# Patient Record
Sex: Female | Born: 1935 | Race: White | Hispanic: No | Smoking: Never smoker
Health system: Southern US, Community
[De-identification: ages and names within clinical notes are randomized; demographics above are authoritative.]

## PROBLEM LIST (undated history)

## (undated) DIAGNOSIS — F32A Depression, unspecified: Secondary | ICD-10-CM

## (undated) DIAGNOSIS — K219 Gastro-esophageal reflux disease without esophagitis: Secondary | ICD-10-CM

## (undated) DIAGNOSIS — Z9889 Other specified postprocedural states: Secondary | ICD-10-CM

## (undated) DIAGNOSIS — M353 Polymyalgia rheumatica: Secondary | ICD-10-CM

## (undated) DIAGNOSIS — T4145XA Adverse effect of unspecified anesthetic, initial encounter: Secondary | ICD-10-CM

## (undated) DIAGNOSIS — K5900 Constipation, unspecified: Secondary | ICD-10-CM

## (undated) DIAGNOSIS — M1990 Unspecified osteoarthritis, unspecified site: Secondary | ICD-10-CM

## (undated) DIAGNOSIS — T8859XA Other complications of anesthesia, initial encounter: Secondary | ICD-10-CM

## (undated) DIAGNOSIS — I10 Essential (primary) hypertension: Secondary | ICD-10-CM

## (undated) DIAGNOSIS — E039 Hypothyroidism, unspecified: Secondary | ICD-10-CM

## (undated) DIAGNOSIS — M810 Age-related osteoporosis without current pathological fracture: Secondary | ICD-10-CM

## (undated) DIAGNOSIS — F329 Major depressive disorder, single episode, unspecified: Secondary | ICD-10-CM

## (undated) DIAGNOSIS — R112 Nausea with vomiting, unspecified: Secondary | ICD-10-CM

## (undated) HISTORY — PX: TONSILLECTOMY: SUR1361

## (undated) HISTORY — PX: TRIGGER FINGER RELEASE: SHX641

## (undated) HISTORY — DX: Age-related osteoporosis without current pathological fracture: M81.0

## (undated) HISTORY — PX: NASAL SINUS SURGERY: SHX719

## (undated) HISTORY — PX: COLONOSCOPY W/ POLYPECTOMY: SHX1380

## (undated) HISTORY — DX: Major depressive disorder, single episode, unspecified: F32.9

## (undated) HISTORY — DX: Depression, unspecified: F32.A

## (undated) HISTORY — DX: Polymyalgia rheumatica: M35.3

## (undated) HISTORY — PX: UPPER GASTROINTESTINAL ENDOSCOPY: SHX188

## (undated) HISTORY — PX: EYE SURGERY: SHX253

---
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
## 1997-06-13 ENCOUNTER — Other Ambulatory Visit: Admission: RE | Admit: 1997-06-13 | Discharge: 1997-06-13 | Payer: Self-pay | Admitting: Gynecology

## 1998-06-17 ENCOUNTER — Other Ambulatory Visit: Admission: RE | Admit: 1998-06-17 | Discharge: 1998-06-17 | Payer: Self-pay | Admitting: Gynecology

## 1999-06-18 ENCOUNTER — Other Ambulatory Visit: Admission: RE | Admit: 1999-06-18 | Discharge: 1999-06-18 | Payer: Self-pay | Admitting: Gynecology

## 2000-06-27 ENCOUNTER — Other Ambulatory Visit: Admission: RE | Admit: 2000-06-27 | Discharge: 2000-06-27 | Payer: Self-pay | Admitting: Gynecology

## 2001-06-07 ENCOUNTER — Encounter: Payer: Self-pay | Admitting: Endocrinology

## 2001-06-07 ENCOUNTER — Encounter: Admission: RE | Admit: 2001-06-07 | Discharge: 2001-06-07 | Payer: Self-pay | Admitting: Endocrinology

## 2001-06-22 ENCOUNTER — Other Ambulatory Visit: Admission: RE | Admit: 2001-06-22 | Discharge: 2001-06-22 | Payer: Self-pay | Admitting: Gynecology

## 2001-08-01 ENCOUNTER — Encounter (INDEPENDENT_AMBULATORY_CARE_PROVIDER_SITE_OTHER): Payer: Self-pay | Admitting: *Deleted

## 2001-08-01 ENCOUNTER — Ambulatory Visit (HOSPITAL_COMMUNITY): Admission: RE | Admit: 2001-08-01 | Discharge: 2001-08-01 | Payer: Self-pay | Admitting: Rheumatology

## 2001-08-01 ENCOUNTER — Encounter: Payer: Self-pay | Admitting: Rheumatology

## 2001-08-07 ENCOUNTER — Encounter: Payer: Self-pay | Admitting: Rheumatology

## 2001-08-07 ENCOUNTER — Ambulatory Visit (HOSPITAL_COMMUNITY): Admission: RE | Admit: 2001-08-07 | Discharge: 2001-08-07 | Payer: Self-pay | Admitting: Rheumatology

## 2002-08-06 ENCOUNTER — Other Ambulatory Visit: Admission: RE | Admit: 2002-08-06 | Discharge: 2002-08-06 | Payer: Self-pay | Admitting: Gynecology

## 2003-01-04 ENCOUNTER — Ambulatory Visit (HOSPITAL_COMMUNITY): Admission: RE | Admit: 2003-01-04 | Discharge: 2003-01-04 | Payer: Self-pay | Admitting: Otolaryngology

## 2003-01-04 ENCOUNTER — Encounter (INDEPENDENT_AMBULATORY_CARE_PROVIDER_SITE_OTHER): Payer: Self-pay | Admitting: *Deleted

## 2003-01-04 ENCOUNTER — Ambulatory Visit (HOSPITAL_BASED_OUTPATIENT_CLINIC_OR_DEPARTMENT_OTHER): Admission: RE | Admit: 2003-01-04 | Discharge: 2003-01-04 | Payer: Self-pay | Admitting: Otolaryngology

## 2004-12-16 ENCOUNTER — Encounter: Admission: RE | Admit: 2004-12-16 | Discharge: 2005-01-08 | Payer: Self-pay | Admitting: Rheumatology

## 2005-03-01 HISTORY — PX: SHOULDER SURGERY: SHX246

## 2005-03-01 HISTORY — PX: SHOULDER ARTHROSCOPY: SHX128

## 2005-04-09 ENCOUNTER — Ambulatory Visit: Payer: Self-pay | Admitting: Internal Medicine

## 2005-04-27 ENCOUNTER — Encounter (INDEPENDENT_AMBULATORY_CARE_PROVIDER_SITE_OTHER): Payer: Self-pay | Admitting: Specialist

## 2005-04-27 ENCOUNTER — Ambulatory Visit: Payer: Self-pay | Admitting: Internal Medicine

## 2005-04-27 ENCOUNTER — Encounter (INDEPENDENT_AMBULATORY_CARE_PROVIDER_SITE_OTHER): Payer: Self-pay | Admitting: *Deleted

## 2005-10-14 ENCOUNTER — Encounter (INDEPENDENT_AMBULATORY_CARE_PROVIDER_SITE_OTHER): Payer: Self-pay | Admitting: Specialist

## 2005-10-14 ENCOUNTER — Ambulatory Visit (HOSPITAL_BASED_OUTPATIENT_CLINIC_OR_DEPARTMENT_OTHER): Admission: RE | Admit: 2005-10-14 | Discharge: 2005-10-15 | Payer: Self-pay | Admitting: Orthopedic Surgery

## 2006-10-14 ENCOUNTER — Emergency Department (HOSPITAL_COMMUNITY): Admission: EM | Admit: 2006-10-14 | Discharge: 2006-10-14 | Payer: Self-pay | Admitting: Family Medicine

## 2009-05-09 ENCOUNTER — Ambulatory Visit (HOSPITAL_COMMUNITY): Admission: RE | Admit: 2009-05-09 | Discharge: 2009-05-09 | Payer: Self-pay | Admitting: Rheumatology

## 2009-07-30 ENCOUNTER — Encounter: Admission: RE | Admit: 2009-07-30 | Discharge: 2009-07-30 | Payer: Self-pay | Admitting: Neurosurgery

## 2009-08-06 ENCOUNTER — Encounter (INDEPENDENT_AMBULATORY_CARE_PROVIDER_SITE_OTHER): Payer: Self-pay | Admitting: *Deleted

## 2009-09-19 ENCOUNTER — Ambulatory Visit: Payer: Self-pay | Admitting: Internal Medicine

## 2009-09-19 DIAGNOSIS — Z860100 Personal history of colon polyps, unspecified: Secondary | ICD-10-CM | POA: Insufficient documentation

## 2009-09-19 DIAGNOSIS — K644 Residual hemorrhoidal skin tags: Secondary | ICD-10-CM | POA: Insufficient documentation

## 2009-09-19 DIAGNOSIS — K5900 Constipation, unspecified: Secondary | ICD-10-CM | POA: Insufficient documentation

## 2009-09-19 DIAGNOSIS — R1319 Other dysphagia: Secondary | ICD-10-CM | POA: Insufficient documentation

## 2009-09-19 DIAGNOSIS — K219 Gastro-esophageal reflux disease without esophagitis: Secondary | ICD-10-CM | POA: Insufficient documentation

## 2009-09-29 ENCOUNTER — Telehealth: Payer: Self-pay | Admitting: Internal Medicine

## 2009-10-07 ENCOUNTER — Telehealth: Payer: Self-pay | Admitting: Internal Medicine

## 2009-12-01 ENCOUNTER — Ambulatory Visit: Payer: Self-pay | Admitting: Internal Medicine

## 2009-12-02 ENCOUNTER — Encounter: Payer: Self-pay | Admitting: Internal Medicine

## 2010-04-02 NOTE — Letter (Signed)
Summary: [facility] Instructions  [facility]  [address] [place], [place] [address]   Phone: [phone]  Fax: [phone]       Gloria Anderson    1933-11-21    MRN: [id number]        Procedure Day /Date:MONDAY, 11/17/09     Arrival Time:9:30 AM     Procedure Time:10:30 AM     Location of Procedure:                    X  [facility] (4th Floor)                        PREPARATION FOR COLONOSCOPY WITH MOVIPREP/ENDO   Starting 5 days prior to your procedure 9/14/11do not eat nuts, seeds, popcorn, corn, beans, peas,  salads, or any raw vegetables.  Do not take any fiber supplements (e.g. Metamucil, Citrucel, and Benefiber).  THE DAY BEFORE YOUR PROCEDURE         DATE:11/16/09 DAY: SUNDAY  1.  Drink clear liquids the entire day-NO SOLID FOOD  2.  Do not drink anything colored red or purple.  Avoid juices with pulp.  No orange juice.  3.  Drink at least 64 oz. (8 glasses) of fluid/clear liquids during the day to prevent dehydration and help the prep work efficiently.  CLEAR LIQUIDS INCLUDE: Water Jello Ice Popsicles Tea (sugar ok, no milk/cream) Powdered fruit flavored drinks Coffee (sugar ok, no milk/cream) Gatorade Juice: apple, white grape, white cranberry  Lemonade Clear bullion, consomm, broth Carbonated beverages (any kind) Strained chicken noodle soup Hard Candy                             4.  In the morning, mix first dose of MoviPrep solution:    Empty 1 Pouch A and 1 Pouch B into the disposable container    Add lukewarm drinking water to the top line of the container. Mix to dissolve    Refrigerate (mixed solution should be used within 24 hrs)  5.  Begin drinking the prep at 5:00 p.m. The MoviPrep container is divided by 4 marks.   Every 15 minutes drink the solution down to the next mark (approximately 8 oz) until the full liter is complete.   6.  Follow completed prep with 16 oz of clear liquid of your choice (Nothing  red or purple).  Continue to drink clear liquids until bedtime.  7.  Before going to bed, mix second dose of MoviPrep solution:    Empty 1 Pouch A and 1 Pouch B into the disposable container    Add lukewarm drinking water to the top line of the container. Mix to dissolve    Refrigerate  THE DAY OF YOUR PROCEDURE      DATE: 11/17/09 DAY: MONDAY  Beginning at 5:30 a.m. (5 hours before procedure):         1. Every 15 minutes, drink the solution down to the next mark (approx 8 oz) until the full liter is complete.  2. Follow completed prep with 16 oz. of clear liquid of your choice.    3. You may drink clear liquids until 8:30 AM (2 HOURS BEFORE PROCEDURE).   MEDICATION INSTRUCTIONS  Unless otherwise instructed, you should take regular prescription medications with a small sip of water   as early as possible the morning of your procedure.  OTHER INSTRUCTIONS  You will need a responsible adult at least 75 years of age to accompany you and drive you home.   This person must remain in the waiting room during your procedure.  Wear loose fitting clothing that is easily removed.  Leave jewelry and other valuables at home.  However, you may wish to bring a book to read or  an iPod/MP3 player to listen to music as you wait for your procedure to start.  Remove all body piercing jewelry and leave at home.  Total time from sign-in until discharge is approximately 2-3 hours.  You should go home directly after your procedure and rest.  You can resume normal activities the  day after your procedure.  The day of your procedure you should not:   Drive   Make legal decisions   Operate machinery   Drink alcohol   Return to work  You will receive specific instructions about eating, activities and medications before you leave.    The above instructions have been reviewed and explained to me by   _______________________    I fully understand and can verbalize these  instructions _____________________________ Date _________

## 2010-04-02 NOTE — Procedures (Signed)
Summary: Colonoscopy   Colonoscopy  Procedure date:  04/27/2005  Findings:      Location:  [facility].  Results: Polyp.  Tubular Adenoma Results: Hemorrhoids.       Procedures Next Due Date:    Colonoscopy: 04/2010  Colonoscopy  Procedure date:  04/27/2005  Findings:      Location:  [facility].  Results: Polyp.  Tubular Adenoma Results: Hemorrhoids.       Procedures Next Due Date:    Colonoscopy: 04/2010 Patient Name: Gloria, Anderson MRN:  Procedure Procedures: Colonoscopy CPT: 55732.    with polypectomy. CPT: A3573898.  Personnel: Endoscopist: Wilhemina Bonito. Marina Goodell, MD.  Referred By: Adrian Prince, MD.  Exam Location: Exam performed in Outpatient Clinic. Outpatient  Patient Consent: Procedure, Alternatives, Risks and Benefits discussed, consent obtained, from patient. Consent was obtained by the RN.  Indications  Average Risk Screening Routine.  History  Current Medications: Patient is not currently taking Coumadin.  Pre-Exam Physical: Performed Apr 27, 2005. Cardio-pulmonary exam, Rectal exam, Abdominal exam, Mental status exam WNL.  Comments: Pt. history reviewed/updated, physical exam performed prior to initiation of sedation? yes Exam Exam: Extent of exam reached: Cecum, extent intended: Cecum.  The cecum was identified by appendiceal orifice and IC valve. Patient position: on left side. The Cecum was reached at 8:28 AM. ended at 8:40 AM. Time for Withdrawl: 00:12. Colon retroflexion performed. Images taken. ASA Classification: II. Tolerance: excellent.  Monitoring: Pulse and BP monitoring, Oximetry used. Supplemental O2 given.  Colon Prep Used Miralax for colon prep. Prep results: excellent.  Sedation Meds: Patient assessed and found to be appropriate for moderate (conscious) sedation. Fentanyl 75 mcg. given IV. Versed 6 mg. given IV.  Findings NORMAL EXAM: Cecum to Rectum.  MULTIPLE POLYPS: Cecum to Sigmoid Colon.  minimum size 1 mm, maximum size 4 mm. Procedure:  snare without cautery, removed, Polyp retrieved, 3 polyps Polyps sent to pathology. ICD9: Colon Polyps: 211.3. Comments: 5 seen and removed (cecum 20mm,2mm,3mm; trans 2mm; sig 4mm).   Assessment  Diagnoses: 211.3: Colon Polyps.  455.0: Hemorrhoids, Internal.   Events  Unplanned Interventions: No intervention was required.  Unplanned Events: There were no complications. Plans Disposition: After procedure patient sent to recovery. After recovery patient sent home.  Scheduling/Referral: Colonoscopy, to Wilhemina Bonito. Marina Goodell, MD, in 5 years,    This report was created from the original endoscopy report, which was reviewed and signed by the above listed endoscopist.   cc:  Adrian Prince, MD      The Patient

## 2010-04-02 NOTE — Letter (Signed)
Summary: New Patient letter  [facility]  [address] [place], [place] [address]   Phone: [phone]  Fax: [phone]       08/06/2009 MRN: [id number]  Gloria Anderson [address] [place], [place]  [address]  Dear Gloria Anderson,  Welcome to the Gastroenterology Division at [facility].    You are scheduled to see Dr. Marina Goodell on 09/19/2009 at 1:45PM on the 3rd floor at [facility], [address]  We ask that you try to arrive at our office 15 minutes prior to your appointment time to allow for check-in.  We would like you to complete the enclosed self-administered evaluation form prior to your visit and bring it with you on the day of your appointment.  We will review it with you.  Also, please bring a complete list of all your medications or, if you prefer, bring the medication bottles and we will list them.  Please bring your insurance card so that we may make a copy of it.  If your insurance requires a referral to see a specialist, please bring your referral form from your primary care physician.  Co-payments are due at the time of your visit and may be paid by cash, check or credit card.     Your office visit will consist of a consult with your physician (includes a physical exam), any laboratory testing he/she may order, scheduling of any necessary diagnostic testing (e.g. x-ray, ultrasound, CT-scan), and scheduling of a procedure (e.g. Endoscopy, Colonoscopy) if required.  Please allow enough time on your schedule to allow for any/all of these possibilities.    If you cannot keep your appointment, please call [phone] to cancel or reschedule prior to your appointment date.  This allows [place] the opportunity to schedule an appointment for another patient in need of care.  If you do not cancel or reschedule by 5 p.m. the business day prior to your appointment date, you will be charged a $50.00 late cancellation/no-show fee.    Thank you for  choosing [facility] for your medical needs.  We appreciate the opportunity to care for you.  Please visit [place] at our website  to learn more about our practice.                     Sincerely,                                                             The Gastroenterology Division

## 2010-04-02 NOTE — Procedures (Signed)
Summary: Upper Endoscopy  Patient: [name] Note: All result statuses are Final unless otherwise noted.  Tests: (1) Upper Endoscopy (EGD)   EGD Upper Endoscopy       DONE     [facility]     [address]     [place], [place]  [address]           ENDOSCOPY PROCEDURE REPORT           PATIENT:  Gloria Anderson, Rottman  MR#:  [id number]     BIRTHDATE:  April 26, 1933, 75 yrs. old  GENDER:  female           ENDOSCOPIST:  Wilhemina Bonito. Eda Keys, MD     Referred by:  Office           PROCEDURE DATE:  12/01/2009     PROCEDURE:  EGD, diagnostic     ASA CLASS:  Class II     INDICATIONS:  vague dysphagia,scratchy feeling in throat and raspy     voice (all improved or resolved on PPI initiated 09-19-09); GERD           MEDICATIONS:   There was residual sedation effect present from     prior procedure., Fentanyl 25 mcg IV, Versed 2 mg IV     TOPICAL ANESTHETIC:  Exactacain Spray           DESCRIPTION OF PROCEDURE:   After the risks benefits and     alternatives of the procedure were thoroughly explained, informed     consent was obtained.  The LB GIF-H180 [id number] endoscope was     introduced through the mouth and advanced to the second portion of     the duodenum, without limitations.  The instrument was slowly     withdrawn as the mucosa was fully examined.     <<PROCEDUREIMAGES>>           The upper, middle, and distal third of the esophagus were     carefully inspected and no abnormalities were noted. The z-line     was well seen at the GEJ. The endoscope was pushed into the fundus     which was normal including a retroflexed view. The antrum,gastric     body, first and second part of the duodenum were unremarkable.     Retroflexed views revealed no abnormalities.    The scope was then     withdrawn from the patient and the procedure completed.           COMPLICATIONS:  None           ENDOSCOPIC IMPRESSION:     1) Normal EGD     2) GERD     RECOMMENDATIONS:     1)  Anti-reflux regimen to be followed     2) Continue Omeprazole     3) follow up as needed           ______________________________     Wilhemina Bonito. Eda Keys, MD           CC:  Adrian Prince, MD, The Patient           n.     eSIGNED:   Wilhemina Bonito. Eda Keys at 12/01/2009 10:47 AM           Sheffer, Randa Evens, [id number]  Note: An exclamation mark (!) indicates a result that was not dispersed into the flowsheet. Document Creation Date: 12/01/2009 10:48 AM _______________________________________________________________________  (1) Order result status: Final Collection or  observation date-time: 12/01/2009 10:40 Requested date-time:  Receipt date-time:  Reported date-time:  Referring Physician:   Ordering Physician: Fransico Setters [phone]) Specimen Source:  Source: Launa Grill Order Number: [id number] Lab site:

## 2010-04-02 NOTE — Letter (Signed)
Summary: Patient Notice- Polyp Results  [facility]  [address] [place], [place] [address]   Phone: [phone]  Fax: [phone]        December 02, 2009 MRN: [id number]    CYRAH MCLAMB [address] [place], [place]  [address]    Dear Ms. Rahe,  I am pleased to inform you that the colon polyp(s) removed during your recent colonoscopy was (were) found to be benign (no cancer detected) upon pathologic examination.  I recommend you have a repeat colonoscopy examination in 5 years to look for recurrent polyps, as having colon polyps increases your risk for having recurrent polyps or even colon cancer in the future.  Should you develop new or worsening symptoms of abdominal pain, bowel habit changes or bleeding from the rectum or bowels, please schedule an evaluation with either your primary care physician or with me.  Additional information/recommendations:  __ No further action with gastroenterology is needed at this time. Please      follow-up with your primary care physician for your other healthcare      needs.  _ Please call us if you are having persistent problems or have questions about your condition that have not been fully answered at this time.  Sincerely,  Hilarie Fredrickson MD  This letter has been electronically signed by your physician.  Appended Document: Patient Notice- Polyp Results letter mailed

## 2010-04-02 NOTE — Assessment & Plan Note (Signed)
Summary: DYSPHAGIA, PILLS SPECIFICALLY.Marland Kitchen   History of Present Illness Visit Type: Initial Consult Primary GI MD: Yancey Flemings MD Primary Provider: Adrian Prince, MD Requesting Provider: Adrian Prince, MD Chief Complaint: dysphagia, specifically pills;also a sore scratchy throat, hoarseness x 4 months History of Present Illness:   77 year old female with polymyalgia rheumatica, hypertension, hypothyroidism, and adenomatous colon polyps. She is sent today by Dr. Evlyn Kanner regarding laryngeal complaints, dysphagia, and the need for upper endoscopy. She reports being in her usual state of health until March 2011 when she began to notice a sensation of swollen glands in her neck. She was seen by 2 physicians who could not confirm such. Since that time she has also noticed intermittent dysphagia to pills, and the cervical region, as well as intermittent raspiness of her voice. She also notices intermittently, left ear pain. She has experienced several episodes of rather severe reflux. She is on no regular reflux medications. She has lost 4-5 pounds over the past month, but she states this is intentional as she is planning a trip to [place]. The patient also has a history of multiple adenomatous colon polyps on index exam in February 2007. She is due for followup in 6 months. She asks to have this performed concurrent with endoscopy, if endoscopy needed. Next, she mentions problems with constipation which she manages with stool softeners. She wonders if this is okay. She also mentions problems with external hemorrhoids occasionally. Specifically, irritation and minor bleeding. She has use Preparation H with variable results. She requires about other therapies.   GI Review of Systems    Reports acid reflux, bloating, dysphagia with solids, heartburn, and  weight loss.      Denies abdominal pain, belching, chest pain, dysphagia with liquids, loss of appetite, nausea, vomiting, vomiting blood, and  weight gain.     Reports constipation, hemorrhoids, and  rectal bleeding.     Denies anal fissure, black tarry stools, change in bowel habit, diarrhea, diverticulosis, fecal incontinence, heme positive stool, irritable bowel syndrome, jaundice, light color stool, liver problems, and  rectal pain. Preventive Screening-Counseling & Management  Alcohol-Tobacco     Smoking Status: never      Drug Use:  no.      Current Medications (verified): 1)  Multivitamins  Tabs (Multiple Vitamin) .Marland Kitchen.. 1 By Mouth Once Daily 2)  Synthroid 50 Mcg Tabs (Levothyroxine Sodium) .Marland Kitchen.. 1 Tablet By Mouth Once Daily 3)  Prednisone 5 Mg Tabs (Prednisone) .... As Directed 4)  Aspirin 81 Mg Tbec (Aspirin) .Marland Kitchen.. 1 By Mouth Once Daily 5)  Plaquenil 200 Mg Tabs (Hydroxychloroquine Sulfate) .Marland Kitchen.. 1 Tablet By Mouth Once Daily 6)  Fish Oil 1000 Mg Caps (Omega-3 Fatty Acids) .Marland Kitchen.. 1 By Mouth Once Daily 7)  Magnesium 250 Mg Tabs (Magnesium) .Marland Kitchen.. 1 Tablet By Mouth Once Daily 8)  Vitamin D 1000 Unit Tabs (Cholecalciferol) .Marland Kitchen.. 1 By Mouth Once Daily 9)  Calcium 600 Mg Tabs (Calcium) .Marland Kitchen.. 1 By Mouth Two Times A Day 10)  Benazepril Hcl 10 Mg Tabs (Benazepril Hcl) .... Once Daily 11)  Plaquenil 200 Mg Tabs (Hydroxychloroquine Sulfate) .... Take 1 Tablet  Every Morning and 1/2  Every Evening 12)  Garlic Oil 2000 Mg Caps (Garlic) .... Once Daily 13)  Stool Softener 100 Mg Caps (Docusate Sodium) .... Take 2 Caps Daily 14)  Glucosamine 500 Mg Caps (Glucosamine Sulfate) .... Take 1500 Mg Once Daily 15)  Advil Pm 200-38 Mg Tabs (Ibuprofen-Diphenhydramine Cit) .... As Needed  Allergies (verified): No Known Drug Allergies  Past History:  Past Medical History: Reviewed history from 09/17/2009 and no changes required. Hemorrhoids Colon Polyps-Tubular Adenoma Anemia Hypothyroidism Hypertension Polymalgia Rheumatica  Past Surgical History: Cataract Extraction Rotator Cuff Repair Sinus Surgery  Family History: Lung cancer: Brother Family  History of Diabetes: Brother TB: Mother Family History of Heart Disease: Father  Social History: Occupation: Retired Patient has never smoked.  Alcohol Use - no Illicit Drug Use - no Smoking Status:  never Drug Use:  no  Review of Systems       The patient complains of back pain and voice change.  The patient denies allergy/sinus, anemia, anxiety-new, arthritis/joint pain, blood in urine, breast changes/lumps, change in vision, confusion, cough, coughing up blood, depression-new, fainting, fatigue, fever, headaches-new, hearing problems, heart murmur, heart rhythm changes, itching, menstrual pain, muscle pains/cramps, night sweats, nosebleeds, pregnancy symptoms, shortness of breath, skin rash, sleeping problems, sore throat, swelling of feet/legs, swollen lymph glands, thirst - excessive , urination - excessive , urination changes/pain, urine leakage, and vision changes.    Vital Signs:  Patient profile:   77 year old female Height:      64 inches Weight:      124 pounds BMI:     21.36 Pulse rate:   88 / minute Pulse rhythm:   regular BP sitting:   120 / 60  (left arm) Cuff size:   regular  Vitals Entered By: June McMurray CMA Duncan Dull) (September 19, 2009 1:40 PM)  Physical Exam  General:  Well developed, well nourished, no acute distress. Head:  Normocephalic and atraumatic. Eyes:  PERRLA, no icterus. Ears:  Normal auditory acuity. Nose:  No deformity, discharge,  or lesions. Mouth:  No deformity or lesions Neck:  Supple; no masses or thyromegaly. Lungs:  Clear throughout to auscultation. Heart:  Regular rate and rhythm; no murmurs, rubs,  or bruits. Abdomen:  Soft, nontender and nondistended. No masses, hepatosplenomegaly or hernias noted. Normal bowel sounds. Rectal:  deferred until colonoscopy Msk:  Symmetrical with no gross deformities. Normal posture. Pulses:  Normal pulses noted. Extremities:  No clubbing, cyanosis, edema or deformities noted. Neurologic:  Alert and   oriented x4 Skin:  Intact without significant lesions or rashes. Cervical Nodes:  No significant cervical adenopathy.no supraclavicular adenopathy Psych:  Alert and cooperative. Normal mood and affect.   Impression & Recommendations:  Problem # 1:  DYSPHAGIA (JXB-147.82) minor intermittent pill dysphagia. Rule out stricture  Plan: #1. Upper endoscopy. The nature of the procedure as well as the risks, benefits, and alternatives have been reviewed. She understood and agreed to proceed  Problem # 2:  GERD (ICD-530.81) intermittent GERD symptoms. This could possibly explain intermittent problems with raspy voice.  Plan: #1. Initiate empiric PPI therapy. Samples of AcipHex 20 mg daily given #2. Upper endoscopy #3. If symptoms persist despite PPI and upper endoscopy negative, consider ENT evaluation given ear pain and raspy voice  Problem # 3:  CONSTIPATION (ICD-564.00) functional constipation ongoing  Plan: #1. Increase dietary fiber and water #2. Okay to use stool softeners  Problem # 4:  HEMORRHOIDS-EXTERNAL (ICD-455.3) intermittent problems with hemorrhoids as manifested by irritation minor bleeding.  Plan: #1. Brochure on hemorrhoidal care #2. Increase fiber #3. Prescribe Analpram cream p.r.n.  Problem # 5:  PERSONAL HISTORY OF COLONIC POLYPS (ICD-V12.72) personal history of adenomatous colon polyps. Just about due for routine followup. We will allow her to schedule her colonoscopy along with her upper endoscopy for her convenience. The nature of colonoscopy as well as risks, benefits, and  alternatives were reviewed. She understood and agreed to proceed. Movi prep prescribed. The patient instructed on its use  Other Orders: Colon/Endo (Colon/Endo)  Patient Instructions: 1)  Colon/Endo  LEC 11/17/09 10:30 am 2)  Arrive at 9:30 am 3)  Movi prep instructions given. 4)  Movi prep Rx. sent to pharmacy. 5)  Colonoscopy and Flexible Sigmoidoscopy brochure given.  6)   Hemorrhoids brochure given.  7)  Upper Endoscopy brochure given.  8)  Copy sent to : Adrian Prince, MD 9)  analpram cream to apply to rectum two times a day  Rx. sent to pharmacy. 10)  The medication list was reviewed and reconciled.  All changed / newly prescribed medications were explained.  A complete medication list was provided to the patient / caregiver. Prescriptions: ANALPRAM E 2.5-1 & 1 % KIT (HYDROCORTISONE ACE-PRAMOXINE) apply to rectum two times a day  #1 x 0   Entered by:   Milford Cage NCMA   Authorized by:   Hilarie Fredrickson MD   Signed by:   Milford Cage NCMA on 09/19/2009   Method used:   Electronically to        [facility]* (retail)       [address]       [place], [place]  [id number]       Ph: [phone]       Fax: [phone]   RxID:   [id number] MOVIPREP 100 GM  SOLR (PEG-KCL-NACL-NASULF-NA ASC-C) As per prep instructions.  #1 x 0   Entered by:   Milford Cage NCMA   Authorized by:   Hilarie Fredrickson MD   Signed by:   Milford Cage NCMA on 09/19/2009   Method used:   Electronically to        [facility]* (retail)       [address]       [place], [place]  [id number]       Ph: [phone]       Fax: [phone]   RxID:   [id number]

## 2010-04-02 NOTE — Progress Notes (Signed)
Summary: Medication  Phone Note Call from Patient Call back at Work Phone [phone]   Caller: Patient Call For: Dr. Marina Goodell Reason for Call: Talk to Nurse Summary of Call: pt. ran out of her Aciphex...does he want her to continue taking the meds. She is leaving for [place] in a few days and her symptoms are returning Initial call taken by: Karna Christmas,  October 07, 2009 12:51 PM  Follow-up for Phone Call        Pt. is going to use Prilosec OTC while on her trip as she says Aciphex is too expensive .Says she wiil see what her endo shows and then if GERD confirmed she will decide if she wants to pay for Aciphex. Follow-up by: Teryl Lucy RN,  October 07, 2009 1:57 PM  Additional Follow-up for Phone Call Additional follow up Details #1::        ok Additional Follow-up by: Hilarie Fredrickson MD,  October 10, 2009 1:00 PM

## 2010-04-02 NOTE — Procedures (Signed)
Summary: Colonoscopy  Patient: Gloria Anderson Note: All result statuses are Final unless otherwise noted.  Tests: (1) Colonoscopy (COL)   COL Colonoscopy           DONE     [facility]     [address]     [place], [place]  [address]           COLONOSCOPY PROCEDURE REPORT           PATIENT:  Gloria Anderson, Gloria Anderson  MR#:  [id number]     BIRTHDATE:  10-10-1933, 75 yrs. old  GENDER:  female     ENDOSCOPIST:  Wilhemina Bonito. Eda Keys, MD     REF. BY:  Surveillance Program Recall,     PROCEDURE DATE:  12/01/2009     PROCEDURE:  Colonoscopy with snare polypectomy x 4     ASA CLASS:  Class II     INDICATIONS:  history of pre-cancerous (adenomatous) colon polyps,     surveillance and high-risk screening ; index exam 04-2005 w/ 5     small adenomas     MEDICATIONS:   Fentanyl 75 mcg IV, Versed 8 mg IV           DESCRIPTION OF PROCEDURE:   After the risks benefits and     alternatives of the procedure were thoroughly explained, informed     consent was obtained.  Digital rectal exam was performed and     revealed no abnormalities.   The LB CF-[id number] endoscope     was introduced through the anus and advanced to the cecum, which     was identified by both the appendix and ileocecal valve, without     limitations.Time to cecum = 3:09 min. The quality of the prep was     excellent, using MoviPrep.  The instrument was then slowly     withdrawn (time = 14:29 min) as the colon was fully examined.     <<PROCEDUREIMAGES>>           FINDINGS:  Four polyps were found - 1mm, 5mm in the cecum; 2mm     ascending; and 3mm transverse. Polyps were snared without cautery.     Retrieval was successful  in 3 of 4. Mild diverticulosis was found     in the sigmoid colon.  This was otherwise a normal examination of     the colon.   Retroflexed views in the rectum revealed moderate     internal hemorrhoids.    The scope was then withdrawn from the     patient and the procedure completed.         COMPLICATIONS:  None     ENDOSCOPIC IMPRESSION:     1) Four small polyps - removed     2) Mild diverticulosis in the sigmoid colon     3) Otherwise normal examination     4) Internal hemorrhoids           RECOMMENDATIONS:     1) Follow up colonoscopy in 5 years           ______________________________     Wilhemina Bonito. Eda Keys, MD           CC:  Adrian Prince, MD;The Patient           n.     Rosalie DoctorWilhemina Bonito. Eda Keys at 12/01/2009 10:35 AM           Terlecki, Randa Evens, [id number]  Note: An exclamation  mark (!) indicates a result that was not dispersed into the flowsheet. Document Creation Date: 12/01/2009 10:35 AM _______________________________________________________________________  (1) Order result status: Final Collection or observation date-time: 12/01/2009 10:28 Requested date-time:  Receipt date-time:  Reported date-time:  Referring Physician:   Ordering Physician: Fransico Setters [phone]) Specimen Source:  Source: Launa Grill Order Number: [id number] Lab site:   Appended Document: Colonoscopy     Procedures Next Due Date:    Colonoscopy: 11/2014

## 2010-04-02 NOTE — Progress Notes (Signed)
Summary: Triage  Phone Note Call from Patient Call back at Home Phone [phone]   Caller: Patient Call For: Dr. Marina Goodell Reason for Call: Talk to Nurse Summary of Call: pt.'s hemorroids is better...should she stop the analpram Initial call taken by: Karna Christmas,  September 29, 2009 9:35 AM  Follow-up for Phone Call        Says symptoms completly resolved  told it was ordered p.r.n. so she should stop cream and re-start if  symptoms re-occur. Follow-up by: Teryl Lucy RN,  September 29, 2009 10:22 AM

## 2010-07-17 NOTE — Op Note (Signed)
NAME:  Gloria Anderson, Gloria Anderson                      ACCOUNT NO.:  [id number]   MEDICAL RECORD NO.:  [id number]                   PATIENT TYPE:  AMB   LOCATION:  DSC                                  FACILITY:  [facility]   PHYSICIAN:  Christopher E. Ezzard Standing, M.D.         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   FINDINGS:  Consistent with right maxillary fungal sinusitis.   OPERATION:  Functional endoscopic sinus surgery with anterior right  ethmoidectomy, right maxillary ostial enlargement with removal  of fungal  debris from the right maxillary sinus.   SURGEON:  Kristine Garbe. Ezzard Standing, M.D.   ANESTHESIA:  General endotracheal anesthesia.   COMPLICATIONS:  None.   INDICATIONS FOR PROCEDURE:  Gloria Anderson is a 77 year old female  who  has had chronic  right-sided sinus discomfort  and pain over the last  several months with repeated CT scan. She has a completely opacified right  maxillary sinus and minimal opacification of the right anterior ethmoid  region. This has not been responsive to antibiotic therapy and she is taken  to the operating room at this time for endoscopic maxillary ostial  enlargement with drainage of right maxillary sinus and anterior ethmoid  region.   DESCRIPTION OF PROCEDURE:  After adequate endotracheal anesthesia the  patient received 1 gm of Ancef preoperatively as well as 80 mg of Decadron  IV preoperatively. Her nose was prepped with a cotton pledget soaked in  Afrin and the right middle meatus and the right middle turbinate were  injected with Xylocaine with epinephrine for hemostasis.   The middle turbinate was outfractured. The uncinate process was incised and  removed. The anterior  ethmoid region was opened up with straight-through cup  forceps and microdebrider. The maxillary sinus ostia had a purulent  discharge. The sinus ostia was enlarged with backbiting and straight-through  cup forceps.   On entering the right maxillary sinus the ostia had a purulent  discharge.  The sinus ostia was enlarged with backbiting and straight-through cup  forceps. On entering the right maxillary sinus there was mucopurulent  discharge as well as a very thickened polypoid mucosa and what appeared  to  be a very thick brown-green, peanut butter consistency debris within the  right maxillary sinus consistent with fungal sinusitis.   The maxillary ostia was enlarged to approximately 2 cm size and the fungal  debris was removed with suction as well as copious amounts of irrigation of  saline. After removing all of the fungal debris that could be visualized  with the 70 degree endoscope, the procedure was completed.   Hemostasis of some of the bleeding mucosal edges was obtained with suction  cautery. A Kennedy sinus pack was  placed within the anterior ethmoid region  and hydrated with Xylocaine with epinephrine for hemostasis. The patient was  awakened from anesthesia and transferred to the recovery room  postoperatively doing well.   DISPOSITION:  Gloria Anderson will follow up in my office in 4 days for recheck and  have the Kennedy sinus pack removed. She was given Keflex 500 mg b.i.d. x1  week, Tylenol or Tylenol #3 p.r.n. pain.                                               Kristine Garbe. Ezzard Standing, M.D.    CEN/MEDQ  D:  01/04/2003  T:  01/04/2003  Job:  [id number]   cc:   Jeannett Senior A. Evlyn Kanner, M.D.  [address]  [place]  [place] [address]  Fax: [phone]

## 2010-07-17 NOTE — Op Note (Signed)
NAME:  Gloria Anderson, Gloria Anderson          ACCOUNT NO.:  [id number]   MEDICAL RECORD NO.:  [id number]          PATIENT TYPE:  AMB   LOCATION:  DSC                          FACILITY:  [facility]   PHYSICIAN:  Katy Fitch. Sypher, M.D. DATE OF BIRTH:  09/04/1933   DATE OF PROCEDURE:  10/14/2005  DATE OF DISCHARGE:                                 OPERATIVE REPORT   PREOPERATIVE DIAGNOSES:  1. Chronic stage III impingement right shoulder.  2. 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  3. MRI-proven necrotic retracted rotator cuff tear of supraspinatus and      infraspinatus tendons.  4. Suspected labral tear and adhesive capsulitis.   POSTOPERATIVE DIAGNOSES:  1. Necrotic calcific degenerative rotator cuff tear involving      supraspinatus and infraspinatus tendons, mid substance.  2. Anterior labral tear.  3. Adhesive capsulitis.  4. Acromioclavicular arthropathy.  5. Unfavorable type 3 acromial anatomy.   OPERATION:  1. Diagnostic arthroscopy right glenohumeral joint with arthroscopic      debridement of adhesive capsulitis granulation tissue and labral      fragments.  2. Subacromial debridement.  3. Subacromial decompression with acromioplasty, coracoacromial ligament      resection and bursectomy.  4. Open resection of distal clavicle.  5. Reconstruction of right rotator cuff with biopsy of necrotic calcific      supraspinatus rotator cuff tendon followed by reconstruction by inset      of infraspinatus and supraspinatus into decorticated greater      tuberosity.   OPERATING SURGEON:  Katy Fitch. Sypher, M.D.   ASSISTANT:  None.   ANESTHESIA:  General endotracheal supplemented by a right interscalene  block.   SUPERVISING ANESTHESIOLOGIST:  Dr. Gelene Mink.   INDICATION:  Gloria Anderson is a 77 year old right-hand dominant woman  referred by Dr. Ardyth Harps for evaluation and management of multiple upper  extremity problems.  She is had CMC  arthrosis, trigger fingers and recently  developed a very severe right shoulder pain.   She was seen in the late spring of 2007 and was noted to have evidence of  calcific tendinopathy and unfavorable acromial anatomy.  We advised  proceeding with arthroscopic subacromial decompression and repair of rotator  cuff as findings dictated.   She was traveling to [place] in June and July 2007 and fell onto the  pavement with weight bearing on both upper extremities.  She developed  severe acute right shoulder pain.  Upon return to [facility], she was seen  on an urgent basis for evaluation of her right shoulder.  At that time, she  had marked weakness of abduction, pain beneath the acromion and deltoid  muscle.  A MRI of the shoulder was obtained an urgent basis revealing a  retracted necrotic rotator cuff tear involving the supraspinatus and  infraspinatus tendons.   Arrangements were to made for diagnostic arthroscopy, subacromial  decompression, distal clavicle resection and repair rotator cuff tear as  findings dictated at this time.   Preoperatively, she was advised of the potential risks and benefits of  surgery.  She understands the anesthetic risks from discussion  with Dr.  Gelene Mink.  She understands that she will require 12 weeks' of significant  rehabilitation and up to 6-8 months to see maximum benefit of surgery.   After questions were invited and answered with Gloria Anderson and her son,  she is brought to the operating room at this time.   PROCEDURE:  Gloria Anderson is brought to the operating room and placed  in supine position on the operating table.  Following placement of an  interscalene block by Dr. Gelene Mink in the holding area, excellent  anesthesia of the right shoulder and forequarter was obtained.   One gram of Ancef was administered as IV prophylactic antibiotic followed by  induction of general endotracheal anesthesia and careful positioning in  the  beach-chair position with the aid of a torso and head holder designed from  shoulder arthroscopy.  Examination of the right shoulder under anesthesia  revealed elevation to 160, external rotation of 80, internal rotation of 60,  limited by mild adhesive capsulitis.   The arm was then prepped with DuraPrep and draped with impervious prostate  drapes.  The shoulder was distended with 20 mL of sterile saline followed by  introduction of the arthroscope through a standard posterior portal with  blunt technique.  Diagnostic arthroscopy revealed a necrotic mid substance  tear of the rotator cuff extending to the biceps tendon anteriorly,  posteriorly approximately 4 cm.  There was considerable adhesive capsulitis  tissues present anteriorly and on the deep surface of the supraspinatus  tendon.  An anterior portal was created under direct vision and a suction  shaver was used to debride the labrum and granulation tissues.  The scope  was then placed in subacromial space.  There was florid bursitis noted.  We  then proceeded directly to open distal clavicle resection and  repair/reconstruction of the rotator cuff.   A 4 cm incision was fashioned across the anterior acromion from the distal  clavicle to the anterior middle third deltoid junction.  The anterior third  of deltoid was elevated sharply off of the acromion and a very significant  type 3 acromial spur noted.  Hemostasis achieved in the coracoacromial  ligament with the cutting cautery followed by identification of the distal  clavicle and subperiosteal exposure of the distal 12 mm of the clavicle.  The clavicle was removed with an oscillating saw and the osteophyte on the  medial margin of the acromion removed with a rongeur.  The acromion was  leveled to a type 1 morphology with an oscillating saw, hand rasp and power  bur.  After bursectomy was accomplished, the rotator cuff was thoroughly debrided of all necrotic and calcific  tendons.  A portion of the  supraspinatus was significantly calcified.  This could not be sacrifice  otherwise repair would not be possible.  Therefore we decorticated the  greater tuberosity at the footprint of the infraspinatus and supraspinatus  followed by placing a grasping suture of #2 FiberWire in the supraspinatus  and advancing it anatomically into the bone trough.  Two Bio-Corkscrew  anchors were used to create a medial footprint, one for the infraspinatus  and one for the supraspinatus to decorticated greater tuberosity.  A very  satisfactory low profile repair was achieved.  This was finished with  mattress suture of zero Vicryl.   The deltoid was then repaired to the trapezius closing the dead space  created by distal clavicle resection and the deltoid was repaired anteriorly  to the periosteum and through bone of  the anterior acromion.  A very stout  repair was achieved.  The wound was then repaired with subdermal sutures of  2-0 Vicryl, 3-0 Vicryl and intradermal 3-0 Prolene.  The portals were closed  with 3-0 Vicryl mattress sutures.  Steri-Strips were applied to the primary  wound.  The wound was then dressed with sterile gauze and Hypafix.  Ms.  Anderson will be [facility] to [facility] for observation of her  vital signs.   She will be discharged in 24 hours with prescriptions for Dilaudid 2 mg one  or two tablets p.o. q.4-6 h p.r.n. pain, a total of 40 tablets without  refill.  Also Motrin 600 mg one p.o. q.6 h p.r.n. pain and Keflex 500 mg one  p.o. q.8 h x4 days as prophylactic antibiotic.      Katy Fitch Sypher, M.D.  Electronically Signed     RVS/MEDQ  D:  10/14/2005  T:  10/14/2005  Job:  [id number]

## 2011-03-11 ENCOUNTER — Other Ambulatory Visit: Payer: Self-pay | Admitting: Orthopedic Surgery

## 2011-03-17 ENCOUNTER — Encounter (HOSPITAL_BASED_OUTPATIENT_CLINIC_OR_DEPARTMENT_OTHER): Payer: Self-pay | Admitting: *Deleted

## 2011-03-17 NOTE — Progress Notes (Signed)
To come in for [id number] to bring dos and stay post op

## 2011-03-18 ENCOUNTER — Other Ambulatory Visit: Payer: Self-pay

## 2011-03-18 ENCOUNTER — Encounter (HOSPITAL_BASED_OUTPATIENT_CLINIC_OR_DEPARTMENT_OTHER)
Admission: RE | Admit: 2011-03-18 | Discharge: 2011-03-18 | Disposition: A | Discharge status: Home / Self Care | Payer: Medicare Other | Source: Ambulatory Visit | Attending: Orthopedic Surgery | Admitting: Orthopedic Surgery

## 2011-03-18 LAB — BASIC METABOLIC PANEL
BUN: 23 mg/dL (ref 6–23)
CO2: 27 mEq/L (ref 19–32)
Calcium: 9.6 mg/dL (ref 8.4–10.5)
Chloride: 104 mEq/L (ref 96–112)
Creatinine, Ser: 0.8 mg/dL (ref 0.50–1.10)
GFR calc Af Amer: 82 mL/min — ABNORMAL LOW (ref >90)
GFR calc non Af Amer: 70 mL/min — ABNORMAL LOW (ref >90)
Glucose, Bld: 121 mg/dL — ABNORMAL HIGH (ref 70–99)
Potassium: 4 mEq/L (ref 3.5–5.1)
Sodium: 140 mEq/L (ref 135–145)

## 2011-03-19 ENCOUNTER — Ambulatory Visit (HOSPITAL_BASED_OUTPATIENT_CLINIC_OR_DEPARTMENT_OTHER)
Admission: RE | Admit: 2011-03-19 | Discharge: 2011-03-19 | Disposition: A | Discharge status: Home / Self Care | Payer: Medicare Other | Source: Ambulatory Visit | Attending: Orthopedic Surgery | Admitting: Orthopedic Surgery

## 2011-03-19 ENCOUNTER — Encounter (HOSPITAL_BASED_OUTPATIENT_CLINIC_OR_DEPARTMENT_OTHER): Payer: Self-pay | Admitting: Anesthesiology

## 2011-03-19 ENCOUNTER — Encounter (HOSPITAL_BASED_OUTPATIENT_CLINIC_OR_DEPARTMENT_OTHER)
Admission: RE | Disposition: A | Discharge status: Home / Self Care | Payer: Self-pay | Source: Ambulatory Visit | Attending: Orthopedic Surgery

## 2011-03-19 ENCOUNTER — Ambulatory Visit (HOSPITAL_BASED_OUTPATIENT_CLINIC_OR_DEPARTMENT_OTHER): Payer: Medicare Other | Admitting: Anesthesiology

## 2011-03-19 ENCOUNTER — Encounter (HOSPITAL_BASED_OUTPATIENT_CLINIC_OR_DEPARTMENT_OTHER): Payer: Self-pay | Admitting: *Deleted

## 2011-03-19 DIAGNOSIS — E039 Hypothyroidism, unspecified: Secondary | ICD-10-CM | POA: Insufficient documentation

## 2011-03-19 DIAGNOSIS — M6530 Trigger finger, unspecified finger: Secondary | ICD-10-CM | POA: Insufficient documentation

## 2011-03-19 DIAGNOSIS — K219 Gastro-esophageal reflux disease without esophagitis: Secondary | ICD-10-CM | POA: Insufficient documentation

## 2011-03-19 DIAGNOSIS — I10 Essential (primary) hypertension: Secondary | ICD-10-CM | POA: Insufficient documentation

## 2011-03-19 DIAGNOSIS — Z01810 Encounter for preprocedural cardiovascular examination: Secondary | ICD-10-CM | POA: Insufficient documentation

## 2011-03-19 DIAGNOSIS — Z01812 Encounter for preprocedural laboratory examination: Secondary | ICD-10-CM | POA: Insufficient documentation

## 2011-03-19 HISTORY — DX: Essential (primary) hypertension: I10

## 2011-03-19 HISTORY — PX: TRIGGER FINGER RELEASE: SHX641

## 2011-03-19 HISTORY — DX: Unspecified osteoarthritis, unspecified site: M19.90

## 2011-03-19 HISTORY — DX: Hypothyroidism, unspecified: E03.9

## 2011-03-19 HISTORY — DX: Gastro-esophageal reflux disease without esophagitis: K21.9

## 2011-03-19 LAB — POCT HEMOGLOBIN-HEMACUE: Hemoglobin: 10 g/dL — ABNORMAL LOW (ref 12.0–15.0)

## 2011-03-19 SURGERY — RELEASE, A1 PULLEY, FOR TRIGGER FINGER
Anesthesia: Monitor Anesthesia Care | Site: Finger | Laterality: Right | Wound class: Clean

## 2011-03-19 MED ORDER — METHYLPREDNISOLONE ACETATE PF 40 MG/ML IJ SUSP
INTRAMUSCULAR | Status: DC | PRN
  Administered 2011-03-19: 40 mg via INTRA_ARTICULAR

## 2011-03-19 MED ORDER — FENTANYL CITRATE 0.05 MG/ML IJ SOLN
INTRAMUSCULAR | Status: DC | PRN
  Administered 2011-03-19 (×2): 50 ug via INTRAVENOUS

## 2011-03-19 MED ORDER — LACTATED RINGERS IV SOLN
INTRAVENOUS | Status: DC
  Administered 2011-03-19: 08:00:00 via INTRAVENOUS

## 2011-03-19 MED ORDER — CHLORHEXIDINE GLUCONATE 4 % EX LIQD: 60.0000 mL | Freq: Once | CUTANEOUS | Status: DC

## 2011-03-19 MED ORDER — MIDAZOLAM HCL 5 MG/5ML IJ SOLN
INTRAMUSCULAR | Status: DC | PRN
  Administered 2011-03-19 (×2): 1 mg via INTRAVENOUS

## 2011-03-19 MED ORDER — MEPERIDINE HCL 25 MG/ML IJ SOLN: 6.2500 mg | INTRAMUSCULAR | Status: DC | PRN

## 2011-03-19 MED ORDER — LIDOCAINE HCL (PF) 2 % IJ SOLN
INTRAMUSCULAR | Status: DC | PRN
  Administered 2011-03-19: 4 mL

## 2011-03-19 MED ORDER — DEXAMETHASONE SODIUM PHOSPHATE 10 MG/ML IJ SOLN
INTRAMUSCULAR | Status: DC | PRN
  Administered 2011-03-19: 10 mg via INTRAVENOUS

## 2011-03-19 MED ORDER — PROPOFOL 10 MG/ML IV EMUL
INTRAVENOUS | Status: DC | PRN
  Administered 2011-03-19: 75 ug/kg/min via INTRAVENOUS

## 2011-03-19 MED ORDER — PROMETHAZINE HCL 25 MG/ML IJ SOLN: 6.2500 mg | INTRAMUSCULAR | Status: DC | PRN

## 2011-03-19 MED ORDER — HYDROCODONE-ACETAMINOPHEN 5-325 MG PO TABS: ORAL_TABLET | ORAL | Status: AC

## 2011-03-19 MED ORDER — ONDANSETRON HCL 4 MG/2ML IJ SOLN
INTRAMUSCULAR | Status: DC | PRN
  Administered 2011-03-19: 4 mg via INTRAVENOUS

## 2011-03-19 MED ORDER — PROPOFOL 10 MG/ML IV EMUL
INTRAVENOUS | Status: DC | PRN
  Administered 2011-03-19: 30 mg via INTRAVENOUS

## 2011-03-19 MED ORDER — FENTANYL CITRATE 0.05 MG/ML IJ SOLN: 25.0000 ug | INTRAMUSCULAR | Status: DC | PRN

## 2011-03-19 SURGICAL SUPPLY — 32 items
BLADE SURG 15 STRL LF DISP TIS (BLADE) ×1 IMPLANT
BLADE SURG 15 STRL SS (BLADE) ×1
BNDG CMPR 9X4 STRL LF SNTH (GAUZE/BANDAGES/DRESSINGS) ×1
BNDG ELASTIC 2 VLCR STRL LF (GAUZE/BANDAGES/DRESSINGS) ×4 IMPLANT
BNDG ESMARK 4X9 LF (GAUZE/BANDAGES/DRESSINGS) ×2 IMPLANT
BRUSH SCRUB EZ PLAIN DRY (MISCELLANEOUS) ×2 IMPLANT
CLOTH BEACON ORANGE TIMEOUT ST (SAFETY) ×2 IMPLANT
CORDS BIPOLAR (ELECTRODE) ×2 IMPLANT
COVER MAYO STAND STRL (DRAPES) ×4 IMPLANT
COVER TABLE BACK 60X90 (DRAPES) ×2 IMPLANT
CUFF TOURNIQUET SINGLE 18IN (TOURNIQUET CUFF) ×2 IMPLANT
DECANTER SPIKE VIAL GLASS SM (MISCELLANEOUS) ×2 IMPLANT
DRAPE EXTREMITY T 121X128X90 (DRAPE) ×4 IMPLANT
DRAPE SURG 17X23 STRL (DRAPES) ×4 IMPLANT
GAUZE SPONGE 4X4 12PLY STRL LF (GAUZE/BANDAGES/DRESSINGS) ×6 IMPLANT
GAUZE XEROFORM 1X8 LF (GAUZE/BANDAGES/DRESSINGS) ×2 IMPLANT
GLOVE BIO SURGEON STRL SZ 6.5 (GLOVE) ×2 IMPLANT
GLOVE BIOGEL M STRL SZ7.5 (GLOVE) ×2 IMPLANT
GLOVE EXAM NITRILE MD LF STRL (GLOVE) ×2 IMPLANT
GLOVE ORTHO TXT STRL SZ7.5 (GLOVE) ×2 IMPLANT
GOWN PREVENTION PLUS XLARGE (GOWN DISPOSABLE) ×2 IMPLANT
GOWN STRL REIN XL XLG (GOWN DISPOSABLE) ×4 IMPLANT
NEEDLE 27GAX1X1/2 (NEEDLE) ×4 IMPLANT
PACK BASIN DAY SURGERY FS (CUSTOM PROCEDURE TRAY) ×2 IMPLANT
PAD CAST 4YDX4 CTTN HI CHSV (CAST SUPPLIES) IMPLANT
PADDING CAST COTTON 4X4 STRL (CAST SUPPLIES)
SPONGE GAUZE 4X4 12PLY (GAUZE/BANDAGES/DRESSINGS) IMPLANT
STOCKINETTE 4X48 STRL (DRAPES) ×2 IMPLANT
SYR CONTROL 10ML LL (SYRINGE) ×2 IMPLANT
TOWEL OR 17X24 6PK STRL BLUE (TOWEL DISPOSABLE) ×2 IMPLANT
UNDERPAD 30X30 INCONTINENT (UNDERPADS AND DIAPERS) ×4 IMPLANT
WATER STERILE IRR 1000ML POUR (IV SOLUTION) IMPLANT

## 2011-03-19 NOTE — Transfer of Care (Signed)
Immediate Anesthesia Transfer of Care Note  Patient: Gloria Anderson  Procedure(s) Performed:  RELEASE TRIGGER FINGER/A-1 PULLEY - Procedure:  Release Right Long and Ring Trigger Fingers, Release Left Long Trigger Finger, Injection Left Long Proximal Phalangeal Joint  Patient Location: PACU  Anesthesia Type: MAC  Level of Consciousness: awake and alert   Airway & Oxygen Therapy: Patient Spontanous Breathing and Patient connected to face mask oxygen  Post-op Assessment: Report given to PACU RN and Post -op Vital signs reviewed and stable  Post vital signs: Reviewed and stable Filed Vitals:   03/19/11 1020  BP: 128/48  Pulse: 79  Temp:   Resp:     Complications: No apparent anesthesia complications

## 2011-03-19 NOTE — Brief Op Note (Signed)
03/19/2011  10:11 AM  PATIENT:  Gloria Anderson  78 y.o. female  PRE-OPERATIVE DIAGNOSIS:  trigger fingers right long and right ring and left long with stiff swollen PIP joint left long  POST-OPERATIVE DIAGNOSIS:  trigger fingers right long and right ring and left long with stiff swollen PIP joint left long  PROCEDURE:  Procedure(s): RELEASE TRIGGER FINGER/A-1 PULLEY RIGHT LONG, RING FINGERS AND LEFT LONG FINGER.  INJECT DEPOMEDROL AND 2% LIDOCAINE LEFT LONG PIP JOINT  SURGEON:  Surgeon(s): Wyn Forster., MD  PHYSICIAN ASSISTANT:   ASSISTANTS: Mallory Shirk.A-C   ANESTHESIA:   IV sedation  EBL:  Total I/O In: 400 [I.V.:400] Out: -   BLOOD ADMINISTERED:none  DRAINS: none   LOCAL MEDICATIONS USED:  XYLOCAINE 4 CC 2%  SPECIMEN:  No Specimen  DISPOSITION OF SPECIMEN:  N/A  COUNTS:  YES  TOURNIQUET:   Total Tourniquet Time Documented: Upper Arm (Right) - 11 minutes  DICTATION: .Other Dictation: Dictation Number [id number]  PLAN OF CARE: Discharge to home after PACU  PATIENT DISPOSITION:  PACU - hemodynamically stable.

## 2011-03-19 NOTE — Op Note (Signed)
NAMECASSADIE, Gloria Anderson NO.:  [id number]  MEDICAL RECORD NO.:  [id number]  LOCATION:                                 FACILITY:  PHYSICIAN:  Katy Fitch. Blaike Vickers, M.D.      DATE OF BIRTH:  DATE OF PROCEDURE:  03/19/2011 DATE OF DISCHARGE:                              OPERATIVE REPORT   PREOPERATIVE DIAGNOSIS:  Multiple trigger fingers including locking stenosing tenosynovitis of right long and ring fingers, also left long finger, and stiff and swollen left long finger proximal interphalangeal joint.  POSTOPERATIVE DIAGNOSIS:  Multiple trigger fingers including locking stenosing tenosynovitis of right long and ring fingers, also left long finger, and stiff and swollen left long finger proximal interphalangeal joint.  OPERATION: 1. Release of right long finger A1 pulley. 2. Release of right ring finger A1 pulley. 3. Release of left long finger A1 pulley. 4. Injection of left long finger proximal interphalangeal joint     capsule with 20 mg of Depo-Medrol and 1 mL of 2% lidocaine.  OPERATING SURGEON:  Katy Fitch. Gamble Enderle, MD  ASSISTANT:  Marveen Reeks Dasnoit, PA-C  ANESTHESIA:  General sedation supplemented by 2% lidocaine field block of anticipated incision sites and flexor sheath of right long and right ring and left long fingers.  Total volume of 2% lidocaine, 4 mL.  INDICATIONS:  Gloria Anderson is a well known patient referred through the courtesy of Dr. Ardyth Harps for management of chronic trigger fingers.  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  We advised Gloria Anderson to present for surgical release of her A1 pulley at this time.  We discussed injection of the left long finger PIP joint capsule in an effort to relieve her swelling and pain.  After informed consent, she was  brought to the operating room at this time.  She was re-interviewed in the holding area and questions were invited and answered in detail.  Her operative sites were marked with a marking pencil.  PROCEDURE:  Gloria Anderson was brought to room 1 of the [facility] and placed in a supine position on the operating table.  Following IV sedation, the right and left arms and hands were prepped with Betadine followed by infiltration of 2% lidocaine into the flexor sheaths of the right long, right ring, and left long fingers. The skin at the sites of the anticipated incision were likewise infiltrated.  Total volume of 2% lidocaine 4 mL was administered.  The right and left arms were then prepped with Betadine soap and solution, sterilely draped.  A pneumatic tourniquet was applied to proximal right brachium.  We will use an Esmarch on the left.  Following exsanguination of the right arm with Esmarch bandage, arterial tourniquet on the proximal brachium inflated to 220 mm hg.  A routine surgical time-out was accomplished on both sides, followed by creation of 2 oblique incisions directly over the A1 pulleys of the right long and ring fingers.  Subcutaneous tissues were carefully divided taking care to release the palmar fascia at the site of  the A1 pulleys.  The pulleys were isolated, split with scalpel and scissors, and the tendons delivered.  Minor synovectomy was accomplished for the long and ring fingers.  Full range of motion of both the right long and right ring fingers recovered.  Both wounds were then repaired with intradermal 3-0 Prolene.  Tourniquet was released with immediate capillary refill.  Attention was then directed to the left hand.  The left hand and arm were exsanguinated with an Esmarch bandage, and Esmarch bandage was left on the proximal forearm as a tourniquet.  The hand was placed in a lead- hand followed by creation of oblique incision over the A1  pulley of the left long finger.  The palmar fascia was released.  The pulley was isolated.  The pulley was released with scalpel and scissors, and the tendons delivered.  Tenosynovectomy was not required.  The wound was repaired with intradermal 3-0 Prolene.  The PIP joint was then infiltrated with 1.5 mL of a mixture of Depo-Medrol 20 mg and lidocaine 2% approximately 1 mL.  Good joint distention was achieved.  Left hand was then dressed with Xeroflo, sterile gauze, and Ace wrap.  For aftercare, Gloria Anderson is advised to begin immediate range of motion exercises.  We will see her back for followup in our office in 1 week for dressing change, suture removal, and initiation of her postoperative therapy program.  For postoperative pain, she is provided hydrocodone 5/325, 1 p.o. q.4-6 hours p.r.n. pain, 24 tablets, without refills.     Katy Fitch Keval Nam, M.D.     RVS/MEDQ  D:  03/19/2011  T:  03/19/2011  Job:  [id number]  cc:   Jeannett Senior A. Evlyn Kanner, M.D.

## 2011-03-19 NOTE — Anesthesia Preprocedure Evaluation (Signed)
Anesthesia Evaluation  Patient identified by MRN, date of birth, ID band Patient awake    Reviewed: Allergy & Precautions, H&P , NPO status , Patient's Chart, lab work & pertinent test results  Airway Mallampati: I TM Distance: >3 FB Neck ROM: Full    Dental No notable dental hx. (+) Teeth Intact   Pulmonary neg pulmonary ROS,  clear to auscultation  Pulmonary exam normal       Cardiovascular hypertension, On Medications Regular Normal    Neuro/Psych Negative Neurological ROS  Negative Psych ROS   GI/Hepatic Neg liver ROS, GERD-  Medicated and Controlled,  Endo/Other  Negative Endocrine ROS  Renal/GU negative Renal ROS  Genitourinary negative   Musculoskeletal   Abdominal   Peds  Hematology negative hematology ROS (+)   Anesthesia Other Findings   Reproductive/Obstetrics negative OB ROS                           Anesthesia Physical Anesthesia Plan  ASA: II  Anesthesia Plan: MAC   Post-op Pain Management:    Induction: Intravenous  Airway Management Planned: Mask  Additional Equipment:   Intra-op Plan:   Post-operative Plan:   Informed Consent: I have reviewed the patients History and Physical, chart, labs and discussed the procedure including the risks, benefits and alternatives for the proposed anesthesia with the patient or authorized representative who has indicated his/her understanding and acceptance.     Plan Discussed with: CRNA  Anesthesia Plan Comments:         Anesthesia Quick Evaluation

## 2011-03-19 NOTE — H&P (Signed)
Gloria Anderson is an 78 y.o. female.   Chief Complaintwith persistent triggering of her right long finger, right ring finger, new onset triggering of her left long finger and a 20 degree flexion contracture of her left long finger PIP joint. HPI: Patient is a 78 year old right-hand-dominant female who presented to our office recently complaining of persistent triggering of the right long right ring and inset of triggering of the left long finger past for 6 months. No history of injury to either hand recently. After lengthy discussion examination the office she wished to proceed with release of the A1 pulley of her multiple trigger fingers in addition to injection of her PIP of the left long finger due to her long-standing flexion contracture.  Past Medical History  Diagnosis Date  . Hypertension   . GERD (gastroesophageal reflux disease)   . Arthritis   . Hypothyroidism     Past Surgical History  Procedure Date  . Shoulder arthroscopy 2007    rt   . Nasal sinus surgery   . Tonsillectomy   . Colonoscopy w/ polypectomy   . Eye surgery     both cataracts    History reviewed. No pertinent family history. Social History:  reports that she has never smoked. She does not have any smokeless tobacco history on file. She reports that she drinks alcohol. She reports that she does not use illicit drugs.  Allergies: No Known Allergies  Medications Prior to Admission  Medication Dose Route Frequency Provider Last Rate Last Dose  . chlorhexidine (HIBICLENS) 4 % liquid 4 application  60 mL Topical Once       . lactated ringers infusion   Intravenous Continuous Germaine Pomfret, MD 20 mL/hr at 03/19/11 0809     Medications Prior to Admission  Medication Sig Dispense Refill  . aspirin 81 MG tablet Take 160 mg by mouth daily.      . benazepril (LOTENSIN) 10 MG tablet Take 10 mg by mouth daily.      . cholecalciferol (VITAMIN D) 1000 UNITS tablet Take 1,000 Units by mouth daily.      .  fish oil-omega-3 fatty acids 1000 MG capsule Take 2 g by mouth daily.      Marland Kitchen glucosamine-chondroitin 500-400 MG tablet Take 1 tablet by mouth 3 (three) times daily.      Marland Kitchen levothyroxine (SYNTHROID, LEVOTHROID) 50 MCG tablet Take 50 mcg by mouth daily.      . magnesium 30 MG tablet Take 30 mg by mouth 2 (two) times daily.      Marland Kitchen omeprazole (PRILOSEC) 20 MG capsule Take 20 mg by mouth daily.        Results for orders placed during the hospital encounter of 03/19/11 (from the past 48 hour(s))  BASIC METABOLIC PANEL     Status: Abnormal   Collection Time   03/18/11  9:00 AM      Component Value Range Comment   Sodium 140  135 - 145 (mEq/L)    Potassium 4.0  3.5 - 5.1 (mEq/L)    Chloride 104  96 - 112 (mEq/L)    CO2 27  19 - 32 (mEq/L)    Glucose, Bld 121 (*) 70 - 99 (mg/dL)    BUN 23  6 - 23 (mg/dL)    Creatinine, Ser 1.61  0.50 - 1.10 (mg/dL)    Calcium 9.6  8.4 - 10.5 (mg/dL)    GFR calc non Af Amer 70 (*) >90 (mL/min)    GFR calc Af Denyse Dago  82 (*) >90 (mL/min)   POCT HEMOGLOBIN-HEMACUE     Status: Abnormal   Collection Time   03/19/11  8:17 AM      Component Value Range Comment   Hemoglobin 10.0 (*) 12.0 - 15.0 (g/dL)     No results found.   Pertinent items are noted in HPI.  Blood pressure 167/72, pulse 85, temperature 98 F (36.7 C), temperature source Oral, resp. rate 20, SpO2 100.00%.  General appearance: alert Head: Normocephalic, without obvious abnormality Neck: supple, symmetrical, trachea midline Resp: clear to auscultation bilaterally Cardio: regular rate and rhythm, S1, S2 normal, no murmur, click, rub or gallop GI: normal findings: bowel sounds normal Extremities: Examination of her hands reveals persistent triggering of her right long finger, right ring finger, new onset triggering of her left long finger and a 20 degree flexion contracture of her left long finger PIP joint.  Prior x-rays have revealed moderate degenerative arthritis of her right long finger PIP  joint.   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  Pulses: 2+ and symmetric Skin: normal Neurologic: Grossly normal    Assessment/Plan Impression: Chronic triggering of the right long finger right ring finger and left long finger in addition to PIP flexion contracture of the left long finger.  Plan: Patient undergo release of A1 pulley of the right long finger, right ring finger, left long finger and injection of left long finger PIP joint. The procedure risks benefits and postoperative course were discussed with the patient at length and she was agreement with this plan.  DASNOIT,Sigourney Portillo J 03/19/2011, 8:40 AM    H&P documentation: 03/19/2011  -History and Physical Reviewed  -Patient has been re-examined  -No change in the plan of care  Wyn Forster, MD

## 2011-03-19 NOTE — Op Note (Signed)
Op note dictated:  03/19/11 161096

## 2011-03-19 NOTE — Anesthesia Postprocedure Evaluation (Signed)
  Anesthesia Post-op Note  Patient: Gloria Anderson  Procedure(s) Performed:  RELEASE TRIGGER FINGER/A-1 PULLEY - Procedure:  Release Right Long and Ring Trigger Fingers, Release Left Long Trigger Finger, Injection Left Long Proximal Phalangeal Joint  Patient Location: PACU  Anesthesia Type: MAC  Level of Consciousness: awake and alert   Airway and Oxygen Therapy: Patient Spontanous Breathing  Post-op Pain: none  Post-op Assessment: Post-op Vital signs reviewed, Patient's Cardiovascular Status Stable, Respiratory Function Stable, Patent Airway and No signs of Nausea or vomiting  Post-op Vital Signs: Reviewed and stable  Complications: No apparent anesthesia complications

## 2011-03-19 NOTE — H&P (Deleted)
  Minor room patient for local trigger thumb release.  H&P documentation: 03/19/2011  -History and Physical Reviewed  -Patient has been re-examined  -No change in the plan of care  Wyn Forster, MD

## 2011-03-22 ENCOUNTER — Encounter (HOSPITAL_BASED_OUTPATIENT_CLINIC_OR_DEPARTMENT_OTHER): Payer: Self-pay | Admitting: Orthopedic Surgery

## 2011-11-17 ENCOUNTER — Other Ambulatory Visit (HOSPITAL_COMMUNITY): Payer: Self-pay | Admitting: Endocrinology

## 2011-11-17 DIAGNOSIS — R000 Tachycardia, unspecified: Secondary | ICD-10-CM

## 2011-11-18 ENCOUNTER — Ambulatory Visit (HOSPITAL_COMMUNITY): Payer: Medicare Other | Attending: Cardiology | Admitting: Radiology

## 2011-11-18 DIAGNOSIS — I10 Essential (primary) hypertension: Secondary | ICD-10-CM | POA: Insufficient documentation

## 2011-11-18 DIAGNOSIS — R000 Tachycardia, unspecified: Secondary | ICD-10-CM | POA: Insufficient documentation

## 2011-11-18 DIAGNOSIS — I369 Nonrheumatic tricuspid valve disorder, unspecified: Secondary | ICD-10-CM

## 2011-11-18 NOTE — Progress Notes (Signed)
Echocardiogram performed.  

## 2011-11-22 ENCOUNTER — Other Ambulatory Visit (HOSPITAL_COMMUNITY): Payer: Medicare Other

## 2011-11-22 ENCOUNTER — Encounter (HOSPITAL_COMMUNITY): Payer: Self-pay | Admitting: Endocrinology

## 2011-12-06 ENCOUNTER — Ambulatory Visit
Admission: RE | Admit: 2011-12-06 | Discharge: 2011-12-06 | Disposition: A | Discharge status: Home / Self Care | Payer: Medicare Other | Source: Ambulatory Visit | Attending: Internal Medicine | Admitting: Internal Medicine

## 2011-12-06 ENCOUNTER — Other Ambulatory Visit: Payer: Self-pay | Admitting: Internal Medicine

## 2011-12-06 DIAGNOSIS — R634 Abnormal weight loss: Secondary | ICD-10-CM

## 2011-12-06 DIAGNOSIS — R0989 Other specified symptoms and signs involving the circulatory and respiratory systems: Secondary | ICD-10-CM

## 2011-12-06 DIAGNOSIS — I10 Essential (primary) hypertension: Secondary | ICD-10-CM

## 2011-12-06 MED ORDER — IOHEXOL 350 MG/ML SOLN
80.0000 mL | Freq: Once | INTRAVENOUS | Status: AC | PRN
  Administered 2011-12-06: 80 mL via INTRAVENOUS

## 2013-01-01 ENCOUNTER — Other Ambulatory Visit: Payer: Self-pay | Admitting: Gynecology

## 2013-06-21 ENCOUNTER — Encounter: Payer: Self-pay | Admitting: Neurology

## 2013-06-27 ENCOUNTER — Encounter (INDEPENDENT_AMBULATORY_CARE_PROVIDER_SITE_OTHER): Payer: Self-pay

## 2013-06-27 ENCOUNTER — Ambulatory Visit (INDEPENDENT_AMBULATORY_CARE_PROVIDER_SITE_OTHER): Payer: Medicare Other | Admitting: Neurology

## 2013-06-27 ENCOUNTER — Encounter: Payer: Self-pay | Admitting: Neurology

## 2013-06-27 VITALS — BP 141/68 | HR 66 | Resp 16 | Ht 64.25 in | Wt 130.0 lb

## 2013-06-27 DIAGNOSIS — R0609 Other forms of dyspnea: Secondary | ICD-10-CM

## 2013-06-27 DIAGNOSIS — R0989 Other specified symptoms and signs involving the circulatory and respiratory systems: Secondary | ICD-10-CM

## 2013-06-27 DIAGNOSIS — M353 Polymyalgia rheumatica: Secondary | ICD-10-CM

## 2013-06-27 DIAGNOSIS — R0683 Snoring: Secondary | ICD-10-CM

## 2013-06-27 NOTE — Patient Instructions (Signed)
Polysomnography (Sleep Studies) Polysomnography (PSG) is a series of tests used for detecting (diagnosing) obstructive sleep apnea and other sleep disorders. The tests measure how some parts of your body are working while you are sleeping. The tests are extensive and expensive. They are done in a sleep lab or hospital, and vary from center to center. Your caregiver may perform other more simple sleep studies and questionnaires before doing more complete and involved testing. Testing may not be covered by insurance. Some of these tests are:  An EEG (Electroencephalogram). This tests your brain waves and stages of sleep.  An EOG (Electrooculogram). This measures the movements of your eyes. It detects periods of REM (rapid eye movement) sleep, which is your dream sleep.  An EKG (Electrocardiogram). This measures your heart rhythm.  EMG (Electromyography). This is a measurement of how the muscles are working in your upper airway and your legs while sleeping.  An oximetry measurement. It measures how much oxygen (air) you are getting while sleeping.  Breathing efforts may be measured. The same test can be interpreted (understood) differently by different caregivers and centers that study sleep.  Studies may be given an apnea/hypopnea index (AHI). This is a number which is found by counting the times of no breathing or under breathing during the night, and relating those numbers to the amount of time spent in bed. When the AHI is greater than 15, the patient is likely to complain of daytime sleepiness. When the AHI is greater than 30, the patient is at increased risk for heart problems and must be followed more closely. Following the AHI also allows you to know how treatment is working. Simple oximetry (tracking the amount of oxygen that is taken in) can be used for screening patients who:  Do not have symptoms (problems) of OSA.  Have a normal Epworth Sleepiness Scale Score.  Have a low pre-test  probability of having OSA.  Have none of the upper airway problems likely to cause apnea.  Oximetry is also used to determine if treatment is effective in patients who showed significant desaturations (not getting enough oxygen) on their home sleep study. One extra measure of safety is to perform additional studies for the person who only snores. This is because no one can predict with absolute certainty who will have OSA. Those who show significant desaturations (not getting enough oxygen) are recommended to have a more detailed sleep study. Document Released: 08/22/2002 Document Revised: 05/10/2011 Document Reviewed: 02/15/2005 [employer] Patient Information 2014 [employer].  

## 2013-06-27 NOTE — Progress Notes (Signed)
[facility]  Provider:  Larey Seat, M D  Referring Provider: Sheela Stack, MD Primary Care Physician:  Sheela Stack, MD  Chief Complaint  Patient presents with  . New Evaluation    Room 11  . Tremors    HPI:  Gloria Anderson is a 80 y.o. female  Is seen here as a referral  from Dr. Forde Dandy for an evaluation of night terrors;  The patient,  a caucasian right handed female , originally from [place], reports vivid dreams, with fearful frightful content.  Her dreams have let her to act out some of the dream content, she wakes with her arms extended and with palpitations, tachycardia form fright. She kicks and fell out of bed, yelling, calling out . Following  one dream last year,  she injured her face in a fall . She begun covering all sharp edges.  She has polymyalgia rheumatica and was treated with prednisone , gaining weight through this time.  She goes to bed around 11.15 Pm, after watching TV in her living room. Falls asleep promptly , and wakes at 12.15 hearing a noise , that seemed to arise form behind her headboard- the noise goes away once she is awake. She could fall asleep again. Her acting out of dreams arises at 4 AM or later in the last stages of sleep and gets more frequently over the last year.  She rises at 8.30, the alarm wakes her at 8.  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  No childhood sleep disorder history. Usually a 7 -8 hour sleeper , refreshed.  The patient is widowed for 12 years, has adult children and grandchildren.  Review of Systems: Out of a complete 14 system review, the patient complains of only the following symptoms, and all other reviewed systems are negative. Acting out dreams, yelling but not leaving the bed.  History   Social History  . Marital Status: Widowed    Spouse Name: N/A    Number of Children: 2  . Years of Education: HS   Occupational History  .  [employer]   Social History Main Topics  . Smoking  status: Never Smoker   . Smokeless tobacco: Never Used  . Alcohol Use: Yes     Comment: rarely  . Drug Use: No  . Sexual Activity: Not on file   Other Topics Concern  . Not on file   Social History Narrative   Patient is widowed.   Patient has two children.   Patient does not drink any caffeine.    Patient has a high school education.   Patient is right-handed.             Family History  Problem Relation Age of Onset  . CVA Father   . Tuberculosis Mother   . Lung cancer Brother     Past Medical History  Diagnosis Date  . Hypertension   . GERD (gastroesophageal reflux disease)   . Arthritis   . Hypothyroidism   . Osteoporosis   . Depression   . Polymyalgia     Past Surgical History  Procedure Laterality Date  . Shoulder arthroscopy  2007    rt   . Nasal sinus surgery    . Tonsillectomy    . Colonoscopy w/ polypectomy    . Eye surgery      both cataracts  . Trigger finger release  03/19/2011    Procedure: RELEASE TRIGGER FINGER/A-1 PULLEY;  Surgeon: Cammie Sickle., MD;  Location: [facility];  Service: Orthopedics;  Laterality: Right;  Procedure:  Release Right Long and Ring Trigger Fingers, Release Left Long Trigger Finger, Injection Left Long Proximal Phalangeal Joint    Current Outpatient Prescriptions  Medication Sig Dispense Refill  . ALPRAZolam (XANAX) 0.25 MG tablet Take 0.25 mg by mouth at bedtime as needed for anxiety. 1/2 tablet as needed      . aspirin 81 MG tablet Take 160 mg by mouth daily.      . B Complex-C-Zn-Folic Acid (VITALINE BIOTIN FORTE/ZINC PO) Take 1 tablet by mouth daily.      . benazepril (LOTENSIN) 40 MG tablet Take 40 mg by mouth daily.      . calcium carbonate (OS-CAL) 600 MG TABS tablet Take 600 mg by mouth 2 (two) times daily with a meal.      . carvedilol (COREG) 3.125 MG tablet 2 tablets 4 (four) times daily.      . Cholecalciferol (VITAMIN D PO) Take 1 tablet by mouth daily. 500 mg daily      . Docusate  Calcium (STOOL SOFTENER PO) Take 1 tablet by mouth daily. 200 mg      . fish oil-omega-3 fatty acids 1000 MG capsule Take 2 g by mouth daily.      . Garlic 2423 MG TBEC Take 1 tablet by mouth daily.      . hydroxychloroquine (PLAQUENIL) 200 MG tablet 1 tablet daily.      . Ibuprofen-Diphenhydramine Cit (ADVIL PM PO) Take by mouth. As needed.      . Iron-Vitamins (GERITOL COMPLETE) TABS Take 1 tablet by mouth daily.      Marland Kitchen levothyroxine (SYNTHROID, LEVOTHROID) 50 MCG tablet Take 50 mcg by mouth daily.      . Linaclotide (LINZESS) 145 MCG CAPS capsule Take 145 mcg by mouth. Once a week as needed.      . Mouthwashes (BIOTENE DRY MOUTH MT) Use as directed in the mouth or throat.      . ofloxacin (OCUFLOX) 0.3 % ophthalmic solution 1 drop. Four times a day in both eyes.      Marland Kitchen omeprazole (PRILOSEC) 20 MG capsule Take 20 mg by mouth daily.      Marland Kitchen PREDNISONE PO Take 1 tablet by mouth daily. 3 mg- tapering off      . Probiotic Product (PROBIOTIC DAILY PO) Take 1 tablet by mouth daily.       No current facility-administered medications for this visit.    Allergies as of 06/27/2013  . (No Known Allergies)    Vitals: BP 141/68  Pulse 66  Resp 16  Ht 5' 4.25" (1.632 m)  Wt 130 lb (58.968 kg)  BMI 22.14 kg/m2 Last Weight:  Wt Readings from Last 1 Encounters:  06/27/13 130 lb (58.968 kg)   Last Height:   Ht Readings from Last 1 Encounters:  06/27/13 5' 4.25" (1.632 m)    Physical exam:  General: The patient is awake, alert and appears not in acute distress. The patient is well groomed. Head: Normocephalic, atraumatic. Neck is supple. Mallampati 3 , retrognathia , neck circumference: 14 ,25 inches.  Cardiovascular:  Regular rate and rhythm , without  murmurs , has a  left sided  carotid bruit, no distended neck veins. Respiratory: Lungs are clear to auscultation. Skin:  Without evidence of edema, or rash Trunk:  normal posture.  Neurologic exam : The patient is awake and alert,  oriented to place and time.  Memory subjective described as intact.  There is a normal attention span & concentration ability. Speech is fluent without dysarthria, dysphonia or aphasia. Mood and affect are appropriate.  Cranial nerves: Pupils are equal and briskly reactive to light. Funduscopic exam without evidence of pallor or edema. Extraocular movements  in vertical and horizontal planes intact and without nystagmus. Visual fields by finger perimetry are intact. Hearing to finger rub intact.  Facial sensation intact to fine touch. Facial motor strength is symmetric and tongue and uvula move midline.  Motor exam:  Normal tone , muscle bulk and symmetric normal strength in all extremities.  Sensory:  Fine touch, pinprick and vibration were tested in all extremities. Proprioception is  normal.  Coordination: Rapid alternating movements in the fingers/hands is tested and normal. Finger-to-nose maneuver tested and normal without evidence of ataxia, dysmetria or tremor.  Gait and station: Patient walks without assistive device .Deep tendon reflexes: in the  upper and lower extremities are symmetric and intact. Babinski maneuver response is  downgoing.   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 She has no PD features or symptoms.   Plan:  Treatment plan and additional workup :  2) PSG with EMG and Video. Split at AHI 15.

## 2013-08-05 ENCOUNTER — Ambulatory Visit (INDEPENDENT_AMBULATORY_CARE_PROVIDER_SITE_OTHER): Payer: Medicare Other | Admitting: Neurology

## 2013-08-05 DIAGNOSIS — R0609 Other forms of dyspnea: Secondary | ICD-10-CM

## 2013-08-05 DIAGNOSIS — R0683 Snoring: Secondary | ICD-10-CM

## 2013-08-05 DIAGNOSIS — M353 Polymyalgia rheumatica: Secondary | ICD-10-CM

## 2013-08-05 DIAGNOSIS — R0989 Other specified symptoms and signs involving the circulatory and respiratory systems: Secondary | ICD-10-CM

## 2013-08-14 NOTE — Sleep Study (Signed)
See media tab for full report  

## 2013-08-20 ENCOUNTER — Telehealth: Payer: Self-pay | Admitting: Neurology

## 2013-08-20 ENCOUNTER — Encounter: Payer: Self-pay | Admitting: *Deleted

## 2013-08-20 NOTE — Telephone Encounter (Signed)
Patient called back about her sleep study results. I informed the patient of her sleep study results. Patient stated it doesn't answer the questions of her night terrors. I informed the patient that per the report " We were not able to caputure any sleep behavior abnormalities on video, neither sleep talking, thrashing, yelling, etc. Patient stated she would like a follow up visit with Dr. Brett Fairy to discuss the report. Patient has been scheduled for June 26,2015 at 9:30 am with an arrival time of 9:15 am.

## 2013-08-20 NOTE — Telephone Encounter (Signed)
I called and left a message for the patient about her recent sleep study results. I informed the patient that the study revealed no evidence for significant central or obstructive sleep apnea and significant periodic limb movements of sleep. I will fax a copy to Dr. Lorain Childes office and mail a copy to the patient.

## 2013-08-24 ENCOUNTER — Encounter: Payer: Self-pay | Admitting: Neurology

## 2013-08-24 ENCOUNTER — Ambulatory Visit (INDEPENDENT_AMBULATORY_CARE_PROVIDER_SITE_OTHER): Payer: Medicare Other | Admitting: Neurology

## 2013-08-24 VITALS — BP 118/57 | HR 77 | Resp 16 | Ht 64.5 in | Wt 128.0 lb

## 2013-08-24 DIAGNOSIS — H81319 Aural vertigo, unspecified ear: Secondary | ICD-10-CM

## 2013-08-24 DIAGNOSIS — R569 Unspecified convulsions: Secondary | ICD-10-CM

## 2013-08-24 DIAGNOSIS — G40909 Epilepsy, unspecified, not intractable, without status epilepticus: Secondary | ICD-10-CM

## 2013-08-24 DIAGNOSIS — G4750 Parasomnia, unspecified: Secondary | ICD-10-CM

## 2013-08-24 DIAGNOSIS — H81312 Aural vertigo, left ear: Secondary | ICD-10-CM

## 2013-08-24 DIAGNOSIS — R0989 Other specified symptoms and signs involving the circulatory and respiratory systems: Secondary | ICD-10-CM

## 2013-08-24 MED ORDER — GABAPENTIN 300 MG PO CAPS: 300.0000 mg | ORAL_CAPSULE | Freq: Three times a day (TID) | ORAL | Status: DC

## 2013-08-24 NOTE — Progress Notes (Signed)
[facility]  Provider:  Larey Seat, M D  Referring Provider: Sheela Stack, MD Primary Care Physician:  Sheela Stack, MD  Chief Complaint  Patient presents with  . Follow-up    Room 11  . Results    HPI:  Gloria Anderson is a 80 y.o. female  Is seen here as a referral  from Dr. Forde Dandy for an evaluation of possible night terrors;  The patient sleep study from 08-05-13 revealed no apnea, no oxygen desaturation, no irregular heart beats, and only took clusters of limb movements that seem to be related to discomfort with sleeping on the left side. She had no periodic limb movements during REM sleep. There was a prolonged period of slow-wave sleep at around 2 AM during which nor night terrors sleep walking or sleep talking was ordered.  The patient reports today that she had another spell- a feeling as if something brushes up on her scalp, or behind her head.  Since the patient has a long-standing bruit that she can hear at night and that bothers her( tinnitus) I have also suggested to do an MRA of the brain , as I am concerned that they're tortuous vessels , which  may be affecting the left frontal lobe perfusion.  This could be a trigger for a nocturnal event as described by the patient clinically.      Last visit:   The patient,  a caucasian right handed female , originally from [place], reports vivid dreams, with fearful frightful content.  Her dreams have let her to act out some of the dream content, she wakes with her arms extended and with palpitations, tachycardia form fright. She kicks and fell out of bed, yelling, calling out . Following  one dream last year,  she injured her face in a fall . She begun covering all sharp edges.  She has polymyalgia rheumatica and was treated with prednisone , gaining weight through this time.  She goes to bed around 11.15 Pm, after watching TV in her living room. Falls asleep promptly , and wakes at 12.15 hearing  a noise , that seemed to arise form behind her headboard- the noise goes away once she is awake. She could fall asleep again. Her acting out of dreams arises at 4 AM or later in the last stages of sleep and gets more frequently over the last year.  She rises at 8.30, the alarm wakes her at 8.  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  No childhood sleep disorder history. Usually a 7 -8 hour sleeper , refreshed.  The patient is widowed for 12 years, has adult children and grandchildren.  Review of Systems: Out of a complete 14 system review, the patient complains of only the following symptoms, and all other reviewed systems are negative. Acting out dreams, yelling but not leaving the bed.  History   Social History  . Marital Status: Widowed    Spouse Name: N/A    Number of Children: 2  . Years of Education: HS   Occupational History  .  [employer]   Social History Main Topics  . Smoking status: Never Smoker   . Smokeless tobacco: Never Used  . Alcohol Use: Yes     Comment: rarely  . Drug Use: No  . Sexual Activity: Not on file   Other Topics Concern  . Not on file   Social History Narrative   Patient is widowed.   Patient has two children.   Patient does  not drink any caffeine.    Patient has a high school education.   Patient is right-handed.             Family History  Problem Relation Age of Onset  . CVA Father   . Tuberculosis Mother   . Lung cancer Brother     Past Medical History  Diagnosis Date  . Hypertension   . GERD (gastroesophageal reflux disease)   . Arthritis   . Hypothyroidism   . Osteoporosis   . Depression   . Polymyalgia     Past Surgical History  Procedure Laterality Date  . Shoulder arthroscopy  2007    rt   . Nasal sinus surgery    . Tonsillectomy    . Colonoscopy w/ polypectomy    . Eye surgery      both cataracts  . Trigger finger release  03/19/2011    Procedure: RELEASE TRIGGER FINGER/A-1 PULLEY;  Surgeon: Cammie Sickle., MD;   Location: [place];  Service: Orthopedics;  Laterality: Right;  Procedure:  Release Right Long and Ring Trigger Fingers, Release Left Long Trigger Finger, Injection Left Long Proximal Phalangeal Joint    Current Outpatient Prescriptions  Medication Sig Dispense Refill  . ALPRAZolam (XANAX) 0.25 MG tablet Take 0.25 mg by mouth at bedtime as needed for anxiety. 1/2 tablet as needed      . aspirin 81 MG tablet Take 81 mg by mouth daily.       . B Complex-C-Zn-Folic Acid (VITALINE BIOTIN FORTE/ZINC PO) Take 1 tablet by mouth daily.      . benazepril (LOTENSIN) 40 MG tablet Take 40 mg by mouth daily.      . calcium carbonate (OS-CAL) 600 MG TABS tablet Take 600 mg by mouth 2 (two) times daily with a meal.      . carvedilol (COREG) 3.125 MG tablet 2 tablets 2 (two) times daily with a meal.       . Cholecalciferol (VITAMIN D PO) Take 1 tablet by mouth daily. 500 mg daily      . Docusate Calcium (STOOL SOFTENER PO) Take 1 tablet by mouth daily. 200 mg      . fish oil-omega-3 fatty acids 1000 MG capsule Take 2 g by mouth daily.      . Garlic 5188 MG TBEC Take 1 tablet by mouth daily.      . hydroxychloroquine (PLAQUENIL) 200 MG tablet 1 tablet daily.      . Ibuprofen-Diphenhydramine Cit (ADVIL PM PO) Take by mouth. As needed.      Marland Kitchen levothyroxine (SYNTHROID, LEVOTHROID) 50 MCG tablet Take 50 mcg by mouth daily.      . Linaclotide (LINZESS) 145 MCG CAPS capsule Take 145 mcg by mouth. Once a week as needed.      . mometasone (ELOCON) 0.1 % cream As needed.      . Mouthwashes (BIOTENE DRY MOUTH MT) Use as directed in the mouth or throat.      Marland Kitchen omeprazole (PRILOSEC) 20 MG capsule Take 20 mg by mouth daily.      Marland Kitchen PREDNISONE PO Take 1 tablet by mouth daily. 3 mg- tapering off      . Probiotic Product (PROBIOTIC DAILY PO) Take 1 tablet by mouth daily.       No current facility-administered medications for this visit.    Allergies as of 08/24/2013  . (No Known Allergies)     Vitals: BP 118/57  Pulse 77  Resp 16  Ht  5' 4.5" (1.638 m)  Wt 128 lb (58.06 kg)  BMI 21.64 kg/m2 Last Weight:  Wt Readings from Last 1 Encounters:  08/24/13 128 lb (58.06 kg)   Last Height:   Ht Readings from Last 1 Encounters:  08/24/13 5' 4.5" (1.638 m)    Physical exam:  General: The patient is awake, alert and appears not in acute distress. The patient is well groomed. Head: Normocephalic, atraumatic. Neck is supple. Mallampati 3 , retrognathia , neck circumference: 14 ,25 inches.  Cardiovascular:  Regular rate and rhythm , without  murmurs , has a  left sided  carotid bruit, no distended neck veins. Respiratory: Lungs are clear to auscultation. Skin:  Without evidence of edema, or rash Trunk:  normal posture.  Neurologic exam : The patient is awake and alert, oriented to place and time.  Memory subjective described as intact. There is a normal attention span & concentration ability. Speech is fluent without dysarthria, dysphonia or aphasia. Mood and affect are appropriate.  Cranial nerves: Pupils are equal and briskly reactive to light. Funduscopic exam without evidence of pallor or edema. Extraocular movements  in vertical and horizontal planes intact and without nystagmus. Visual fields by finger perimetry are intact. Hearing to finger rub intact.  Facial sensation intact to fine touch. Facial motor strength is symmetric and tongue and uvula move midline.  Motor exam:  Normal tone , muscle bulk and symmetric normal strength in all extremities.  Sensory:  Fine touch, pinprick and vibration were tested in all extremities. Proprioception is  normal.  Coordination: Rapid alternating movements in the fingers/hands is tested and normal. Finger-to-nose maneuver tested and normal without evidence of ataxia, dysmetria or tremor.  Gait and station: Patient walks without assistive device . Deep tendon reflexes: in the  upper and lower extremities are symmetric and intact.  Babinski maneuver response is  downgoing.   Assessment:  After physical and neurologic examination, review of laboratory studies, imaging, neurophysiology testing and pre-existing records, assessment is :  1) suspected REM BD was not confirmed, no PLMs in REM sleep- She has no PD features or symptoms.  2) no apnea.  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   The bruit in her left temple keeps her form sleeping on the side. She has 3 carotid dopplers over 25 years. tortious vessel.  MRI brain was normal 4 year ago ( Dr. Carloyn Manner)  No MRA was done, I like to rule out a vascular abnormality at the frontla lobe.

## 2013-08-27 ENCOUNTER — Telehealth: Payer: Self-pay | Admitting: Neurology

## 2013-08-27 NOTE — Telephone Encounter (Signed)
Patient calling to state that she is experiencing double vision due to her Gabapentin, please return call to patient and advise.

## 2013-08-27 NOTE — Telephone Encounter (Signed)
Spoke with patient and she said that she took the Gabapentin Friday and Saturday night, one at lunch and dinner on Sunday and starting having the  double vision (seeing two people) all night while awake, had some nausea also.  She did not take any today and her vision is fine.

## 2013-08-27 NOTE — Telephone Encounter (Signed)
It's not for her than. Ask her not to take the neruontin.

## 2013-08-27 NOTE — Telephone Encounter (Signed)
Informed patient per Dr Brett Fairy message below,she verbalized understanding

## 2013-09-05 ENCOUNTER — Inpatient Hospital Stay: Admission: RE | Admit: 2013-09-05 | Payer: Medicare Other | Source: Ambulatory Visit

## 2013-09-11 ENCOUNTER — Encounter: Payer: Self-pay | Admitting: Internal Medicine

## 2013-09-17 ENCOUNTER — Ambulatory Visit
Admission: RE | Admit: 2013-09-17 | Discharge: 2013-09-17 | Disposition: A | Discharge status: Home / Self Care | Payer: Medicare Other | Source: Ambulatory Visit | Attending: Neurology | Admitting: Neurology

## 2013-09-17 DIAGNOSIS — R0989 Other specified symptoms and signs involving the circulatory and respiratory systems: Secondary | ICD-10-CM

## 2013-09-17 DIAGNOSIS — R569 Unspecified convulsions: Secondary | ICD-10-CM

## 2013-09-17 DIAGNOSIS — H81312 Aural vertigo, left ear: Secondary | ICD-10-CM

## 2013-09-17 DIAGNOSIS — G4750 Parasomnia, unspecified: Secondary | ICD-10-CM

## 2013-09-17 DIAGNOSIS — G40909 Epilepsy, unspecified, not intractable, without status epilepticus: Secondary | ICD-10-CM

## 2013-09-20 ENCOUNTER — Ambulatory Visit (INDEPENDENT_AMBULATORY_CARE_PROVIDER_SITE_OTHER): Payer: Medicare Other | Admitting: Nurse Practitioner

## 2013-09-20 ENCOUNTER — Encounter: Payer: Self-pay | Admitting: Nurse Practitioner

## 2013-09-20 VITALS — BP 110/70 | HR 60 | Ht 64.25 in | Wt 124.4 lb

## 2013-09-20 DIAGNOSIS — R140 Abdominal distension (gaseous): Secondary | ICD-10-CM

## 2013-09-20 DIAGNOSIS — R143 Flatulence: Secondary | ICD-10-CM

## 2013-09-20 DIAGNOSIS — R6881 Early satiety: Secondary | ICD-10-CM

## 2013-09-20 DIAGNOSIS — R141 Gas pain: Secondary | ICD-10-CM

## 2013-09-20 DIAGNOSIS — R142 Eructation: Secondary | ICD-10-CM

## 2013-09-20 NOTE — Progress Notes (Signed)
HPI :  Patient is a 80 year old female known to Dr. Henrene Pastor for history of adenomatous colon polyps. She is due for surveillance colonoscopy October 2016. Normal EGD 2011. Patient is referred for evaluation of constipation, early satiety and bloating.  Patient reports chronic constipation which is actually doing much better as of late, she is not even requiring Linzess. The early satiety and postprandial bloating began about 2 months ago. She's lost about 3 pounds since the onset of symptoms. No associated abdominal pain or significant nausea. Patient feels full even 2 hours after eating a moderate amount of food. Comprehensive metabolic profile 9/50/93 was normal. CBC reveals normal hemoglobin of 12.1. Thyroid stimulating hormone normal at 1.5. Patient has polymyalgia rheumatica, her sed rate has come down to 25.  Patient was having problems with her blood pressure a few weeks back. On 09/11/13 BP at PCPs office was 104 / 58. Antihypertensive dose was decreased. Patient tells me that her early satiety and bloating have actually improved since reduction of  BP med dose.   Patient has been under evaluation for night terrors. Neurology ordered MRA of the brain for evaluation of a bruit. There was concern for left frontal lobe perfusion problems triggering the nocturnal events described by the patient.  Past Medical History  Diagnosis Date  . Hypertension   . GERD (gastroesophageal reflux disease)   . Arthritis   . Hypothyroidism   . Osteoporosis   . Depression   . Polymyalgia     Family History  Problem Relation Age of Onset  . CVA Father   . Tuberculosis Mother   . Lung cancer Brother    History  Substance Use Topics  . Smoking status: Never Smoker   . Smokeless tobacco: Never Used  . Alcohol Use: Yes     Comment: rarely   Current Outpatient Prescriptions  Medication Sig Dispense Refill  . ALPRAZolam (XANAX) 0.25 MG tablet Take 0.25 mg by mouth at bedtime as needed for anxiety. 1/2  tablet as needed      . aspirin 81 MG tablet Take 81 mg by mouth daily.       . B Complex-C-Zn-Folic Acid (VITALINE BIOTIN FORTE/ZINC PO) Take 1 tablet by mouth daily.      . benazepril (LOTENSIN) 40 MG tablet Take 20 mg by mouth daily.       . calcium carbonate (OS-CAL) 600 MG TABS tablet Take 600 mg by mouth 2 (two) times daily with a meal.      . carvedilol (COREG) 3.125 MG tablet 2 tablets 2 (two) times daily with a meal.       . Cholecalciferol (VITAMIN D PO) Take 1 tablet by mouth daily. 500 mg daily      . Docusate Calcium (STOOL SOFTENER PO) Take 1 tablet by mouth daily. 200 mg      . fish oil-omega-3 fatty acids 1000 MG capsule Take 2 g by mouth daily.      . Garlic 2671 MG TBEC Take 1 tablet by mouth daily.      . hydroxychloroquine (PLAQUENIL) 200 MG tablet 1 tablet daily.      . Ibuprofen-Diphenhydramine Cit (ADVIL PM PO) Take by mouth. As needed.      . Iron-Vitamins (GERITOL PO) Take by mouth.      . levothyroxine (SYNTHROID, LEVOTHROID) 50 MCG tablet Take 50 mcg by mouth daily.      . Linaclotide (LINZESS) 145 MCG CAPS capsule Take 145 mcg by mouth. Once a week as  needed.      . mometasone (ELOCON) 0.1 % cream As needed.      . Mouthwashes (BIOTENE DRY MOUTH MT) Use as directed in the mouth or throat.      Marland Kitchen omeprazole (PRILOSEC) 20 MG capsule Take 20 mg by mouth daily.      Marland Kitchen PREDNISONE PO Take 1 tablet by mouth daily. 3 mg- tapering off      . Probiotic Product (PROBIOTIC DAILY PO) Take 1 tablet by mouth daily.       No current facility-administered medications for this visit.   No Known Allergies   Review of Systems: Positive for sleeping problems . All other systems reviewed and negative except where noted in HPI.    Physical Exam: BP 110/70  Pulse 60  Ht 5' 4.25" (1.632 m)  Wt 124 lb 6.4 oz (56.427 kg)  BMI 21.19 kg/m2 Constitutional: Pleasant, thin white female in no acute distress. HEENT: Normocephalic and atraumatic. Conjunctivae are normal. No scleral  icterus. Neck supple.  Cardiovascular: Normal rate, regular rhythm.  Pulmonary/chest: Effort normal and breath sounds normal. No wheezing, rales or rhonchi. Abdominal: Soft, nondistended, nontender. Bowel sounds active throughout. There are no masses palpable. No hepatomegaly. Extremities: no edema Lymphadenopathy: No cervical adenopathy noted. Neurological: Alert and oriented to person place and time. Skin: Skin is warm and dry. No rashes noted. Psychiatric: Normal mood and affect. Behavior is normal.   ASSESSMENT AND PLAN:   pleasant 80 year old female with a two-month history of early satiety, postprandial bloating associated with mild weight loss. Patient tells me that her symptoms have significantly improved after changes in her blood pressure medication earlier this month. She is supplementing diet with Boost. Patient would like to hold off on further workup since she is doing much better now. Exam unremarkable, she looks great. Patient will call us for recurrent or new GI symptoms which will warrant further testing.

## 2013-09-20 NOTE — Patient Instructions (Signed)
Please follow up as needed 

## 2013-09-20 NOTE — Progress Notes (Signed)
Agree with plan at this time

## 2013-09-25 ENCOUNTER — Telehealth: Payer: Self-pay | Admitting: *Deleted

## 2013-09-25 NOTE — Telephone Encounter (Signed)
Left a vm for the patient's son Gerald Stabs to call the office, was trying to schedule an appointment for tomorrow for the patient to come in and discuss test results with Dr. Brett Fairy.

## 2013-09-25 NOTE — Telephone Encounter (Signed)
An appointment was scheduled for the patient on July 29 at 3:30 pm with Dr. Brett Fairy.

## 2013-09-25 NOTE — Telephone Encounter (Signed)
Patient returning call to Tish Frederickson, please call patient and advise.

## 2013-09-25 NOTE — Telephone Encounter (Signed)
Dr. Brett Fairy would like for the patient to come in to see her to go over the MRI results, as soon as possible.  Left a vm for the patient to call the office.

## 2013-09-26 ENCOUNTER — Ambulatory Visit (INDEPENDENT_AMBULATORY_CARE_PROVIDER_SITE_OTHER): Payer: Medicare Other | Admitting: Neurology

## 2013-09-26 ENCOUNTER — Encounter: Payer: Self-pay | Admitting: Neurology

## 2013-09-26 VITALS — BP 126/68 | HR 75 | Wt 125.0 lb

## 2013-09-26 DIAGNOSIS — G478 Other sleep disorders: Secondary | ICD-10-CM

## 2013-09-26 DIAGNOSIS — F514 Sleep terrors [night terrors]: Secondary | ICD-10-CM | POA: Insufficient documentation

## 2013-09-26 MED ORDER — CLONAZEPAM 0.5 MG PO TABS: 0.2500 mg | ORAL_TABLET | Freq: Every day | ORAL | Status: DC

## 2013-09-26 NOTE — Progress Notes (Signed)
[facility]  Provider:  Larey Seat, M D  Referring Provider: Sheela Stack, MD Primary Care Physician:  Sheela Stack, MD  Chief Complaint  Patient presents with  . Follow-up    Room 11  . Results    HPI:  Gloria Anderson is a 80 y.o. female  Is seen here as a referral  from Dr. Forde Dandy for an evaluation of possible night terrors;  The patient sleep study from 08-05-13 revealed no apnea, no oxygen desaturation, no irregular heart beats, and only took clusters of limb movements that seem to be related to discomfort with sleeping on the left side. She had no periodic limb movements during REM sleep. There was a prolonged period of slow-wave sleep at around 2 AM during which nor night terrors sleep walking or sleep talking was ordered.  The patient reports today that she had another spell- a feeling as if something brushes up on her scalp, or behind her head.  Since the patient has a long-standing bruit that she can hear at night and that bothers her( tinnitus) I have also suggested to do an MRA of the brain , as I am concerned that they're tortuous vessels , which  may be affecting the left frontal lobe perfusion. This could be a trigger for a nocturnal event as described by the patient clinically.  Her MRi documented to my surprise exactly that ! An intracerebral  Venous- cavernous  malformation. We are now meeting on 09-26-13 to discuss the results and documented the findings. She had more Night terrors the last weeks. The gabapentin  gave her diplopia.  I suggest now Topiramate, but she reminded me of involuntary weight loss and loss of apetite.  I will try a very low dose of KLONOPIN.   The patient should watch her BP and avoid straining , pressure inducing activities.        Last visit:   The patient,  a caucasian right handed female , originally from [place], reports vivid dreams, with fearful frightful content.  Her dreams have let her to act out  some of the dream content, she wakes with her arms extended and with palpitations, tachycardia form fright. She kicks and fell out of bed, yelling, calling out . Following  one dream last year,  she injured her face in a fall . She begun covering all sharp edges.  She has polymyalgia rheumatica and was treated with prednisone , gaining weight through this time.  She goes to bed around 11.15 Pm, after watching TV in her living room. Falls asleep promptly , and wakes at 12.15 hearing a noise , that seemed to arise form behind her headboard- the noise goes away once she is awake. She could fall asleep again. Her acting out of dreams arises at 4 AM or later in the last stages of sleep and gets more frequently over the last year.  She rises at 8.30, the alarm wakes her at 8.  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  No childhood sleep disorder history. Usually a 7 -8 hour sleeper , refreshed.  The patient is widowed for 12 years, has adult children and grandchildren.  Review of Systems: Out of a complete 14 system review, the patient complains of only the following symptoms, and all other reviewed systems are negative. Acting out dreams, yelling but not leaving the bed.  History   Social History  . Marital Status: Widowed    Spouse Name: N/A    Number of Children:  2  . Years of Education: HS   Occupational History  .  [employer]   Social History Main Topics  . Smoking status: Never Smoker   . Smokeless tobacco: Never Used  . Alcohol Use: Yes     Comment: rarely  . Drug Use: No  . Sexual Activity: Not on file   Other Topics Concern  . Not on file   Social History Narrative   Patient is widowed.   Patient has two children.   Patient does not drink any caffeine.    Patient has a high school education.   Patient is right-handed.             Family History  Problem Relation Age of Onset  . CVA Father   . Tuberculosis Mother   . Lung cancer Brother     Past Medical History  Diagnosis  Date  . Hypertension   . GERD (gastroesophageal reflux disease)   . Arthritis   . Hypothyroidism   . Osteoporosis   . Depression   . Polymyalgia     Past Surgical History  Procedure Laterality Date  . Shoulder arthroscopy  2007    rt   . Nasal sinus surgery    . Tonsillectomy    . Colonoscopy w/ polypectomy    . Eye surgery      both cataracts  . Trigger finger release  03/19/2011    Procedure: RELEASE TRIGGER FINGER/A-1 PULLEY;  Surgeon: Cammie Sickle., MD;  Location: [facility];  Service: Orthopedics;  Laterality: Right;  Procedure:  Release Right Long and Ring Trigger Fingers, Release Left Long Trigger Finger, Injection Left Long Proximal Phalangeal Joint    Current Outpatient Prescriptions  Medication Sig Dispense Refill  . ALPRAZolam (XANAX) 0.25 MG tablet Take 0.25 mg by mouth at bedtime as needed for anxiety. 1/2 tablet as needed      . aspirin 81 MG tablet Take 81 mg by mouth daily.       . B Complex-C-Zn-Folic Acid (VITALINE BIOTIN FORTE/ZINC PO) Take 1 tablet by mouth daily.      . benazepril (LOTENSIN) 20 MG tablet Take 20 mg by mouth daily.      . calcium carbonate (OS-CAL) 600 MG TABS tablet Take 600 mg by mouth 2 (two) times daily with a meal.      . carvedilol (COREG) 3.125 MG tablet 2 tablets 2 (two) times daily with a meal.       . Cholecalciferol (VITAMIN D PO) Take 1 tablet by mouth daily. 500 mg daily      . Docusate Calcium (STOOL SOFTENER PO) Take 1 tablet by mouth daily. 200 mg      . fish oil-omega-3 fatty acids 1000 MG capsule Take 2 g by mouth daily.      . Garlic 8546 MG TBEC Take 1 tablet by mouth daily.      . hydroxychloroquine (PLAQUENIL) 200 MG tablet 1 tablet daily.      . Ibuprofen-Diphenhydramine Cit (ADVIL PM PO) Take by mouth. As needed.      . Iron-Vitamins (GERITOL PO) Take by mouth.      . levothyroxine (SYNTHROID, LEVOTHROID) 50 MCG tablet Take 50 mcg by mouth daily.      . Linaclotide (LINZESS) 145 MCG CAPS capsule  Take 145 mcg by mouth. Once a week as needed.      . mometasone (ELOCON) 0.1 % cream As needed.      . Mouthwashes (BIOTENE DRY MOUTH  MT) Use as directed in the mouth or throat.      Marland Kitchen omeprazole (PRILOSEC) 20 MG capsule Take 20 mg by mouth daily.      Marland Kitchen PREDNISONE PO Take 1 tablet by mouth daily. 3 mg- tapering off      . Probiotic Product (PROBIOTIC DAILY PO) Take 1 tablet by mouth daily.       No current facility-administered medications for this visit.    Allergies as of 09/26/2013  . (No Known Allergies)    Vitals: BP 126/68  Pulse 75  Wt 125 lb (56.7 kg) Last Weight:  Wt Readings from Last 1 Encounters:  09/26/13 125 lb (56.7 kg)   Last Height:   Ht Readings from Last 1 Encounters:  09/20/13 5' 4.25" (1.632 m)    Physical exam:  General: The patient is awake, alert and appears not in acute distress. The patient is well groomed. Head: Normocephalic, atraumatic. Neck is supple. Mallampati 3 , retrognathia , neck circumference: 14 ,25 inches.  Cardiovascular:  Regular rate and rhythm , without  murmurs , has a  left sided  carotid bruit, no distended neck veins. Respiratory: Lungs are clear to auscultation. Skin:  Without evidence of edema, or rash Trunk:  normal posture.  Neurologic exam : The patient is awake and alert, oriented to place and time.  Memory subjective described as intact. There is a normal attention span & concentration ability. Speech is fluent without dysarthria, dysphonia or aphasia. Mood and affect are appropriate.  Cranial nerves: Pupils are equal and briskly reactive to light. Funduscopic exam without evidence of pallor or edema. Extraocular movements  in vertical and horizontal planes intact and without nystagmus. Visual fields by finger perimetry are intact. Hearing to finger rub intact.  Facial sensation intact to fine touch. Facial motor strength is symmetric and tongue and uvula move midline.  Motor exam:  Normal tone , muscle bulk and  symmetric normal strength in all extremities.  Sensory:  Fine touch, pinprick and vibration were tested in all extremities. Proprioception is  normal.  Coordination: Rapid alternating movements in the fingers/hands is tested and normal. Finger-to-nose maneuver tested and normal without evidence of ataxia, dysmetria or tremor.  Gait and station: Patient walks without assistive device . Deep tendon reflexes: in the  upper and lower extremities are symmetric and intact. Babinski maneuver response is  downgoing.   Assessment:  After physical and neurologic examination, review of laboratory studies, imaging, neurophysiology testing and pre-existing records, assessment is :  1) suspected REM BD was not confirmed, no PLMs in REM sleep- She has no PD features or symptoms.  2) no apnea.  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   The bruit in her left temple keeps her form sleeping on the side. She has 3 carotid dopplers over 25 years, diagnosed with  tortious vessel.  MRI brain was normal 4 year ago ( Dr. Carloyn Manner)  No MRA was done at the time , I like to rule out a vascular abnormality at the frontal lobe.   Her MRi documented to my surprise exactly that ! An intracerebral  Venous- cavernous  malformation. We are now meeting on 09-26-13 to discuss the results and documented the findings. She had more Night terrors the last weeks. The gabapentin  gave her diplopia.  I suggest now Topiramate, but she reminded me of  involuntary weight loss and loss of apetite. I will try a very low dose of KLONOPIN.  The patient should watch her BP and avoid straining , pressure inducing activities.

## 2013-09-27 ENCOUNTER — Telehealth: Payer: Self-pay | Admitting: Neurology

## 2013-09-27 NOTE — Telephone Encounter (Signed)
Patient calling to state that when she was in for her appointment yesterday she forgot her AVS in the room and would like it mailed to her or she can come and pick it up, since Dr. Brett Fairy had written her some notes and underlined some things. Please return call to patient and advise.

## 2013-09-27 NOTE — Telephone Encounter (Signed)
I spoke to the patient and she would like the MRI with notes from Dr. Brett Fairy mailed to her.  Patient requested two copies and they were both mailed.

## 2013-09-27 NOTE — Telephone Encounter (Signed)
Patient's son would like to discuss his mother's MRI results from yesterday.  Please call him today at the number listed [phone].

## 2013-09-27 NOTE — Telephone Encounter (Signed)
Patient's son Gerald Stabs can be reached at [phone].

## 2013-09-27 NOTE — Telephone Encounter (Signed)
i cannot do this today, but tomorrow. CD

## 2013-09-27 NOTE — Telephone Encounter (Signed)
Patient's son Gerald Stabs returning call to [place] Dyer, please call patient back and advise.

## 2013-10-03 ENCOUNTER — Telehealth: Payer: Self-pay | Admitting: Neurology

## 2013-10-03 NOTE — Telephone Encounter (Signed)
Left a message for the patient, per Dr. Brett Fairy to continue her medications and she did not want to increase any of her medications at this time.  Dr. Brett Fairy advised the patient to keep a diary of what is happening and to call in a couple of days with any update.

## 2013-10-03 NOTE — Telephone Encounter (Signed)
Patient calling to state that she was still having night terrors yesterday despite being on medication, please call patient and advise.

## 2013-10-03 NOTE — Telephone Encounter (Signed)
Patient is taking Neurontin  300 at night and Melatonin.  The Klonopin was added at her appointment on 07/29 taking 1/2 of a 25 mg tablet.

## 2013-10-08 NOTE — Telephone Encounter (Signed)
Patient requesting a return call to give up date with diary and what's happening.  Please return call.  Thanks

## 2013-10-08 NOTE — Telephone Encounter (Signed)
Dr. Brett Fairy did speak to the patient's son, last week.

## 2013-10-08 NOTE — Telephone Encounter (Signed)
2.5 mg tab of klonopin without success at this point. Feels groggy , started an hour before bedtime, to reduce the morning hang over.   parasomnia on the 4th, screaming loudly.  Naps every afternoon.  Keppra. / melatonin.

## 2013-10-08 NOTE — Telephone Encounter (Signed)
Patient states that on the July 30th, she started taking the Klonopin.  Her first episode was on August 4th, had a real dream with screaming.  2nd episode on August 7th, she heard her alarm go off in her head at 6:30 am but it wasn't the real alarm.  3rd episode was on August 10th at 2:00 am, heard a noise in the other room that woke her up.  Patient also wants to know if it is safe for her to exercise at the gym on the treadmills, water aerobics and on the machines?  Please call her back today after 3 pm to discuss.

## 2013-10-16 ENCOUNTER — Telehealth: Payer: Self-pay | Admitting: Neurology

## 2013-10-16 NOTE — Telephone Encounter (Signed)
Dear [name],  This is my patient with a venous vascular malformation at several CNS locations and longstanding  unusual sleep behaviors.  I am now , after MRI , considering these possible nocturnal seizures.  Mrs. Gloria Anderson  is widowed, a lovely [place] with 2 concerned sons. She lives alone .  She has at time violent spells at night, breaking a night stand , but some are more of of deja-vu or , better, " a sense of doom " even in daytime.  Described as a feeling of an airstream touched her, that someone is in the room with her , that she is watched.  Would you consider a 24 hrs. or even longer EEG for her ?   Thank you so much for taking time on the phone with me , Larey Seat .

## 2013-10-17 NOTE — Telephone Encounter (Signed)
Pt is sch for 01-07-14 for a 48 hour eeg and is on a cancellation list dana

## 2013-10-17 NOTE — Telephone Encounter (Signed)
Pls schedule patient for 48-hour EEG, thanks!

## 2013-10-17 NOTE — Telephone Encounter (Signed)
Yes, that's O, patient is on KLONOPIN.

## 2013-10-17 NOTE — Telephone Encounter (Signed)
Hi Carmen,  Thank you for speaking with me as well about the MSLT.  I think either a 48-hour or 72-hour would be better for your patient.  The only thing is our next 48-hour opening is not til October, I will have them put her on a cancellation list also, is that okay?  Thanks, [name]

## 2013-10-22 NOTE — Telephone Encounter (Signed)
Patient calling and questioning why she's having a 48 hr EEG.  Please call anytime and advise, if not available please leave message on vm.

## 2013-10-23 NOTE — Telephone Encounter (Signed)
Patient was informed that the tests would be done to rule out noctural seizures.  Patient verbalized understanding.

## 2013-11-02 ENCOUNTER — Telehealth: Payer: Self-pay | Admitting: Neurology

## 2013-11-02 NOTE — Telephone Encounter (Signed)
Pt called requesting to speak with someone who can give her more info regarding the EEG. Please call pt [phone]

## 2013-11-30 ENCOUNTER — Ambulatory Visit: Payer: Medicare Other | Admitting: Neurology

## 2013-12-04 ENCOUNTER — Ambulatory Visit (INDEPENDENT_AMBULATORY_CARE_PROVIDER_SITE_OTHER): Payer: Medicare Other | Admitting: Neurology

## 2013-12-04 ENCOUNTER — Encounter (INDEPENDENT_AMBULATORY_CARE_PROVIDER_SITE_OTHER): Payer: Self-pay

## 2013-12-04 ENCOUNTER — Encounter: Payer: Self-pay | Admitting: Neurology

## 2013-12-04 VITALS — BP 118/61 | HR 67 | Temp 98.0°F | Ht 64.0 in | Wt 126.5 lb

## 2013-12-04 DIAGNOSIS — Q048 Other specified congenital malformations of brain: Secondary | ICD-10-CM

## 2013-12-04 DIAGNOSIS — Q283 Other malformations of cerebral vessels: Secondary | ICD-10-CM

## 2013-12-04 NOTE — Progress Notes (Signed)
[facility]  Provider:  Larey Seat, M D  Referring Provider: Sheela Stack, MD Primary Care Physician:  Sheela Stack, MD  Chief Complaint  Patient presents with  . RV    RM 10    HPI:  Gloria Anderson is a 80 y.o. female was seen here as a referral  from Dr. Forde Dandy for an evaluation of possible night terrors;     Her night terrors have responded to the medication, and her spells are rare now. I have spoken to Dr. Delice Lesch about the possible component of cavernous vascular malformations producing a seizure event.  She was very receptive and we agreed a multi-day portable EEG would be in order. Some of the spells were not classic for night terrors, the feeling that something or someone wiped her forehead or pulled a band over her scalp. I ordered a 48 hour study but the patient agrees today to a 72 hour study. She is doing well on Klonopin. She gained weight on klonopin, which she likes.   LAST visit note :   The patient sleep study from 08-05-13 revealed no apnea, no oxygen desaturation, no irregular heart beats, and only took clusters of limb movements that seem to be related to discomfort with sleeping on the left side. She had no periodic limb movements during REM sleep. There was a prolonged period of slow-wave sleep at around 2 AM during which nor night terrors sleep walking or sleep talking was ordered.  The patient reported  that she had another spell- a feeling as if something brushes up on her scalp, or behind her head.  Since the patient has a long-standing bruit that she can hear at night and that bothers her( tinnitus) I have also suggested to do an MRA of the brain , as I am concerned that they're tortuous vessels , which  may be affecting the left frontal lobe perfusion. This could be a trigger for a nocturnal event as described by the patient clinically. Her MRi documented to my surprise exactly that ! An intracerebral  Venous- cavernous   malformation. We are now meeting on 09-26-13 to discuss the results and documented the findings. She had more Night terrors the last weeks. The gabapentin gave her diplopia.  I suggest now Topiramate, but she reminded me of involuntary weight loss and loss of apetite. I will try a very low dose of KLONOPIN.  The patient should watch her BP and avoid straining , pressure inducing activities.        Last visit:   The patient,  a caucasian right handed female , originally from [place], reports vivid dreams, with fearful frightful content.  Her dreams have let her to act out some of the dream content, she wakes with her arms extended and with palpitations, tachycardia form fright. She kicks and fell out of bed, yelling, calling out . Following  one dream last year,  she injured her face in a fall . She begun covering all sharp edges.  She has polymyalgia rheumatica and was treated with prednisone , gaining weight through this time.  She goes to bed around 11.15 Pm, after watching TV in her living room. Falls asleep promptly , and wakes at 12.15 hearing a noise , that seemed to arise form behind her headboard- the noise goes away once she is awake. She could fall asleep again. Her acting out of dreams arises at 4 AM or later in the last stages of sleep and gets more frequently  over the last year.  She rises at 8.30, the alarm wakes her at 8.  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  No childhood sleep disorder history. Usually a 7 -8 hour sleeper , refreshed.  The patient is widowed for 12 years, has adult children and grandchildren. The bruit in her left temple keeps her form sleeping on the side.  She has 3 carotid dopplers over 25 years, diagnosed with  tortious vessel.  MRI brain was normal 4 year ago ( Dr. Carloyn Manner)  No MRA was done at the time , I like to rule out a vascular abnormality at the frontal lob  Review of Systems: Out of a complete 14 system review, the patient complains of only the following  symptoms, and all other reviewed systems are negative. Acting out dreams, yelling but not leaving the bed.  History   Social History  . Marital Status: Widowed    Spouse Name: N/A    Number of Children: 2  . Years of Education: HS   Occupational History  . RETIRED [employer]   Social History Main Topics  . Smoking status: Never Smoker   . Smokeless tobacco: Never Used  . Alcohol Use: Yes     Comment: rarely  . Drug Use: No  . Sexual Activity: Not on file   Other Topics Concern  . Not on file   Social History Narrative   Patient is widowed.   Patient has two children.   Patient does not drink any caffeine.    Patient has a high school education.   Patient is right-handed.             Family History  Problem Relation Age of Onset  . CVA Father   . Tuberculosis Mother   . Lung cancer Brother     Past Medical History  Diagnosis Date  . Hypertension   . GERD (gastroesophageal reflux disease)   . Arthritis   . Hypothyroidism   . Osteoporosis   . Depression   . Polymyalgia     Past Surgical History  Procedure Laterality Date  . Shoulder arthroscopy  2007    rt   . Nasal sinus surgery    . Tonsillectomy    . Colonoscopy w/ polypectomy    . Eye surgery      both cataracts  . Trigger finger release  03/19/2011    Procedure: RELEASE TRIGGER FINGER/A-1 PULLEY;  Surgeon: Cammie Sickle., MD;  Location: [facility];  Service: Orthopedics;  Laterality: Right;  Procedure:  Release Right Long and Ring Trigger Fingers, Release Left Long Trigger Finger, Injection Left Long Proximal Phalangeal Joint    Current Outpatient Prescriptions  Medication Sig Dispense Refill  . ALPRAZolam (XANAX) 0.25 MG tablet Take 0.25 mg by mouth at bedtime as needed for anxiety. 1/2 tablet as needed      . aspirin 81 MG tablet Take 81 mg by mouth daily.       . B Complex-C-Zn-Folic Acid (VITALINE BIOTIN FORTE/ZINC PO) Take 1 tablet by mouth daily.      . benazepril  (LOTENSIN) 20 MG tablet Take 20 mg by mouth daily.      . calcium carbonate (OS-CAL) 600 MG TABS tablet Take 600 mg by mouth 2 (two) times daily with a meal.      . carvedilol (COREG) 3.125 MG tablet 2 tablets 2 (two) times daily with a meal.       . Cholecalciferol (VITAMIN D PO) Take 1  tablet by mouth daily. 500 mg daily      . clonazePAM (KLONOPIN) 0.5 MG tablet Take 0.5 tablets (0.25 mg total) by mouth at bedtime.  30 tablet  2  . Docusate Calcium (STOOL SOFTENER PO) Take 1 tablet by mouth daily. 200 mg      . fish oil-omega-3 fatty acids 1000 MG capsule Take 2 g by mouth daily.      . Garlic 0630 MG TBEC Take 1 tablet by mouth daily.      . hydroxychloroquine (PLAQUENIL) 200 MG tablet 1 tablet daily.      . Ibuprofen-Diphenhydramine Cit (ADVIL PM PO) Take by mouth. As needed.      . Iron-Vitamins (GERITOL PO) Take by mouth.      . levothyroxine (SYNTHROID, LEVOTHROID) 50 MCG tablet Take 50 mcg by mouth daily.      . Linaclotide (LINZESS) 145 MCG CAPS capsule Take 145 mcg by mouth. Once a week as needed.      . mometasone (ELOCON) 0.1 % cream As needed.      . Mouthwashes (BIOTENE DRY MOUTH MT) Use as directed in the mouth or throat.      Marland Kitchen omeprazole (PRILOSEC) 20 MG capsule Take 20 mg by mouth daily.      Marland Kitchen PREDNISONE PO Take 1 tablet by mouth daily. 3 mg- tapering off      . Probiotic Product (PROBIOTIC DAILY PO) Take 1 tablet by mouth daily.       No current facility-administered medications for this visit.    Allergies as of 12/04/2013  . (No Known Allergies)    Vitals: BP 118/61  Pulse 67  Temp(Src) 98 F (36.7 C)  Ht 5\' 4"  (1.626 m)  Wt 126 lb 8 oz (57.38 kg)  BMI 21.70 kg/m2 Last Weight:  Wt Readings from Last 1 Encounters:  12/04/13 126 lb 8 oz (57.38 kg)   Last Height:   Ht Readings from Last 1 Encounters:  12/04/13 5\' 4"  (1.626 m)    Physical exam:  General: The patient is awake, alert and appears not in acute distress. The patient is well groomed. Head:  Normocephalic, atraumatic. Neck is supple. Mallampati 3 , retrognathia , neck circumference: 14 ,25 inches.  Cardiovascular:  Regular rate and rhythm , without  murmurs , has a  left sided  carotid bruit, no distended neck veins. Respiratory: Lungs are clear to auscultation. Skin:  Without evidence of edema, or rash Trunk:  normal posture.  Neurologic exam : The patient is awake and alert, oriented to place and time.  Memory subjective described as intact. There is a normal attention span & concentration ability. Speech is fluent without dysarthria, dysphonia or aphasia. Mood and affect are appropriate.  Cranial nerves: Pupils are equal and briskly reactive to light. Funduscopic exam without evidence of pallor or edema. Extraocular movements  in vertical and horizontal planes intact and without nystagmus. Visual fields by finger perimetry are intact. Hearing to finger rub intact.  Facial sensation intact to fine touch. Facial motor strength is symmetric and tongue and uvula move midline.  Motor exam:  Normal tone , muscle bulk and symmetric normal strength in all extremities.  Sensory:  Fine touch, pinprick and vibration were  normal.  Coordination: Rapid alternating movements in the fingers/hands is tested and normal.  Finger-to-nose maneuver tested and normal without evidence of ataxia, dysmetria or tremor.  Gait and station: Patient walks without assistive device .  Deep tendon reflexes: in the upper and lower extremities  2 plus. ,Babinski maneuver response is downgoing.   Assessment:  After physical and neurologic examination, review of laboratory studies, imaging, neurophysiology testing and pre-existing records, assessment is :  1) suspected REM BD was not confirmed, no PLMs in REM sleep- She has no PD features or symptoms.  2) no apnea.  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    Plan:  Treatment plan and additional workup :  Neurontin gave her diplopia- stay on klonopin, low dose 0.25 mg nightly  , will Get  72 hour EEG with Dr. Delice Lesch.   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  I suggest now Topiramate, but she reminded me of involuntary weight loss and loss of apetite. I will try a very low dose of KLONOPIN.  The patient should watch her BP and avoid straining , pressure inducing activities.

## 2013-12-06 ENCOUNTER — Telehealth: Payer: Self-pay | Admitting: *Deleted

## 2013-12-06 NOTE — Telephone Encounter (Signed)
Please use the same order for prolonged EEG , now 72 hours.

## 2013-12-06 NOTE — Telephone Encounter (Signed)
I called and spoke to Seth Bake about needing order for 72 our EEG.   Need order?  Yes.

## 2013-12-10 ENCOUNTER — Other Ambulatory Visit: Payer: Self-pay | Admitting: *Deleted

## 2013-12-10 DIAGNOSIS — Q283 Other malformations of cerebral vessels: Secondary | ICD-10-CM

## 2013-12-12 NOTE — Telephone Encounter (Signed)
I called and spoke to [place] with Dr. Amparo Bristol office and she will take care of changing to EEG 72 hours.  She will call pt to let her know.

## 2013-12-17 ENCOUNTER — Telehealth: Payer: Self-pay | Admitting: Neurology

## 2013-12-17 NOTE — Telephone Encounter (Signed)
She can take her benadryl, yes. Or tylenol PM.  She should reduce her medication to 50 % of the dose. Klonopin. CD

## 2013-12-17 NOTE — Telephone Encounter (Signed)
Patient calling to state that she has a 72 hour EEG scheduled, wants to know when she should stop her medication and when she can start it back up again. Patient also asking if it is safe to take Advil PM during the EEG to help with sleep. Please return call and advise.

## 2013-12-18 NOTE — Telephone Encounter (Signed)
After consulting Dr. Brett Fairy,  I called pt and spoke to her and relayed that she can take benadryl or tylenol pm, and she is to decrease her dose of klonopin to 1/4 tablet night before EEG (on 01-07-14) and subsequent days (until test done) then can go back to her regular 1/2 tablet dose.  She verbalized understanding.

## 2014-01-04 ENCOUNTER — Telehealth: Payer: Self-pay | Admitting: Neurology

## 2014-01-04 ENCOUNTER — Encounter: Payer: Self-pay | Admitting: Neurology

## 2014-01-04 NOTE — Telephone Encounter (Signed)
Attempted to leave message for patient regarding rescheduling 01/31/14 appointment per Dr. Edwena Felty schedule, line was busy, tried the work number but the woman who answered stated she did not recognize the name. Printed and mailed letter with new appointment time.

## 2014-01-07 ENCOUNTER — Other Ambulatory Visit: Payer: Medicare Other

## 2014-01-07 ENCOUNTER — Ambulatory Visit (INDEPENDENT_AMBULATORY_CARE_PROVIDER_SITE_OTHER): Payer: Medicare Other | Admitting: Neurology

## 2014-01-07 DIAGNOSIS — Q283 Other malformations of cerebral vessels: Secondary | ICD-10-CM

## 2014-01-21 ENCOUNTER — Telehealth: Payer: Self-pay | Admitting: Neurology

## 2014-01-21 NOTE — Procedures (Signed)
ELECTROENCEPHALOGRAM REPORT  Dates of Recording: 01/07/2014 to 01/09/2014  Patient's Name: Gloria Anderson MRN: [id number] Date of Birth: 1934/02/04  Referring Provider: Dr. Asencion Partridge Dohmeier  Procedure: 72-hour ambulatory EEG  History: This is a 80 year old woman with episodes where she would act out some of the dream content, waking up with her arms extended and with palpitations, tachycardia from fright. She kicks and has fallen out of bed, yelling, calling out, and has injured herself. She also has episodes of feeling that something or someone wiped her forehead or pulled a band over her scalp.  Medications: clonazepam, Synthroid, Lotensin, Plaquenil, aspirin  Technical Summary: This is a 72-hour multichannel digital EEG recording measured by the international 10-20 system with electrodes applied with paste and impedances below 5000 ohms performed as portable with EKG monitoring.  The digital EEG was referentially recorded, reformatted, and digitally filtered in a variety of bipolar and referential montages for optimal display.    DESCRIPTION OF RECORDING: During maximal wakefulness, the background activity consisted of a symmetric 9.5-10 Hz posterior dominant rhythm which was reactive to eye opening.  There were no epileptiform discharges or focal slowing seen in wakefulness.  During the recording, the patient progresses through wakefulness, drowsiness, and Stage 2 sleep. During drowsiness and sleep, there is an increase in theta and delta slowing, with shifting asymmetry over the bilateral temporal regions. During stage 2 sleep, there are rare low to medium voltage sharp transients seen over the frontopolar regions, maximal over the left frontopolar region.  Events: There were no push button events. Patient did not complete diary and did not report any typical symptoms.  There were no electrographic seizures seen.  EKG lead was unremarkable.  IMPRESSION: This 72-hour ambulatory EEG  study is abnormal due to rare sharp transients seen over the frontopolar regions, maximal over the left frontopolar region, seen exclusively in sleep.  CLINICAL CORRELATION: The sharp transients noted over the frontopolar regions, maximal over the left frontopolar region, were rare over the course of the 72-hour study, and may indicate possible epileptogenic potential in this region.  There were no clinical or electrographic seizures captured. Typical events were not reported. If further clinical questions remain, inpatient video EEG monitoring may be helpful.   Ellouise Newer, M.D.

## 2014-01-21 NOTE — Telephone Encounter (Signed)
Patient calling to check on whether her 72 hour EEG results are in, wants to know before Thanksgiving since she will be with her family and they will be asking her questions, please return call and advise.

## 2014-01-21 NOTE — Telephone Encounter (Signed)
I called and spoke to pt and let her know that the EEG did not show any seizures.  Will mail copy to her if questions Dr. Brett Fairy can address when she come in for RV 02-04-14.  Pt ok with this.  Reiterated no seizures.

## 2014-01-31 ENCOUNTER — Ambulatory Visit: Payer: Medicare Other | Admitting: Neurology

## 2014-02-04 ENCOUNTER — Ambulatory Visit: Payer: Medicare Other | Admitting: Neurology

## 2014-02-27 ENCOUNTER — Ambulatory Visit (INDEPENDENT_AMBULATORY_CARE_PROVIDER_SITE_OTHER): Payer: Medicare Other | Admitting: Neurology

## 2014-02-27 ENCOUNTER — Encounter: Payer: Self-pay | Admitting: Neurology

## 2014-02-27 VITALS — BP 145/62 | HR 75 | Ht 64.0 in | Wt 126.0 lb

## 2014-02-27 DIAGNOSIS — F514 Sleep terrors [night terrors]: Secondary | ICD-10-CM

## 2014-02-27 DIAGNOSIS — Q282 Arteriovenous malformation of cerebral vessels: Secondary | ICD-10-CM

## 2014-02-27 DIAGNOSIS — Q283 Other malformations of cerebral vessels: Secondary | ICD-10-CM

## 2014-02-27 MED ORDER — CLONAZEPAM 0.5 MG PO TABS: 0.2500 mg | ORAL_TABLET | Freq: Every day | ORAL | Status: DC

## 2014-02-27 NOTE — Progress Notes (Addendum)
[facility]  Provider:  Larey Seat, M D  Referring Provider: Sheela Stack, MD Primary Care Physician:  Sheela Stack, MD  Chief Complaint  Patient presents with  . RV night terrors/EEG result    Rm 11, friend    HPI:  Gloria Anderson is a 80 y.o. female was seen here as a referral  from Dr. Forde Dandy for an evaluation of possible night terrors;     Her night terrors have responded to the medication, and her spells are rare now. I have spoken to Dr. Delice Lesch about the possible component of cavernous vascular malformations producing a seizure event.  She was very receptive and we agreed a multi-day portable EEG would be in order. Some of the spells were not classic for night terrors, the feeling that something or someone wiped her forehead or pulled a band over her scalp. I ordered a 48 hour study but the patient agrees today to a 72 hour study. She is doing well on Klonopin. She gained weight on klonopin, which she likes.   LAST visit note :   The patient sleep study from 08-05-13 revealed no apnea, no oxygen desaturation, no irregular heart beats, and only took clusters of limb movements that seem to be related to discomfort with sleeping on the left side. She had no periodic limb movements during REM sleep. There was a prolonged period of slow-wave sleep at around 2 AM during which nor night terrors sleep walking or sleep talking was ordered.  The patient reported  that she had another spell- a feeling as if something brushes up on her scalp, or behind her head.  Since the patient has a long-standing bruit that she can hear at night and that bothers her( tinnitus) I have also suggested to do an MRA of the brain , as I am concerned that they're tortuous vessels , which  may be affecting the left frontal lobe perfusion. This could be a trigger for a nocturnal event as described by the patient clinically. Her MRi documented to my surprise exactly that ! An  intracerebral  Venous- cavernous  malformation. We are now meeting on 09-26-13 to discuss the results and documented the findings. She had more Night terrors the last weeks. The gabapentin gave her diplopia.  I suggest now Topiramate, but she reminded me of involuntary weight loss and loss of apetite. I will try a very low dose of KLONOPIN.  The patient should watch her BP and avoid straining , pressure inducing activities.    RV 02-27-14  Discussed EEG results, no epileptiform activity , but frontal sharps noted. These are of unclear significance.  See report. No change in meds.needed  Patient is free to travel to [place] in April.        Last visit:   The patient,  a caucasian right handed female , originally from [place], reports vivid dreams, with fearful frightful content.  Her dreams have let her to act out some of the dream content, she wakes with her arms extended and with palpitations, tachycardia form fright. She kicks and fell out of bed, yelling, calling out . Following  one dream last year,  she injured her face in a fall . She begun covering all sharp edges.  She has polymyalgia rheumatica and was treated with prednisone , gaining weight through this time.  She goes to bed around 11.15 Pm, after watching TV in her living room. Falls asleep promptly , and wakes at 12.15 hearing a noise ,  that seemed to arise form behind her headboard- the noise goes away once she is awake. She could fall asleep again. Her acting out of dreams arises at 4 AM or later in the last stages of sleep and gets more frequently over the last year.  She rises at 8.30, the alarm wakes her at 8.  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  No childhood sleep disorder history. Usually a 7 -8 hour sleeper , refreshed.  The patient is widowed for 12 years, has adult children and grandchildren. The bruit in her left temple keeps her form sleeping on the side.  She has 3 carotid dopplers over 25 years, diagnosed with  tortious  vessel.  MRI brain was normal 4 year ago ( Dr. Carloyn Manner)  No MRA was done at the time , I like to rule out a vascular abnormality at the frontal lob  Review of Systems: Out of a complete 14 system review, the patient complains of only the following symptoms, and all other reviewed systems are negative. Acting out dreams, yelling but not leaving the bed.  History   Social History  . Marital Status: Widowed    Spouse Name: N/A    Number of Children: 2  . Years of Education: HS   Occupational History  . RETIRED [employer]   Social History Main Topics  . Smoking status: Never Smoker   . Smokeless tobacco: Never Used  . Alcohol Use: Yes     Comment: rarely  . Drug Use: No  . Sexual Activity: Not on file   Other Topics Concern  . Not on file   Social History Narrative   Patient is widowed.   Patient has two children.   Patient does not drink any caffeine.    Patient has a high school education.   Patient is right-handed.             Family History  Problem Relation Age of Onset  . CVA Father   . Tuberculosis Mother   . Lung cancer Brother     Past Medical History  Diagnosis Date  . Hypertension   . GERD (gastroesophageal reflux disease)   . Arthritis   . Hypothyroidism   . Osteoporosis   . Depression   . Polymyalgia     Past Surgical History  Procedure Laterality Date  . Shoulder arthroscopy  2007    rt   . Nasal sinus surgery    . Tonsillectomy    . Colonoscopy w/ polypectomy    . Eye surgery      both cataracts  . Trigger finger release  03/19/2011    Procedure: RELEASE TRIGGER FINGER/A-1 PULLEY;  Surgeon: Cammie Sickle., MD;  Location: [place];  Service: Orthopedics;  Laterality: Right;  Procedure:  Release Right Long and Ring Trigger Fingers, Release Left Long Trigger Finger, Injection Left Long Proximal Phalangeal Joint    Current Outpatient Prescriptions  Medication Sig Dispense Refill  . aspirin 81 MG tablet Take 81 mg by  mouth daily.     . B Complex-C-Zn-Folic Acid (VITALINE BIOTIN FORTE/ZINC PO) Take 1 tablet by mouth daily.    . benazepril (LOTENSIN) 20 MG tablet Take 20 mg by mouth daily.    . calcium carbonate (OS-CAL) 600 MG TABS tablet Take 600 mg by mouth 2 (two) times daily with a meal.    . carvedilol (COREG) 3.125 MG tablet 2 tablets 2 (two) times daily with a meal.     .  Cholecalciferol (VITAMIN D PO) Take 1 tablet by mouth daily. 500 mg daily    . clonazePAM (KLONOPIN) 0.5 MG tablet Take 0.5 tablets (0.25 mg total) by mouth at bedtime. 90 tablet 1  . denosumab (PROLIA) 60 MG/ML SOLN injection Inject 60 mg into the skin every 6 (six) months. Administer in upper arm, thigh, or abdomen    . Docusate Calcium (STOOL SOFTENER PO) Take 1 tablet by mouth daily. 200 mg    . fish oil-omega-3 fatty acids 1000 MG capsule Take 2 g by mouth daily.    . Garlic 3419 MG TBEC Take 1 tablet by mouth daily.    . hydroxychloroquine (PLAQUENIL) 200 MG tablet 1 tablet daily.    . Ibuprofen-Diphenhydramine Cit (ADVIL PM PO) Take by mouth. As needed.    . Iron-Vitamins (GERITOL PO) Take by mouth.    . levothyroxine (SYNTHROID, LEVOTHROID) 50 MCG tablet Take 50 mcg by mouth daily.    . Linaclotide (LINZESS) 145 MCG CAPS capsule Take 145 mcg by mouth. Once a week as needed.    . Mouthwashes (BIOTENE DRY MOUTH MT) Use as directed in the mouth or throat.    Marland Kitchen omeprazole (PRILOSEC) 20 MG capsule Take 20 mg by mouth daily.    . Probiotic Product (PROBIOTIC DAILY PO) Take 1 tablet by mouth daily.    Marland Kitchen ALPRAZolam (XANAX) 0.25 MG tablet Take 0.25 mg by mouth at bedtime as needed for anxiety. 1/2 tablet as needed     No current facility-administered medications for this visit.    Allergies as of 02/27/2014  . (No Known Allergies)    Vitals: BP 145/62 mmHg  Pulse 75  Ht 5\' 4"  (1.626 m)  Wt 126 lb (57.153 kg)  BMI 21.62 kg/m2 Last Weight:  Wt Readings from Last 1 Encounters:  02/27/14 126 lb (57.153 kg)   Last Height:    Ht Readings from Last 1 Encounters:  02/27/14 5\' 4"  (1.626 m)    Physical exam:  General: The patient is awake, alert and appears not in acute distress. The patient is well groomed. Head: Normocephalic, atraumatic. Neck is supple. Mallampati 3 , retrognathia , neck circumference: 14 ,25 inches.  Cardiovascular:  Regular rate and rhythm , without  murmurs , has a  left sided  carotid bruit, no distended neck veins. Respiratory: Lungs are clear to auscultation. Skin:  Without evidence of edema, or rash Trunk:  normal posture.  Neurologic exam : The patient is awake and alert, oriented to place and time.  Memory subjective described as intact. There is a normal attention span & concentration ability. Speech is fluent without dysarthria, dysphonia or aphasia. Mood and affect are appropriate.  Cranial nerves: Pupils are equal and briskly reactive to light. Funduscopic exam without evidence of pallor or edema. Extraocular movements  in vertical and horizontal planes intact and without nystagmus. Visual fields by finger perimetry are intact. Hearing to finger rub intact.  Facial sensation intact to fine touch. Facial motor strength is symmetric and tongue and uvula move midline.  Motor exam:  Normal tone , muscle bulk and symmetric normal strength in all extremities.  Sensory:  Fine touch, pinprick and vibration were  normal.  Coordination: Rapid alternating movements in the fingers/hands is tested and normal.  Finger-to-nose maneuver tested and normal without evidence of ataxia, dysmetria or tremor.  Gait and station: Patient walks without assistive device .  Deep tendon reflexes: in the upper and lower extremities  2 plus. ,Babinski maneuver response is downgoing.  Assessment:  After physical and neurologic examination, review of laboratory studies, imaging, neurophysiology testing and pre-existing records, assessment is :  1) suspected REM BD was not confirmed, no PLMs in REM sleep-  She has no PD features or symptoms.  2) no apnea.  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    Plan:  Treatment plan and additional workup :   stay on klonopin, low dose 0.25 mg nightly. 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

## 2014-07-01 ENCOUNTER — Encounter: Payer: Self-pay | Admitting: Internal Medicine

## 2014-08-12 ENCOUNTER — Other Ambulatory Visit (HOSPITAL_COMMUNITY): Payer: Self-pay | Admitting: *Deleted

## 2014-08-12 ENCOUNTER — Encounter: Payer: Self-pay | Admitting: Neurology

## 2014-08-12 ENCOUNTER — Ambulatory Visit (INDEPENDENT_AMBULATORY_CARE_PROVIDER_SITE_OTHER): Payer: Medicare Other | Admitting: Neurology

## 2014-08-12 VITALS — BP 118/64 | HR 76 | Resp 18 | Ht 63.39 in | Wt 124.0 lb

## 2014-08-12 DIAGNOSIS — Q282 Arteriovenous malformation of cerebral vessels: Secondary | ICD-10-CM

## 2014-08-12 DIAGNOSIS — F514 Sleep terrors [night terrors]: Secondary | ICD-10-CM

## 2014-08-12 DIAGNOSIS — Q283 Other malformations of cerebral vessels: Secondary | ICD-10-CM | POA: Diagnosis not present

## 2014-08-12 MED ORDER — CLONAZEPAM 0.5 MG PO TABS: 0.2500 mg | ORAL_TABLET | Freq: Every day | ORAL | Status: DC

## 2014-08-12 NOTE — Progress Notes (Signed)
[facility]  Provider:  Larey Seat, M D  Referring Provider: [name], MD Primary Care Physician:  Sheela Stack, MD  Chief Complaint  Patient presents with  . Follow-up    rm 10, return pt, alone    HPI:  Gloria Anderson is a 81 y.o. female was seen here as a referral  from Dr. Forde Dandy for an evaluation of possible night terrors;     Her night terrors have responded to the medication, and her spells are rare now. She returned form [place] and one event "scared her sister to death "  I have spoken to Dr. Delice Lesch about the possible component of cavernous vascular malformations producing a seizure event.   I ordered a 48 hour study but the patient agrees today to a 72 hour study. This was normal-  She is doing well on Klonopin. She gained weight on klonopin, a fact she likes. She reports vertigo, dizzines with rapid head movementns and bending down. Her Hypertension may play a role. She is reporting that she measured BP of 100 over 60 mm Hg. She may have a ned for reduction. The spells happen any time of day. Onset over the last 12 month. Her eye feel as if pulsating - can this be the AVM? /     09-26-13 The patient's  sleep study from 08-05-13 revealed no apnea, no oxygen desaturation, no irregular heart beats, and only took clusters of limb movements that seem to be related to discomfort with sleeping on the left side. She had no periodic limb movements during REM sleep. There was a prolonged period of slow-wave sleep at around 2 AM during which nor night terrors sleep walking or sleep talking was ordered. The patient reported  that she had another spell- a feeling as if something brushes up on her scalp, or behind her head.  Since the patient has a long-standing bruit that she can hear at night and that bothers her( tinnitus) I have also suggested to do an MRA of the brain , as I am concerned that they're tortuous vessels , which  may be affecting the left  frontal lobe perfusion. This could be a trigger for a nocturnal event as described by the patient clinically. Her MRi documented to my surprise exactly that ! An intracerebral  Venous- cavernous  malformation. We are now meeting on 09-26-13 to discuss the results and documented the findings. She had more Night terrors the last weeks. The gabapentin gave her diplopia. I suggest now Topiramate, but she reminded me of involuntary weight loss and loss of apetite. I will try a very low dose of KLONOPIN.  The patient should watch her BP and avoid straining , pressure inducing activities.    RV 02-27-14  Discussed EEG results, no epileptiform activity , but frontal sharps noted. These are of unclear significance.  See report. No change in meds.needed  Patient is free to travel to [place] in April.  The patient,  a caucasian right handed female , originally from [place], reports vivid dreams, with fearful frightful content.  Her dreams have let her to act out some of the dream content, she wakes with her arms extended and with palpitations, tachycardia form fright. She kicks and fell out of bed, yelling, calling out . Following  one dream last year,  she injured her face in a fall . She begun covering all sharp edges.  She has polymyalgia rheumatica and was treated with prednisone , gaining weight through this time  She goes to bed around 11.15 Pm, after watching TV in her living room. Falls asleep promptly , and wakes at 12.15 hearing a noise , that seemed to arise form behind her headboard- the noise goes away once she is awake. She could fall asleep again. Her acting out of dreams arises at 4 AM or later in the last stages of sleep and gets more frequently over the last year.  She rises at 8.30, the alarm wakes her at 8.  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 No childhood sleep disorder history. Usually a 7 -8 hour sleeper , refreshed.  The patient is widowed for 12 years, has adult children and grandchildren. The bruit  in her left temple keeps her form sleeping on the side.  She has 3 carotid dopplers over 25 years, diagnosed with  tortious vessel.  MRI brain was normal 4 year ago ( Dr. Carloyn Manner)  No MRA was done at the time , I like to rule out a vascular abnormality at the frontal lob  Review of Systems: Out of a complete 14 system review, the patient complains of only the following symptoms, and all other reviewed systems are negative. Acting out dreams, yelling but not leaving the bed.  History   Social History  . Marital Status: Widowed    Spouse Name: N/A  . Number of Children: 2  . Years of Education: HS   Occupational History  . RETIRED [employer]   Social History Main Topics  . Smoking status: Never Smoker   . Smokeless tobacco: Never Used  . Alcohol Use: Yes     Comment: rarely  . Drug Use: No  . Sexual Activity: Not on file   Other Topics Concern  . Not on file   Social History Narrative   Patient is widowed.   Patient has two children.   Patient does not drink any caffeine.    Patient has a high school education.   Patient is right-handed.             Family History  Problem Relation Age of Onset  . CVA Father   . Tuberculosis Mother   . Lung cancer Brother     Past Medical History  Diagnosis Date  . Hypertension   . GERD (gastroesophageal reflux disease)   . Arthritis   . Hypothyroidism   . Osteoporosis   . Depression   . Polymyalgia     Past Surgical History  Procedure Laterality Date  . Shoulder arthroscopy  2007    rt   . Nasal sinus surgery    . Tonsillectomy    . Colonoscopy w/ polypectomy    . Eye surgery      both cataracts  . Trigger finger release  03/19/2011    Procedure: RELEASE TRIGGER FINGER/A-1 PULLEY;  Surgeon: Cammie Sickle., MD;  Location: [place];  Service: Orthopedics;  Laterality: Right;  Procedure:  Release Right Long and Ring Trigger Fingers, Release Left Long Trigger Finger, Injection Left Long Proximal  Phalangeal Joint    Current Outpatient Prescriptions  Medication Sig Dispense Refill  . aspirin 81 MG tablet Take 81 mg by mouth daily.     . B Complex-C-Zn-Folic Acid (VITALINE BIOTIN FORTE/ZINC PO) Take 1 tablet by mouth daily.    . benazepril (LOTENSIN) 20 MG tablet Take 20 mg by mouth daily.    . calcium carbonate (OS-CAL) 600 MG TABS tablet Take 600 mg by mouth 2 (two) times daily with a  meal.    . carvedilol (COREG) 3.125 MG tablet 2 tablets 2 (two) times daily with a meal.     . Cholecalciferol (VITAMIN D PO) Take 1 tablet by mouth daily. 500 mg daily    . clonazePAM (KLONOPIN) 0.5 MG tablet Take 0.5 tablets (0.25 mg total) by mouth at bedtime. 90 tablet 1  . denosumab (PROLIA) 60 MG/ML SOLN injection Inject 60 mg into the skin every 6 (six) months. Administer in upper arm, thigh, or abdomen    . Docusate Calcium (STOOL SOFTENER PO) Take 1 tablet by mouth daily. 200 mg    . fish oil-omega-3 fatty acids 1000 MG capsule Take 2 g by mouth daily.    . Garlic 1610 MG TBEC Take 1 tablet by mouth daily.    . hydroxychloroquine (PLAQUENIL) 200 MG tablet 1 tablet daily.    . Ibuprofen-Diphenhydramine Cit (ADVIL PM PO) Take by mouth. As needed.    . Iron-Vitamins (GERITOL PO) Take by mouth.    . levothyroxine (SYNTHROID, LEVOTHROID) 50 MCG tablet Take 50 mcg by mouth daily.    . Linaclotide (LINZESS) 145 MCG CAPS capsule Take 145 mcg by mouth. Once a week as needed.    . Mouthwashes (BIOTENE DRY MOUTH MT) Use as directed in the mouth or throat.    Marland Kitchen omeprazole (PRILOSEC) 20 MG capsule Take 20 mg by mouth daily.    . Probiotic Product (PROBIOTIC DAILY PO) Take 1 tablet by mouth daily.     No current facility-administered medications for this visit.    Allergies as of 08/12/2014  . (No Known Allergies)    Vitals: BP 118/64 mmHg  Pulse 76  Resp 18  Ht 5' 3.39" (1.61 m)  Wt 124 lb (56.246 kg)  BMI 21.70 kg/m2 Last Weight:  Wt Readings from Last 1 Encounters:  08/12/14 124 lb (56.246  kg)   Last Height:   Ht Readings from Last 1 Encounters:  08/12/14 5' 3.39" (1.61 m)    Physical exam:  General: The patient is awake, alert and appears not in acute distress. The patient is well groomed. Head: Normocephalic, atraumatic. Neck is supple. Mallampati 3 , retrognathia , neck circumference: 14 ,25 inches.  Cardiovascular:  Regular rate and rhythm , without  murmurs , has a  left sided  carotid bruit, no distended neck veins. Respiratory: Lungs are clear to auscultation. Skin:  Without evidence of edema, or rash Trunk:  normal posture.  Neurologic exam : The patient is awake and alert, oriented to place and time.  Memory subjective described as intact.  There is a normal attention span & concentration ability. Speech is fluent without dysarthria, dysphonia or aphasia. Mood and affect are appropriate.  Cranial nerves: Pupils are equal and briskly reactive to light. Funduscopic exam without evidence of pallor or edema. Extraocular movements  in vertical and horizontal planes intact and without nystagmus. Visual fields by finger perimetry are intact. Hearing to finger rub intact.  Facial sensation intact to fine touch. Facial motor strength is symmetric and tongue and uvula move midline. Motor exam:  Normal tone , muscle bulk and symmetric normal strength in all extremities. Sensory:  Fine touch, pinprick and vibration were  normal. Coordination: Rapid alternating movements in the fingers/hands is tested and normal.  Finger-to-nose maneuver tested and normal without evidence of ataxia, dysmetria or tremor. Gait and station: Patient walks without assistive device . Deep tendon reflexes: in the upper and lower extremities  2 plus. ,Babinski maneuver response is downgoing.  Assessment:  After physical and neurologic examination, review of laboratory studies, imaging, neurophysiology testing and pre-existing records, assessment is :  1) dizziness ,,m lightheadedness over the  last 12 month, since being on KLONOPIN? She takes her own BP , often this is low. Ask Dr Forde Dandy to look at BP regimen.  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    Plan:  Treatment plan and additional workup :  Stay on Klonopin, low dose 0.25 mg nightly. 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

## 2014-08-13 ENCOUNTER — Ambulatory Visit (HOSPITAL_COMMUNITY)
Admission: RE | Admit: 2014-08-13 | Discharge: 2014-08-13 | Disposition: A | Discharge status: Home / Self Care | Payer: Medicare Other | Source: Ambulatory Visit | Attending: Endocrinology | Admitting: Endocrinology

## 2014-08-13 DIAGNOSIS — M810 Age-related osteoporosis without current pathological fracture: Secondary | ICD-10-CM | POA: Insufficient documentation

## 2014-08-13 MED ORDER — DENOSUMAB 60 MG/ML ~~LOC~~ SOLN
60.0000 mg | Freq: Once | SUBCUTANEOUS | Status: AC
  Administered 2014-08-13: 60 mg via SUBCUTANEOUS
  Filled 2014-08-13: qty 1

## 2014-09-10 ENCOUNTER — Telehealth: Payer: Self-pay | Admitting: Neurology

## 2014-09-10 ENCOUNTER — Other Ambulatory Visit: Payer: Self-pay

## 2014-09-10 DIAGNOSIS — Q282 Arteriovenous malformation of cerebral vessels: Secondary | ICD-10-CM

## 2014-09-10 DIAGNOSIS — F514 Sleep terrors [night terrors]: Secondary | ICD-10-CM

## 2014-09-10 MED ORDER — CLONAZEPAM 0.5 MG PO TABS: 0.2500 mg | ORAL_TABLET | Freq: Every day | ORAL | Status: DC

## 2014-09-10 NOTE — Telephone Encounter (Signed)
Patient called and stated that she is on vacation and left her Rx. clonazePAM (KLONOPIN) 0.5 MG tablet at home. She would like to know if she can get a prescription for enough pills to last her throughout the week sent to [facility] at [facility]  [phone]).

## 2014-09-10 NOTE — Telephone Encounter (Signed)
Dr. Brett Fairy approved the refill on klonopin for 10 tablets. Called the number listed and left a message asking her to call back. (just to tell her we faxed the rx)  Will fax the klonopin to her [facility].

## 2014-11-19 ENCOUNTER — Encounter: Payer: Self-pay | Admitting: Internal Medicine

## 2014-12-06 ENCOUNTER — Encounter: Payer: Self-pay | Admitting: Internal Medicine

## 2015-01-27 ENCOUNTER — Encounter: Payer: Self-pay | Admitting: Internal Medicine

## 2015-02-03 ENCOUNTER — Encounter: Payer: Self-pay | Admitting: Neurology

## 2015-02-03 ENCOUNTER — Ambulatory Visit (INDEPENDENT_AMBULATORY_CARE_PROVIDER_SITE_OTHER): Payer: Medicare Other | Admitting: Neurology

## 2015-02-03 VITALS — BP 122/68 | HR 82 | Resp 20 | Ht 64.0 in | Wt 120.0 lb

## 2015-02-03 DIAGNOSIS — G478 Other sleep disorders: Secondary | ICD-10-CM

## 2015-02-03 DIAGNOSIS — G4750 Parasomnia, unspecified: Secondary | ICD-10-CM | POA: Insufficient documentation

## 2015-02-03 NOTE — Patient Instructions (Signed)
I will ask Dr. Forde Dandy to share the blood work with me.

## 2015-02-03 NOTE — Progress Notes (Signed)
[facility]  Provider:  Larey Seat, M D  Referring Provider: [name], MD Primary Care Physician:  Sheela Stack, MD  Chief Complaint  Patient presents with  . Follow-up    memory, and AVM, rm 66, alone    HPI:  Gloria Anderson is a 81 y.o. female was seen here as a referral  from Dr. Forde Dandy for an evaluation of possible night terrors;   02-03-15 Her night terrors have responded to the medication, and her spells are rare now. She returned form [place] and one event "scared her sister to death "  I have spoken to Dr. Delice Lesch about the possible component of cavernous vascular malformations producing a seizure event. I ordered a 48 hour study but the patient agrees today to a 72 hour study. This was normal- She is doing well on Klonopin. She gained weight on klonopin, a fact she likes. She reports vertigo, dizzines with rapid head movementns and bending down. Her Hypertension may play a role. She is reporting that she measured BP of 100 over 60 mm Hg. She may have a need for reduction.  The spells happen any time of day. Onset over the last 12 month. Her eye feel as if pulsating - can this be the AVM? She fell out of bed on 01-31-15 .  The patient has done well with Klonopin, sleeps well and does not feel depressed. The geriatric depression score was endorsed at 0 points.  In order to gauge possible side effects of long-standing Klonopin therapy be performed today a [place]ral cognitive assessment test. It is 02-03-15 the patient scored 27 out of 30 points. Her balance is another worry. She has exercises at the [facility] and water gymnastic.     No more tinnitus complaint.    09-26-13 The patient's  sleep study from 08-05-13 revealed no apnea, no oxygen desaturation, no irregular heart beats, and only took clusters of limb movements that seem to be related to discomfort with sleeping on the left side. She had no periodic limb movements during REM sleep. There was a  prolonged period of slow-wave sleep at around 2 AM during which nor night terrors sleep walking or sleep talking was ordered. The patient reported  that she had another spell- a feeling as if something brushes up on her scalp, or behind her head.    RV 02-27-14  Discussed EEG results, no epileptiform activity , but frontal sharps noted. These are of unclear significance.  See report. No change in meds.needed  Patient is free to travel to [place] in April.  The patient,  a caucasian right handed female , originally from [place], reports vivid dreams, with fearful frightful content.  Her dreams have let her to act out some of the dream content, she wakes with her arms extended and with palpitations, tachycardia form fright. She kicks and fell out of bed, yelling, calling out . Following  one dream last year,  she injured her face in a fall . She begun covering all sharp edges.  She has polymyalgia rheumatica and was treated with prednisone , gaining weight through this time She goes to bed around 11.15 Pm, after watching TV in her living room. Falls asleep promptly , and wakes at 12.15 hearing a noise , that seemed to arise form behind her headboard- the noise goes away once she is awake. She could fall asleep again. Her acting out of dreams arises at 4 AM or later in the last stages of sleep and gets  more frequently over the last year.  She rises at 8.30, the alarm wakes her at 8.  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 No childhood sleep disorder history. Usually a 7 -8 hour sleeper , refreshed.  The patient is widowed for 12 years, has adult children and grandchildren. The bruit in her left temple keeps her form sleeping on the side.  She has 3 carotid dopplers over 25 years, diagnosed with tortious vessel.   MRI brain was normal 4 year ago ( Dr. Carloyn Manner)  No MRA was done at the time , I like to rule out a vascular abnormality at the frontal lob  Review of Systems: Out of a complete 14 system review, the  patient complains of only the following symptoms, and all other reviewed systems are negative. Acting out dreams, yelling but not leaving the bed- has fallen out of bed. Lost 12-15 pounds after dental surgery .  Social History   Social History  . Marital Status: Widowed    Spouse Name: N/A  . Number of Children: 2  . Years of Education: HS   Occupational History  . RETIRED [employer]   Social History Main Topics  . Smoking status: Never Smoker   . Smokeless tobacco: Never Used  . Alcohol Use: Yes     Comment: rarely  . Drug Use: No  . Sexual Activity: Not on file   Other Topics Concern  . Not on file   Social History Narrative   Patient is widowed.   Patient has two children.   Patient does not drink any caffeine.    Patient has a high school education.   Patient is right-handed.             Family History  Problem Relation Age of Onset  . CVA Father   . Tuberculosis Mother   . Lung cancer Brother     Past Medical History  Diagnosis Date  . Hypertension   . GERD (gastroesophageal reflux disease)   . Arthritis   . Hypothyroidism   . Osteoporosis   . Depression   . Polymyalgia [facility])     Past Surgical History  Procedure Laterality Date  . Shoulder arthroscopy  2007    rt   . Nasal sinus surgery    . Tonsillectomy    . Colonoscopy w/ polypectomy    . Eye surgery      both cataracts  . Trigger finger release  03/19/2011    Procedure: RELEASE TRIGGER FINGER/A-1 PULLEY;  Surgeon: Cammie Sickle., MD;  Location: [facility];  Service: Orthopedics;  Laterality: Right;  Procedure:  Release Right Long and Ring Trigger Fingers, Release Left Long Trigger Finger, Injection Left Long Proximal Phalangeal Joint    Current Outpatient Prescriptions  Medication Sig Dispense Refill  . aspirin 81 MG tablet Take 81 mg by mouth daily.     . B Complex-C-Zn-Folic Acid (VITALINE BIOTIN FORTE/ZINC PO) Take 1 tablet by mouth daily.    Marland Kitchen BIOTIN FORTE PO  Take by mouth.    . calcium carbonate (OS-CAL) 600 MG TABS tablet Take 600 mg by mouth 2 (two) times daily with a meal.    . carvedilol (COREG) 3.125 MG tablet 2 tablets 2 (two) times daily with a meal.     . Cholecalciferol (VITAMIN D PO) Take 1 tablet by mouth daily. 500 mg daily    . clonazePAM (KLONOPIN) 0.5 MG tablet Take 0.5 tablets (0.25 mg total) by mouth at bedtime. 10  tablet 0  . denosumab (PROLIA) 60 MG/ML SOLN injection Inject 60 mg into the skin every 6 (six) months. Administer in upper arm, thigh, or abdomen    . Docusate Calcium (STOOL SOFTENER PO) Take 1 tablet by mouth daily. 200 mg    . fish oil-omega-3 fatty acids 1000 MG capsule Take 2 g by mouth daily.    . Garlic [id number] MG TBEC Take 1 tablet by mouth daily.    . hydroxychloroquine (PLAQUENIL) 200 MG tablet 1 tablet daily.    . Ibuprofen-Diphenhydramine Cit (ADVIL PM PO) Take by mouth. As needed.    . Iron-Vitamins (GERITOL PO) Take by mouth.    . levothyroxine (SYNTHROID, LEVOTHROID) 50 MCG tablet Take 50 mcg by mouth daily.    . Linaclotide (LINZESS) 145 MCG CAPS capsule Take 145 mcg by mouth. Once a week as needed.    . Mouthwashes (BIOTENE DRY MOUTH MT) Use as directed in the mouth or throat.    Marland Kitchen omeprazole (PRILOSEC) 20 MG capsule Take 20 mg by mouth daily.    . Probiotic Product (PROBIOTIC DAILY PO) Take 1 tablet by mouth daily.     No current facility-administered medications for this visit.    Allergies as of 02/03/2015  . (No Known Allergies)    Vitals: BP 122/68 mmHg  Pulse 82  Resp 20  Ht 5\' 4"  (1.626 m)  Wt 120 lb (54.432 kg)  BMI 20.59 kg/m2 Last Weight:  Wt Readings from Last 1 Encounters:  02/03/15 120 lb (54.432 kg)   Last Height:   Ht Readings from Last 1 Encounters:  02/03/15 5\' 4"  (1.626 m)    Physical exam:  General: The patient is awake, alert and appears not in acute distress. The patient is well groomed. Head: Normocephalic, atraumatic. Neck is supple. Mallampati 3 , retrognathia  , neck circumference: 14 ,25 inches.  She underwent dental surgery,abcessed tooth - lost weight due to oral pain.  Cardiovascular:  Regular rate and rhythm , without  murmurs , has a  left sided  carotid bruit, no distended neck veins. Respiratory: Lungs are clear to auscultation. Skin:  Without evidence of edema, or rash Trunk:  normal posture.  Neurologic exam : The patient is awake and alert, oriented to place and time.  Memory subjective described as intact.  There is a normal attention span & concentration ability. Speech is fluent without dysarthria, dysphonia or aphasia. Mood and affect are appropriate.  Cranial nerves: Pupils are equal and briskly reactive to light. Funduscopic exam without evidence of pallor or edema. Extraocular movements  in vertical and horizontal planes intact and without nystagmus. Visual fields by finger perimetry are intact. Hearing to finger rub intact.  Facial sensation intact to fine touch. Facial motor strength is symmetric and tongue and uvula move midline. Motor exam:  Normal tone , muscle bulk and symmetric normal strength in all extremities. Sensory:  Fine touch, pinprick and vibration were  normal. Coordination: Rapid alternating movements in the fingers/hands is tested and normal.  Finger-to-nose maneuver tested and normal without evidence of ataxia, dysmetria or tremor. Gait and station: Patient walks without assistive device. Deep tendon reflexes: in the upper and lower extremities 2 plus ,Babinski maneuver response is downgoing.   Assessment:  After physical and neurologic examination, review of laboratory studies, imaging, neurophysiology testing and pre-existing records, assessment is :  1) dizziness ,,lightheadedness over the last 16 month, She takes her own BP , often this is low. Ask Dr Forde Dandy to look at BP  regimen.  She feels a little groggy on KLONOPIN but it has treated her parasomnias.  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 See Dr. Lars Masson note.    Plan:  Treatment plan and additional workup :  Stay on Klonopin, low dose 0.25 mg nightly. She has no cognitive deficit.  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 Can explain tinnitus and dizziness and sleepiness.Marland Kitchen     Gloria Rybolt, MD   CC Dr [name].

## 2015-02-26 ENCOUNTER — Other Ambulatory Visit: Payer: Self-pay

## 2015-02-26 ENCOUNTER — Encounter: Payer: PRIVATE HEALTH INSURANCE | Admitting: Internal Medicine

## 2015-02-26 MED ORDER — CLONAZEPAM 0.5 MG PO TABS: 0.2500 mg | ORAL_TABLET | Freq: Every day | ORAL | Status: DC

## 2015-03-10 ENCOUNTER — Other Ambulatory Visit: Payer: Self-pay | Admitting: Neurology

## 2015-03-11 ENCOUNTER — Other Ambulatory Visit: Payer: Self-pay

## 2015-03-11 MED ORDER — CLONAZEPAM 0.5 MG PO TABS: 0.2500 mg | ORAL_TABLET | Freq: Every day | ORAL | Status: DC

## 2015-03-25 ENCOUNTER — Ambulatory Visit (AMBULATORY_SURGERY_CENTER): Payer: Self-pay

## 2015-03-25 VITALS — Ht 64.0 in | Wt 120.4 lb

## 2015-03-25 DIAGNOSIS — Z86010 Personal history of colonic polyps: Secondary | ICD-10-CM

## 2015-03-25 MED ORDER — NA SULFATE-K SULFATE-MG SULF 17.5-3.13-1.6 GM/177ML PO SOLN: ORAL | Status: DC

## 2015-03-25 NOTE — Progress Notes (Signed)
Per pt, no allergies to soy or egg products.Pt not taking any weight loss meds or using  O2 at home. 

## 2015-04-08 ENCOUNTER — Ambulatory Visit (AMBULATORY_SURGERY_CENTER): Payer: Medicare Other | Admitting: Internal Medicine

## 2015-04-08 ENCOUNTER — Encounter: Payer: Self-pay | Admitting: Internal Medicine

## 2015-04-08 VITALS — BP 137/62 | HR 66 | Temp 97.5°F | Resp 19 | Ht 64.0 in | Wt 120.0 lb

## 2015-04-08 DIAGNOSIS — Z86010 Personal history of colonic polyps: Secondary | ICD-10-CM | POA: Diagnosis present

## 2015-04-08 MED ORDER — SODIUM CHLORIDE 0.9 % IV SOLN: 500.0000 mL | INTRAVENOUS | Status: DC

## 2015-04-08 NOTE — Patient Instructions (Signed)
YOU HAD AN ENDOSCOPIC PROCEDURE TODAY AT [facility]:   Refer to the procedure report that was given to you for any specific questions about what was found during the examination.  If the procedure report does not answer your questions, please call your gastroenterologist to clarify.  If you requested that your care partner not be given the details of your procedure findings, then the procedure report has been included in a sealed envelope for you to review at your convenience later.  YOU SHOULD EXPECT: Some feelings of bloating in the abdomen. Passage of more gas than usual.  Walking can help get rid of the air that was put into your GI tract during the procedure and reduce the bloating. If you had a lower endoscopy (such as a colonoscopy or flexible sigmoidoscopy) you may notice spotting of blood in your stool or on the toilet paper. If you underwent a bowel prep for your procedure, you may not have a normal bowel movement for a few days.  Please Note:  You might notice some irritation and congestion in your nose or some drainage.  This is from the oxygen used during your procedure.  There is no need for concern and it should clear up in a day or so.  SYMPTOMS TO REPORT IMMEDIATELY:   Following lower endoscopy (colonoscopy or flexible sigmoidoscopy):  Excessive amounts of blood in the stool  Significant tenderness or worsening of abdominal pains  Swelling of the abdomen that is new, acute  Fever of 100F or higher   For urgent or emergent issues, a gastroenterologist can be reached at any hour by calling [phone].   DIET: Your first meal following the procedure should be a small meal and then it is ok to progress to your normal diet. Heavy or fried foods are harder to digest and may make you feel nauseous or bloated.  Likewise, meals heavy in dairy and vegetables can increase bloating.  Drink plenty of fluids but you should avoid alcoholic beverages for 24  hours.  ACTIVITY:  You should plan to take it easy for the rest of today and you should NOT DRIVE or use heavy machinery until tomorrow (because of the sedation medicines used during the test).    FOLLOW UP: Our staff will call the number listed on your records the next business day following your procedure to check on you and address any questions or concerns that you may have regarding the information given to you following your procedure. If we do not reach you, we will leave a message.  However, if you are feeling well and you are not experiencing any problems, there is no need to return our call.  We will assume that you have returned to your regular daily activities without incident.  If any biopsies were taken you will be contacted by phone or by letter within the next 1-3 weeks.  Please call us at [phone] if you have not heard about the biopsies in 3 weeks.    SIGNATURES/CONFIDENTIALITY: You and/or your care partner have signed paperwork which will be entered into your electronic medical record.  These signatures attest to the fact that that the information above on your After Visit Summary has been reviewed and is understood.  Full responsibility of the confidentiality of this discharge information lies with you and/or your care-partner.  Normal screening

## 2015-04-08 NOTE — Progress Notes (Signed)
Patient awakening,vss,report to rn 

## 2015-04-08 NOTE — Op Note (Signed)
[place]  [address] [place], [id number]   COLONOSCOPY PROCEDURE REPORT  PATIENT: Gloria, Anderson  MR#: [id number] BIRTHDATE: [birth year] , 81  yrs. old GENDER: female ENDOSCOPIST: Eustace Quail, MD REFERRED [id number] Program Recall PROCEDURE DATE:  04/08/2015 PROCEDURE:   Colonoscopy, surveillance First Screening Colonoscopy - Avg.  risk and is 50 yrs.  old or older - No.  Prior Negative Screening - Now for repeat screening. N/A  History of Adenoma - Now for follow-up colonoscopy & has been > or = to 3 yrs.  Yes hx of adenoma.  Has been 3 or more years since last colonoscopy.  Polyps removed today? No Recommend repeat exam, <10 yrs? No ASA CLASS:   Class II INDICATIONS:Surveillance due to prior colonic neoplasia and PH Colon Adenoma. Prior examinations 2007 and 2011 with multiple diminutive adenomatous. MEDICATIONS: Monitored anesthesia care and Propofol 150 mg IV  DESCRIPTION OF PROCEDURE:   After the risks benefits and alternatives of the procedure were thoroughly explained, informed consent was obtained.  The digital rectal exam revealed no abnormalities of the rectum.   The LB [id number]  endoscope was introduced through the anus and advanced to the cecum, which was identified by both the appendix and ileocecal valve. No adverse events experienced.   The quality of the prep was excellent. (Suprep was used)  The instrument was then slowly withdrawn as the colon was fully examined. Estimated blood loss is zero unless otherwise noted in this procedure report.      COLON FINDINGS: A normal appearing cecum, ileocecal valve, and appendiceal orifice were identified.  The ascending, transverse, descending, sigmoid colon, and rectum appeared unremarkable. Retroflexed views revealed no abnormalities. The time to cecum = 3.6 Withdrawal time = 10.5   The scope was withdrawn and the procedure completed. COMPLICATIONS: There were no immediate  complications.  ENDOSCOPIC IMPRESSION: Normal colonoscopy  RECOMMENDATIONS: Return to the care of your primary provider.  GI follow up as needed   eSigned:  Eustace Quail, MD 04/08/2015 10:30 AM   cc: The Patient and [name], MD

## 2015-04-09 ENCOUNTER — Telehealth: Payer: Self-pay

## 2015-04-09 NOTE — Telephone Encounter (Signed)
  Follow up Call-  Call back number 04/08/2015  Post procedure Call Back phone  # [phone]  Permission to leave phone message Yes     Patient questions:  Do you have a fever, pain , or abdominal swelling? No. Pain Score  0 *  Have you tolerated food without any problems? Yes.    Have you been able to return to your normal activities? Yes.    Do you have any questions about your discharge instructions: Diet   No. Medications  No. Follow up visit  No.  Do you have questions or concerns about your Care? No.  Actions: * If pain score is 4 or above: No action needed, pain <4.

## 2015-07-29 ENCOUNTER — Other Ambulatory Visit: Payer: Self-pay

## 2015-07-29 MED ORDER — CLONAZEPAM 0.5 MG PO TABS: 0.2500 mg | ORAL_TABLET | Freq: Every day | ORAL | Status: DC

## 2015-07-29 NOTE — Telephone Encounter (Signed)
RX for klonopin faxed to [facility]. Received a receipt of confirmation.

## 2015-08-04 ENCOUNTER — Encounter: Payer: Self-pay | Admitting: Neurology

## 2015-08-04 ENCOUNTER — Ambulatory Visit (INDEPENDENT_AMBULATORY_CARE_PROVIDER_SITE_OTHER): Payer: Medicare Other | Admitting: Neurology

## 2015-08-04 VITALS — BP 128/78 | HR 68 | Resp 20 | Ht 64.0 in | Wt 120.0 lb

## 2015-08-04 DIAGNOSIS — G478 Other sleep disorders: Secondary | ICD-10-CM

## 2015-08-04 DIAGNOSIS — G4750 Parasomnia, unspecified: Secondary | ICD-10-CM | POA: Insufficient documentation

## 2015-08-04 NOTE — Progress Notes (Signed)
[facility]  Provider:  Larey Seat, M D  Referring Provider: [name], MD Primary Care Physician:  Sheela Stack, MD  Chief Complaint  Patient presents with  . Follow-up    feels "hungover" in the morning after klonopin, gait is a little off in the morning, rm 10, alone    HPI:   Gloria Anderson is a 82 y.o. female was seen here as a referral  from Dr. 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   08-04-2015 Her night terrors have responded to the medication, and her spells are rare now. She returned from [place] and had one event which "scared her sister to death "  I have spoken to Dr. Delice Lesch about the possible component of cavernous vascular malformations producing a seizure event. I ordered a 48 hour study but the patient underwent a 72 hour study. This was normal- no seizure activity,  She is doing well on Klonopin. She gained weight on klonopin, (a fact she likes!) She reports vertigo, dizzines with rapid head movementns and bending down, also controlled with Klonopin. . Her eye feel as if pulsating - can this be the AVM? HTN was addressed in the last 2 visits with endocrinology / PCP.  She joked today that her weight has been stabile for so long, she had no excuse for a new wardrobe !  She had no spells for the last 4 month. She needs no refills today.      09-26-13 The patient's  sleep study from 08-05-13 revealed no apnea, no oxygen desaturation, no irregular heart beats, and only took clusters of limb movements that seem to be related to discomfort with sleeping on the left side. She had no periodic limb movements during REM sleep. There was a prolonged period of slow-wave sleep at around 2 AM during which nor night terrors sleep walking or sleep talking was ordered. The patient reported  that she had another spell- a feeling as  if something brushes up on her scalp, or behind her head.  Since the patient has a long-standing bruit that she can hear at night and that bothers her( tinnitus) I have also suggested to do an MRA of the brain , as I am concerned that they're tortuous vessels , which  may be affecting the left frontal lobe perfusion. This could be a trigger for a nocturnal event as described by the patient clinically. Her MRi documented to my surprise exactly that ! An intracerebral  Venous- cavernous malformation. We are now meeting on 09-26-13 to discuss the results and documented the findings. She had more Night terrors the last weeks. The gabapentin gave her diplopia. I suggest now Topiramate, but she reminded me of involuntary weight loss and loss of apetite. I will try a very low dose of KLONOPIN.  The patient should watch her BP and avoid straining , pressure inducing activities.    RV 02-27-14  Discussed EEG results, no epileptiform activity , but frontal sharps noted. These are of unclear significance.  See report. No change in meds.needed  Patient is free to travel to [place] in April.  The patient,  a caucasian right handed female , originally from [place], reports vivid dreams, with fearful frightful content.  Her dreams have let her to act out some of the dream content, she wakes with her arms extended and with palpitations, tachycardia form fright. She kicks and fell out  of bed, yelling, calling out . Following  one dream last year,  she injured her face in a fall . She begun covering all sharp edges.  She has polymyalgia rheumatica and was treated with prednisone , gaining weight through this time She goes to bed around 11.15 Pm, after watching TV in her living room. Falls asleep promptly , and wakes at 12.15 hearing a noise , that seemed to arise form behind her headboard- the noise goes away once she is awake. She could fall asleep again. Her acting out of dreams arises at 4 AM or later in the last stages of  sleep and gets more frequently over the last year.  She rises at 8.30, the alarm wakes her at 8.  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 No childhood sleep disorder history. Usually a 7 -8 hour sleeper , refreshed.  The patient is widowed for 12 years, has adult children and grandchildren.The bruit in her left temple keeps her form sleeping on the side. She has 3 carotid dopplers over 25 years, diagnosed with  tortious vessel.  MRI brain was normal 4 year ago ( Dr. Carloyn Manner)  No MRA was done at the time , I like to rule out a vascular abnormality at the frontal lob  Review of Systems: Out of a complete 14 system review, the patient complains of only the following symptoms, and all other reviewed systems are negative. Acting out dreams, yelling but not leaving the bed.  Social History   Social History  . Marital Status: Widowed    Spouse Name: N/A  . Number of Children: 2  . Years of Education: HS   Occupational History  . RETIRED [employer]   Social History Main Topics  . Smoking status: Never Smoker   . Smokeless tobacco: Never Used  . Alcohol Use: No     Comment: rarely  . Drug Use: No  . Sexual Activity: Not on file   Other Topics Concern  . Not on file   Social History Narrative   Patient is widowed.   Patient has two children.   Patient does not drink any caffeine.    Patient has a high school education.   Patient is right-handed.             Family History  Problem Relation Age of Onset  . CVA Father   . Tuberculosis Mother   . Lung cancer Brother     Past Medical History  Diagnosis Date  . Hypertension   . GERD (gastroesophageal reflux disease)   . Arthritis   . Hypothyroidism   . Osteoporosis   . Depression   . Polymyalgia [facility])     Past Surgical History  Procedure Laterality Date  . Shoulder arthroscopy  2007    rt   . Nasal sinus surgery    . Tonsillectomy    . Colonoscopy w/ polypectomy    . Eye surgery      both cataracts  . Trigger finger release   03/19/2011    Procedure: RELEASE TRIGGER FINGER/A-1 PULLEY;  Surgeon: Cammie Sickle., MD;  Location: [place];  Service: Orthopedics;  Laterality: Right;  Procedure:  Release Right Long and Ring Trigger Fingers, Release Left Long Trigger Finger, Injection Left Long Proximal Phalangeal Joint    Current Outpatient Prescriptions  Medication Sig Dispense Refill  . ALPRAZolam (XANAX) 0.25 MG tablet Take 0.25 mg by mouth as needed for anxiety.    Marland Kitchen aspirin 81 MG tablet  Take 81 mg by mouth daily.     . B Complex-C-Zn-Folic Acid (VITALINE BIOTIN FORTE/ZINC PO) Take 1 tablet by mouth daily. Reported on 03/25/2015    . BIOTIN FORTE PO Take by mouth.    . calcium carbonate (OS-CAL) 600 MG TABS tablet Take 600 mg by mouth 2 (two) times daily with a meal.    . Calcium Citrate (CITRACAL PO) Take 1,200 mg by mouth.    . carvedilol (COREG) 3.125 MG tablet 2 tablets 2 (two) times daily with a meal.     . Cholecalciferol (VITAMIN D PO) Take 1 tablet by mouth daily. Reported on 04/08/2015    . clonazePAM (KLONOPIN) 0.5 MG tablet Take 0.5 tablets (0.25 mg total) by mouth at bedtime. Patient takes one half tablet (0.25mg ) daily at bedtime 45 tablet 0  . Docusate Calcium (STOOL SOFTENER PO) Take 1 tablet by mouth daily. 200 mg    . fish oil-omega-3 fatty acids 1000 MG capsule Take 2 g by mouth daily.    . Garlic [id number] MG TBEC Take 1 tablet by mouth daily.    . hydroxychloroquine (PLAQUENIL) 200 MG tablet 1 tablet daily.    Marland Kitchen ibuprofen (ADVIL,MOTRIN) 200 MG tablet Take 200 mg by mouth as needed.    . Ibuprofen-Diphenhydramine Cit (ADVIL PM PO) Take by mouth. As needed.    . Iron-Vitamins (GERITOL PO) Take by mouth.    . levothyroxine (SYNTHROID, LEVOTHROID) 50 MCG tablet Take 50 mcg by mouth daily.    . Linaclotide (LINZESS) 145 MCG CAPS capsule Take 145 mcg by mouth. Once a week as needed.    . Mouthwashes (BIOTENE DRY MOUTH MT) Use as directed in the mouth or throat.    Marland Kitchen omeprazole (PRILOSEC)  20 MG capsule Take 20 mg by mouth daily.    . Probiotic Product (PROBIOTIC DAILY PO) Take 1 tablet by mouth daily.    Marland Kitchen denosumab (PROLIA) 60 MG/ML SOLN injection Inject 60 mg into the skin every 6 (six) months. Reported on 08/04/2015     No current facility-administered medications for this visit.    Allergies as of 08/04/2015  . (No Known Allergies)    Vitals: BP 128/78 mmHg  Pulse 68  Resp 20  Ht 5\' 4"  (1.626 m)  Wt 120 lb (54.432 kg)  BMI 20.59 kg/m2 Last Weight:  Wt Readings from Last 1 Encounters:  08/04/15 120 lb (54.432 kg)   Last Height:   Ht Readings from Last 1 Encounters:  08/04/15 5\' 4"  (1.626 m)    Physical exam:  General: The patient is awake, alert and appears not in acute distress. The patient is well groomed. Head: Normocephalic, atraumatic. Neck is supple. Mallampati 3 , retrognathia , neck circumference: 14 ,25 inches.  Cardiovascular:  Regular rate and rhythm , without  murmurs , has a  left sided  carotid bruit, no distended neck veins. Respiratory: Lungs are clear to auscultation. Skin:  Without evidence of edema, or rash Trunk:  normal posture.  Neurologic exam : The patient is awake and alert, oriented to place and time.  Memory subjective described as intact.  There is a normal attention span & concentration ability. Speech is fluent without dysarthria, dysphonia or aphasia. Mood and affect are appropriate.  Cranial nerves: Pupils are equal and briskly reactive to light. Funduscopic exam without evidence of pallor or edema. Extraocular movements  in vertical and horizontal planes intact and without nystagmus. Visual fields by finger perimetry are intact. Hearing to finger rub intact.  Facial  sensation intact to fine touch. Facial motor strength is symmetric and tongue and uvula move midline. Motor exam:  Normal tone , muscle bulk and symmetric normal strength in all extremities. Sensory:  Fine touch, pinprick and vibration were   normal. Coordination: Rapid alternating movements in the fingers/hands is tested and normal.  Finger-to-nose maneuver tested and normal without evidence of ataxia, dysmetria or tremor. Gait and station: Patient walks without assistive device . Deep tendon reflexes: in the upper and lower extremities 2 plus, Babinski maneuver response is downgoing.   Assessment:  After physical and neurologic examination, review of laboratory studies, imaging, neurophysiology testing and pre-existing records, assessment is :  1) dizziness, lightheadedness over the last 12 month, since being on KLONOPIN? She feels a little groggy in AM and her balance has been less good.  She takes her own BP , often this is low.  Dr Forde Dandy revisited her BP regimen, and d/c one medication.   2)  Sleep parasomnia, possible nocturnal seizures- could be frontal lobe origin, and with her newly found cavernous angiomata, a seizure was ruled out. She reports vivid dreams but hasn't acted them out.  3) weakness, she feels unable to walk longer distances. Unrelated to being "wobbly", just weakness and feeling heavy. She remains slender , is not short of breath, no dizziness, no headaches.   Plan:  Treatment plan and additional workup :  Stay on Klonopin, low dose 0.25 mg nightly. 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   RV every 6 month alternating NP and Me.

## 2015-11-10 ENCOUNTER — Other Ambulatory Visit: Payer: Self-pay

## 2015-11-10 NOTE — Telephone Encounter (Signed)
Sent to [facility]

## 2015-11-11 MED ORDER — CLONAZEPAM 0.5 MG PO TABS: 0.2500 mg | ORAL_TABLET | Freq: Every day | ORAL | 0 refills | Status: DC

## 2015-11-11 NOTE — Telephone Encounter (Signed)
Sent to [facility]

## 2015-11-11 NOTE — Telephone Encounter (Signed)
RX for klonopin faxed to [facility]. Received a receipt of confirmation.

## 2015-12-28 ENCOUNTER — Encounter: Payer: Self-pay | Admitting: Radiology

## 2016-01-08 ENCOUNTER — Other Ambulatory Visit: Payer: Self-pay

## 2016-01-08 ENCOUNTER — Telehealth: Payer: Self-pay | Admitting: Rheumatology

## 2016-01-08 MED ORDER — CLONAZEPAM 0.5 MG PO TABS: 0.2500 mg | ORAL_TABLET | Freq: Every day | ORAL | 0 refills | Status: DC

## 2016-01-08 NOTE — Telephone Encounter (Signed)
RX for klonopin faxed to [facility]. Received a receipt of confirmation.

## 2016-01-28 ENCOUNTER — Telehealth: Payer: Self-pay | Admitting: Rheumatology

## 2016-01-28 ENCOUNTER — Other Ambulatory Visit: Payer: Self-pay | Admitting: Radiology

## 2016-01-28 MED ORDER — HYDROXYCHLOROQUINE SULFATE 200 MG PO TABS: 200.0000 mg | ORAL_TABLET | Freq: Every day | ORAL | 1 refills | Status: DC

## 2016-01-28 NOTE — Telephone Encounter (Signed)
I do have her records and recent labs from Dr Forde Dandy, thank you. I sent them today to be scanned

## 2016-01-28 NOTE — Telephone Encounter (Signed)
I have called patient to advise, told her they are normal. Labs 01/21/16 WNL

## 2016-01-28 NOTE — Telephone Encounter (Signed)
Patient would like to know if we have received lab work from Dr. Freddy Finner office?

## 2016-02-03 ENCOUNTER — Encounter: Payer: Self-pay | Admitting: Neurology

## 2016-02-03 ENCOUNTER — Ambulatory Visit (INDEPENDENT_AMBULATORY_CARE_PROVIDER_SITE_OTHER): Payer: Medicare Other | Admitting: Neurology

## 2016-02-03 VITALS — BP 102/60 | HR 62 | Resp 14 | Ht 64.0 in | Wt 125.0 lb

## 2016-02-03 DIAGNOSIS — F514 Sleep terrors [night terrors]: Secondary | ICD-10-CM | POA: Diagnosis not present

## 2016-02-03 DIAGNOSIS — M353 Polymyalgia rheumatica: Secondary | ICD-10-CM | POA: Insufficient documentation

## 2016-02-03 DIAGNOSIS — M5136 Other intervertebral disc degeneration, lumbar region: Secondary | ICD-10-CM | POA: Insufficient documentation

## 2016-02-03 DIAGNOSIS — M19049 Primary osteoarthritis, unspecified hand: Secondary | ICD-10-CM | POA: Insufficient documentation

## 2016-02-03 DIAGNOSIS — Z79899 Other long term (current) drug therapy: Secondary | ICD-10-CM | POA: Insufficient documentation

## 2016-02-03 DIAGNOSIS — M810 Age-related osteoporosis without current pathological fracture: Secondary | ICD-10-CM | POA: Insufficient documentation

## 2016-02-03 MED ORDER — CLONAZEPAM 0.5 MG PO TABS: 0.2500 mg | ORAL_TABLET | Freq: Every day | ORAL | 2 refills | Status: DC

## 2016-02-03 NOTE — Progress Notes (Signed)
Office Visit Note  Patient: Gloria Anderson             Date of Birth: 1933-08-22           MRN: [id number]             PCP: Sheela Stack, MD Referring: [name], MD Visit Date: 02/05/2016 Occupation: Retired    Subjective:  Right hip pain   History of Present Illness: Karol Segawa is a 82 y.o. female with history of polymyalgia rheumatica and osteoarthritis and osteoporosis. She states she's been having some discomfort in her bilateral trochanteric area. She has nocturnal pain in the trochanter area. She also has left fourth trigger finger. She's been having some discomfort in her bilateral hands.   Activities of Daily Living:  Patient reports morning stiffness for 0 minute.   Patient Denies nocturnal pain.  Difficulty dressing/grooming: Denies Difficulty climbing stairs: Denies Difficulty getting out of chair: Denies Difficulty using hands for taps, buttons, cutlery, and/or writing: Reports   Review of Systems  Constitutional: Positive for fatigue. Negative for night sweats, weight gain, weight loss and weakness.  HENT: Positive for mouth dryness. Negative for mouth sores, trouble swallowing, trouble swallowing and nose dryness.   Eyes: Positive for dryness. Negative for pain, redness and visual disturbance.  Respiratory: Negative for cough, shortness of breath and difficulty breathing.   Cardiovascular: Negative for chest pain, palpitations, hypertension, irregular heartbeat and swelling in legs/feet.  Gastrointestinal: Positive for constipation. Negative for blood in stool and diarrhea.  Endocrine: Negative for increased urination.  Genitourinary: Negative for vaginal dryness.  Musculoskeletal: Positive for arthralgias and joint pain. Negative for joint swelling, myalgias, muscle weakness, muscle tenderness and myalgias.  Skin: Negative for color change, rash, hair loss, skin tightness, ulcers and sensitivity to sunlight.  Allergic/Immunologic: Negative  for susceptible to infections.  Neurological: Negative for dizziness, memory loss and night sweats.  Hematological: Negative for swollen glands.  Psychiatric/Behavioral: Positive for sleep disturbance. Negative for depressed mood. The patient is not nervous/anxious.        Better with clonazepam    PMFS History:  Patient Active Problem List   Diagnosis Date Noted  . Primary osteoarthritis of both feet 02/04/2016  . Essential hypertension 02/04/2016  . PMR (polymyalgia rheumatica) (HCC) 02/03/2016  . Osteoarthritis, hand 02/03/2016  . Age-related osteoporosis without current pathological fracture 02/03/2016  . DDD (degenerative disc disease), lumbar 02/03/2016  . High risk medication use 02/03/2016  . 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 Cerebral cavernous malformation type 1 ([place]) 12/04/2013  . Night terrors, adult 09/26/2013  . HEMORRHOIDS-EXTERNAL 09/19/2009  . GERD 09/19/2009  . CONSTIPATION 09/19/2009  . DYSPHAGIA 09/19/2009  . PERSONAL HISTORY OF COLONIC POLYPS 09/19/2009    Past Medical History:  Diagnosis Date  . Arthritis   . Depression   . GERD (gastroesophageal reflux disease)   . Hypertension   . Hypothyroidism   . Osteoporosis   . Polymyalgia ([place])     Family History  Problem Relation Age of Onset  . CVA Father   . Tuberculosis Mother   . Lung cancer Brother    Past Surgical History:  Procedure Laterality Date  . COLONOSCOPY W/ POLYPECTOMY    . EYE SURGERY     both cataracts  . NASAL SINUS SURGERY    . SHOULDER ARTHROSCOPY  2007   rt   . TONSILLECTOMY    . TRIGGER FINGER RELEASE  03/19/2011   Procedure: RELEASE TRIGGER FINGER/A-1  PULLEY;  Surgeon: Cammie Sickle., MD;  Location: [place];  Service: Orthopedics;  Laterality: Right;  Procedure:  Release Right Long and Ring Trigger Fingers, Release Left Long Trigger Finger, Injection Left Long Proximal Phalangeal Joint   Social History     Social History Narrative   Patient is widowed.   Patient has two children.   Patient does not drink any caffeine.    Patient has a high school education.   Patient is right-handed.              Objective: Vital Signs: BP 118/60   Pulse 68   Resp 12   Ht 5\' 4"  (1.626 m)   Wt 124 lb (56.2 kg)   BMI 21.28 kg/m    Physical Exam  Constitutional: She is oriented to person, place, and time. She appears well-developed and well-nourished.  HENT:  Head: Normocephalic and atraumatic.  Eyes: Conjunctivae and EOM are normal.  Neck: Normal range of motion.  Cardiovascular: Normal rate, regular rhythm, normal heart sounds and intact distal pulses.   Pulmonary/Chest: Effort normal and breath sounds normal.  Abdominal: Soft. Bowel sounds are normal.  Lymphadenopathy:    She has no cervical adenopathy.  Neurological: She is alert and oriented to person, place, and time.  Skin: Skin is warm and dry. Capillary refill takes less than 2 seconds.  Psychiatric: She has a normal mood and affect. Her behavior is normal.  Nursing note and vitals reviewed.    Musculoskeletal Exam: C-spine, thoracic spine, lumbar spine good range of motion she is some discomfort with range of motion of her lumbar spine. Shoulder joints, elbow joints, wrist joints, MCPs with good range of motion. She has thickening of PIP/DIP joints in her hands. She has left hand fourth finger flexor tendon thickening. Hip joints, knee joints, ankles, MTPs PIPs with good range of motion with no synovitis. She has mild tenderness over her right trochanteric bursa area consistent with trochanteric bursitis.  CDAI Exam: No CDAI exam completed.    Investigation: Findings:  04/2015 DEXA T-score is minus 2.8 01/21/2016 CBC CMP from PCP Dr Forde Dandy  Normal 09/08/15 normal Plaquenil eye exam     Imaging: No results found.  Speciality Comments: No specialty comments available.    Procedures:  Hand/UE Inj Date/Time: 02/05/2016  2:24 PM Performed by: Bo Merino Authorized by: Bo Merino   Consent Given by:  Patient Site marked: the procedure site was marked   Timeout: prior to procedure the correct patient, procedure, and site was verified   Indications:  Therapeutic and pain Condition: trigger finger   Location:  Ring finger Site:  L ring A1 Prep: patient was prepped and draped in usual sterile fashion   Needle Size:  27 G Approach:  Volar Ultrasound Guidance: Yes   Medications:  0.5 mL lidocaine 1 %; 10 mg triamcinolone acetonide 40 MG/ML Aspirate amount (mL):  0 Patient tolerance:  Patient tolerated the procedure well with no immediate complications   Allergies: Patient has no known allergies.   Assessment / Plan:     Visit Diagnoses: PMR (polymyalgia rheumatica): She has been doing well without any increased pain or discomfort no increased muscle weakness.  High risk medication use: She is on Plaquenil her labs have been stable and eye exams have been up-to-date.  Primary osteoarthritis of both hands: She continues to have some stiffness in her hands.  Left fourth trigger finger: Per patient's request after informed consent was obtained the flexor tendon sheath  was injected under ultrasound guidance with cortisone as described above. A finger splint was given and precautions were discussed.  Primary osteoarthritis of both feet: She continues to have some stiffness in her feet.  DDD lumbar spine: She has some chronic lower back pain  Osteoporosis - DEXA October 2017 right T score -2.8. She is on Fosamax and we'll get DEXA again in 2 years earlier calcium and vitamin D and resistive exercises were discussed.  She has multiple other medical problems for which she seen by other physicians which are listed as follows:  Gastroesophageal reflux disease  Night terrors, adult  Cerebral cavernous malformation type 1   Essential hypertension  Anxiety   High risk medication use  Plaquenil 200mg  /day Orders: Orders Placed This Encounter  Procedures  . Hand/Upper Extremity Injection/Arthrocentesis   No orders of the defined types were placed in this encounter.   Face-to-face time spent with patient was 30 minutes. 50% of time was spent in counseling and coordination of care.  Follow-Up Instructions: Return in about 6 months (around 08/05/2016) for Polymyalgia rheumatica.   Bo Merino, MD

## 2016-02-03 NOTE — Progress Notes (Signed)
[facility]  Provider:  Larey Seat, M D  Referring Provider: [name], MD Primary Care Physician:  Sheela Stack, MD  Chief Complaint  Patient presents with  . Follow-up    Rm 11. Patient states that her night terrors have come back.     HPI:   Gloria Anderson is a 82 y.o. female was seen here as a referral  from Dr. 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    09-26-13 The patient's  sleep study from 08-05-13 revealed no apnea, no oxygen desaturation, no irregular heart beats, and only took clusters of limb movements that seem to be related to discomfort with sleeping on the left side. She had no periodic limb movements during REM sleep. There was a prolonged period of slow-wave sleep at around 2 AM during which nor night terrors sleep walking or sleep talking was ordered. The patient reported  that she had another spell- a feeling as if something brushes up on her scalp, or behind her head.  Since the patient has a long-standing bruit that she can hear at night and that bothers her( tinnitus) I have also suggested to do an MRA of the brain , as I am concerned that they're tortuous vessels , which  may be affecting the left frontal lobe perfusion. This could be a trigger for a nocturnal event as described by the patient clinically. Her MRi documented to my surprise exactly that ! An intracerebral  Venous- cavernous malformation. We are now meeting on 09-26-13 to discuss the results and documented the findings. She had more Night terrors the last weeks. The gabapentin gave her diplopia. I suggest now Topiramate, but she reminded me of involuntary weight loss and loss of apetite. I will try a very low dose of KLONOPIN.  The patient should watch her BP and avoid straining , pressure inducing activities.   RV 02-27-14  Discussed EEG results, no  epileptiform activity , but frontal sharps noted. These are of unclear significance.  See report. No change in meds.needed  Patient is free to travel to [place] in April.  The patient,  a caucasian right handed female , originally from [place], reports vivid dreams, with fearful frightful content.  Her dreams have let her to act out some of the dream content, she wakes with her arms extended and with palpitations, tachycardia form fright. She kicks and fell out of bed, yelling, calling out . Following  one dream last year,  she injured her face in a fall . She begun covering all sharp edges.  She has polymyalgia rheumatica and was treated with prednisone , gaining weight through this time She goes to bed around 11.15 Pm, after watching TV in her living room. Falls asleep promptly , and wakes at 12.15 hearing a noise , that seemed to arise form behind her headboard- the noise goes away once she is awake. She could fall asleep again. Her acting out of dreams arises at 4 AM or later in the last stages of sleep and gets more frequently over the last year.  She rises at 8.30, the alarm wakes her at 8.  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 No childhood sleep disorder history. Usually a 7 -8 hour sleeper , refreshed.  The patient is widowed for 12 years, has adult children and grandchildren.The bruit in her left temple keeps her form sleeping on the side. She has  3 carotid dopplers over 25 years, diagnosed with  tortious vessel.  MRI brain was normal 4 year ago ( Dr. Carloyn Manner)  No MRA was done at the time , I like to rule out a vascular abnormality at the frontal lobe.  08-04-2015 Her night terrors have responded to the medication, and her spells are rare now. She returned from [place] and had one event which "scared her sister to death "  I have spoken to Dr. Delice Lesch about the possible component of cavernous vascular malformations producing a seizure event. I ordered a 48 hour study but the patient underwent a 72 hour study.  This was normal- no seizure activity,  She is doing well on Klonopin. She gained weight on klonopin, (a fact she likes!) She reports vertigo, dizzines with rapid head movementns and bending down, also controlled with Klonopin.  Her eye feel as if pulsating - can this be the AVM? HTN was addressed in the last 2 visits with endocrinology / PCP.  She joked today that her weight has been stabile for so long, she had no excuse for a new wardrobe !  She had no spells for the last 4 month. She needs no refills today.  Interval history from 02/03/2016. Gloria Anderson reports today that after being almost free of vivid dreams for several month she has returned having some not to the same intensity and not the same frequency as prior to treatment. Some of them are scary they have the character of the night may or or night terror, and she responds was higher heart rate feeling under stress. Klonopin has been working very well at a very low dose for her by now for over 2 years. I would like to continue using Klonopin with the option that if the medication wears off she could increase the dose. She also does not have insomnia when taking Klonopin. She is not fatigued and she is not excessively daytime sleepy, she also does not report that she is groggy in the mornings. She feels stable well-balanced and not at a risk of falling. I will refill her medication today. I also congratulated her to her 82th birthday, which she celebrates in style this coming Saturday.   Review of Systems: Out of a complete 14 system review, the patient complains of only the following symptoms, and all other reviewed systems are negative. Acting out dreams, yelling but not leaving the bed.  Social History   Social History  . Marital status: Widowed    Spouse name: N/A  . Number of children: 2  . Years of education: HS   Occupational History  . RETIRED [employer]   Social History Main Topics  . Smoking status: Never Smoker   . Smokeless tobacco: Never Used  . Alcohol use No     Comment: rarely  . Drug use: No  . Sexual activity: Not on file   Other Topics Concern  . Not on file   Social History Narrative   Patient is widowed.   Patient has two children.   Patient does not drink any caffeine.    Patient has a high school education.   Patient is right-handed.             Family History  Problem Relation Age of Onset  . CVA Father   . Tuberculosis Mother   . Lung cancer Brother     Past Medical History:  Diagnosis Date  . Arthritis   . Depression   . GERD (gastroesophageal  reflux disease)   . Hypertension   . Hypothyroidism   . Osteoporosis   . Polymyalgia ([place])     Past Surgical History:  Procedure Laterality Date  . COLONOSCOPY W/ POLYPECTOMY    . EYE SURGERY     both cataracts  . NASAL SINUS SURGERY    . SHOULDER ARTHROSCOPY  2007   rt   . TONSILLECTOMY    . TRIGGER FINGER RELEASE  03/19/2011   Procedure: RELEASE TRIGGER FINGER/A-1 PULLEY;  Surgeon: Cammie Sickle., MD;  Location: [facility];  Service: Orthopedics;  Laterality: Right;  Procedure:  Release Right Long and Ring Trigger Fingers, Release Left Long Trigger Finger, Injection Left Long Proximal Phalangeal Joint    Current Outpatient Prescriptions  Medication Sig Dispense Refill  . ALPRAZolam (XANAX) 0.25 MG tablet Take 0.25 mg by mouth as needed for anxiety.    Marland Kitchen amLODipine (NORVASC) 5 MG tablet     . aspirin 81 MG tablet Take 81 mg by mouth daily.     Marland Kitchen atorvastatin (LIPITOR) 10 MG tablet     . BIOTIN FORTE PO Take by mouth.    . calcium carbonate (OS-CAL) 600 MG TABS tablet Take 600 mg by mouth 2 (two) times daily with a meal.    . Calcium Citrate (CITRACAL PO) Take 1,200 mg by mouth.    . carvedilol (COREG) 3.125 MG tablet 2 tablets 2 (two) times daily with a meal.     . clonazePAM (KLONOPIN) 0.5 MG tablet Take 0.5 tablets (0.25 mg total) by mouth at bedtime. Patient takes one half tablet  (0.25mg ) daily at bedtime 45 tablet 0  . Docusate Calcium (STOOL SOFTENER PO) Take 1 tablet by mouth daily. 200 mg    . fish oil-omega-3 fatty acids 1000 MG capsule Take 2 g by mouth daily.    . Garlic [id number] MG TBEC Take 1 tablet by mouth daily.    . hydroxychloroquine (PLAQUENIL) 200 MG tablet Take 1 tablet (200 mg total) by mouth daily. 90 tablet 1  . ibuprofen (ADVIL,MOTRIN) 200 MG tablet Take 200 mg by mouth as needed.    . Iron-Vitamins (GERITOL PO) Take by mouth.    . levothyroxine (SYNTHROID, LEVOTHROID) 50 MCG tablet Take 50 mcg by mouth daily.    . Linaclotide (LINZESS) 145 MCG CAPS capsule Take 145 mcg by mouth. Once a week as needed.    . Mouthwashes (BIOTENE DRY MOUTH MT) Use as directed in the mouth or throat.    Marland Kitchen omeprazole (PRILOSEC) 20 MG capsule Take 20 mg by mouth daily.    . Probiotic Product (PROBIOTIC DAILY PO) Take 1 tablet by mouth daily.    . valsartan (DIOVAN) 160 MG tablet      No current facility-administered medications for this visit.     Allergies as of 02/03/2016  . (No Known Allergies)    Vitals: BP 102/60   Pulse 62   Resp 14   Ht 5\' 4"  (1.626 m)   Wt 125 lb (56.7 kg)   BMI 21.46 kg/m  Last Weight:  Wt Readings from Last 1 Encounters:  02/03/16 125 lb (56.7 kg)   Last Height:   Ht Readings from Last 1 Encounters:  02/03/16 5\' 4"  (1.626 m)    Physical exam:  General: The patient is awake, alert and appears not in acute distress. The patient is well groomed. Head: Normocephalic, atraumatic. Neck is supple. Mallampati 3 , retrognathia , neck circumference: 14 ,25 inches.  Cardiovascular:  Regular  rate and rhythm , without  murmurs , has a  left sided  carotid bruit, no distended neck veins. Respiratory: Lungs are clear to auscultation. Skin:  Without evidence of edema, or rash Trunk:  normal posture.  Neurologic exam : The patient is awake and alert, oriented to place and time.  Memory subjective described as intact.  There is a normal  attention span & concentration ability. Speech is fluent without dysarthria, dysphonia or aphasia. Mood and affect are appropriate.  Cranial nerves: Pupils are equal and briskly reactive to light. Funduscopic exam without evidence of pallor or edema.Extraocular movements  in vertical and horizontal planes intact and without nystagmus. Visual fields by finger perimetry are intact.Hearing to finger rub intact.  Facial sensation intact to fine touch. Facial motor strength is symmetric and tongue and uvula move midline. Motor exam:  Normal tone , muscle bulk and symmetric normal strength in all extremities. Sensory:  Fine touch, and vibration were  normal. Coordination: Rapid alternating movements in the fingers/hands is normal.  Finger-to-nose maneuver tested and normal without evidence of ataxia, dysmetria or tremor. Gait and station: Patient walks without assistive device . Deep tendon reflexes: in the upper extremities 2 plus, brisk but no clonus. Patella 3 plus.    Assessment:  After physical and neurologic examination, review of laboratory studies, imaging, neurophysiology testing and pre-existing records, assessment is : Sleep parasomnia, possible nocturnal seizures- could be frontal lobe origin, and with her newly found cavernous angiomata, a seizure was unlikely  EEG normal. She reports vivid dreams but hasn't acted them out.   Plan:  Treatment plan and additional workup :  Stay on Klonopin, low dose 0.25 mg nightly.no insomnia and much less vivid dreams.  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 We discussed possible seizure activity- sometimes in form of vivid dreams. VCM may be cause of auditory misperceptions - she dreams hearing the telephone ring, for example.  Dr Einar Gip started on Lipitor after renal artery and carotid US revealed plaque. .  Continue with medication, water aerobics and continue to travel.  RV every 6 month alternating NP and me.      Gloria Ficek, MD   Cc dr Forde Dandy , Dr Einar Gip.

## 2016-02-03 NOTE — Telephone Encounter (Signed)
OPENED IN ERROR

## 2016-02-04 DIAGNOSIS — M19071 Primary osteoarthritis, right ankle and foot: Secondary | ICD-10-CM | POA: Insufficient documentation

## 2016-02-04 DIAGNOSIS — I10 Essential (primary) hypertension: Secondary | ICD-10-CM | POA: Insufficient documentation

## 2016-02-04 DIAGNOSIS — M19072 Primary osteoarthritis, left ankle and foot: Secondary | ICD-10-CM

## 2016-02-05 ENCOUNTER — Encounter: Payer: Self-pay | Admitting: Rheumatology

## 2016-02-05 ENCOUNTER — Ambulatory Visit (INDEPENDENT_AMBULATORY_CARE_PROVIDER_SITE_OTHER): Payer: Medicare Other | Admitting: Rheumatology

## 2016-02-05 VITALS — BP 118/60 | HR 68 | Resp 12 | Ht 64.0 in | Wt 124.0 lb

## 2016-02-05 DIAGNOSIS — M353 Polymyalgia rheumatica: Secondary | ICD-10-CM

## 2016-02-05 DIAGNOSIS — M5136 Other intervertebral disc degeneration, lumbar region: Secondary | ICD-10-CM

## 2016-02-05 DIAGNOSIS — I10 Essential (primary) hypertension: Secondary | ICD-10-CM

## 2016-02-05 DIAGNOSIS — F514 Sleep terrors [night terrors]: Secondary | ICD-10-CM

## 2016-02-05 DIAGNOSIS — K219 Gastro-esophageal reflux disease without esophagitis: Secondary | ICD-10-CM

## 2016-02-05 DIAGNOSIS — M810 Age-related osteoporosis without current pathological fracture: Secondary | ICD-10-CM

## 2016-02-05 DIAGNOSIS — M65342 Trigger finger, left ring finger: Secondary | ICD-10-CM

## 2016-02-05 DIAGNOSIS — Q283 Other malformations of cerebral vessels: Secondary | ICD-10-CM

## 2016-02-05 DIAGNOSIS — F419 Anxiety disorder, unspecified: Secondary | ICD-10-CM

## 2016-02-05 DIAGNOSIS — M19041 Primary osteoarthritis, right hand: Secondary | ICD-10-CM

## 2016-02-05 DIAGNOSIS — M19072 Primary osteoarthritis, left ankle and foot: Secondary | ICD-10-CM

## 2016-02-05 DIAGNOSIS — M19042 Primary osteoarthritis, left hand: Secondary | ICD-10-CM

## 2016-02-05 DIAGNOSIS — Z79899 Other long term (current) drug therapy: Secondary | ICD-10-CM

## 2016-02-05 DIAGNOSIS — Q048 Other specified congenital malformations of brain: Secondary | ICD-10-CM

## 2016-02-05 DIAGNOSIS — M19071 Primary osteoarthritis, right ankle and foot: Secondary | ICD-10-CM

## 2016-02-05 MED ORDER — TRIAMCINOLONE ACETONIDE 40 MG/ML IJ SUSP
10.0000 mg | INTRAMUSCULAR | Status: AC | PRN
  Administered 2016-02-05: 10 mg

## 2016-02-05 MED ORDER — LIDOCAINE HCL 1 % IJ SOLN
0.5000 mL | INTRAMUSCULAR | Status: AC | PRN
  Administered 2016-02-05: .5 mL

## 2016-03-01 DIAGNOSIS — J189 Pneumonia, unspecified organism: Secondary | ICD-10-CM

## 2016-03-01 HISTORY — DX: Pneumonia, unspecified organism: J18.9

## 2016-04-26 NOTE — Telephone Encounter (Signed)
ERROR

## 2016-05-19 ENCOUNTER — Other Ambulatory Visit: Payer: Self-pay | Admitting: Rheumatology

## 2016-05-19 NOTE — Telephone Encounter (Signed)
Last Visit: 02/05/16 Next Visit: 08/10/16 Labs: 12/25/15 PLQ Eye Exam: 09/08/15 WNL  Okay to refill PLQ and Fosamax?

## 2016-05-19 NOTE — Telephone Encounter (Signed)
Located at 30 day supply. Please ask patient to come in for lab work.

## 2016-05-26 ENCOUNTER — Telehealth: Payer: Self-pay | Admitting: *Deleted

## 2016-05-26 NOTE — Telephone Encounter (Signed)
CMP/ CBC received from PCP drawn on 05/20/16. Results WNL. Left message to advise patient.

## 2016-06-29 ENCOUNTER — Ambulatory Visit (INDEPENDENT_AMBULATORY_CARE_PROVIDER_SITE_OTHER): Payer: Medicare Other | Admitting: Rheumatology

## 2016-06-29 ENCOUNTER — Encounter: Payer: Self-pay | Admitting: Rheumatology

## 2016-06-29 VITALS — BP 108/40 | HR 74 | Resp 14 | Ht 64.0 in | Wt 124.0 lb

## 2016-06-29 DIAGNOSIS — R42 Dizziness and giddiness: Secondary | ICD-10-CM | POA: Diagnosis not present

## 2016-06-29 DIAGNOSIS — M353 Polymyalgia rheumatica: Secondary | ICD-10-CM

## 2016-06-29 DIAGNOSIS — M791 Myalgia, unspecified site: Secondary | ICD-10-CM

## 2016-06-29 DIAGNOSIS — M654 Radial styloid tenosynovitis [de Quervain]: Secondary | ICD-10-CM

## 2016-06-29 DIAGNOSIS — Z79899 Other long term (current) drug therapy: Secondary | ICD-10-CM | POA: Diagnosis not present

## 2016-06-29 NOTE — Patient Instructions (Addendum)
===================== Note: Patient started having myalgia after starting Lipitor. She will have physical exam in about 3 weeks at Dr. Baldwin Crown office. In the meanwhile, we discussed the option of trying coenzyme Q 10 after getting approval from either Dr. Einar Gip or Dr. Forde Dandy.  =======================  Please call Dr. Einar Gip to adjust blood pressure medication if indicated. Her current symptoms of dizziness, weakness with blood pressures that have a diastolic in the 80D may be contributing to the symptoms that she described. On today's visit at Dr. Christella Noa. [place] office, these were the blood pressure values that we found:  108/40 (BP Location: Left Arm, Patient Position: Sitting, Cuff Size: Normal)    On 06/28/2016: U took her blood pressure at home and you had a reading of 80/40 after going to the gym on left arm.  =======================   Hand Exercises Hand exercises can be helpful to almost anyone. These exercises can strengthen the hands, improve flexibility and movement, and increase blood flow to the hands. These results can make work and daily tasks easier. Hand exercises can be especially helpful for people who have joint pain from arthritis or have nerve damage from overuse (carpal tunnel syndrome). These exercises can also help people who have injured a hand. Most of these hand exercises are fairly gentle stretching routines. You can do them often throughout the day. Still, it is a good idea to ask your health care provider which exercises would be best for you. Warming your hands before exercise may help to reduce stiffness. You can do this with gentle massage or by placing your hands in warm water for 15 minutes. Also, make sure you pay attention to your level of hand pain as you begin an exercise routine. Exercises Knuckle Bend  Repeat this exercise 5-10 times with each hand. 1. Stand or sit with your arm, hand, and all five fingers pointed straight up. Make sure your  wrist is straight. 2. Gently and slowly bend your fingers down and inward until the tips of your fingers are touching the tops of your palm. 3. Hold this position for a few seconds. 4. Extend your fingers out to their original position, all pointing straight up again. Finger Fan  Repeat this exercise 5-10 times with each hand. 1. Hold your arm and hand out in front of you. Keep your wrist straight. 2. Squeeze your hand into a fist. 3. Hold this position for a few seconds. 4. Edison Simon out, or spread apart, your hand and fingers as much as possible, stretching every joint fully. Tabletop  Repeat this exercise 5-10 times with each hand. 1. Stand or sit with your arm, hand, and all five fingers pointed straight up. Make sure your wrist is straight. 2. Gently and slowly bend your fingers at the knuckles where they meet the hand until your hand is making an upside-down L shape. Your fingers should form a tabletop. 3. Hold this position for a few seconds. 4. Extend your fingers out to their original position, all pointing straight up again. Making Os  Repeat this exercise 5-10 times with each hand. 1. Stand or sit with your arm, hand, and all five fingers pointed straight up. Make sure your wrist is straight. 2. Make an O shape by touching your pointer finger to your thumb. Hold for a few seconds. Then open your hand wide. 3. Repeat this motion with each finger on your hand. Table Spread  Repeat this exercise 5-10 times with each hand. 1. Place your hand on a table  with your palm facing down. Make sure your wrist is straight. 2. Spread your fingers out as much as possible. Hold this position for a few seconds. 3. Slide your fingers back together again. Hold for a few seconds. Ball Grip   Repeat this exercise 10-15 times with each hand. 1. Hold a tennis ball or another soft ball in your hand. 2. While slowly increasing pressure, squeeze the ball as hard as possible. 3. Squeeze as hard as you can  for 3-5 seconds. 4. Relax and repeat. Wrist Curls  Repeat this exercise 10-15 times with each hand. 1. Sit in a chair that has armrests. 2. Hold a light weight in your hand, such as a dumbbell that weighs 1-3 pounds (0.5-1.4 kg). Ask your health care provider what weight would be best for you. 3. Rest your hand just over the end of the chair arm with your palm facing up. 4. Gently pivot your wrist up and down while holding the weight. Do not twist your wrist from side to side. Contact a health care provider if:  Your hand pain or discomfort gets much worse when you do an exercise.  Your hand pain or discomfort does not improve within 2 hours after you exercise. If you have any of these problems, stop doing these exercises right away. Do not do them again unless your health care provider says that you can. Get help right away if:  You develop sudden, severe hand pain. If this happens, stop doing these exercises right away. Do not do them again unless your health care provider says that you can. This information is not intended to replace advice given to you by your health care provider. Make sure you discuss any questions you have with your health care provider. Document Released: 01/27/2015 Document Revised: 07/24/2015 Document Reviewed: 08/26/2014 Elsevier Interactive Patient Education  2017 [employer]   ===================   Alfonse Ras Disease Alfonse Ras disease is inflammation of the tendon on the thumb side of the wrist. Tendons are cords of tissue that connect bones to muscles. The tendons in your hand pass through a tunnel, or sheath. A slippery layer of tissue (synovium) lets the tendons move smoothly in the sheath. With de Quervain disease, the sheath swells or thickens, causing friction and pain. The condition is also called de Quervain tendinosis and de Quervain syndrome. It occurs most often in women who are 30-78 years old. What are the causes? The exact cause of de  Quervain disease is not known. It may result from:  Overusing your hands, especially with repetitive motions that involve twisting your hand or using a forceful grip.  Pregnancy.  Rheumatoid disease. What increases the risk? You may have a greater risk for de Quervain disease if you:  Are a middle-aged woman.  Are pregnant.  Have rheumatoid arthritis.  Have diabetes.  Use your hands far more than normal, especially with a tight grip or excessive twisting. What are the signs or symptoms? Pain on the thumb side of your wrist is the main symptom of de Quervain disease. Other signs and symptoms include:  Pain that gets worse when you grasp something or turn your wrist.  Pain that extends up the forearm.  Cysts in the area of the pain.  Swelling of your wrist and hand.  A sensation of snapping in the wrist.  Trouble moving the thumb and wrist. How is this diagnosed? Your health care provider may diagnose de Quervain disease based on your signs and symptoms. A  physical exam will also be done. A simple test Wynn Maudlin test) that involves pulling your thumb and wrist to see if this causes pain can help determine whether you have the condition. Sometimes you may need to have an X-ray. How is this treated? Avoiding any activity that causes pain and swelling is the best treatment. Other options include:  Wearing a splint.  Taking medicine. Anti-inflammatory medicines and corticosteroid injections may reduce inflammation and relieve pain.  Having surgery if other treatments do not work. Follow these instructions at home:  Using ice can be helpful after doing activities that involve the sore wrist. To apply ice to the injured area:  Put ice in a plastic bag.  Place a towel between your skin and the bag.  Leave the ice on for 20 minutes, 2-3 times a day.  Take medicines only as directed by your health care provider.  Wear your splint as directed. This will allow your hand  to rest and heal. Contact a health care provider if:  Your pain medicine does not help.  Your pain gets worse.  You develop new symptoms. This information is not intended to replace advice given to you by your health care provider. Make sure you discuss any questions you have with your health care provider. Document Released: 11/10/2000 Document Revised: 07/24/2015 Document Reviewed: 06/20/2013 Elsevier Interactive Patient Education  2017 [employer].  =====================

## 2016-06-29 NOTE — Progress Notes (Signed)
Office Visit Note  Patient: Gloria Anderson             Date of Birth: 02/25/1934           MRN: [id number]             PCP: Sheela Stack, MD Referring: [name], MD Visit Date: 06/29/2016 Occupation: '@GUAROCC' @    Subjective:  Back Pain (Low back pain x 3 weeks, no injury) and Wrist Pain (Left wrist pain x 3 weeks, no injury, worsening, spot)   History of Present Illness: Gloria Anderson is a 83 y.o. female  Last seen 02/05/2016. On that visit, she saw Dr. Estanislado Pandy for PMR and osteoarthritis, and osteoporosis.   Dr. Estanislado Pandy injected her trigger finger with 0.5 and also 1% lidocaine mixed with 10 mg of Kenalog. Site injected: Left fourth trigger finger with ultrasound guidance and placed in a finger splint  Patient has a history of osteoporosis based on bone density done on October 2017 with a finding of T score of -2.8. Patient is on Fosamax and her plan is to repeat her bone density in about November 2019.  For her PMR, she is taking hydroxychloroquine 200 mg once a day. Patient's last labs are from May 24, 2016. CMP with GFR is within normal limits CBC with differential was within normal limits except for low RBCs at 3.2 and low hemoglobin at 11.0. Please see scanned document found immediate time for full details. These labs were done at [facility] and signed by Dr. Roque Cash  Currently, patient is complaining of myalgia. She states it all started after her cardiologist, Dr. Einar Gip, put her on Lipitor. I advised the patient to discuss with PCP, Dr. Forde Dandy, or Dr. Einar Gip on possibly using coenzyme Q10 to minimize her myalgia. Patient is agreeable  Patient also states that there was. Of time a few months ago that her blood pressure ran high. Dr. Einar Gip adjusted her blood pressure medication. Now, her blood pressure is running lower and she is having episodes of weakness, dizziness, disorientation. I've asked the patient to call Dr. Einar Gip to  readjust the medication and discussed with him her current blood pressures area Today, her blood pressure is108/40 (BP Location: Left Arm, Patient Position: Sitting, Cuff Size: Normal)  . Yesterday, after she went to the gym, her blood pressure was 88/43 (taken at home).  She is also complaining of left wrist joint pain consistent with de Quervain's tenosynovitis.  Activities of Daily Living:  Patient reports morning stiffness for 30 minutes.   Patient Denies nocturnal pain.  Difficulty dressing/grooming: Denies Difficulty climbing stairs: Denies Difficulty getting out of chair: Denies Difficulty using hands for taps, buttons, cutlery, and/or writing: Denies   Review of Systems  Constitutional: Negative for fatigue.  HENT: Negative for mouth sores and mouth dryness.   Eyes: Negative for dryness.  Respiratory: Negative for shortness of breath.   Gastrointestinal: Negative for constipation and diarrhea.  Musculoskeletal: Negative for myalgias and myalgias.  Skin: Negative for sensitivity to sunlight.  Psychiatric/Behavioral: Negative for decreased concentration and sleep disturbance.    PMFS History:  Patient Active Problem List   Diagnosis Date Noted  . Primary osteoarthritis of both feet 02/04/2016  . Essential hypertension 02/04/2016  . PMR (polymyalgia rheumatica) (HCC) 02/03/2016  . Osteoarthritis, hand 02/03/2016  . Age-related osteoporosis without current pathological fracture 02/03/2016  . DDD (degenerative disc disease), lumbar 02/03/2016  . High risk medication use 02/03/2016  . 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 Cerebral cavernous malformation type 1 12/04/2013  . Night terrors, adult 09/26/2013  . HEMORRHOIDS-EXTERNAL 09/19/2009  . GERD 09/19/2009  . CONSTIPATION 09/19/2009  . DYSPHAGIA 09/19/2009  . PERSONAL HISTORY OF COLONIC POLYPS 09/19/2009    Past Medical History:  Diagnosis Date  . Arthritis   .  Depression   . GERD (gastroesophageal reflux disease)   . Hypertension   . Hypothyroidism   . Osteoporosis   . Polymyalgia ([place])     Family History  Problem Relation Age of Onset  . CVA Father   . Tuberculosis Mother   . Lung cancer Brother    Past Surgical History:  Procedure Laterality Date  . COLONOSCOPY W/ POLYPECTOMY    . EYE SURGERY     both cataracts  . NASAL SINUS SURGERY    . SHOULDER ARTHROSCOPY  2007   rt   . TONSILLECTOMY    . TRIGGER FINGER RELEASE  03/19/2011   Procedure: RELEASE TRIGGER FINGER/A-1 PULLEY;  Surgeon: Cammie Sickle., MD;  Location: [facility];  Service: Orthopedics;  Laterality: Right;  Procedure:  Release Right Long and Ring Trigger Fingers, Release Left Long Trigger Finger, Injection Left Long Proximal Phalangeal Joint   Social History   Social History Narrative   Patient is widowed.   Patient has two children.   Patient does not drink any caffeine.    Patient has a high school education.   Patient is right-handed.              Objective: Vital Signs: BP (!) 108/40 (BP Location: Left Arm, Patient Position: Sitting, Cuff Size: Normal)   Pulse 74   Resp 14   Ht '5\' 4"'  (1.626 m)   Wt 124 lb (56.2 kg)   BMI 21.28 kg/m    Physical Exam  Constitutional: She is oriented to person, place, and time. She appears well-developed and well-nourished.  HENT:  Head: Normocephalic and atraumatic.  Eyes: EOM are normal. Pupils are equal, round, and reactive to light.  Cardiovascular: Normal rate, regular rhythm and normal heart sounds.  Exam reveals no gallop and no friction rub.   No murmur heard. Pulmonary/Chest: Effort normal and breath sounds normal. She has no wheezes. She has no rales.  Abdominal: Soft. Bowel sounds are normal. She exhibits no distension. There is no tenderness. There is no guarding. No hernia.  Musculoskeletal: Normal range of motion. She exhibits no edema, tenderness or deformity.  Lymphadenopathy:     She has no cervical adenopathy.  Neurological: She is alert and oriented to person, place, and time. Coordination normal.  Skin: Skin is warm and dry. Capillary refill takes less than 2 seconds. No rash noted.  Psychiatric: She has a normal mood and affect. Her behavior is normal.  Nursing note and vitals reviewed.    Musculoskeletal Exam:  Full range of motion of all joints Grip strength is equal and strong bilaterally Fiber myalgia tender points are all absent  CDAI Exam: CDAI Homunculus Exam:   Joint Counts:  CDAI Tender Joint count: 0 CDAI Swollen Joint count: 0  No synovitis on examination.   Investigation: No additional findings.  No visits with results within 6 Month(s) from this visit.  Latest known visit with results is:  Admission on 03/19/2011, Discharged on 03/19/2011  Component Date Value Ref Range Status  . Sodium 03/18/2011 140  135 - 145 mEq/L Final  . Potassium 03/18/2011 4.0  3.5 - 5.1 mEq/L Final  .  Chloride 03/18/2011 104  96 - 112 mEq/L Final  . CO2 03/18/2011 27  19 - 32 mEq/L Final  . Glucose, Bld 03/18/2011 121* 70 - 99 mg/dL Final  . BUN 03/18/2011 23  6 - 23 mg/dL Final  . Creatinine, Ser 03/18/2011 0.80  0.50 - 1.10 mg/dL Final  . Calcium 03/18/2011 9.6  8.4 - 10.5 mg/dL Final  . GFR calc non Af Amer 03/18/2011 70* >90 mL/min Final  . GFR calc Af Amer 03/18/2011 82* >90 mL/min Final   Comment:                                 The eGFR has been calculated                          using the CKD EPI equation.                          This calculation has not been                          validated in all clinical                          situations.                          eGFR's persistently                          <90 mL/min signify                          possible Chronic Kidney Disease.  Marland Kitchen Hemoglobin 03/19/2011 10.0* 12.0 - 15.0 g/dL Final     Imaging: No results found.  Speciality Comments: No specialty comments  available.    Procedures:  No procedures performed Allergies: Patient has no known allergies.   Assessment / Plan:     Visit Diagnoses: PMR (polymyalgia rheumatica) ([place]) - 06/29/2016: No flare of PMR. Adequate response with Plaquenil 200 mg once a day  High risk medication use - 06/29/2016: Plaquenil 200 mg once a day; Plaquenil eye exam normal at Dr. Kellie Moor office January 2018; CBC with differential and CMP with GFR WNL March 2018  De Quervain's tenosynovitis, left - 06/29/2016: Will use Voltaren gel and rest her left wrist joint  Dizzy spells - 06/29/2016: Episodes of low blood pressure associated with dizzy spells.; Will follow with Dr. Einar Gip and adjust blood pressure medication  Myalgia - 06/29/2016: Onset after starting Lipitor   Plan: #1: Please see above for full details on assessment and plan. #2: Patient has been advised to follow with Dr. Einar Gip related to her myalgia probably coming from Lipitor/statins. #3: Patient advised to follow with Dr. Einar Gip regarding periods of dizziness/weakness she may need her blood pressure medicines to be adjusted. In the past her blood pressure was high and so patient's blood pressure medicines were increased. Now the bottoming out and having those symptoms of dizziness and weakness and patient may need her blood pressure medicine readjusted #4: Patient will get physical exam done in 3 weeks at Dr. Baldwin Crown  #5: Return to clinic in 5 months #6: Patient is planning on a trip to [place]  for about 3 weeks with her family   Orders: No orders of the defined types were placed in this encounter.  No orders of the defined types were placed in this encounter.   Face-to-face time spent with patient was 30 minutes. 50% of time was spent in counseling and coordination of care.   Follow-Up Instructions: Return in about 5 months (around 11/29/2016) for PMR//plq 262m//myalgia after crestor//bil SI Jt pain//.   Gloria Willingham, PA-C  Note - This  record has been created using [employer]  Chart creation errors have been sought, but may not always  have been located. Such creation errors do not reflect on  the standard of medical care.

## 2016-07-24 ENCOUNTER — Other Ambulatory Visit: Payer: Self-pay | Admitting: Rheumatology

## 2016-07-27 ENCOUNTER — Other Ambulatory Visit: Payer: Self-pay

## 2016-07-27 DIAGNOSIS — F514 Sleep terrors [night terrors]: Secondary | ICD-10-CM

## 2016-07-27 MED ORDER — CLONAZEPAM 0.5 MG PO TABS: 0.2500 mg | ORAL_TABLET | Freq: Every day | ORAL | 1 refills | Status: DC

## 2016-07-27 NOTE — Telephone Encounter (Signed)
Last Visit: 06/29/16 Next Visit: 11/30/16 Labs: 05/24/16 WNL PLQ Eye exam: 09/08/15 WNL  Okay to refill PLQ and Fosamax?

## 2016-07-27 NOTE — Telephone Encounter (Signed)
RX for klonopin faxed to [facility]. Received a receipt of confirmation.

## 2016-07-28 ENCOUNTER — Telehealth (INDEPENDENT_AMBULATORY_CARE_PROVIDER_SITE_OTHER): Payer: Self-pay | Admitting: *Deleted

## 2016-07-28 NOTE — Telephone Encounter (Signed)
Patient called in this afternoon in regards to having some issues with a couple of her prescriptions. She usually gets her prescriptions through [facility] and she usually gets a 90 day not usually a 30 days? She needs her Fosamax and her Plaquenil if at all possible. Her CB # [phone]. Thank you

## 2016-07-29 NOTE — Telephone Encounter (Signed)
Spoke with patient and she received a 30 day supply of her Fosamax and PLQ. Patient is leaving the country and will need these medications refilled prior to then. Refill for 90 day supply was sent on 07/27/16 according to her chart. Contacted the patient's pharmacy and they state the reason she only received a 30 day supply is because a prescription was sent March 2018 for a 30 supply and the patient was not due for a refill at that time.Advised them that patient is needing the 90 day supply as she will be leaving the country and they are going to get the prescription out to her . Left message to notify patient.

## 2016-08-05 ENCOUNTER — Ambulatory Visit (INDEPENDENT_AMBULATORY_CARE_PROVIDER_SITE_OTHER): Payer: Medicare Other | Admitting: Neurology

## 2016-08-05 ENCOUNTER — Encounter: Payer: Self-pay | Admitting: Neurology

## 2016-08-05 VITALS — BP 113/66 | HR 70 | Ht 64.0 in | Wt 123.0 lb

## 2016-08-05 DIAGNOSIS — G478 Other sleep disorders: Secondary | ICD-10-CM

## 2016-08-05 DIAGNOSIS — D649 Anemia, unspecified: Secondary | ICD-10-CM | POA: Diagnosis not present

## 2016-08-05 DIAGNOSIS — G4752 REM sleep behavior disorder: Secondary | ICD-10-CM

## 2016-08-05 DIAGNOSIS — F514 Sleep terrors [night terrors]: Secondary | ICD-10-CM | POA: Diagnosis not present

## 2016-08-05 DIAGNOSIS — D508 Other iron deficiency anemias: Secondary | ICD-10-CM | POA: Insufficient documentation

## 2016-08-05 MED ORDER — CLONAZEPAM 0.5 MG PO TABS: 0.2500 mg | ORAL_TABLET | Freq: Every day | ORAL | 1 refills | Status: DC

## 2016-08-05 NOTE — Patient Instructions (Signed)
Anemia, Nonspecific Anemia is a condition in which the concentration of red blood cells or hemoglobin in the blood is below normal. Hemoglobin is a substance in red blood cells that carries oxygen to the tissues of the body. Anemia results in not enough oxygen reaching these tissues. What are the causes? Common causes of anemia include:  Excessive bleeding. Bleeding may be internal or external. This includes excessive bleeding from periods (in women) or from the intestine.  Poor nutrition.  Chronic kidney, thyroid, and liver disease.  Bone marrow disorders that decrease red blood cell production.  Cancer and treatments for cancer.  HIV, AIDS, and their treatments.  Spleen problems that increase red blood cell destruction.  Blood disorders.  Excess destruction of red blood cells due to infection, medicines, and autoimmune disorders. What are the signs or symptoms?  Minor weakness.  Dizziness.  Headache.  Palpitations.  Shortness of breath, especially with exercise.  Paleness.  Cold sensitivity.  Indigestion.  Nausea.  Difficulty sleeping.  Difficulty concentrating. Symptoms may occur suddenly or they may develop slowly. How is this diagnosed? Additional blood tests are often needed. These help your health care provider determine the best treatment. Your health care provider will check your stool for blood and look for other causes of blood loss. How is this treated? Treatment varies depending on the cause of the anemia. Treatment can include:  Supplements of iron, vitamin B12, or folic acid.  Hormone medicines.  A blood transfusion. This may be needed if blood loss is severe.  Hospitalization. This may be needed if there is significant continual blood loss.  Dietary changes.  Spleen removal. Follow these instructions at home: Keep all follow-up appointments. It often takes many weeks to correct anemia, and having your health care provider check on your  condition and your response to treatment is very important. Get help right away if:  You develop extreme weakness, shortness of breath, or chest pain.  You become dizzy or have trouble concentrating.  You develop heavy vaginal bleeding.  You develop a rash.  You have bloody or black, tarry stools.  You faint.  You vomit up blood.  You vomit repeatedly.  You have abdominal pain.  You have a fever or persistent symptoms for more than 2-3 days.  You have a fever and your symptoms suddenly get worse.  You are dehydrated. This information is not intended to replace advice given to you by your health care provider. Make sure you discuss any questions you have with your health care provider. Document Released: 03/25/2004 Document Revised: 07/30/2015 Document Reviewed: 08/11/2012 Elsevier Interactive Patient Education  2017 [employer]  

## 2016-08-05 NOTE — Progress Notes (Signed)
[facility]  Provider:  Larey Seat, M D  Referring Provider: [name], MD Primary Care Physician:  [name], MD  Chief Complaint  Patient presents with  . Follow-up    no complaints    HPI:   Gloria Anderson is a 83 y.o. female was seen here as a referral  from Dr. 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 She tolerates Klonipin and is hapy with the REM sleep control.  She has developed higher blood pressures spurs of higher blood pressures that Dr. Forde Dandy and Dr. Einar Gip have followed, and initiated treatment- she developed orthostatic BP!Marland Kitchen She was found to be anemic, sleepy and fatigued. Has normal B 12 and started oral iron.  She has a history of PMR. Polymyalgia rheumatica. She had a normal colonoscopy in 2016. Had lost weight , but recovered .   Gloria Anderson brought her last laboratory results with her, white blood cell count was 3.74K, red blood cell count was 2.9, hemoglobin 9.8 g, hematocrit 28.4%, corpuscular volume 97.1 MCH 33.5. She was over the phone prescribed Nu-iron to take as a supplement. Needs TIBC, Total iron deficiency,   09-26-13 The patient's  sleep study from 08-05-13 revealed no apnea, no oxygen desaturation, no irregular heart beats, and only took clusters of limb movements that seem to be related to discomfort with sleeping on the left side. She had no periodic limb movements during REM sleep. There was a prolonged period of slow-wave sleep at around 2 AM during which nor night terrors sleep walking or sleep talking was ordered. The patient reported  that she had another spell- a feeling as if something brushes up on her scalp, or behind her head.  Since the patient has a long-standing bruit that she can hear at night and that bothers her( tinnitus) I have also suggested to do an MRA of the brain , as I am concerned  that they're tortuous vessels , which  may be affecting the left frontal lobe perfusion. This could be a trigger for a nocturnal event as described by the patient clinically. Her MRi documented to my surprise exactly that ! An intracerebral  Venous- cavernous malformation. We are now meeting on 09-26-13 to discuss the results and documented the findings. She had more Night terrors the last weeks. The gabapentin gave her diplopia. I suggest now Topiramate, but she reminded me of involuntary weight loss and loss of apetite. I will try a very low dose of KLONOPIN.  The patient should watch her BP and avoid straining , pressure inducing activities.   RV 02-27-14  Discussed EEG results, no epileptiform activity , but frontal sharps noted. These are of unclear significance.  See report. No change in meds.needed  Patient is free to travel to [place] in April.  The patient,  a caucasian right handed female , originally from [place], reports vivid dreams, with fearful frightful content.  Her dreams have let her to act out some of the dream content, she wakes with her arms extended and with palpitations, tachycardia form fright. She kicks and fell out of bed, yelling, calling out . Following  one dream last year,  she injured her face in a fall . She begun covering all sharp edges.  She has polymyalgia rheumatica and was treated with prednisone , gaining weight through this time She goes to bed around 11.15 Pm, after watching TV in her living  room. Falls asleep promptly , and wakes at 12.15 hearing a noise , that seemed to arise form behind her headboard- the noise goes away once she is awake. She could fall asleep again. Her acting out of dreams arises at 4 AM or later in the last stages of sleep and gets more frequently over the last year.  She rises at 8.30, the alarm wakes her at 8.  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 No childhood sleep disorder history. Usually a 7 -8 hour sleeper , refreshed.  The patient is widowed  for 12 years, has adult children and grandchildren.The bruit in her left temple keeps her form sleeping on the side. She has 3 carotid dopplers over 25 years, diagnosed with  tortious vessel.  MRI brain was normal 4 year ago ( Dr. Carloyn Manner)  No MRA was done at the time , I like to rule out a vascular abnormality at the frontal lobe.  08-04-2015 Her night terrors have responded to the medication, and her spells are rare now. She returned from [place] and had one event which "scared her sister to death "  I have spoken to Dr. Delice Lesch about the possible component of cavernous vascular malformations producing a seizure event. I ordered a 48 hour study but the patient underwent a 72 hour study. This was normal- no seizure activity,  She is doing well on Klonopin. She gained weight on klonopin, (a fact she likes!) She reports vertigo, dizzines with rapid head movementns and bending down, also controlled with Klonopin.  Her eye feel as if pulsating - can this be the AVM? HTN was addressed in the last 2 visits with endocrinology / PCP.  She joked today that her weight has been stabile for so long, she had no excuse for a new wardrobe !  She had no spells for the last 4 month. She needs no refills today.  Interval history from 02/03/2016. Gloria Anderson reports today that after being almost free of vivid dreams for several month she has returned having some not to the same intensity and not the same frequency as prior to treatment. Some of them are scary they have the character of the night may or or night terror, and she responds was higher heart rate feeling under stress. Klonopin has been working very well at a very low dose for her by now for over 2 years. I would like to continue using Klonopin with the option that if the medication wears off she could increase the dose. She also does not have insomnia when taking Klonopin. She is not fatigued and she is not excessively daytime sleepy, she also does not report  that she is groggy in the mornings. She feels stable well-balanced and not at a risk of falling. I will refill her medication today. I also congratulated her to her 82th birthday, which she celebrates in style this coming Saturday.   Review of Systems: Out of a complete 14 system review, the patient complains of only the following symptoms, and all other reviewed systems are negative. Acting out dreams, yelling but not leaving the bed.  Social History   Social History  . Marital status: Widowed    Spouse name: N/A  . Number of children: 2  . Years of education: HS   Occupational History  . RETIRED [employer]   Social History Main Topics  . Smoking status: Never Smoker  . Smokeless tobacco: Never Used  . Alcohol use No  . Drug use: No  .  Sexual activity: Not on file   Other Topics Concern  . Not on file   Social History Narrative   Patient is widowed.   Patient has two children.   Patient does not drink any caffeine.    Patient has a high school education.   Patient is right-handed.             Family History  Problem Relation Age of Onset  . CVA Father   . Tuberculosis Mother   . Lung cancer Brother     Past Medical History:  Diagnosis Date  . Arthritis   . Depression   . GERD (gastroesophageal reflux disease)   . Hypertension   . Hypothyroidism   . Osteoporosis   . Polymyalgia ([place])     Past Surgical History:  Procedure Laterality Date  . COLONOSCOPY W/ POLYPECTOMY    . EYE SURGERY     both cataracts  . NASAL SINUS SURGERY    . SHOULDER ARTHROSCOPY  2007   rt   . TONSILLECTOMY    . TRIGGER FINGER RELEASE  03/19/2011   Procedure: RELEASE TRIGGER FINGER/A-1 PULLEY;  Surgeon: Cammie Sickle., MD;  Location: [facility];  Service: Orthopedics;  Laterality: Right;  Procedure:  Release Right Long and Ring Trigger Fingers, Release Left Long Trigger Finger, Injection Left Long Proximal Phalangeal Joint    Current Outpatient  Prescriptions  Medication Sig Dispense Refill  . alendronate (FOSAMAX) 70 MG tablet TAKE 1 TABLET BY MOUTH  EVERY WEEK 12 tablet 0  . amLODipine (NORVASC) 5 MG tablet     . aspirin 81 MG tablet Take 81 mg by mouth daily.     Marland Kitchen atorvastatin (LIPITOR) 10 MG tablet     . BIOTIN FORTE PO Take by mouth.    . Calcium Citrate (CITRACAL PO) Take 600 mg by mouth.     . carvedilol (COREG) 3.125 MG tablet 1 tablet every evening.     . clonazePAM (KLONOPIN) 0.5 MG tablet Take 0.5 tablets (0.25 mg total) by mouth at bedtime. Patient takes one half tablet (0.25mg ) daily at bedtime 45 tablet 1  . Docusate Calcium (STOOL SOFTENER PO) Take 1 tablet by mouth daily. 200 mg    . fish oil-omega-3 fatty acids 1000 MG capsule Take 2 g by mouth daily.    . Garlic 8588 MG TBEC Take 1 tablet by mouth daily.    . hydroxychloroquine (PLAQUENIL) 200 MG tablet TAKE 1 TABLET BY MOUTH  DAILY 90 tablet 0  . ibuprofen (ADVIL,MOTRIN) 200 MG tablet Take 200 mg by mouth as needed.    . Iron-Vitamins (GERITOL PO) Take by mouth.    . levothyroxine (SYNTHROID, LEVOTHROID) 50 MCG tablet Take 50 mcg by mouth daily.    . Mouthwashes (BIOTENE DRY MOUTH MT) Use as directed in the mouth or throat.    Marland Kitchen omeprazole (PRILOSEC) 20 MG capsule Take 20 mg by mouth daily.    . Probiotic Product (PROBIOTIC DAILY PO) Take 1 tablet by mouth daily.    . valsartan (DIOVAN) 160 MG tablet      No current facility-administered medications for this visit.     Allergies as of 08/05/2016  . (No Known Allergies)    Vitals: BP 113/66   Pulse 70   Ht 5\' 4"  (1.626 m)   Wt 123 lb (55.8 kg)   BMI 21.11 kg/m  Last Weight:  Wt Readings from Last 1 Encounters:  08/05/16 123 lb (55.8 kg)   Last Height:  Ht Readings from Last 1 Encounters:  08/05/16 5\' 4"  (1.626 m)    Physical exam:  General: The patient is awake, alert and appears not in acute distress. The patient is well groomed. Head: Normocephalic, atraumatic. Neck is supple. Mallampati  3 , retrognathia , neck circumference: 14 ,25 inches.  Cardiovascular:  Regular rate and rhythm , without  murmurs , has a  left sided  carotid bruit, no distended neck veins. Respiratory: Lungs are clear to auscultation. Skin:  Without evidence of edema, or rash Trunk:  normal posture.  Neurologic exam : The patient is awake and alert, oriented to place and time.  Memory subjective described as intact.  There is a normal attention span & concentration ability. Speech is fluent without dysarthria, dysphonia or aphasia. Mood and affect are appropriate.  Cranial nerves: Pupils are equal and briskly reactive to light. Funduscopic exam without evidence of pallor or edema.Extraocular movements  in vertical and horizontal planes intact and without nystagmus. Visual fields by finger perimetry are intact.Hearing to finger rub intact.  Facial sensation intact to fine touch. Facial motor strength is symmetric and tongue and uvula move midline.  Assessment:  After physical and neurologic examination, review of laboratory studies, imaging, neurophysiology testing and pre-existing records, assessment is : Plan:  Treatment plan and additional workup :  Stay on Klonopin, low dose 0.25 mg nightly.no insomnia and much less vivid dreams.  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 We discussed possible seizure activity- sometimes in form of vivid dreams.  Again, treated with Klonopin. Continue with  water aerobics and continue to travel. I wished her a good time in [place].  RV every 6 month alternating NP and me.     Larey Seat, MD   Cc Dr Forde Dandy , Dr Einar Gip.

## 2016-08-06 LAB — IRON AND TIBC
Iron Saturation: 26 % (ref 15–55)
Iron: 78 ug/dL (ref 27–139)
Total Iron Binding Capacity: 303 ug/dL (ref 250–450)
UIBC: 225 ug/dL (ref 118–369)

## 2016-08-06 LAB — FERRITIN: Ferritin: 132 ng/mL (ref 15–150)

## 2016-08-09 ENCOUNTER — Telehealth: Payer: Self-pay | Admitting: Neurology

## 2016-08-09 ENCOUNTER — Telehealth: Payer: Self-pay

## 2016-08-09 NOTE — Telephone Encounter (Signed)
Pt is wanting lab results if they are available. Pt will be available after 3pm today.

## 2016-08-09 NOTE — Telephone Encounter (Signed)
-----   Message from Melvenia Beam, MD sent at 08/09/2016  5:08 PM EDT ----- Labs normal thanks

## 2016-08-09 NOTE — Telephone Encounter (Signed)
See telephone note from 08/09/16

## 2016-08-09 NOTE — Telephone Encounter (Signed)
I called pt. I advised her that Dr. Jaynee Eagles reviewed her labs and found that her results were normal. Pt asked that I mail her a copy of her results. I verified with the pt that the address we have on file is correct. Pt verbalized understanding of results. Pt had no questions at this time but was encouraged to call back if questions arise.

## 2016-08-10 ENCOUNTER — Ambulatory Visit: Payer: Medicare Other | Admitting: Rheumatology

## 2016-08-16 ENCOUNTER — Ambulatory Visit: Payer: Medicare Other | Admitting: Rheumatology

## 2016-08-16 NOTE — Telephone Encounter (Signed)
Pt calling back to inform she did not get the copy in the mail but wants to know if another copy could be printed and placed up front for her to pick up this afternoon

## 2016-08-16 NOTE — Telephone Encounter (Signed)
It could take up to 2 weeks for mail to be delivered through [facility].  I called pt and explained this to her. I will put a copy of her labs up at the front desk for pick up. Pt will need to sign a MR release. Pt verbalized understanding.

## 2016-09-10 ENCOUNTER — Other Ambulatory Visit: Payer: Self-pay | Admitting: Endocrinology

## 2016-09-10 DIAGNOSIS — M5416 Radiculopathy, lumbar region: Secondary | ICD-10-CM

## 2016-09-13 ENCOUNTER — Ambulatory Visit
Admission: RE | Admit: 2016-09-13 | Discharge: 2016-09-13 | Disposition: A | Discharge status: Home / Self Care | Payer: Medicare Other | Source: Ambulatory Visit | Attending: Endocrinology | Admitting: Endocrinology

## 2016-09-13 DIAGNOSIS — M5416 Radiculopathy, lumbar region: Secondary | ICD-10-CM

## 2016-09-13 MED ORDER — IOPAMIDOL (ISOVUE-300) INJECTION 61%
75.0000 mL | Freq: Once | INTRAVENOUS | Status: AC | PRN
Start: 2016-09-13 — End: 2016-09-13
  Administered 2016-09-13: 75 mL via INTRAVENOUS

## 2016-09-20 ENCOUNTER — Other Ambulatory Visit: Payer: Self-pay | Admitting: Rheumatology

## 2016-09-20 NOTE — Telephone Encounter (Signed)
Last Visit: 06/29/16 Next Visit: 11/30/16 Labs: 05/24/16 WNL  Okay to refill per Dr. Estanislado Pandy

## 2016-09-29 ENCOUNTER — Other Ambulatory Visit: Payer: Self-pay | Admitting: Rheumatology

## 2016-09-29 NOTE — Telephone Encounter (Signed)
Last Visit: 06/29/16 Next Visit: 11/30/16 Labs: 05/24/16 WNL PLQ eye exam 09/13/16 WNL  Okay to refill per Dr.Deveshwar

## 2016-11-16 NOTE — Progress Notes (Signed)
Office Visit Note  Patient: Gloria Anderson             Date of Birth: 1933-06-12           MRN: [id number]             PCP: [name], MD Referring: [name], MD Visit Date: 11/30/2016 Occupation: @GUAROCC @    Subjective:  Pain hands, sicca symptoms.   History of Present Illness: Gloria Anderson is a 83 y.o. female with history of PMR and osteoarthritis. She has not had any increased muscle weakness or tenderness since the last visit. She continues to have some discomfort in her hands due to osteoarthritis. She states she's very active. She was also having some discomfort in her bilateral lower extremity and she relates it to her this disease of lumbar spine. She's been having a lot of problems with elevated blood pressure. Her medications were adjusted and now she's having some drop in her blood pressure.  Activities of Daily Living:  Patient reports morning stiffness for 0 minute.   Patient Denies nocturnal pain.  Difficulty dressing/grooming: Denies Difficulty climbing stairs: Reports Difficulty getting out of chair: Denies Difficulty using hands for taps, buttons, cutlery, and/or writing: Reports   Review of Systems  Constitutional: Positive for fatigue. Negative for night sweats, weight gain, weight loss and weakness.  HENT: Positive for mouth dryness. Negative for mouth sores, trouble swallowing, trouble swallowing and nose dryness.   Eyes: Positive for dryness. Negative for pain, redness and visual disturbance.  Respiratory: Negative for cough, shortness of breath and difficulty breathing.   Cardiovascular: Negative.  Negative for chest pain, palpitations, hypertension, irregular heartbeat and swelling in legs/feet.  Gastrointestinal: Positive for constipation. Negative for blood in stool and diarrhea.  Endocrine: Negative for increased urination.  Genitourinary: Negative for vaginal dryness.  Musculoskeletal: Positive for arthralgias and joint pain.  Negative for joint swelling, myalgias, muscle weakness, morning stiffness, muscle tenderness and myalgias.  Skin: Negative.  Negative for color change, rash, hair loss, skin tightness, ulcers and sensitivity to sunlight.  Allergic/Immunologic: Negative for susceptible to infections.  Neurological: Negative for dizziness, numbness, headaches, memory loss and night sweats.  Hematological: Negative for swollen glands.  Psychiatric/Behavioral: Negative.  Negative for depressed mood and sleep disturbance. The patient is not nervous/anxious.     PMFS History:  Patient Active Problem List   Diagnosis Date Noted  . Iron deficiency anemia secondary to inadequate dietary iron intake 08/05/2016  . Fatigue associated with anemia 08/05/2016  . Primary osteoarthritis of both feet 02/04/2016  . Essential hypertension 02/04/2016  . PMR (polymyalgia rheumatica) (HCC) 02/03/2016  . Osteoarthritis, hand 02/03/2016  . Age-related osteoporosis without current pathological fracture 02/03/2016  . DDD (degenerative disc disease), lumbar 02/03/2016  . High risk medication use 02/03/2016  . 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 Cerebral cavernous malformation type 1 12/04/2013  . Night terrors, adult 09/26/2013  . HEMORRHOIDS-EXTERNAL 09/19/2009  . GERD 09/19/2009  . CONSTIPATION 09/19/2009  . DYSPHAGIA 09/19/2009  . PERSONAL HISTORY OF COLONIC POLYPS 09/19/2009    Past Medical History:  Diagnosis Date  . Arthritis   . Depression   . GERD (gastroesophageal reflux disease)   . Hypertension   . Hypothyroidism   . Osteoporosis   . Polymyalgia ([place])     Family History  Problem Relation Age of Onset  . CVA Father   . Tuberculosis Mother   . Lung cancer Brother    Past Surgical  History:  Procedure Laterality Date  . COLONOSCOPY W/ POLYPECTOMY    . EYE SURGERY     both cataracts  . NASAL SINUS SURGERY    . SHOULDER ARTHROSCOPY  2007   rt   .  TONSILLECTOMY    . TRIGGER FINGER RELEASE  03/19/2011   Procedure: RELEASE TRIGGER FINGER/A-1 PULLEY;  Surgeon: Cammie Sickle., MD;  Location: [facility];  Service: Orthopedics;  Laterality: Right;  Procedure:  Release Right Long and Ring Trigger Fingers, Release Left Long Trigger Finger, Injection Left Long Proximal Phalangeal Joint   Social History   Social History Narrative   Patient is widowed.   Patient has two children.   Patient does not drink any caffeine.    Patient has a high school education.   Patient is right-handed.              Objective: Vital Signs: BP (!) 105/46 (BP Location: Left Arm, Patient Position: Sitting, Cuff Size: Normal)   Pulse 67   Ht 5\' 4"  (1.626 m)   Wt 122 lb (55.3 kg)   BMI 20.94 kg/m    Physical Exam  Constitutional: She is oriented to person, place, and time. She appears well-developed and well-nourished.  HENT:  Head: Normocephalic and atraumatic.  Eyes: Conjunctivae and EOM are normal.  Neck: Normal range of motion.  Cardiovascular: Normal rate, regular rhythm, normal heart sounds and intact distal pulses.   Pulmonary/Chest: Effort normal and breath sounds normal.  Abdominal: Soft. Bowel sounds are normal.  Lymphadenopathy:    She has no cervical adenopathy.  Neurological: She is alert and oriented to person, place, and time.  Skin: Skin is warm and dry. Capillary refill takes less than 2 seconds.  Psychiatric: She has a normal mood and affect. Her behavior is normal.  Nursing note and vitals reviewed.    Musculoskeletal Exam: C-spine and thoracic lumbar spine good range of motion. Shoulder joints although joints wrist joint MCPs were good range of motion. She has DIP PIP thickening with incomplete flexion. She also has subluxation of bilateral first PIP joint. She has left fourth trigger finger with flexor tendon thickening. Hip joints knee joints ankles MTPs PIPs DIPs with good range of motion. She has some  tenderness over right piriformis muscle. The muscular weakness or tenderness was noted.  CDAI Exam: No CDAI exam completed.    Investigation: Findings:  05/20/16 CBC shows Hgb 11.0 otherwise normal, CMP normal  Eye exam July 2018 normal  Imaging: No results found.  Speciality Comments: No specialty comments available.    Procedures:  No procedures performed Allergies: Patient has no known allergies.   Assessment / Plan:     Visit Diagnoses: PMR (polymyalgia rheumatica) (HCC) in remission. Patient has no muscle weakness or tenderness.  High risk medication use - Plaquenil 200 mg by mouth daily - Plan: CBC with Differential/Platelet, COMPLETE METABOLIC PANEL WITH GFR we'll check labs today and then every 6 months. According to patient her eye exam has been up to date.  Primary osteoarthritis of both hands: She has severe osteoarthritis with decrease fist formation. She also has subluxation of bilateral first PIP joint. I will refer her to physical therapy for wearing the splints.  Trigger ring finger of left hand: I offered cortisone injection which she declined.  Primary osteoarthritis of both feet: Proper fitting shoes were discussed.  DDD (degenerative disc disease), lumbar: She has chronic lower back pain.  Age-related osteoporosis without current pathological fracture - T-score is  minus 2.8, January 2017/ Fosamax 70 mg by mouth q week - Plan: VITAMIN D 25 Hydroxy (Vit-D Deficiency, Fractures) she is on calcium and vitamin D and also doing some exercises. Plan repeat DEXA in February 2019.  Hypertension: She is getting her medications adjusted and having problems with fluctuating blood pressure.  History of anemia  History of gastroesophageal reflux (GERD)  Night terrors, adult : Followed up by neurology.   Orders: Orders Placed This Encounter  Procedures  . CBC with Differential/Platelet  . COMPLETE METABOLIC PANEL WITH GFR  . VITAMIN D 25 Hydroxy (Vit-D  Deficiency, Fractures)   No orders of the defined types were placed in this encounter.   Face-to-face time spent with patient was 30 minutes. Greater than 50% of time was spent in counseling and coordination of care.  Follow-Up Instructions: Return in about 6 months (around 05/31/2017) for PMR OA DDD.   Bo Merino, MD  Note - This record has been created using Editor, commissioning.  Chart creation errors have been sought, but may not always  have been located. Such creation errors do not reflect on  the standard of medical care.

## 2016-11-22 ENCOUNTER — Other Ambulatory Visit: Payer: Self-pay | Admitting: Rheumatology

## 2016-11-22 NOTE — Telephone Encounter (Addendum)
Last Visit: 06/29/16 Next Visit: 11/30/16 Labs: 05/24/16 WNL  Okay to refill 30 day supply per Dr. Estanislado Pandy

## 2016-11-30 ENCOUNTER — Ambulatory Visit (INDEPENDENT_AMBULATORY_CARE_PROVIDER_SITE_OTHER): Payer: Medicare Other | Admitting: Rheumatology

## 2016-11-30 ENCOUNTER — Encounter: Payer: Self-pay | Admitting: Rheumatology

## 2016-11-30 VITALS — BP 105/46 | HR 67 | Ht 64.0 in | Wt 122.0 lb

## 2016-11-30 DIAGNOSIS — Z862 Personal history of diseases of the blood and blood-forming organs and certain disorders involving the immune mechanism: Secondary | ICD-10-CM | POA: Diagnosis not present

## 2016-11-30 DIAGNOSIS — Z8719 Personal history of other diseases of the digestive system: Secondary | ICD-10-CM

## 2016-11-30 DIAGNOSIS — M19071 Primary osteoarthritis, right ankle and foot: Secondary | ICD-10-CM

## 2016-11-30 DIAGNOSIS — M810 Age-related osteoporosis without current pathological fracture: Secondary | ICD-10-CM

## 2016-11-30 DIAGNOSIS — Z79899 Other long term (current) drug therapy: Secondary | ICD-10-CM | POA: Diagnosis not present

## 2016-11-30 DIAGNOSIS — M65342 Trigger finger, left ring finger: Secondary | ICD-10-CM

## 2016-11-30 DIAGNOSIS — M19041 Primary osteoarthritis, right hand: Secondary | ICD-10-CM

## 2016-11-30 DIAGNOSIS — M19042 Primary osteoarthritis, left hand: Secondary | ICD-10-CM | POA: Diagnosis not present

## 2016-11-30 DIAGNOSIS — M19072 Primary osteoarthritis, left ankle and foot: Secondary | ICD-10-CM

## 2016-11-30 DIAGNOSIS — M5136 Other intervertebral disc degeneration, lumbar region: Secondary | ICD-10-CM | POA: Diagnosis not present

## 2016-11-30 DIAGNOSIS — M353 Polymyalgia rheumatica: Secondary | ICD-10-CM | POA: Diagnosis not present

## 2016-11-30 DIAGNOSIS — F514 Sleep terrors [night terrors]: Secondary | ICD-10-CM | POA: Diagnosis not present

## 2016-11-30 NOTE — Progress Notes (Signed)
Drop in GFR. Please, ask if Pt has been taking any NSAIDS or ABx.

## 2016-12-01 ENCOUNTER — Telehealth: Payer: Self-pay | Admitting: Rheumatology

## 2016-12-01 LAB — CBC WITH DIFFERENTIAL/PLATELET
Basophils Absolute: 29 cells/uL (ref 0–200)
Basophils Relative: 0.6 %
Eosinophils Absolute: 132 cells/uL (ref 15–500)
Eosinophils Relative: 2.7 %
HCT: 31.5 % — ABNORMAL LOW (ref 35.0–45.0)
Hemoglobin: 10.6 g/dL — ABNORMAL LOW (ref 11.7–15.5)
Lymphs Abs: 1024 cells/uL (ref 850–3900)
MCH: 32.1 pg (ref 27.0–33.0)
MCHC: 33.7 g/dL (ref 32.0–36.0)
MCV: 95.5 fL (ref 80.0–100.0)
MPV: 10.6 fL (ref 7.5–12.5)
Monocytes Relative: 7.8 %
Neutro Abs: 3332 cells/uL (ref 1500–7800)
Neutrophils Relative %: 68 %
Platelets: 179 10*3/uL (ref 140–400)
RBC: 3.3 10*6/uL — ABNORMAL LOW (ref 3.80–5.10)
RDW: 12.4 % (ref 11.0–15.0)
Total Lymphocyte: 20.9 %
WBC mixed population: 382 cells/uL (ref 200–950)
WBC: 4.9 10*3/uL (ref 3.8–10.8)

## 2016-12-01 LAB — COMPLETE METABOLIC PANEL WITH GFR
AG Ratio: 1.7 (calc) (ref 1.0–2.5)
ALT: 17 U/L (ref 6–29)
AST: 25 U/L (ref 10–35)
Albumin: 4.2 g/dL (ref 3.6–5.1)
Alkaline phosphatase (APISO): 38 U/L (ref 33–130)
BUN/Creatinine Ratio: 24 (calc) — ABNORMAL HIGH (ref 6–22)
BUN: 28 mg/dL — ABNORMAL HIGH (ref 7–25)
CO2: 27 mmol/L (ref 20–32)
Calcium: 9.1 mg/dL (ref 8.6–10.4)
Chloride: 105 mmol/L (ref 98–110)
Creat: 1.19 mg/dL — ABNORMAL HIGH (ref 0.60–0.88)
GFR, Est African American: 50 mL/min/{1.73_m2} — ABNORMAL LOW (ref >=60)
GFR, Est Non African American: 43 mL/min/{1.73_m2} — ABNORMAL LOW (ref >=60)
Globulin: 2.5 g/dL (calc) (ref 1.9–3.7)
Glucose, Bld: 86 mg/dL (ref 65–99)
Potassium: 4.4 mmol/L (ref 3.5–5.3)
Sodium: 140 mmol/L (ref 135–146)
Total Bilirubin: 0.7 mg/dL (ref 0.2–1.2)
Total Protein: 6.7 g/dL (ref 6.1–8.1)

## 2016-12-01 LAB — VITAMIN D 25 HYDROXY (VIT D DEFICIENCY, FRACTURES): Vit D, 25-Hydroxy: 50 ng/mL (ref 30–100)

## 2016-12-01 NOTE — Telephone Encounter (Signed)
Patient returned Andrea's call about labs. Please call patient back.

## 2016-12-02 ENCOUNTER — Telehealth: Payer: Self-pay | Admitting: Rheumatology

## 2016-12-02 NOTE — Telephone Encounter (Signed)
Ok to sch when possible.

## 2016-12-02 NOTE — Telephone Encounter (Signed)
Patient called in and is requesting a trigger finger injection appointment. Is there somewhere next week we could work patient in? Patient was offered an appointment this afternoon (ok per Amy) but patient could not come today.

## 2016-12-02 NOTE — Telephone Encounter (Signed)
Patient advised of lab results and verbalized understanding. Patient states she is only taking an ASA daily. Patent has not been on ATB recently. Patient has a follow up appointment with PCP at the end of October. Will discuss with him.

## 2016-12-03 NOTE — Telephone Encounter (Signed)
Patient has been scheduled for 12/06/16 at 10:45 am.

## 2016-12-06 ENCOUNTER — Ambulatory Visit (INDEPENDENT_AMBULATORY_CARE_PROVIDER_SITE_OTHER): Payer: Medicare Other | Admitting: Rheumatology

## 2016-12-06 VITALS — BP 125/50 | HR 71

## 2016-12-06 DIAGNOSIS — M65342 Trigger finger, left ring finger: Secondary | ICD-10-CM | POA: Diagnosis not present

## 2016-12-06 MED ORDER — TRIAMCINOLONE ACETONIDE 40 MG/ML IJ SUSP
10.0000 mg | INTRAMUSCULAR | Status: AC | PRN
  Administered 2016-12-06: 10 mg

## 2016-12-06 MED ORDER — LIDOCAINE HCL 1 % IJ SOLN
0.5000 mL | INTRAMUSCULAR | Status: AC | PRN
  Administered 2016-12-06: .5 mL

## 2016-12-06 NOTE — Progress Notes (Signed)
   Procedure Note  Patient: Gloria Anderson             Date of Birth: 05-22-1933           MRN: [id number]             Visit Date: 12/06/2016  Procedures: Visit Diagnoses: Trigger ring finger of left hand Patient came in today to get her left third trigger finger injection. She continues to have pain and discomfort in that finger. Hand/UE Inj Date/Time: 12/06/2016 11:38 AM Performed by: Bo Merino Authorized by: Bo Merino   Consent Given by:  Patient Site marked: the procedure site was marked   Timeout: prior to procedure the correct patient, procedure, and site was verified   Indications:  Therapeutic and tendon swelling Condition: trigger finger   Location:  Ring finger Site:  L ring A1 Prep: patient was prepped and draped in usual sterile fashion   Needle Size:  27 G Approach:  Volar Ultrasound Guidance: Yes   Medications:  0.5 mL lidocaine 1 %; 10 mg triamcinolone acetonide 40 MG/ML Aspirate amount (mL):  0 Patient tolerance:  Patient tolerated the procedure well with no immediate complications    Bo Merino, MD

## 2016-12-24 ENCOUNTER — Telehealth: Payer: Self-pay

## 2016-12-24 NOTE — Telephone Encounter (Signed)
Patient advised the reason she did not receive a 90 supply this last time is because she was due for labs. Patient advised she will receive a 90 day supply with her next refill. Patient verbalized understanding.

## 2016-12-24 NOTE — Telephone Encounter (Signed)
Patient would like for medications that are sent to [facility] to be filled as a 90 day supply.  Stated that she is being charged a co-pay when Rx's are not filled as a 90 day supply.  CB# is [phone].  Please advise.  Thank you.

## 2016-12-27 ENCOUNTER — Other Ambulatory Visit: Payer: Self-pay | Admitting: Rheumatology

## 2016-12-27 NOTE — Telephone Encounter (Signed)
I could not reach the patient. Her GFR is low. I'm not sure if it's due to NSAID use or antibiotic use. Please try to reach patient. Okay to refill Plaquenil.

## 2016-12-27 NOTE — Telephone Encounter (Signed)
Last Visit: 11/30/16 Next Visit: 05/31/17 Labs: 11/30/16 Creat 1.19 GFR 43 Previously normal.  PLQ Eye Exam: 09/13/16 WNL  Okay to refill PLQ?

## 2016-12-27 NOTE — Telephone Encounter (Signed)
Patient was returning Andrea's call. 

## 2016-12-28 NOTE — Telephone Encounter (Signed)
Attempted to contact the patient and left message for patient to call the office.  

## 2017-01-31 ENCOUNTER — Telehealth: Payer: Self-pay | Admitting: Rheumatology

## 2017-01-31 MED ORDER — ALENDRONATE SODIUM 70 MG PO TABS: 70.0000 mg | ORAL_TABLET | ORAL | 0 refills | Status: DC

## 2017-01-31 NOTE — Telephone Encounter (Signed)
Patient called requesting an RX refill on her Fosamax.  She is requesting a 90-supply so that she does not have to pay a copay.  CB#[phone].  Thank you.

## 2017-01-31 NOTE — Telephone Encounter (Signed)
Last Visit: 11/30/16 Next Visit: 05/31/17 Labs: 11/30/16 Creat 1.19 GFR 43 Previously normal.  Okay to refill per Dr. Estanislado Pandy

## 2017-02-07 ENCOUNTER — Ambulatory Visit: Payer: Medicare Other | Admitting: Neurology

## 2017-02-15 ENCOUNTER — Encounter: Payer: Self-pay | Admitting: Neurology

## 2017-02-15 ENCOUNTER — Encounter (INDEPENDENT_AMBULATORY_CARE_PROVIDER_SITE_OTHER): Payer: Self-pay

## 2017-02-15 ENCOUNTER — Ambulatory Visit: Payer: Medicare Other | Admitting: Neurology

## 2017-02-15 VITALS — BP 117/63 | HR 73 | Ht 64.0 in | Wt 123.0 lb

## 2017-02-15 DIAGNOSIS — F514 Sleep terrors [night terrors]: Secondary | ICD-10-CM

## 2017-02-15 MED ORDER — CLONAZEPAM 0.5 MG PO TABS: 0.2500 mg | ORAL_TABLET | Freq: Every day | ORAL | 3 refills | Status: DC

## 2017-02-15 NOTE — Progress Notes (Signed)
[facility]  Provider:  Larey Seat, M D  Referring Provider: [name], MD Primary Care Physician:  [name], MD  Chief Complaint  Patient presents with  . Follow-up    pt alone, rm 11. pt states that things are ok    HPI:   Gloria Anderson is a 83 y.o. female was seen here as a referral  from Dr. 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 She tolerates Klonipin and is hapy with the REM sleep control.  She has developed higher blood pressures spurs of higher blood pressures that Dr. Forde Dandy and Dr. Einar Gip have followed, and initiated treatment- she developed orthostatic BP!Marland Kitchen She was found to be anemic, sleepy and fatigued. Has normal B 12 and started oral iron.  She has a history of PMR. Polymyalgia rheumatica. She had a normal colonoscopy in 2016. Had lost weight , but recovered .   Gloria Anderson brought her last laboratory results with her, white blood cell count was 3.74K, red blood cell count was 2.9, hemoglobin 9.8 g, hematocrit 28.4%, corpuscular volume 97.1 MCH 33.5. She was over the phone prescribed Nu-iron to take as a supplement. Needs TIBC, Total iron deficiency,   09-26-13 The patient's  sleep study from 08-05-13 revealed no apnea, no oxygen desaturation, no irregular heart beats, and only took clusters of limb movements that seem to be related to discomfort with sleeping on the left side. She had no periodic limb movements during REM sleep. There was a prolonged period of slow-wave sleep at around 2 AM during which nor night terrors sleep walking or sleep talking was ordered. The patient reported  that she had another spell- a feeling as if something brushes up on her scalp, or behind her head.  Since the patient has a long-standing bruit that she can hear at night and that bothers her( tinnitus) I have also suggested to do an MRA  of the brain , as I am concerned that they're tortuous vessels , which  may be affecting the left frontal lobe perfusion. This could be a trigger for a nocturnal event as described by the patient clinically. Her MRi documented to my surprise exactly that ! An intracerebral  Venous- cavernous malformation. We are now meeting on 09-26-13 to discuss the results and documented the findings. She had more Night terrors the last weeks. The gabapentin gave her diplopia. I suggest now Topiramate, but she reminded me of involuntary weight loss and loss of apetite. I will try a very low dose of KLONOPIN.  The patient should watch her BP and avoid straining , pressure inducing activities.   RV 02-27-14  Discussed EEG results, no epileptiform activity , but frontal sharps noted. These are of unclear significance.  See report. No change in meds.needed  Patient is free to travel to [place] in April.  The patient,  a caucasian right handed female , originally from [place], reports vivid dreams, with fearful frightful content.  Her dreams have let her to act out some of the dream content, she wakes with her arms extended and with palpitations, tachycardia form fright. She kicks and fell out of bed, yelling, calling out . Following  one dream last year,  she injured her face in a fall . She begun covering all sharp edges.  She has polymyalgia rheumatica and was treated with prednisone , gaining weight through this time She goes to bed around  11.15 Pm, after watching TV in her living room. Falls asleep promptly , and wakes at 12.15 hearing a noise , that seemed to arise form behind her headboard- the noise goes away once she is awake. She could fall asleep again. Her acting out of dreams arises at 4 AM or later in the last stages of sleep and gets more frequently over the last year.  She rises at 8.30, the alarm wakes her at 8.  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 No childhood sleep disorder history. Usually a 7 -8 hour sleeper ,  refreshed.  The patient is widowed for 12 years, has adult children and grandchildren.The bruit in her left temple keeps her form sleeping on the side. She has 3 carotid dopplers over 25 years, diagnosed with  tortious vessel.  MRI brain was normal 4 year ago ( Dr. Carloyn Manner)  No MRA was done at the time , I like to rule out a vascular abnormality at the frontal lobe.  08-04-2015 Her night terrors have responded to the medication, and her spells are rare now. She returned from [place] and had one event which "scared her sister to death "  I have spoken to Dr. Delice Lesch about the possible component of cavernous vascular malformations producing a seizure event. I ordered a 48 hour study but the patient underwent a 72 hour study. This was normal- no seizure activity,  She is doing well on Klonopin. She gained weight on klonopin, (a fact she likes!) She reports vertigo, dizzines with rapid head movementns and bending down, also controlled with Klonopin.  Her eye feel as if pulsating - can this be the AVM? HTN was addressed in the last 2 visits with endocrinology / PCP.  She joked today that her weight has been stabile for so long, she had no excuse for a new wardrobe !  She had no spells for the last 4 month. She needs no refills today.  Interval history from 02/03/2016. Gloria Anderson reports today that after being almost free of vivid dreams for several month she has returned having some not to the same intensity and not the same frequency as prior to treatment. Some of them are scary they have the character of the night may or or night terror, and she responds was higher heart rate feeling under stress. Klonopin has been working very well at a very low dose for her by now for over 2 years. I would like to continue using Klonopin with the option that if the medication wears off she could increase the dose. She also does not have insomnia when taking Klonopin. She is not fatigued and she is not excessively daytime  sleepy, she also does not report that she is groggy in the mornings. She feels stable well-balanced and not at a risk of falling. I will refill her medication today. I also congratulated her to her 82th birthday, which she celebrates in style this coming Saturday.  Travel history from 15 February 2017, [name] has traveled to summer to [place] with some difficulties.  She had no medical problems however.  She brought me her Klonopin prescription but she had not needed to fill and which is now a year old.  I will refill her Klonopin through [facility] today. She has no complain of grogginess or mental cloudiness in the morning. She has night terrors.  She has noted that she is not easily scared , not panicked.    Review of Systems: Out of a complete 14  system review, the patient complains of only the following symptoms, and all other reviewed systems are negative. Acting out dreams, yelling but not leaving the bed.  Social History   Socioeconomic History  . Marital status: Widowed    Spouse name: Not on file  . Number of children: 2  . Years of education: HS  . Highest education level: Not on file  Social Needs  . Financial resource strain: Not on file  . Food insecurity - worry: Not on file  . Food insecurity - inability: Not on file  . Transportation needs - medical: Not on file  . Transportation needs - non-medical: Not on file  Occupational History  . Occupation: RETIRED    Employer: [employer]  Tobacco Use  . Smoking status: Never Smoker  . Smokeless tobacco: Never Used  Substance and Sexual Activity  . Alcohol use: No    Alcohol/week: 0.0 oz  . Drug use: No  . Sexual activity: Not on file  Other Topics Concern  . Not on file  Social History Narrative   Patient is widowed.   Patient has two children.   Patient does not drink any caffeine.    Patient has a high school education.   Patient is right-handed.             Family History  Problem Relation Age of  Onset  . CVA Father   . Tuberculosis Mother   . Lung cancer Brother     Past Medical History:  Diagnosis Date  . Arthritis   . Depression   . GERD (gastroesophageal reflux disease)   . Hypertension   . Hypothyroidism   . Osteoporosis   . Polymyalgia ([place])     Past Surgical History:  Procedure Laterality Date  . COLONOSCOPY W/ POLYPECTOMY    . EYE SURGERY     both cataracts  . NASAL SINUS SURGERY    . SHOULDER ARTHROSCOPY  2007   rt   . TONSILLECTOMY    . TRIGGER FINGER RELEASE  03/19/2011   Procedure: RELEASE TRIGGER FINGER/A-1 PULLEY;  Surgeon: Cammie Sickle., MD;  Location: [facility];  Service: Orthopedics;  Laterality: Right;  Procedure:  Release Right Long and Ring Trigger Fingers, Release Left Long Trigger Finger, Injection Left Long Proximal Phalangeal Joint    Current Outpatient Medications  Medication Sig Dispense Refill  . alendronate (FOSAMAX) 70 MG tablet Take 1 tablet (70 mg total) by mouth once a week. Take with a full glass of water on an empty stomach. 12 tablet 0  . aspirin 81 MG tablet Take 81 mg by mouth daily.     Marland Kitchen BIOTIN FORTE PO Take by mouth.    . Calcium Citrate (CITRACAL PO) Take 600 mg by mouth.     . carvedilol (COREG) 3.125 MG tablet 1 tablet every evening.     . clonazePAM (KLONOPIN) 0.5 MG tablet Take 0.5 tablets (0.25 mg total) by mouth at bedtime. Patient takes one half tablet (0.25mg ) daily at bedtime 45 tablet 1  . Docusate Calcium (STOOL SOFTENER PO) Take 1 tablet by mouth daily. 200 mg    . fish oil-omega-3 fatty acids 1000 MG capsule Take 2 g by mouth daily.    . Garlic 2878 MG TBEC Take 1 tablet by mouth daily.    . hydroxychloroquine (PLAQUENIL) 200 MG tablet TAKE 1 TABLET BY MOUTH  DAILY 90 tablet 0  . Iron-Vitamins (GERITOL PO) Take by mouth.    . levothyroxine (SYNTHROID,  LEVOTHROID) 50 MCG tablet Take 50 mcg by mouth daily.    Marland Kitchen omeprazole (PRILOSEC) 20 MG capsule Take 20 mg by mouth daily.    . Probiotic  Product (PROBIOTIC DAILY PO) Take 1 tablet by mouth daily.    Marland Kitchen atorvastatin (LIPITOR) 10 MG tablet      No current facility-administered medications for this visit.     Allergies as of 02/15/2017  . (No Known Allergies)    Vitals: BP 117/63 (Patient Position: Standing)   Pulse 73   Ht 5\' 4"  (1.626 m)   Wt 123 lb (55.8 kg)   BMI 21.11 kg/m  Last Weight:  Wt Readings from Last 1 Encounters:  02/15/17 123 lb (55.8 kg)   Last Height:   Ht Readings from Last 1 Encounters:  02/15/17 5\' 4"  (1.626 m)    Physical exam:  General: The patient is awake, alert and appears not in acute distress. The patient is well groomed. Head: Normocephalic, atraumatic. Neck is supple. Mallampati 3 , retrognathia , neck circumference: 14 ,25 inches.  Cardiovascular:  Regular rate and rhythm , without  murmurs , has a  left sided  carotid bruit, no distended neck veins. Respiratory: Lungs are clear to auscultation. Skin:  Without evidence of edema, or rash Trunk:  normal posture.  Neurologic exam : Montreal Cognitive Assessment  02/03/2015  Visuospatial/ Executive (0/5) 4  Naming (0/3) 3  Attention: Read list of digits (0/2) 2  Attention: Read list of letters (0/1) 1  Attention: Serial 7 subtraction starting at 100 (0/3) 3  Language: Repeat phrase (0/2) 2  Language : Fluency (0/1) 1  Abstraction (0/2) 2  Delayed Recall (0/5) 3  Orientation (0/6) 6  Total 27  Adjusted Score (based on education) 27     No flowsheet data found.  The patient is awake and alert, oriented to place and time.  Memory subjective described as intact.  There is a normal attention span & concentration ability. Speech is fluent .Mood and affect are appropriate. Cranial nerves: Pupils are equal and briskly reactive to light. Hearing to finger rub intact.   Facial sensation intact to fine touch. Facial motor strength is symmetric and tongue and uvula move midline.  Assessment:  After physical and neurologic  examination, review of laboratory studies, imaging, neurophysiology testing and pre-existing records, assessment is : Plan:  Treatment plan and additional workup : Stay on Klonopin, low dose 0.25 mg nightly. no insomnia and much less vivid dreams.  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 We discussed possible seizure activity- sometimes in form of vivid dreams.  Again, treated with Klonopin- Refilled.  Continue with water aerobics and continue to travel.  Will do MOCA once a year. May refer to dementia trial if positive.    RV every 6 month alternating NP and me- MOCA next time.     Larey Seat, MD   Cc Dr Forde Dandy , Dr Einar Gip.

## 2017-03-17 ENCOUNTER — Other Ambulatory Visit: Payer: Self-pay | Admitting: Rheumatology

## 2017-03-21 ENCOUNTER — Other Ambulatory Visit: Payer: Self-pay | Admitting: Rheumatology

## 2017-03-21 NOTE — Telephone Encounter (Signed)
Last Visit: 11/30/16 Next Visit: 05/31/17 Labs: 11/30/16 Creat 1.19 GFR 43 Previously normal.  PLQ Eye Exam: 09/13/16 WNL  Okay to refill per Dr. Estanislado Pandy

## 2017-03-22 ENCOUNTER — Encounter: Payer: Self-pay | Admitting: Infectious Diseases

## 2017-04-26 ENCOUNTER — Other Ambulatory Visit: Payer: Self-pay | Admitting: Rheumatology

## 2017-04-26 NOTE — Telephone Encounter (Signed)
Last Visit: 11/30/16 Next Visit: 05/31/17 Labs: 11/30/16 Creat 1.19 GFR 43 Previously normal.   Okay to refill per Dr. Estanislado Pandy

## 2017-05-18 NOTE — Progress Notes (Signed)
Office Visit Note  Patient: Gloria Anderson             Date of Birth: June 24, 1933           MRN: [id number]             PCP: [name], MD Referring: [name], MD Visit Date: 05/31/2017 Occupation: @GUAROCC @    Subjective:  New Patient (Initial Visit) (RIGHT SHOULDER PAIN FOR 1 WEEK NO INJURY)   History of Present Illness: Gloria Anderson is a 84 y.o. female with history of polymyalgia rheumatica osteoarthritis and disc disease.  She states she has been having pain and discomfort in her right arm for the last 1 week.  She has had right rotator cuff tear repair in the past.  She does not recall any injury to trigger the pain.  She states she was lifting a heavy can last week after the pain started in her right arm.  She states while she was lifting the skin she suddenly dropped it due to discomfort and it fell on her right ankle which has been hurting.  Not noticed any swelling over her ankle.  She continues to have some discomfort in her hands due to osteoarthritis.  She does have some lower back pain due to underlying degenerative disc disease.  Activities of Daily Living:  Patient reports morning stiffness for 20 minutes.   Patient Reports nocturnal pain.  Difficulty dressing/grooming: Reports Difficulty climbing stairs: Denies Difficulty getting out of chair: Denies Difficulty using hands for taps, buttons, cutlery, and/or writing: Reports   Review of Systems  Constitutional: Negative for fatigue, fever, night sweats, weight gain and weight loss.  HENT: Negative for ear pain, mouth sores, trouble swallowing, trouble swallowing, mouth dryness and nose dryness.   Eyes: Negative for pain, redness, visual disturbance and dryness.  Respiratory: Negative for cough, shortness of breath and difficulty breathing.   Cardiovascular: Negative for chest pain, palpitations, hypertension, irregular heartbeat and swelling in legs/feet.  Gastrointestinal: Positive for  constipation. Negative for blood in stool and diarrhea.  Endocrine: Negative for increased urination.  Genitourinary: Negative for difficulty urinating and vaginal dryness.  Musculoskeletal: Positive for arthralgias, joint pain, myalgias, muscle weakness, morning stiffness and myalgias. Negative for joint swelling and muscle tenderness.  Skin: Negative for color change, rash, hair loss, skin tightness, ulcers and sensitivity to sunlight.  Allergic/Immunologic: Negative for susceptible to infections.  Neurological: Negative for dizziness, light-headedness, numbness, headaches, memory loss, night sweats and weakness.  Hematological: Negative for bruising/bleeding tendency and swollen glands.  Psychiatric/Behavioral: Positive for sleep disturbance. Negative for depressed mood. The patient is not nervous/anxious.     PMFS History:  Patient Active Problem List   Diagnosis Date Noted  . Iron deficiency anemia secondary to inadequate dietary iron intake 08/05/2016  . Fatigue associated with anemia 08/05/2016  . Primary osteoarthritis of both feet 02/04/2016  . Essential hypertension 02/04/2016  . PMR (polymyalgia rheumatica) (HCC) 02/03/2016  . Osteoarthritis, hand 02/03/2016  . Age-related osteoporosis without current pathological fracture 02/03/2016  . DDD (degenerative disc disease), lumbar 02/03/2016  . High risk medication use 02/03/2016  . 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 Cerebral cavernous malformation type 1 12/04/2013  . Night terrors, adult 09/26/2013  . HEMORRHOIDS-EXTERNAL 09/19/2009  . GERD 09/19/2009  . CONSTIPATION 09/19/2009  . DYSPHAGIA 09/19/2009  . PERSONAL HISTORY OF COLONIC POLYPS 09/19/2009    Past Medical History:  Diagnosis Date  . Arthritis   . Depression   .  GERD (gastroesophageal reflux disease)   . Hypertension   . Hypothyroidism   . Osteoporosis   . Polymyalgia ([place])     Family History  Problem  Relation Age of Onset  . CVA Father   . Tuberculosis Mother   . Lung cancer Brother    Past Surgical History:  Procedure Laterality Date  . COLONOSCOPY W/ POLYPECTOMY    . EYE SURGERY     both cataracts  . NASAL SINUS SURGERY    . SHOULDER ARTHROSCOPY  2007   rt   . TONSILLECTOMY    . TRIGGER FINGER RELEASE  03/19/2011   Procedure: RELEASE TRIGGER FINGER/A-1 PULLEY;  Surgeon: Cammie Sickle., MD;  Location: [facility];  Service: Orthopedics;  Laterality: Right;  Procedure:  Release Right Long and Ring Trigger Fingers, Release Left Long Trigger Finger, Injection Left Long Proximal Phalangeal Joint   Social History   Social History Narrative   Patient is widowed.   Patient has two children.   Patient does not drink any caffeine.    Patient has a high school education.   Patient is right-handed.              Objective: Vital Signs: BP (!) 161/77 (BP Location: Left Arm, Patient Position: Sitting, Cuff Size: Normal)   Pulse 71   Resp 16   Ht 5\' 4"  (1.626 m)   Wt 125 lb (56.7 kg)   BMI 21.46 kg/m    Physical Exam  Constitutional: She is oriented to person, place, and time. She appears well-developed and well-nourished.  HENT:  Head: Normocephalic and atraumatic.  Eyes: Conjunctivae and EOM are normal.  Neck: Normal range of motion.  Cardiovascular: Normal rate, regular rhythm, normal heart sounds and intact distal pulses.  Pulmonary/Chest: Effort normal and breath sounds normal.  Abdominal: Soft. Bowel sounds are normal.  Lymphadenopathy:    She has no cervical adenopathy.  Neurological: She is alert and oriented to person, place, and time.  Skin: Skin is warm and dry. Capillary refill takes less than 2 seconds.  Psychiatric: She has a normal mood and affect. Her behavior is normal.  Nursing note and vitals reviewed.    Musculoskeletal Exam: C-spine limited range of motion with stiffness.  Thoracic spine was in good range of motion.  Lumbar spine  had limited range of motion with discomfort.  Shoulder joints were in good range of motion without any discomfort.  Do not have any point tenderness over her right arm or forearm.  No muscular weakness was noted.  She has some DIP PIP thickening in her hands with no synovitis.  Hip joints knee joints ankles MTPs PIPs are in good range of motion with no synovitis.  CDAI Exam: No CDAI exam completed.    Investigation: Findings:  March 22, 2017 LDL 162, HDL 94, CMP GFR 47.7 ,CBC normal, TSH normal, vitamin D 61.4 hemoglobin A1c 5.3, UA negative PLQ eye exam: 09/13/2016 CBC Latest Ref Rng & Units 11/30/2016 03/19/2011  WBC 3.8 - 10.8 Thousand/uL 4.9 -  Hemoglobin 11.7 - 15.5 g/dL 10.6(L) 10.0(L)  Hematocrit 35.0 - 45.0 % 31.5(L) -  Platelets 140 - 400 Thousand/uL 179 -   CMP Latest Ref Rng & Units 11/30/2016 03/18/2011  Glucose 65 - 99 mg/dL 86 121(H)  BUN 7 - 25 mg/dL 28(H) 23  Creatinine 0.60 - 0.88 mg/dL 1.19(H) 0.80  Sodium 135 - 146 mmol/L 140 140  Potassium 3.5 - 5.3 mmol/L 4.4 4.0  Chloride 98 - 110  mmol/L 105 104  CO2 20 - 32 mmol/L 27 27  Calcium 8.6 - 10.4 mg/dL 9.1 9.6  Total Protein 6.1 - 8.1 g/dL 6.7 -  Total Bilirubin 0.2 - 1.2 mg/dL 0.7 -  AST 10 - 35 U/L 25 -  ALT 6 - 29 U/L 17 -    Imaging: No results found.  Speciality Comments: No specialty comments available.    Procedures:  No procedures performed Allergies: Patient has no known allergies.   Assessment / Plan:     Visit Diagnoses: PMR (polymyalgia rheumatica) (HCC) - in remission.  Patient has been on low-dose Plaquenil which is been working very well for her.  She had recurrence of her symptoms coming off Plaquenil in the past.  High risk medication use - Plaquenil 200 mg by mouth daily eye exam: 7/18.  Her labs have been stable.  We will recheck labs in 5 months.  Acute right shoulder pain: Patient did not have any trauma and was having some discomfort in her shoulder and arm but this resolved now.  She  had no tenderness on palpation today.  Acute right ankle pain: She had trauma to her ankle and had some discomfort which is resolved now.  She had no tenderness on palpation.  Primary osteoarthritis of both hands: Joint protection muscle strengthening discussed.  Primary osteoarthritis of both feet: Proper fitting shoes were discussed.  DDD (degenerative disc disease), lumbar: She has chronic discomfort in her back due to underlying disc disease.  Age-related osteoporosis without current pathological fracture - T-score is minus 2.8, January 2017/ Fosamax 70 mg by mouth q week.  She will discuss repeat DEXA with her PCP.  History of gastroesophageal reflux (GERD)  History of hypertension  History of anemia  Night terrors, adult - Followed up by neurology.    Orders: No orders of the defined types were placed in this encounter.  No orders of the defined types were placed in this encounter.   Face-to-face time spent with patient was 30 minutes.  Greater than 50% of time was spent in counseling and coordination of care.  Follow-Up Instructions: Return in about 6 months (around 11/30/2017) for PMR OA DDD, Osteoporosis.   Bo Merino, MD  Note - This record has been created using Editor, commissioning.  Chart creation errors have been sought, but may not always  have been located. Such creation errors do not reflect on  the standard of medical care.

## 2017-05-31 ENCOUNTER — Encounter: Payer: Self-pay | Admitting: Rheumatology

## 2017-05-31 ENCOUNTER — Ambulatory Visit: Payer: Medicare Other | Admitting: Rheumatology

## 2017-05-31 VITALS — BP 161/77 | HR 71 | Resp 16 | Ht 64.0 in | Wt 125.0 lb

## 2017-05-31 DIAGNOSIS — M5136 Other intervertebral disc degeneration, lumbar region: Secondary | ICD-10-CM

## 2017-05-31 DIAGNOSIS — M25511 Pain in right shoulder: Secondary | ICD-10-CM | POA: Diagnosis not present

## 2017-05-31 DIAGNOSIS — M19041 Primary osteoarthritis, right hand: Secondary | ICD-10-CM | POA: Diagnosis not present

## 2017-05-31 DIAGNOSIS — M19042 Primary osteoarthritis, left hand: Secondary | ICD-10-CM

## 2017-05-31 DIAGNOSIS — M810 Age-related osteoporosis without current pathological fracture: Secondary | ICD-10-CM | POA: Diagnosis not present

## 2017-05-31 DIAGNOSIS — M19071 Primary osteoarthritis, right ankle and foot: Secondary | ICD-10-CM

## 2017-05-31 DIAGNOSIS — M25571 Pain in right ankle and joints of right foot: Secondary | ICD-10-CM

## 2017-05-31 DIAGNOSIS — Z8679 Personal history of other diseases of the circulatory system: Secondary | ICD-10-CM | POA: Diagnosis not present

## 2017-05-31 DIAGNOSIS — M353 Polymyalgia rheumatica: Secondary | ICD-10-CM | POA: Diagnosis not present

## 2017-05-31 DIAGNOSIS — Z9889 Other specified postprocedural states: Secondary | ICD-10-CM | POA: Diagnosis not present

## 2017-05-31 DIAGNOSIS — F514 Sleep terrors [night terrors]: Secondary | ICD-10-CM

## 2017-05-31 DIAGNOSIS — Z862 Personal history of diseases of the blood and blood-forming organs and certain disorders involving the immune mechanism: Secondary | ICD-10-CM | POA: Diagnosis not present

## 2017-05-31 DIAGNOSIS — Z8719 Personal history of other diseases of the digestive system: Secondary | ICD-10-CM

## 2017-05-31 DIAGNOSIS — M19072 Primary osteoarthritis, left ankle and foot: Secondary | ICD-10-CM

## 2017-05-31 DIAGNOSIS — Z79899 Other long term (current) drug therapy: Secondary | ICD-10-CM

## 2017-05-31 DIAGNOSIS — G8929 Other chronic pain: Secondary | ICD-10-CM

## 2017-05-31 DIAGNOSIS — M51369 Other intervertebral disc degeneration, lumbar region without mention of lumbar back pain or lower extremity pain: Secondary | ICD-10-CM

## 2017-05-31 NOTE — Patient Instructions (Signed)
Standing Labs We placed an order today for your standing lab work.    Please come back and get your standing labs in may and every 5 months  We have open lab Monday through Friday from 8:30-11:30 AM and 1:30-4:00 PM  at the office of Dr. Bo Merino.   You may experience shorter wait times on Monday and Friday afternoons. The office is located at [address], [place], [place], [place] [address] No appointment is necessary.   Labs are drawn by [facility].  You may receive a bill from [facility] for your lab work. If you have any questions regarding directions or hours of operation,  please call [phone].

## 2017-06-02 DIAGNOSIS — M4800 Spinal stenosis, site unspecified: Secondary | ICD-10-CM | POA: Insufficient documentation

## 2017-06-02 DIAGNOSIS — D649 Anemia, unspecified: Secondary | ICD-10-CM | POA: Insufficient documentation

## 2017-06-02 DIAGNOSIS — M8580 Other specified disorders of bone density and structure, unspecified site: Secondary | ICD-10-CM | POA: Insufficient documentation

## 2017-06-10 ENCOUNTER — Other Ambulatory Visit: Payer: Self-pay | Admitting: Rheumatology

## 2017-06-10 NOTE — Telephone Encounter (Addendum)
Last Visit: 05/31/17 Next Visit: 12/01/17 Labs: 03/02/17 CMP GFR 47.7 ,CBC normal   Okay to refill  per Dr. Estanislado Pandy

## 2017-06-16 ENCOUNTER — Ambulatory Visit: Payer: Medicare Other | Admitting: Neurology

## 2017-07-05 ENCOUNTER — Ambulatory Visit: Payer: Medicare Other | Admitting: Neurology

## 2017-07-15 ENCOUNTER — Other Ambulatory Visit: Payer: Self-pay | Admitting: Rheumatology

## 2017-07-15 NOTE — Telephone Encounter (Signed)
Last Visit: 05/31/17 Next Visit: 12/01/17 Labs: 03/02/17 CMP GFR 47.7 ,CBC normal Plaquenil normal PLQ eye exam 09/13/16   Okay to refill  per Dr. Estanislado Pandy

## 2017-07-26 ENCOUNTER — Encounter: Payer: Self-pay | Admitting: Neurology

## 2017-07-26 ENCOUNTER — Ambulatory Visit: Payer: Medicare Other | Admitting: Neurology

## 2017-07-26 VITALS — BP 135/77 | HR 80 | Ht 64.0 in | Wt 120.0 lb

## 2017-07-26 DIAGNOSIS — R413 Other amnesia: Secondary | ICD-10-CM

## 2017-07-26 DIAGNOSIS — G4759 Other parasomnia: Secondary | ICD-10-CM | POA: Insufficient documentation

## 2017-07-26 DIAGNOSIS — F514 Sleep terrors [night terrors]: Secondary | ICD-10-CM | POA: Diagnosis not present

## 2017-07-26 MED ORDER — CLONAZEPAM 0.5 MG PO TABS: 0.2500 mg | ORAL_TABLET | Freq: Every day | ORAL | 3 refills | Status: DC

## 2017-07-26 NOTE — Progress Notes (Signed)
[facility]  Provider:  Larey Seat, M D  Referring Provider: [name], MD Primary Care Physician:  [name], MD  Chief Complaint  Patient presents with  . Follow-up    pt alone, rm 11. pt states that she wakes up tired in the morning, she has noticed her balance is off. pt states  that she still hear things ex her alarm going off at 3 am but her alarm isnt set to go off at that time. pt states that its not enough to bother her but its still happening.     HPI:        This patient of Dr. 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 She tolerates Klonipin and is hapy with the REM sleep control.  She has developed higher blood pressures spurs of higher blood pressures that Dr. Forde Dandy and Dr. Einar Gip have followed, and initiated treatment- she developed orthostatic BP!Marland Kitchen She was found to be anemic, sleepy and fatigued. Has normal B 12 and started oral iron.  She has a history of PMR. Polymyalgia rheumatica. She had a normal colonoscopy in 2016. Had lost weight , but recovered . [name] brought her last laboratory results with her, white blood cell count was 3.74K, red blood cell count was 2.9, hemoglobin 9.8 g, hematocrit 28.4%, corpuscular volume 97.1 MCH 33.5. She was over the phone prescribed Nu-iron to take as a supplement. Needs TIBC, Total iron deficiency,   09-26-13- The patient's sleep study from 08-05-13 revealed no apnea, no oxygen desaturation, no irregular heart beats, and only took clusters of limb movements that seem to be related to discomfort with sleeping on the left side. She had no periodic limb movements during REM sleep. There was a prolonged period of slow-wave sleep at around 2 AM during which nor night terrors sleep walking or sleep talking was ordered. The patient reported  that she had another spell- a  feeling as if something brushes up on her scalp, or behind her head.  Since the patient has a long-standing bruit that she can hear at night and that bothers her( tinnitus) I have also suggested to do an MRA of the brain , as I am concerned that they're tortuous vessels , which  may be affecting the left frontal lobe perfusion. This could be a trigger for a nocturnal event as described by the patient clinically. Her MRi documented to my surprise exactly that ! An intracerebral  Venous- cavernous malformation. We are now meeting on 09-26-13 to discuss the results and documented the findings. She had more Night terrors the last weeks. The gabapentin gave her diplopia. I suggest now Topiramate, but she reminded me of involuntary weight loss and loss of apetite. I will try a very low dose of KLONOPIN.  The patient should watch her BP and avoid straining , pressure inducing activities.   RV 02-27-14  Discussed EEG results, no epileptiform activity , but frontal sharps noted. These are of unclear significance.  See report. No change in meds.needed  Patient is free to travel to [place] in April.  The patient,  a caucasian right handed female , originally from [place], reports vivid dreams, with fearful frightful content.  Her dreams have let her to act out some of the dream content, she wakes with her arms extended and with palpitations, tachycardia form fright. She kicks and fell out of bed, yelling,  calling out . Following  one dream last year,  she injured her face in a fall . She begun covering all sharp edges.  She has polymyalgia rheumatica and was treated with prednisone , gaining weight through this time She goes to bed around 11.15 Pm, after watching TV in her living room. Falls asleep promptly , and wakes at 12.15 hearing a noise , that seemed to arise form behind her headboard- the noise goes away once she is awake. She could fall asleep again. Her acting out of dreams arises at 4 AM or later in the last  stages of sleep and gets more frequently over the last year.  She rises at 8.30, the alarm wakes her at 8.  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 No childhood sleep disorder history. Usually a 7 -8 hour sleeper , refreshed.  The patient is widowed for 12 years, has adult children and grandchildren.The bruit in her left temple keeps her form sleeping on the side. She has 3 carotid dopplers over 25 years, diagnosed with  tortious vessel.  MRI brain was normal 4 year ago ( Dr. Carloyn Manner)  No MRA was done at the time , I like to rule out a vascular abnormality at the frontal lobe.  08-04-2015 Her night terrors have responded to the medication, and her spells are rare now. She returned from [place] and had one event which "scared her sister to death "  I have spoken to Dr. Delice Lesch about the possible component of cavernous vascular malformations producing a seizure event. I ordered a 48 hour study but the patient underwent a 72 hour study. This was normal- no seizure activity,  She is doing well on Klonopin. She gained weight on klonopin, (a fact she likes!) She reports vertigo, dizzines with rapid head movementns and bending down, also controlled with Klonopin.  Her eye feel as if pulsating - can this be the AVM? HTN was addressed in the last 2 visits with endocrinology / PCP.  She joked today that her weight has been stabile for so long, she had no excuse for a new wardrobe !  She had no spells for the last 4 month. She needs no refills today.  Interval history from 02/03/2016. [name] reports today that after being almost free of vivid dreams for several month she has returned having some not to the same intensity and not the same frequency as prior to treatment. Some of them are scary they have the character of the night may or or night terror, and she responds was higher heart rate feeling under stress. Klonopin has been working very well at a very low dose for her by now for over 2 years. I would like to  continue using Klonopin with the option that if the medication wears off she could increase the dose. She also does not have insomnia when taking Klonopin. She is not fatigued and she is not excessively daytime sleepy, she also does not report that she is groggy in the mornings. She feels stable well-balanced and not at a risk of falling. I will refill her medication today. I also congratulated her to her 82th birthday, which she celebrates in style this coming Saturday.  Travel history from 15 February 2017, [name] has traveled to summer to [place] with some difficulties.  She had no medical problems however.  She brought me her Klonopin prescription but she had not needed to fill and which is now a year old.  I will refill her  [facility]opin through [facility] today. She has no complain of grogginess or mental cloudiness in the morning. She has night terrors. She has noted that she is not easily scared , not panicked.   07-26-2017, Esra Frankowski is a 84 y.o. female patient for follow up on concerns of cognitive function, memory.  She seems well, is alert and happy. She has a full social life.  She is relieved that the [facility] was normal 26-30 points. Balance is reportedly poor, she is careful.  She continues to report hip pain, and has some night spells- but much better than before Klonopin was initiated.  Was seen by Dr. Einar Gip cardiology for BP variability- he asked  Her to start wearing compression stockings and elevate her feet at night. She has not always hydrated well, and she is struggling with HTN and orthostatic lightheadedness. .   Review of Systems: Out of a complete 14 system review, the patient complains of only the following symptoms, and all other reviewed systems are negative. Acting out dreams, yelling but not leaving the bed.  Social History   Socioeconomic History  . Marital status: Widowed    Spouse name: Not on file  . Number of children: 2  . Years of education: HS  .  Highest education level: Not on file  Occupational History  . Occupation: RETIRED    Employer: [employer]  . Financial resource strain: Not on file  . Food insecurity:    Worry: Not on file    Inability: Not on file  . Transportation needs:    Medical: Not on file    Non-medical: Not on file  Tobacco Use  . Smoking status: Never Smoker  . Smokeless tobacco: Never Used  Substance and Sexual Activity  . Alcohol use: No    Alcohol/week: 0.0 oz  . Drug use: No  . Sexual activity: Not on file  Lifestyle  . Physical activity:    Days per week: Not on file    Minutes per session: Not on file  . Stress: Not on file  Relationships  . Social connections:    Talks on phone: Not on file    Gets together: Not on file    Attends religious service: Not on file    Active member of club or organization: Not on file    Attends meetings of clubs or organizations: Not on file    Relationship status: Not on file  . Intimate partner violence:    Fear of current or ex partner: Not on file    Emotionally abused: Not on file    Physically abused: Not on file    Forced sexual activity: Not on file  Other Topics Concern  . Not on file  Social History Narrative   Patient is widowed.   Patient has two children.   Patient does not drink any caffeine.    Patient has a high school education.   Patient is right-handed.             Family History  Problem Relation Age of Onset  . CVA Father   . Tuberculosis Mother   . Lung cancer Brother     Past Medical History:  Diagnosis Date  . Arthritis   . Depression   . GERD (gastroesophageal reflux disease)   . Hypertension   . Hypothyroidism   . Osteoporosis   . Polymyalgia ([place])     Past Surgical History:  Procedure Laterality Date  . COLONOSCOPY W/ POLYPECTOMY    .  EYE SURGERY     both cataracts  . NASAL SINUS SURGERY    . SHOULDER ARTHROSCOPY  2007   rt   . TONSILLECTOMY    . TRIGGER FINGER RELEASE  03/19/2011    Procedure: RELEASE TRIGGER FINGER/A-1 PULLEY;  Surgeon: Cammie Sickle., MD;  Location: [facility];  Service: Orthopedics;  Laterality: Right;  Procedure:  Release Right Long and Ring Trigger Fingers, Release Left Long Trigger Finger, Injection Left Long Proximal Phalangeal Joint    Current Outpatient Medications  Medication Sig Dispense Refill  . alendronate (FOSAMAX) 70 MG tablet TAKE 1 TABLET BY MOUTH 1  TIME A WEEK. TAKE WITH A  FULL GLASS OF WATER ON AN  EMPTY STOMACH 12 tablet 0  . atorvastatin (LIPITOR) 10 MG tablet     . BIOTIN FORTE PO Take by mouth.    . Calcium Citrate (CITRACAL PO) Take 600 mg by mouth.     . carvedilol (COREG) 3.125 MG tablet 1 tablet every evening.     . clonazePAM (KLONOPIN) 0.5 MG tablet Take 0.5 tablets (0.25 mg total) by mouth at bedtime. Patient takes one half tablet (0.25mg ) daily at bedtime 45 tablet 3  . Docusate Calcium (STOOL SOFTENER PO) Take 1 tablet by mouth daily. 200 mg    . fish oil-omega-3 fatty acids 1000 MG capsule Take 2 g by mouth daily.    . Garlic 6301 MG TBEC Take 1 tablet by mouth daily.    . hydroxychloroquine (PLAQUENIL) 200 MG tablet TAKE 1 TABLET BY MOUTH  DAILY 90 tablet 0  . Iron-Vitamins (GERITOL PO) Take by mouth.    . levothyroxine (SYNTHROID, LEVOTHROID) 50 MCG tablet Take 50 mcg by mouth daily.    Marland Kitchen omeprazole (PRILOSEC) 20 MG capsule Take 20 mg by mouth daily.    . polyethylene glycol (MIRALAX / GLYCOLAX) packet Take 17 g by mouth daily as needed.    . Polysaccharide Iron Complex (POLY-IRON 150 PO) Take 1 tablet by mouth daily.    . Probiotic Product (PROBIOTIC DAILY PO) Take 1 tablet by mouth daily.     No current facility-administered medications for this visit.     Allergies as of 07/26/2017  . (No Known Allergies)    Vitals: BP 135/77 (Patient Position: Standing)   Pulse 80   Ht 5\' 4"  (1.626 m)   Wt 120 lb (54.4 kg)   BMI 20.60 kg/m  Last Weight:  Wt Readings from Last 1 Encounters:   07/26/17 120 lb (54.4 kg)   Last Height:   Ht Readings from Last 1 Encounters:  07/26/17 5\' 4"  (1.626 m)    Physical exam:  General: The patient is awake, alert and appears not in acute distress. The patient is well groomed. Head: Normocephalic, atraumatic. Neck is supple. Mallampati 3 , retrognathia , neck circumference: 14 ,25 inches.  Cardiovascular:  Regular rate and rhythm , without  murmurs , has a  left sided  carotid bruit, no distended neck veins. Respiratory: Lungs are clear to auscultation. Skin:  Without evidence of edema, or rash Trunk:  normal posture.  Neurologic exam : [facility] Cognitive Assessment  07/26/2017 07/26/2017 02/03/2015  Visuospatial/ Executive (0/5) 3 3 4   Naming (0/3) 3 3 3   Attention: Read list of digits (0/2) 2 2 2   Attention: Read list of letters (0/1) 1 1 1   Attention: Serial 7 subtraction starting at 100 (0/3) 3 3 3   Language: Repeat phrase (0/2) 1 1 2   Language : Fluency (0/1)  0 0 1  Abstraction (0/2) 2 2 2   Delayed Recall (0/5) 5 5 3   Orientation (0/6) 6 6 6   Total 26 26 27   Adjusted Score (based on education) - - 27    The patient is awake and alert, oriented to place and time.  Memory see above; MOCA 26-30,  There is a normal attention span & concentration ability. Speech is fluent with mild hoarseness  .Mood and affect are appropriate. Cranial nerves: Pupils are equal and briskly reactive to light. Hearing to finger rub intact.   Facial sensation intact to fine touch. Facial motor strength is symmetric and tongue and uvula move midline.  [name] gait today was very measured but not at all of balance, she can turn with 3 steps 180 degrees, she does not have a drift to the left or right there was no propulsive tendency noted, she could rise from a seated position in a chair without bracing herself, there is no tremor noted normal arm swing.  There is also no focal weakness, focal sensory loss and good coordination.   Handwriting and  drawing do not reveal any trauma.   Cognitive testing as below was unremarkable.   The patient performed a Montreal cognitive assessment which is more difficult than the Mini-Mental Status Examination and mastered it well.    Fatigue was endorsed at 9 points, Epworth sleepiness test endorsed at 4 points, and there was only 1 out of 15 points endorsed on a geriatric depression scale.  I think we are on track is meeting every 6 months from now on.  After physical and neurologic examination, review of laboratory studies, imaging, neurophysiology testing and pre-existing records, assessment is : Treatment plan and additional workup : Stay on Klonopin, low dose 0.25 mg nightly. no insomnia and much less vivid dreams.  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 We discussed possible seizure activity- sometimes in form of vivid dreams.   Sleep spells- Again, treated with Klonopin- Refilled.  Continue with water aerobics and continue to travel. Will do MOCA once a year.    RV every 6 month alternating NP and me- MOCA next time in may 2020.  Larey Seat, MD     07-26-2017   Cc Dr Forde Dandy , Dr Einar Gip.

## 2017-07-28 ENCOUNTER — Other Ambulatory Visit: Payer: Self-pay | Admitting: Neurology

## 2017-07-28 ENCOUNTER — Telehealth: Payer: Self-pay | Admitting: Neurology

## 2017-07-28 DIAGNOSIS — F514 Sleep terrors [night terrors]: Secondary | ICD-10-CM

## 2017-07-28 MED ORDER — CLONAZEPAM 0.5 MG PO TABS: 0.2500 mg | ORAL_TABLET | Freq: Every day | ORAL | 1 refills | Status: DC

## 2017-07-28 NOTE — Telephone Encounter (Signed)
Called the patient to make her aware that we can send the script [facility]. I would like to know If she still has the script and if so can she shred that. I will have Dr Brett Fairy sign and we can fax script for her.   No answer when I called. Please ask pt to shred the script she has and make her aware that I will send another for [facility].

## 2017-07-28 NOTE — Telephone Encounter (Signed)
Pt returning RNs call, advised to shred the script she has and a new one has been sent in to [facility]

## 2017-07-28 NOTE — Telephone Encounter (Signed)
Pt states when she was here Tues she was given a written Rx for clonazePAM (KLONOPIN) 0.5 MG tablet.  Pt states this is filled at  [place], [place] [phone] (Phone) [phone] (Fax)     Pt would like to continue to get her medication thru [facility].  Pt is asking for a call if this can not be processed thru [facility] any longer.  Pt states she has no more refills

## 2017-08-12 ENCOUNTER — Other Ambulatory Visit: Payer: Self-pay | Admitting: Rheumatology

## 2017-08-12 NOTE — Telephone Encounter (Signed)
Last Visit: 05/31/17 Next Visit: 12/01/17 Labs:1/2/19CMP GFR 47.7 ,CBC normal  Okay to refill per Dr. Estanislado Pandy

## 2017-08-25 ENCOUNTER — Telehealth: Payer: Self-pay | Admitting: Rheumatology

## 2017-08-25 NOTE — Telephone Encounter (Signed)
Patient called requesting to talk to you regarding her "spinal stenosis."  Patient is concerned that she is getting worse and asked to speak with you directly.

## 2017-08-26 ENCOUNTER — Telehealth: Payer: Self-pay | Admitting: Rheumatology

## 2017-08-26 NOTE — Telephone Encounter (Signed)
Patient was returning your call. Please call back when available. 

## 2017-08-26 NOTE — Telephone Encounter (Signed)
Attempted to contact patient and left message for patient to call the office.  

## 2017-08-29 NOTE — Telephone Encounter (Signed)
Attempted to contact the patient and and left message for patient to call the office.

## 2017-09-06 NOTE — Progress Notes (Signed)
Office Visit Note  Patient: Gloria Anderson             Date of Birth: [birth year]           MRN: [id number]             PCP: [name], MD Referring: [name], MD Visit Date: 09/20/2017 Occupation: @GUAROCC @    Subjective:  Dizziness.   History of Present Illness: Gloria Anderson is a 84 y.o. female with history of polymyalgia rheumatica, osteoarthritis and DDD.  She states she has been having episodes of orthostatic hypotension.  When she stands up she feels dizzy.  She was seen by Dr. Forde Dandy for elevated blood pressure about 10 days ago and was placed on increased dose of Coreg.  Patient states she had an episode when she passed out and her blood pressure was really low.  She had evaluation by Dr. Irven Shelling office and has appointment coming up with Dr. Einar Gip next week.  She also has discomfort in her lower extremities and paresthesias when she walks.  She states she had an episode where after walking some distance she started having lower extremity discomfort.  There is no increased muscle weakness or tenderness.  There is no history of joint swelling.  Activities of Daily Living:  Patient reports morning stiffness for 0 minute.   Patient Denies nocturnal pain.  Difficulty dressing/grooming: Denies Difficulty climbing stairs: Denies Difficulty getting out of chair: Denies Difficulty using hands for taps, buttons, cutlery, and/or writing: Denies   Review of Systems  Constitutional: Positive for fatigue. Negative for night sweats, weight gain and weight loss.  HENT: Negative for mouth sores, trouble swallowing, trouble swallowing, mouth dryness and nose dryness.   Eyes: Negative for pain, redness, visual disturbance and dryness.  Respiratory: Negative for cough, shortness of breath and difficulty breathing.   Cardiovascular: Negative for chest pain, palpitations, hypertension, irregular heartbeat and swelling in legs/feet.  Gastrointestinal: Negative for blood in  stool, constipation and diarrhea.  Endocrine: Negative for increased urination.  Genitourinary: Negative for vaginal dryness.  Musculoskeletal: Negative for arthralgias, joint pain, joint swelling, myalgias, muscle weakness, morning stiffness, muscle tenderness and myalgias.  Skin: Negative for color change, rash, hair loss, skin tightness, ulcers and sensitivity to sunlight.  Allergic/Immunologic: Negative for susceptible to infections.  Neurological: Positive for dizziness. Negative for memory loss, night sweats and weakness.  Hematological: Negative for swollen glands.  Psychiatric/Behavioral: Negative for depressed mood and sleep disturbance. The patient is not nervous/anxious.     PMFS History:  Patient Active Problem List   Diagnosis Date Noted  . Memory difficulty 07/26/2017  . Other parasomnia 07/26/2017  . Iron deficiency anemia secondary to inadequate dietary iron intake 08/05/2016  . Fatigue associated with anemia 08/05/2016  . Primary osteoarthritis of both feet 02/04/2016  . Essential hypertension 02/04/2016  . PMR (polymyalgia rheumatica) (HCC) 02/03/2016  . Osteoarthritis, hand 02/03/2016  . Age-related osteoporosis without current pathological fracture 02/03/2016  . DDD (degenerative disc disease), lumbar 02/03/2016  . High risk medication use 02/03/2016  . 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 Cerebral cavernous malformation type 1 12/04/2013  . Night terrors, adult 09/26/2013  . HEMORRHOIDS-EXTERNAL 09/19/2009  . GERD 09/19/2009  . CONSTIPATION 09/19/2009  . DYSPHAGIA 09/19/2009  . PERSONAL HISTORY OF COLONIC POLYPS 09/19/2009    Past Medical History:  Diagnosis Date  . Arthritis   . Depression   . GERD (gastroesophageal reflux disease)   . Hypertension   .  Hypothyroidism   . Osteoporosis   . Polymyalgia ([place])     Family History  Problem Relation Age of Onset  . CVA Father   . Tuberculosis Mother   . Lung  cancer Brother    Past Surgical History:  Procedure Laterality Date  . COLONOSCOPY W/ POLYPECTOMY    . EYE SURGERY     both cataracts  . NASAL SINUS SURGERY    . SHOULDER ARTHROSCOPY  2007   rt   . TONSILLECTOMY    . TRIGGER FINGER RELEASE  03/19/2011   Procedure: RELEASE TRIGGER FINGER/A-1 PULLEY;  Surgeon: Cammie Sickle., MD;  Location: [facility];  Service: Orthopedics;  Laterality: Right;  Procedure:  Release Right Long and Ring Trigger Fingers, Release Left Long Trigger Finger, Injection Left Long Proximal Phalangeal Joint   Social History   Social History Narrative   Patient is widowed.   Patient has two children.   Patient does not drink any caffeine.    Patient has a high school education.   Patient is right-handed.              Objective: Vital Signs: BP (!) 150/77 (BP Location: Left Arm, Patient Position: Sitting, Cuff Size: Normal)   Pulse 80   Resp 12   Ht 5\' 4"  (1.626 m)   Wt 120 lb (54.4 kg)   BMI 20.60 kg/m    Physical Exam  Constitutional: She is oriented to person, place, and time. She appears well-developed and well-nourished.  HENT:  Head: Normocephalic and atraumatic.  Eyes: Conjunctivae and EOM are normal.  Neck: Normal range of motion.  Cardiovascular: Normal rate, regular rhythm, normal heart sounds and intact distal pulses.  Pulmonary/Chest: Effort normal and breath sounds normal.  Abdominal: Soft. Bowel sounds are normal.  Lymphadenopathy:    She has no cervical adenopathy.  Neurological: She is alert and oriented to person, place, and time.  Skin: Skin is warm and dry. Capillary refill takes less than 2 seconds.  Psychiatric: She has a normal mood and affect. Her behavior is normal.  Nursing note and vitals reviewed.    Musculoskeletal Exam: C-spine thoracic lumbar spine limited range of motion.  Shoulder joints elbow joints wrist joints are good range of motion.  She has severe PIP and PIP thickening with subluxation  of many DIPs consistent with osteoarthritis.  Hip joints knee joints ankles MTPs with good range of motion with no synovitis.  CDAI Exam: No CDAI exam completed.    Investigation: No additional findings. Labs: 03/22/2017 PLQ eye exam: 09/13/2016 Imaging: No results found.  Speciality Comments: No specialty comments available.    Procedures:  No procedures performed Allergies: Patient has no known allergies.   Assessment / Plan:     Visit Diagnoses: PMR (polymyalgia rheumatica) (HCC) -  in remission.  Patient has no muscle weakness or tenderness on examination today.  High risk medication use - PLQ 10 mg p.o. daily.eye exam: 09/13/2016 - Plan: CBC with Differential/Platelet, COMPLETE METABOLIC PANEL WITH GFR  Primary osteoarthritis of both hands-she has severe osteoarthritis in her hands.  Joint protection muscle strengthening was discussed.  Primary osteoarthritis of both feet-she is not having much discomfort in her feet.  DDD (degenerative disc disease), lumbar-with spinal stenosis.  She has been complaining of some discomfort in her lower extremities after prolonged walking.  I have advised her to discuss this further with Dr. Brett Fairy to evaluate for neurogenic claudication.  Age-related osteoporosis without current pathological fracture - T-score is  minus 2.8, January 2017/ Fosamax 70 mg by mouth q week.  She will discuss repeat DEXA with her PCP.  Patient denies any side effects from Fosamax.  History of anemia-stable  History of hypertension-patient is experiencing orthostatic hypotension and dizziness.  She will be discussing this further with Dr. Brett Fairy.  She also has an appointment coming up with Dr. Einar Gip.  History of gastroesophageal reflux (GERD)-patient states that her reflux is not symptomatic.  She has some hoarseness in her voice.  I am uncertain if it is coming from dry mouth.  If the hoarseness persists will notify me.  Night terrors, adult - Followed up by  neurology.  Patient states the night terrors are better on clonazepam.   Orders: Orders Placed This Encounter  Procedures  . CBC with Differential/Platelet  . COMPLETE METABOLIC PANEL WITH GFR   No orders of the defined types were placed in this encounter.   Face-to-face time spent with patient was 45 minutes. Greater than 50% of time was spent in counseling and coordination of care.  Follow-Up Instructions: Return in about 6 months (around 03/23/2018) for PMR, OA, DDD, OP.   Bo Merino, MD  Note - This record has been created using Editor, commissioning.  Chart creation errors have been sought, but may not always  have been located. Such creation errors do not reflect on  the standard of medical care.

## 2017-09-20 ENCOUNTER — Ambulatory Visit: Payer: Medicare Other | Admitting: Rheumatology

## 2017-09-20 ENCOUNTER — Encounter: Payer: Self-pay | Admitting: Rheumatology

## 2017-09-20 VITALS — BP 150/77 | HR 80 | Resp 12 | Ht 64.0 in | Wt 120.0 lb

## 2017-09-20 DIAGNOSIS — M19041 Primary osteoarthritis, right hand: Secondary | ICD-10-CM

## 2017-09-20 DIAGNOSIS — M19071 Primary osteoarthritis, right ankle and foot: Secondary | ICD-10-CM

## 2017-09-20 DIAGNOSIS — Z79899 Other long term (current) drug therapy: Secondary | ICD-10-CM | POA: Diagnosis not present

## 2017-09-20 DIAGNOSIS — M19072 Primary osteoarthritis, left ankle and foot: Secondary | ICD-10-CM

## 2017-09-20 DIAGNOSIS — Z8679 Personal history of other diseases of the circulatory system: Secondary | ICD-10-CM

## 2017-09-20 DIAGNOSIS — Z8719 Personal history of other diseases of the digestive system: Secondary | ICD-10-CM

## 2017-09-20 DIAGNOSIS — M5136 Other intervertebral disc degeneration, lumbar region: Secondary | ICD-10-CM

## 2017-09-20 DIAGNOSIS — M353 Polymyalgia rheumatica: Secondary | ICD-10-CM

## 2017-09-20 DIAGNOSIS — M810 Age-related osteoporosis without current pathological fracture: Secondary | ICD-10-CM

## 2017-09-20 DIAGNOSIS — Z862 Personal history of diseases of the blood and blood-forming organs and certain disorders involving the immune mechanism: Secondary | ICD-10-CM

## 2017-09-20 DIAGNOSIS — F514 Sleep terrors [night terrors]: Secondary | ICD-10-CM

## 2017-09-20 DIAGNOSIS — M19042 Primary osteoarthritis, left hand: Secondary | ICD-10-CM

## 2017-09-20 LAB — CBC WITH DIFFERENTIAL/PLATELET
Basophils Absolute: 29 cells/uL (ref 0–200)
Basophils Relative: 0.5 %
Eosinophils Absolute: 122 cells/uL (ref 15–500)
Eosinophils Relative: 2.1 %
HCT: 33.4 % — ABNORMAL LOW (ref 35.0–45.0)
Hemoglobin: 11.6 g/dL — ABNORMAL LOW (ref 11.7–15.5)
Lymphs Abs: 957 cells/uL (ref 850–3900)
MCH: 32.7 pg (ref 27.0–33.0)
MCHC: 34.7 g/dL (ref 32.0–36.0)
MCV: 94.1 fL (ref 80.0–100.0)
MPV: 10.8 fL (ref 7.5–12.5)
Monocytes Relative: 7.6 %
Neutro Abs: 4251 cells/uL (ref 1500–7800)
Neutrophils Relative %: 73.3 %
Platelets: 201 10*3/uL (ref 140–400)
RBC: 3.55 10*6/uL — ABNORMAL LOW (ref 3.80–5.10)
RDW: 12.2 % (ref 11.0–15.0)
Total Lymphocyte: 16.5 %
WBC mixed population: 441 cells/uL (ref 200–950)
WBC: 5.8 10*3/uL (ref 3.8–10.8)

## 2017-09-20 LAB — COMPLETE METABOLIC PANEL WITH GFR
AG Ratio: 1.7 (calc) (ref 1.0–2.5)
ALT: 16 U/L (ref 6–29)
AST: 26 U/L (ref 10–35)
Albumin: 4.3 g/dL (ref 3.6–5.1)
Alkaline phosphatase (APISO): 43 U/L (ref 33–130)
BUN/Creatinine Ratio: 20 (calc) (ref 6–22)
BUN: 23 mg/dL (ref 7–25)
CO2: 26 mmol/L (ref 20–32)
Calcium: 9.4 mg/dL (ref 8.6–10.4)
Chloride: 103 mmol/L (ref 98–110)
Creat: 1.13 mg/dL — ABNORMAL HIGH (ref 0.60–0.88)
GFR, Est African American: 53 mL/min/{1.73_m2} — ABNORMAL LOW (ref >=60)
GFR, Est Non African American: 46 mL/min/{1.73_m2} — ABNORMAL LOW (ref >=60)
Globulin: 2.6 g/dL (calc) (ref 1.9–3.7)
Glucose, Bld: 101 mg/dL — ABNORMAL HIGH (ref 65–99)
Potassium: 4.4 mmol/L (ref 3.5–5.3)
Sodium: 139 mmol/L (ref 135–146)
Total Bilirubin: 0.9 mg/dL (ref 0.2–1.2)
Total Protein: 6.9 g/dL (ref 6.1–8.1)

## 2017-09-22 ENCOUNTER — Telehealth: Payer: Self-pay | Admitting: Rheumatology

## 2017-09-22 NOTE — Telephone Encounter (Signed)
Copy faxed to PCP.

## 2017-09-22 NOTE — Telephone Encounter (Signed)
Patient called requesting that the results of her bloodwork be faxed to her PCP Dr. [name].

## 2017-10-03 ENCOUNTER — Other Ambulatory Visit: Payer: Self-pay | Admitting: Neurology

## 2017-10-03 ENCOUNTER — Telehealth: Payer: Self-pay | Admitting: Neurology

## 2017-10-03 NOTE — Telephone Encounter (Signed)
Called the patient and made her aware that in her last visit she discussed balance issues. Asked what balance concerns she is having. The patient states that she was diagnosed with having spinal stenosis. She states that she was taking a walk with her friend and she said during the walk it was like she froze up and was unable to move her legs. She states she lost feeling in her ankles. She reached out to the PCP who states this could be related to her spinal stenosis. The patient was advised to follow up with neurology and her rheumatologist. Scheduled an apt with the NP to evaluate the need for a referral to NS if necessary. Pt verbalized understanding.

## 2017-10-03 NOTE — Telephone Encounter (Signed)
Pt requesting a call stating she is having balance issues, pts PCP suggestied she f/u with Dr. Brett Fairy sooner than December. Please call to advise

## 2017-10-05 ENCOUNTER — Encounter: Payer: Self-pay | Admitting: Adult Health

## 2017-10-05 ENCOUNTER — Ambulatory Visit: Payer: Medicare Other | Admitting: Adult Health

## 2017-10-05 VITALS — BP 148/76 | HR 76 | Ht 64.0 in | Wt 120.2 lb

## 2017-10-05 DIAGNOSIS — M48061 Spinal stenosis, lumbar region without neurogenic claudication: Secondary | ICD-10-CM | POA: Diagnosis not present

## 2017-10-05 DIAGNOSIS — R269 Unspecified abnormalities of gait and mobility: Secondary | ICD-10-CM

## 2017-10-05 NOTE — Patient Instructions (Signed)
Your Plan:  Continue to monitor symptoms  If you have another episode we can repeat MRI lumbar spine If your symptoms worsen or you develop new symptoms please let us know.   Thank you for coming to see [place] at [facility]. I hope we have been able to provide you high quality care today.  You may receive a patient satisfaction survey over the next few weeks. We would appreciate your feedback and comments so that we may continue to improve ourselves and the health of our patients.

## 2017-10-05 NOTE — Progress Notes (Deleted)
Office Visit Note  Patient: Gloria Anderson             Date of Birth: 01-19-1934           MRN: [id number]             PCP: [name], MD Referring: [name], MD Visit Date: 10/06/2017 Occupation: @GUAROCC @  Subjective:  No chief complaint on file.   History of Present Illness: Gloria Anderson is a 84 y.o. female ***   Activities of Daily Living:  Patient reports morning stiffness for *** {minute/hour:19697}.   Patient {ACTIONS;DENIES/REPORTS:21021675::"Denies"} nocturnal pain.  Difficulty dressing/grooming: {ACTIONS;DENIES/REPORTS:21021675::"Denies"} Difficulty climbing stairs: {ACTIONS;DENIES/REPORTS:21021675::"Denies"} Difficulty getting out of chair: {ACTIONS;DENIES/REPORTS:21021675::"Denies"} Difficulty using hands for taps, buttons, cutlery, and/or writing: {ACTIONS;DENIES/REPORTS:21021675::"Denies"}  No Rheumatology ROS completed.   PMFS History:  Patient Active Problem List   Diagnosis Date Noted  . Memory difficulty 07/26/2017  . Other parasomnia 07/26/2017  . Iron deficiency anemia secondary to inadequate dietary iron intake 08/05/2016  . Fatigue associated with anemia 08/05/2016  . Primary osteoarthritis of both feet 02/04/2016  . Essential hypertension 02/04/2016  . PMR (polymyalgia rheumatica) (HCC) 02/03/2016  . Osteoarthritis, hand 02/03/2016  . Age-related osteoporosis without current pathological fracture 02/03/2016  . DDD (degenerative disc disease), lumbar 02/03/2016  . High risk medication use 02/03/2016  . 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 Cerebral cavernous malformation type 1 12/04/2013  . Night terrors, adult 09/26/2013  . HEMORRHOIDS-EXTERNAL 09/19/2009  . GERD 09/19/2009  . CONSTIPATION 09/19/2009  . DYSPHAGIA 09/19/2009  . PERSONAL HISTORY OF COLONIC POLYPS 09/19/2009    Past Medical History:  Diagnosis Date  . Arthritis   . Depression   . GERD (gastroesophageal  reflux disease)   . Hypertension   . Hypothyroidism   . Osteoporosis   . Polymyalgia ([place])     Family History  Problem Relation Age of Onset  . CVA Father   . Tuberculosis Mother   . Lung cancer Brother    Past Surgical History:  Procedure Laterality Date  . COLONOSCOPY W/ POLYPECTOMY    . EYE SURGERY     both cataracts  . NASAL SINUS SURGERY    . SHOULDER ARTHROSCOPY  2007   rt   . TONSILLECTOMY    . TRIGGER FINGER RELEASE  03/19/2011   Procedure: RELEASE TRIGGER FINGER/A-1 PULLEY;  Surgeon: Cammie Sickle., MD;  Location: [facility];  Service: Orthopedics;  Laterality: Right;  Procedure:  Release Right Long and Ring Trigger Fingers, Release Left Long Trigger Finger, Injection Left Long Proximal Phalangeal Joint   Social History   Social History Narrative   Patient is widowed.   Patient has two children.   Patient does not drink any caffeine.    Patient has a high school education.   Patient is right-handed.             Objective: Vital Signs: There were no vitals taken for this visit.   Physical Exam   Musculoskeletal Exam: ***  CDAI Exam: No CDAI exam completed.   Investigation: No additional findings.  Imaging: No results found.  Recent Labs: Lab Results  Component Value Date   WBC 5.8 09/20/2017   HGB 11.6 (L) 09/20/2017   PLT 201 09/20/2017   NA 139 09/20/2017   K 4.4 09/20/2017   CL 103 09/20/2017   CO2 26 09/20/2017   GLUCOSE 101 (H) 09/20/2017   BUN 23 09/20/2017   CREATININE 1.13 (H) 09/20/2017  BILITOT 0.9 09/20/2017   AST 26 09/20/2017   ALT 16 09/20/2017   PROT 6.9 09/20/2017   CALCIUM 9.4 09/20/2017   GFRAA 53 (L) 09/20/2017    Speciality Comments: No specialty comments available.  Procedures:  No procedures performed Allergies: Patient has no known allergies.   Assessment / Plan:     Visit Diagnoses: No diagnosis found.   Orders: No orders of the defined types were placed in this encounter.  No  orders of the defined types were placed in this encounter.   Face-to-face time spent with patient was *** minutes. Greater than 50% of time was spent in counseling and coordination of care.  Follow-Up Instructions: No follow-ups on file.   Earnestine Mealing, CMA  Note - This record has been created using Editor, commissioning.  Chart creation errors have been sought, but may not always  have been located. Such creation errors do not reflect on  the standard of medical care.

## 2017-10-05 NOTE — Progress Notes (Signed)
PATIENT: Gloria Anderson DOB: 06-Aug-1933  REASON FOR VISIT: follow up HISTORY FROM: patient  HISTORY OF PRESENT ILLNESS: Today 10/05/17:  Gloria Anderson is an 84 year old female with a history of memory disturbance and night terrors.  She returns today to discuss her balance.  She states that she went to see her primary care after she had an episode while ambulating.  She reports that she was walking with a neighbor.  15 minutes in to her walk she lost sensation in the lower part of her back and in her ankles.  She reports that she felt like she had no control over her balance.  She states that she had to sit down.  She reports after that event she had some mild episodes but since then this has resolved and she has not had any trouble walking.  She states that she went for a walk last night and did not have any symptoms.  She states that she has found if she uses a cane this offers her more stability.  In the past CT of the lumbar spine as well as MRI of the lumbar spine shows stenosis.  She denies any changes with her bowels or bladder.  Denies any numbness or tingling in the lower extremities.  She returns today for an evaluation.  HISTORY Gloria Anderson is a 84 y.o. female patient for follow up on concerns of cognitive function, memory.  She seems well, is alert and happy. She has a full social life.  She is relieved that the [facility] was normal 26-30 points. Balance is reportedly poor, she is careful.  She continues to report hip pain, and has some night spells- but much better than before Klonopin was initiated.  Was seen by Dr. Einar Gip cardiology for BP variability- he asked  Her to start wearing compression stockings and elevate her feet at night. She has not always hydrated well, and she is struggling with HTN and orthostatic lightheadedness.   REVIEW OF SYSTEMS: Out of a complete 14 system review of symptoms, the patient complains only of the following symptoms, and all other reviewed  systems are negative.  See HPI  ALLERGIES: No Known Allergies  HOME MEDICATIONS: Outpatient Medications Prior to Visit  Medication Sig Dispense Refill  . alendronate (FOSAMAX) 70 MG tablet TAKE 1 TABLET BY MOUTH 1  TIME A WEEK. TAKE WITH A  FULL GLASS OF WATER ON AN  EMPTY STOMACH 12 tablet 0  . atorvastatin (LIPITOR) 10 MG tablet     . BIOTIN FORTE PO Take by mouth.    . Calcium Citrate (CITRACAL PO) Take 600 mg by mouth.     . carvedilol (COREG) 3.125 MG tablet 1 tablet 2 (two) times daily with a meal.     . clonazePAM (KLONOPIN) 0.5 MG tablet Take 0.5 tablets (0.25 mg total) by mouth at bedtime. Patient takes one half tablet (0.25mg ) daily at bedtime 45 tablet 1  . Docusate Calcium (STOOL SOFTENER PO) Take 1 tablet by mouth daily. 200 mg    . fish oil-omega-3 fatty acids 1000 MG capsule Take 2 g by mouth daily.    . Garlic 2330 MG TBEC Take 1 tablet by mouth daily.    . hydroxychloroquine (PLAQUENIL) 200 MG tablet TAKE 1 TABLET BY MOUTH  DAILY 90 tablet 0  . Iron-Vitamins (GERITOL PO) Take by mouth.    . levothyroxine (SYNTHROID, LEVOTHROID) 50 MCG tablet Take 50 mcg by mouth daily.    Marland Kitchen omeprazole (PRILOSEC) 20 MG capsule Take  20 mg by mouth daily.    . polyethylene glycol (MIRALAX / GLYCOLAX) packet Take 17 g by mouth daily as needed.    . Polysaccharide Iron Complex (POLY-IRON 150 PO) Take 1 tablet by mouth daily.    . Probiotic Product (PROBIOTIC DAILY PO) Take 1 tablet by mouth daily.     No facility-administered medications prior to visit.     PAST MEDICAL HISTORY: Past Medical History:  Diagnosis Date  . Arthritis   . Depression   . GERD (gastroesophageal reflux disease)   . Hypertension   . Hypothyroidism   . Osteoporosis   . Polymyalgia ([place])     PAST SURGICAL HISTORY: Past Surgical History:  Procedure Laterality Date  . COLONOSCOPY W/ POLYPECTOMY    . EYE SURGERY     both cataracts  . NASAL SINUS SURGERY    . SHOULDER ARTHROSCOPY  2007   rt   .  TONSILLECTOMY    . TRIGGER FINGER RELEASE  03/19/2011   Procedure: RELEASE TRIGGER FINGER/A-1 PULLEY;  Surgeon: Cammie Sickle., MD;  Location: [facility];  Service: Orthopedics;  Laterality: Right;  Procedure:  Release Right Long and Ring Trigger Fingers, Release Left Long Trigger Finger, Injection Left Long Proximal Phalangeal Joint    FAMILY HISTORY: Family History  Problem Relation Age of Onset  . CVA Father   . Tuberculosis Mother   . Lung cancer Brother     SOCIAL HISTORY: Social History   Socioeconomic History  . Marital status: Widowed    Spouse name: Not on file  . Number of children: 2  . Years of education: HS  . Highest education level: Not on file  Occupational History  . Occupation: RETIRED    Employer: [employer]  . Financial resource strain: Not on file  . Food insecurity:    Worry: Not on file    Inability: Not on file  . Transportation needs:    Medical: Not on file    Non-medical: Not on file  Tobacco Use  . Smoking status: Never Smoker  . Smokeless tobacco: Never Used  Substance and Sexual Activity  . Alcohol use: No    Alcohol/week: 0.0 oz  . Drug use: Never  . Sexual activity: Not on file  Lifestyle  . Physical activity:    Days per week: Not on file    Minutes per session: Not on file  . Stress: Not on file  Relationships  . Social connections:    Talks on phone: Not on file    Gets together: Not on file    Attends religious service: Not on file    Active member of club or organization: Not on file    Attends meetings of clubs or organizations: Not on file    Relationship status: Not on file  . Intimate partner violence:    Fear of current or ex partner: Not on file    Emotionally abused: Not on file    Physically abused: Not on file    Forced sexual activity: Not on file  Other Topics Concern  . Not on file  Social History Narrative   Patient is widowed.   Patient has two children.   Patient does  not drink any caffeine.    Patient has a high school education.   Patient is right-handed.               PHYSICAL EXAM  Vitals:   10/05/17 1409  BP: Marland Kitchen)  148/76  Pulse: 76  Weight: 120 lb 3.2 oz (54.5 kg)  Height: 5\' 4"  (1.626 m)   Body mass index is 20.63 kg/m.  Generalized: Well developed, in no acute distress   Neurological examination  Mentation: Alert oriented to time, place, history taking. Follows all commands speech and language fluent Cranial nerve II-XII: Pupils were equal round reactive to light. Extraocular movements were full, visual field were full on confrontational test. Facial sensation and strength were normal. Uvula tongue midline. Head turning and shoulder shrug  were normal and symmetric. Motor: The motor testing reveals 5 over 5 strength of all 4 extremities. Good symmetric motor tone is noted throughout.  Sensory: Sensory testing is intact to soft touch on all 4 extremities. No evidence of extinction is noted.  Coordination: Cerebellar testing reveals good finger-nose-finger and heel-to-shin bilaterally.  Gait and station: Gait is normal. Tandem gait is normal. Romberg is negative. No drift is seen.  Toe and heel walking normal Reflexes: Deep tendon reflexes are symmetric and normal bilaterally.   DIAGNOSTIC DATA (LABS, IMAGING, TESTING) - I reviewed patient records, labs, notes, testing and imaging myself where available.  Lab Results  Component Value Date   WBC 5.8 09/20/2017   HGB 11.6 (L) 09/20/2017   HCT 33.4 (L) 09/20/2017   MCV 94.1 09/20/2017   PLT 201 09/20/2017      Component Value Date/Time   NA 139 09/20/2017 1425   K 4.4 09/20/2017 1425   CL 103 09/20/2017 1425   CO2 26 09/20/2017 1425   GLUCOSE 101 (H) 09/20/2017 1425   BUN 23 09/20/2017 1425   CREATININE 1.13 (H) 09/20/2017 1425   CALCIUM 9.4 09/20/2017 1425   PROT 6.9 09/20/2017 1425   AST 26 09/20/2017 1425   ALT 16 09/20/2017 1425   BILITOT 0.9 09/20/2017 1425    GFRNONAA 46 (L) 09/20/2017 1425   GFRAA 53 (L) 09/20/2017 1425     ASSESSMENT AND PLAN 84 y.o. year old female  has a past medical history of Arthritis, Depression, GERD (gastroesophageal reflux disease), Hypertension, Hypothyroidism, Osteoporosis, and Polymyalgia ([place]). here with:  1.  Abnormality of gait and balance 2.  Spinal stenosis  The patient's physical exam was relatively unremarkable. I discussed with the patient about repeating MRI of the lumbar spine however the patient deferred.  She states that if she has any additional episodes she will call to schedule an MRI.  She states that her symptoms have resolved however if they return she will let us know.  She will follow-up in 6 months or sooner if needed.   Ward Givens, MSN, NP-C 10/05/2017, 1:55 PM [facility] [address], [place], [place] [address] [phone]

## 2017-10-06 ENCOUNTER — Encounter: Payer: Self-pay | Admitting: Rheumatology

## 2017-10-06 ENCOUNTER — Other Ambulatory Visit (INDEPENDENT_AMBULATORY_CARE_PROVIDER_SITE_OTHER): Payer: Self-pay | Admitting: *Deleted

## 2017-10-06 ENCOUNTER — Ambulatory Visit: Payer: Medicare Other | Admitting: Rheumatology

## 2017-10-06 VITALS — BP 87/49 | HR 84 | Resp 12 | Ht 64.0 in | Wt 121.4 lb

## 2017-10-06 DIAGNOSIS — M65342 Trigger finger, left ring finger: Secondary | ICD-10-CM | POA: Diagnosis not present

## 2017-10-06 MED ORDER — TRIAMCINOLONE ACETONIDE 40 MG/ML IJ SUSP
10.0000 mg | INTRAMUSCULAR | Status: AC | PRN
  Administered 2017-10-06: 10 mg

## 2017-10-06 MED ORDER — LIDOCAINE HCL 1 % IJ SOLN
0.5000 mL | INTRAMUSCULAR | Status: AC | PRN
Start: 2017-10-06 — End: 2017-10-06
  Administered 2017-10-06: .5 mL

## 2017-10-06 NOTE — Progress Notes (Signed)
   Procedure Note  Patient: Gloria Anderson             Date of Birth: 1933-06-18           MRN: [id number]             Visit Date: 10/06/2017  Procedures: Visit Diagnoses: Trigger ring finger of left hand  Hand/UE Inj for trigger finger on 10/06/2017 3:45 PM Indications: pain, tendon swelling and therapeutic Details: 27 G needle, ultrasound-guided volar approach Medications: 0.5 mL lidocaine 1 %; 10 mg triamcinolone acetonide 40 MG/ML Aspirate: 0 mL Procedure, treatment alternatives, risks and benefits explained, specific risks discussed. Immediately prior to procedure a time out was called to verify the correct patient, procedure, equipment, support staff and site/side marked as required. Patient was prepped and draped in the usual sterile fashion.    patient tolerated the procedure well.  A splint was applied.  Postprocedure precautions were discussed. Bo Merino, MD

## 2017-10-17 ENCOUNTER — Telehealth: Payer: Self-pay | Admitting: Adult Health

## 2017-10-17 NOTE — Telephone Encounter (Addendum)
Patient advised of MD's response and scheduled for 10/21/2017 at 1000.  MB RN.

## 2017-10-17 NOTE — Telephone Encounter (Signed)
Pt call stating that Saturday night 8/17 she had been walking all around the house and had ran into several things but not injured during her sleep. Pt stating she woke up and went back to bed but was very shaken up and still is. Pt would like a call to discuss further.

## 2017-10-17 NOTE — Telephone Encounter (Signed)
I contacted the patient. Patient stated on 10/15/2017 she woke up in the middle of the night walking around her house. Patient stated she had to lean against the wall to reorient her self and she was frightened. Patient stated she has not walked in her sleep in years and is concerned about this issue. Patient had no injuries from 10/15/2017. I asked if any of her medications had changed and was advised on 09/28/2017 hydralazine 25 mg was started for her BP. Patient can take up to 3 pills per day if her BP stays elevated. Patient states she has only had to take 2 pills since starting the prescription.   I offered an appt with Hedwig Morton, NP to be evaluated on 10/19/2017, but patient declined.  Patient has an appointment with Dr. Brett Fairy 01-30-2018, but requested her opinion/reccomendations on this matter. Patient was advised at this time we do not have any sooner appointments to schedule with Dr. Brett Fairy. Patient placed on the waiting list.   MB RN.

## 2017-10-17 NOTE — Telephone Encounter (Signed)
Error MB RN 

## 2017-10-17 NOTE — Telephone Encounter (Signed)
Please let her come in on Friday at 10 AM- I will see her. CD

## 2017-10-18 NOTE — Telephone Encounter (Signed)
Had an opening on Thursday 8/22 offered the pt the 2:30 slot to help address her concerns.

## 2017-10-20 ENCOUNTER — Encounter: Payer: Self-pay | Admitting: Neurology

## 2017-10-20 ENCOUNTER — Ambulatory Visit: Payer: Medicare Other | Admitting: Neurology

## 2017-10-20 VITALS — BP 128/72 | HR 82 | Ht 64.0 in | Wt 119.0 lb

## 2017-10-20 DIAGNOSIS — F513 Sleepwalking [somnambulism]: Secondary | ICD-10-CM

## 2017-10-20 MED ORDER — TRAZODONE HCL 50 MG PO TABS: 25.0000 mg | ORAL_TABLET | Freq: Every day | ORAL | 1 refills | Status: DC

## 2017-10-20 NOTE — Progress Notes (Signed)
[facility]  Provider:  Larey Anderson, M D  Referring Provider: [name], MD Primary Care Physician:  Gloria Bowen, MD  Chief Complaint  Patient presents with  . Follow-up    Room 10 alone, Patient here to discuss on 10/15/2017 she had a dream that she was trying to get out of bed and when she woke she had gotten out of bed and was stumbling. She did not fall. Patient states this is the first time she has gotten out bed while sleeping.     HPI:        This patient of Dr. 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 She tolerates Klonopin and is happy with the REM sleep control.  She has developed higher blood pressures spurs of higher blood pressures that Dr. Forde Anderson and Dr. Einar Anderson have followed, and initiated treatment- she developed orthostatic BP!Marland Kitchen She was found to be anemic, sleepy and fatigued. Has normal B 12 and started oral iron.  She has a history of PMR. Polymyalgia rheumatica. She had a normal colonoscopy in 2016. Had lost weight , but recovered . Gloria Anderson brought her last laboratory results with her, white blood cell count was 3.74K, red blood cell count was 2.9, hemoglobin 9.8 g, hematocrit 28.4%, corpuscular volume 97.1 MCH 33.5. She was over the phone prescribed Nu-iron to take as a supplement. Needs TIBC, Total iron deficiency,   09-26-13- The patient's sleep study from 08-05-13 revealed no apnea, no oxygen desaturation, no irregular heart beats, and only took clusters of limb movements that seem to be related to discomfort with sleeping on the left side. She had no periodic limb movements during REM sleep. There was a prolonged period of slow-wave sleep at around 2 AM during which nor night terrors sleep walking or sleep talking was ordered. The patient reported  that she had another spell- a feeling as if something  brushes up on her scalp, or behind her head.  Since the patient has a long-standing bruit that she can hear at night and that bothers her( tinnitus) I have also suggested to do an MRA of the brain , as I am concerned that they're tortuous vessels , which  may be affecting the left frontal lobe perfusion. This could be a trigger for a nocturnal event as described by the patient clinically. Her MRi documented to my surprise exactly that ! An intracerebral  Venous- cavernous malformation. We are now meeting on 09-26-13 to discuss the results and documented the findings. She had more Night terrors the last weeks. The gabapentin gave her diplopia. I suggest now Topiramate, but she reminded me of involuntary weight loss and loss of apetite. I will try a very low dose of KLONOPIN.  The patient should watch her BP and avoid straining , pressure inducing activities.   RV 02-27-14  Discussed EEG results, no epileptiform activity , but frontal sharps noted. These are of unclear significance.  See report. No change in meds.needed  Patient is free to travel to [place] in April.  The patient,  a caucasian right handed female , originally from [place], reports vivid dreams, with fearful frightful content.  Her dreams have let her to act out some of the dream content, she wakes with her arms extended and with palpitations, tachycardia form fright. She kicks and fell out of bed, yelling, calling out . Following  one dream last  year,  she injured her face in a fall . She begun covering all sharp edges.  She has polymyalgia rheumatica and was treated with prednisone , gaining weight through this time She goes to bed around 11.15 Pm, after watching TV in her living room. Falls asleep promptly , and wakes at 12.15 hearing a noise , that seemed to arise form behind her headboard- the noise goes away once she is awake. She could fall asleep again. Her acting out of dreams arises at 4 AM or later in the last stages of sleep and gets  more frequently over the last year.  She rises at 8.30, the alarm wakes her at 8.  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 No childhood sleep disorder history. Usually a 7 -8 hour sleeper , refreshed.  The patient is widowed for 12 years, has adult children and grandchildren.The bruit in her left temple keeps her form sleeping on the side. She has 3 carotid dopplers over 25 years, diagnosed with  tortious vessel.  MRI brain was normal 4 year ago ( Dr. Carloyn Anderson)  No MRA was done at the time , I like to rule out a vascular abnormality at the frontal lobe.  08-04-2015 Her night terrors have responded to the medication, and her spells are rare now. She returned from [place] and had one event which "scared her sister to death "  I have spoken to Dr. Delice Anderson about the possible component of cavernous vascular malformations producing a seizure event. I ordered a 48 hour study but the patient underwent a 72 hour study. This was normal- no seizure activity,  She is doing well on Klonopin. She gained weight on klonopin, (a fact she likes!) She reports vertigo, dizzines with rapid head movementns and bending down, also controlled with Klonopin.  Her eye feel as if pulsating - can this be the AVM? HTN was addressed in the last 2 visits with endocrinology / PCP.  She joked today that her weight has been stabile for so long, she had no excuse for a new wardrobe !  She had no spells for the last 4 month. She needs no refills today.  Interval history from 02/03/2016. Gloria Anderson reports today that after being almost free of vivid dreams for several month she has returned having some not to the same intensity and not the same frequency as prior to treatment. Some of them are scary they have the character of the night may or or night terror, and she responds was higher heart rate feeling under stress. Klonopin has been working very well at a very low dose for her by now for over 2 years. I would like to continue using Klonopin with  the option that if the medication wears off she could increase the dose. She also does not have insomnia when taking Klonopin. She is not fatigued and she is not excessively daytime sleepy, she also does not report that she is groggy in the mornings. She feels stable well-balanced and not at a risk of falling. I will refill her medication today. I also congratulated her to her 82th birthday, which she celebrates in style this coming Saturday.  Travel history from 15 February 2017, Gloria Anderson has traveled to summer to [place] with some difficulties.  She had no medical problems however.  She brought me her Klonopin prescription but she had not needed to fill and which is now a year old.  I will refill her Klonopin through [facility] today. She has no  complain of grogginess or mental cloudiness in the morning. She has night terrors. She has noted that she is not easily scared , not panicked.   07-26-2017, Gloria Anderson is a 84 y.o. female patient for follow up on concerns of cognitive function, memory.  She seems well, is alert and happy. She has a full social life.  She is relieved that the [facility] was normal 26-30 points. Balance is reportedly poor, she is careful.  She continues to report hip pain, and has some night spells- but much better than before Klonopin was initiated.  Was seen by Dr. Einar Anderson cardiology for BP variability- he asked  Her to start wearing compression stockings and elevate her feet at night. She has not always hydrated well, and she is struggling with HTN and orthostatic lightheadedness.   Today 10/05/17:  Gloria Anderson is an 84 year old female with a history of memory disturbance and night terrors.  She returns today to discuss her balance.  She states that she went to see her primary care after she had an episode while ambulating.  She reports that she was walking with a neighbor.  15 minutes in to her walk she lost sensation in the lower part of her back and in her ankles.   She reports that she felt like she had no control over her balance.  She states that she had to sit down.  She reports after that event she had some mild episodes but since then this has resolved and she has not had any trouble walking.  She states that she went for a walk last night and did not have any symptoms.  She states that she has found if she uses a cane this offers her more stability.  In the past CT of the lumbar spine as well as MRI of the lumbar spine shows stenosis.  She denies any changes with her bowels or bladder.  Denies any numbness or tingling in the lower extremities.  She returns today for an evaluation.  HISTORY Gloria Anderson is a 84 y.o. female patient for follow up on concerns of cognitive function, memory.  She seems well, is alert and happy. She has a full social life.  She is relieved that the [facility] was normal 26-30 points. Balance is reportedly poor, she is careful.  She continues to report hip pain, and has some night spells- but much better than before Klonopin was initiated.  Was seen by Dr. Einar Anderson cardiology for BP variability- he asked  Her to start wearing compression stockings and elevate her feet at night. She has not always hydrated well, and she is struggling with HTN and orthostatic lightheadedness.   10-20-2017, Gloria Anderson, who is  84 years old reports a sleep walking episode that shook her- she dreamt about a scary situation, left her bed , walked about and finally woke up - realized she had dreamt and is not concerned about repeating this episodes with a risk of injury.  She started on Hydralazine as a new BP medication- could this have contributed/ she has been well controlled on Klonopin, and we will add trazodone tonight.      Review of Systems: Out of a complete 14 system review, the patient complains of only the following symptoms, and all other reviewed systems are negative. Acting out dreams, yelling but not leaving the bed.  Sleep walking-    Social History   Socioeconomic History  . Marital status: Widowed    Spouse name: Not on file  .  Number of children: 2  . Years of education: HS  . Highest education level: Not on file  Occupational History  . Occupation: RETIRED    Employer: [employer]  . Financial resource strain: Not on file  . Food insecurity:    Worry: Not on file    Inability: Not on file  . Transportation needs:    Medical: Not on file    Non-medical: Not on file  Tobacco Use  . Smoking status: Never Smoker  . Smokeless tobacco: Never Used  Substance and Sexual Activity  . Alcohol use: No    Alcohol/week: 0.0 standard drinks  . Drug use: Never  . Sexual activity: Not on file  Lifestyle  . Physical activity:    Days per week: Not on file    Minutes per session: Not on file  . Stress: Not on file  Relationships  . Social connections:    Talks on phone: Not on file    Gets together: Not on file    Attends religious service: Not on file    Active member of club or organization: Not on file    Attends meetings of clubs or organizations: Not on file    Relationship status: Not on file  . Intimate partner violence:    Fear of current or ex partner: Not on file    Emotionally abused: Not on file    Physically abused: Not on file    Forced sexual activity: Not on file  Other Topics Concern  . Not on file  Social History Narrative   Patient is widowed.   Patient has two children.   Patient does not drink any caffeine.    Patient has a high school education.   Patient is right-handed.             Family History  Problem Relation Age of Onset  . CVA Father   . Tuberculosis Mother   . Lung cancer Brother     Past Medical History:  Diagnosis Date  . Arthritis   . Depression   . GERD (gastroesophageal reflux disease)   . Hypertension   . Hypothyroidism   . Osteoporosis   . Polymyalgia ([place])     Past Surgical History:  Procedure Laterality Date  . COLONOSCOPY W/  POLYPECTOMY    . EYE SURGERY     both cataracts  . NASAL SINUS SURGERY    . SHOULDER ARTHROSCOPY  2007   rt   . TONSILLECTOMY    . TRIGGER FINGER RELEASE  03/19/2011   Procedure: RELEASE TRIGGER FINGER/A-1 PULLEY;  Surgeon: Cammie Sickle., MD;  Location: [facility];  Service: Orthopedics;  Laterality: Right;  Procedure:  Release Right Long and Ring Trigger Fingers, Release Left Long Trigger Finger, Injection Left Long Proximal Phalangeal Joint    Current Outpatient Medications  Medication Sig Dispense Refill  . alendronate (FOSAMAX) 70 MG tablet TAKE 1 TABLET BY MOUTH 1  TIME A WEEK. TAKE WITH A  FULL GLASS OF WATER ON AN  EMPTY STOMACH 12 tablet 0  . antiseptic oral rinse (BIOTENE) LIQD 15 mLs by Mouth Rinse route as needed for dry mouth.    Marland Kitchen atorvastatin (LIPITOR) 10 MG tablet     . BIOTIN FORTE PO Take by mouth.    Marland Kitchen BIOTIN FORTE PO Take by mouth. Takes one daily.    . Calcium Citrate (CITRACAL PO) Take 600 mg by mouth.     . carvedilol (COREG)  3.125 MG tablet 1 tablet 2 (two) times daily with a meal.     . clonazePAM (KLONOPIN) 0.5 MG tablet Take 0.5 tablets (0.25 mg total) by mouth at bedtime. Patient takes one half tablet (0.25mg ) daily at bedtime 45 tablet 1  . Docusate Calcium (STOOL SOFTENER PO) Take 1 tablet by mouth daily. 200 mg    . fish oil-omega-3 fatty acids 1000 MG capsule Take 2 g by mouth daily.    . Garlic 9449 MG TBEC Take 1 tablet by mouth daily.    . hydrALAZINE (APRESOLINE) 25 MG tablet     . hydroxychloroquine (PLAQUENIL) 200 MG tablet TAKE 1 TABLET BY MOUTH  DAILY 90 tablet 0  . Iron-Vitamins (GERITOL PO) Take by mouth.    . levothyroxine (SYNTHROID, LEVOTHROID) 50 MCG tablet Take 50 mcg by mouth daily.    Marland Kitchen omeprazole (PRILOSEC) 20 MG capsule Take 20 mg by mouth daily.    . polyethylene glycol (MIRALAX / GLYCOLAX) packet Take 17 g by mouth daily as needed.    . Probiotic Product (PROBIOTIC DAILY PO) Take 1 tablet by mouth daily.     No  current facility-administered medications for this visit.     Allergies as of 10/20/2017  . (No Known Allergies)    Vitals: BP 128/72 (Patient Position: Standing)   Pulse 82   Ht 5\' 4"  (1.626 m)   Wt 119 lb (54 kg)   BMI 20.43 kg/m  Last Weight:  Wt Readings from Last 1 Encounters:  10/20/17 119 lb (54 kg)   Last Height:   Ht Readings from Last 1 Encounters:  10/20/17 5\' 4"  (1.626 m)    Physical exam:  General: The patient is awake, alert and appears not in acute distress. The patient is well groomed. Head: Normocephalic, atraumatic. Neck is supple. Mallampati 3 , retrognathia , neck circumference: 14 ,25 inches.  Cardiovascular:  Regular rate and rhythm , without  murmurs , has a  left sided  carotid bruit, no distended neck veins. Respiratory: Lungs are clear to auscultation. Skin:  Without evidence of edema, or rash Trunk:  normal posture.  Neurologic exam : Montreal Cognitive Assessment  07/26/2017 07/26/2017 02/03/2015  Visuospatial/ Executive (0/5) 3 3 4   Naming (0/3) 3 3 3   Attention: Read list of digits (0/2) 2 2 2   Attention: Read list of letters (0/1) 1 1 1   Attention: Serial 7 subtraction starting at 100 (0/3) 3 3 3   Language: Repeat phrase (0/2) 1 1 2   Language : Fluency (0/1) 0 0 1  Abstraction (0/2) 2 2 2   Delayed Recall (0/5) 5 5 3   Orientation (0/6) 6 6 6   Total 26 26 27   Adjusted Score (based on education) - - 27    The patient is awake and alert, oriented to place and time.  Memory see above; MOCA 26-30,  There is a normal attention span & concentration ability. Speech is fluent with mild hoarseness  .Mood and affect are appropriate. Cranial nerves: Pupils are equal and briskly reactive to light. Hearing to finger rub intact.   Facial sensation intact to fine touch. Facial motor strength is symmetric and tongue and uvula move midline.  Gloria Anderson gait is still  very measured but not at all of balance, she can turn with 3 steps 180 degrees, she  does not have a drift to the left or right there was no propulsive tendency noted, she could rise from a seated position in a chair without bracing herself, there is  no tremor noted normal arm swing.  There is also no focal weakness, focal sensory loss and good coordination.   Handwriting and drawing do not reveal any trauma.   Cognitive testing as below was unremarkable.   The patient performed a Montreal cognitive assessment which is more difficult than the Mini-Mental Status Examination and mastered it well.    Fatigue was endorsed at 9 points, Epworth sleepiness test endorsed at 4 points, and there was only 1 out of 15 points endorsed on a geriatric depression scale.  I think we are on track is meeting every 6 months from now on.  After physical and neurologic examination, review of laboratory studies, imaging, neurophysiology testing and pre-existing records, assessment is :   PLAN you will stay on Klonopin, low dose 0.25 mg nightly. no insomnia and much less vivid dreams.  Her MRi documented : intracerebral  venous- cavernous malformation. We discussed possible seizure activity- sometimes in form of vivid dreams.   Sleep spells-  Trazodone added- this time sleep walking- she has been under stress, thinking about moving.   Continue with water aerobics and continue to travel.   I will add trazodone to the Klonopin- both drugs to control night terrors ,now sleep walking.   Caveat - Alcohol reduces the threshold to sleep walking.   RV every 6 month alternating NP and me- MOCA next time in may 2020.  Gloria Seat, MD     07-26-2017   Cc Dr Gloria Anderson , Dr Gloria Anderson.

## 2017-10-21 ENCOUNTER — Ambulatory Visit: Payer: Self-pay | Admitting: Neurology

## 2017-10-29 ENCOUNTER — Other Ambulatory Visit: Payer: Self-pay | Admitting: Rheumatology

## 2017-11-01 NOTE — Telephone Encounter (Signed)
Last visit: 10/06/17 Next visit: 03/23/18 Labs: 09/20/17 stable PLQ eye exam: 10/11/2017 normal  Okay to refill per Dr. Estanislado Pandy

## 2017-11-02 NOTE — Progress Notes (Signed)
I agree with the assessment and plan as directed by NP .The patient is known to me .   Kaelyn Nauta, MD  

## 2017-11-07 ENCOUNTER — Ambulatory Visit (INDEPENDENT_AMBULATORY_CARE_PROVIDER_SITE_OTHER): Payer: Medicare Other | Admitting: Podiatry

## 2017-11-07 DIAGNOSIS — L600 Ingrowing nail: Secondary | ICD-10-CM | POA: Diagnosis not present

## 2017-11-07 NOTE — Patient Instructions (Signed)
The day after your procedure:  Place 1/4 cup of epsom salts in a quart of warm tap water.  Submerge your foot or feet in the solution and soak for 20 minutes.  This soak should be done twice a day.  Next, remove your foot or feet from solution, blot dry the affected area. Apply ointment and cover if instructed by your doctor.   IF YOUR SKIN BECOMES IRRITATED WHILE USING THESE INSTRUCTIONS, IT IS OKAY TO SWITCH TO  WHITE VINEGAR AND WATER.  As another alternative soak, you may use antibacterial soap and water.  Monitor for any signs/symptoms of infection. Call the office immediately if any occur or go directly to the emergency room. Call with any questions/concerns.   Long Term Care Instructions-Post Nail Surgery  You have had your ingrown toenail and root treated with a chemical.  This chemical causes a burn that will drain and ooze like a blister.  This can drain for 6-8 weeks or longer.  It is important to keep this area clean, covered, and follow the soaking instructions dispensed at the time of your surgery.  This area will eventually dry and form a scab.  Once the scab forms you no longer need to soak or apply a dressing.  If at any time you experience an increase in pain, redness, swelling, or drainage, you should contact the office as soon as possible. 

## 2017-11-09 NOTE — Progress Notes (Signed)
   Subjective: Patient presents today for evaluation of pain to the lateral border of the left hallux. Patient is concerned for possible ingrown nail. Applying pressure to the toe increases the pain. She has not done anything for treatment. Patient presents today for further treatment and evaluation.  Past Medical History:  Diagnosis Date  . Arthritis   . Depression   . GERD (gastroesophageal reflux disease)   . Hypertension   . Hypothyroidism   . Osteoporosis   . Polymyalgia ([place])     Objective:  General: Well developed, nourished, in no acute distress, alert and oriented x3   Dermatology: Skin is warm, dry and supple bilateral. Lateral border of the left hallux appears to be erythematous with evidence of an ingrowing nail. Pain on palpation noted to the border of the nail fold. The remaining nails appear unremarkable at this time. There are no open sores, lesions.  Vascular: Dorsalis Pedis artery and Posterior Tibial artery pedal pulses palpable. No lower extremity edema noted.   Neruologic: Grossly intact via light touch bilateral.  Musculoskeletal: Muscular strength within normal limits in all groups bilateral. Normal range of motion noted to all pedal and ankle joints.   Assesement: #1 Paronychia with ingrowing nail lateral border left hallux  #2 Pain in toe #3 Incurvated nail  Plan of Care:  1. Patient evaluated.  2. Discussed treatment alternatives and plan of care. Explained nail avulsion procedure and post procedure course to patient. 3. Patient opted for permanent partial nail avulsion.  4. Prior to procedure, local anesthesia infiltration utilized using 3 ml of a 50:50 mixture of 2% plain lidocaine and 0.5% plain marcaine in a normal hallux block fashion and a betadine prep performed.  5. Partial permanent nail avulsion with chemical matrixectomy performed using [id number] applications of phenol followed by alcohol flush.  6. Light dressing applied. 7. Return to clinic  in 2 weeks.   Edrick Kins, DPM [facility]  Dr. Edrick Kins, [address]                                        [place], [place] [address]                Office [phone]  Fax [phone]

## 2017-11-21 ENCOUNTER — Ambulatory Visit: Payer: Medicare Other | Admitting: Podiatry

## 2017-11-23 ENCOUNTER — Ambulatory Visit: Payer: Medicare Other | Admitting: Podiatry

## 2017-11-28 ENCOUNTER — Telehealth: Payer: Self-pay | Admitting: Neurology

## 2017-11-28 NOTE — Telephone Encounter (Signed)
Pt request call back from RN. She has questions but did not want to leave them with me. Please call to advise

## 2017-11-28 NOTE — Telephone Encounter (Signed)
Rn spoke with Dr. Brett Fairy about pt having problems sleeping when taking klonopin, and trazodone together.  PEr Dr. Brett Fairy she stated patient needs to stop taking the trazodone, and just take the klonopin. Rn will call patient to give her Dr.Dohmeier recommendations.

## 2017-11-28 NOTE — Telephone Encounter (Signed)
Rn call patient about some concerns about her night time medications. PT stated DR Dohmeier told her she was sleep walking.Pt does not think she was sleep walking but Dr Brett Fairy is the expert. Also pt stated when she takes the klonopin, and trazodone together she is not getting enough sleep. When she was taking the klonopin alone she was getting good rest. Pt also stated she is having balance problems, but Dr. Brett Fairy is aware of that. Rn stated message will be sent to Dr. Brett Fairy.Pt verbalized understanding.

## 2017-11-28 NOTE — Telephone Encounter (Signed)
RN call patient to let her know that Dr.Dohmeier recommend she stop taking the trazodone. Rn stated Dr.Dohmeier recommend she take just the klonopin. The pt verbalized understanding.Marland Kitchen

## 2017-12-01 ENCOUNTER — Ambulatory Visit: Payer: Medicare Other | Admitting: Rheumatology

## 2017-12-05 ENCOUNTER — Telehealth: Payer: Self-pay | Admitting: Podiatry

## 2017-12-05 NOTE — Telephone Encounter (Signed)
I called pt and asked that she describe her toe she states the toe had been no problem, and does not have any pain, swelling, redness or drainage, the toe color looks normal, there is some small spots of black scabs only. I told pt that the scabing would gradually slough off as she bathed. Pt states she is going to [facility] for a shower party.

## 2017-12-05 NOTE — Telephone Encounter (Signed)
Pt wanted to tell you how her toe is looking. Shes not having any problems, just wondering if she really needs to come back in.

## 2017-12-12 ENCOUNTER — Ambulatory Visit: Payer: Medicare Other | Admitting: Podiatry

## 2017-12-24 ENCOUNTER — Other Ambulatory Visit: Payer: Self-pay | Admitting: Neurology

## 2017-12-24 DIAGNOSIS — F514 Sleep terrors [night terrors]: Secondary | ICD-10-CM

## 2017-12-26 ENCOUNTER — Ambulatory Visit (INDEPENDENT_AMBULATORY_CARE_PROVIDER_SITE_OTHER): Payer: Medicare Other | Admitting: Podiatry

## 2017-12-26 DIAGNOSIS — L600 Ingrowing nail: Secondary | ICD-10-CM | POA: Diagnosis not present

## 2017-12-26 NOTE — Progress Notes (Signed)
   HPI: 84 year old female presents the office today for reevaluation of a paronychia to the lateral border of the left hallux.  The patient had a partial permanent nail avulsion to the lateral border of the left hallux on 11/07/2017.  Patient states that after the procedure the area healed uneventfully.  Patient states that over the past week she noticed some erythema with increased drainage to the area.  She presents today for further treatment evaluation  Past Medical History:  Diagnosis Date  . Arthritis   . Depression   . GERD (gastroesophageal reflux disease)   . Hypertension   . Hypothyroidism   . Osteoporosis   . Polymyalgia [facility])      Physical Exam: General: The patient is alert and oriented x3 in no acute distress.  Dermatology: Localized erythema noted to the lateral border of the left hallux at the area of the nail avulsion site that was performed on 11/07/2017.  No malodor noted.  There is a minimal amount of serous drainage also noted.  Hyper granulation also noted to the area of the avulsion site.  Skin is warm, dry and supple bilateral lower extremities.   Vascular: Palpable pedal pulses bilaterally.  Capillary refill within normal limits.  Neurological: Epicritic and protective threshold grossly intact bilaterally.   Musculoskeletal Exam: Range of motion within normal limits to all pedal and ankle joints bilateral. Muscle strength 5/5 in all groups bilateral.   Assessment: 1.  Status post partial nail avulsion of the paronychia lateral border left hallux  Plan of Care:  1. Patient evaluated.   2.  Excisional debridement of the area was performed using a tissue nipper.  The nail avulsion site was explored using a small curette.  I was unable to identify any nail spicule or remaining nail that would be causing the erythema. 3.  Continue topical antibiotics and a Band-Aid x2 weeks. 4.  If the area does not improve over the next 2 weeks we may need to utilize local anesthetic  and further explore the nail avulsion site. 5.  Return to clinic in 2 weeks      Edrick Kins, DPM [facility]  Dr. Edrick Kins, DPM    [address], [place] [address]                Office [phone]  Fax [phone]

## 2017-12-27 ENCOUNTER — Other Ambulatory Visit: Payer: Self-pay | Admitting: *Deleted

## 2017-12-27 NOTE — Telephone Encounter (Signed)
Refill request received via fax for PLQ and fosamax from [facility]. Prescription was sent to the pharmacy fir a 90 day supply 11/01/17.

## 2018-01-02 ENCOUNTER — Ambulatory Visit: Payer: Medicare Other | Admitting: Podiatry

## 2018-01-11 ENCOUNTER — Ambulatory Visit (INDEPENDENT_AMBULATORY_CARE_PROVIDER_SITE_OTHER): Payer: Medicare Other | Admitting: Podiatry

## 2018-01-11 ENCOUNTER — Encounter: Payer: Self-pay | Admitting: Podiatry

## 2018-01-11 DIAGNOSIS — L600 Ingrowing nail: Secondary | ICD-10-CM

## 2018-01-15 NOTE — Progress Notes (Signed)
   HPI: 84 year old female presents to the office today for follow up evaluation of a paronychia to the lateral border of the left hallux. She denies any significant pain or modifying factors. She states the area looks weird but is doing well. Patient is here for further evaluation and treatment.   Past Medical History:  Diagnosis Date  . Arthritis   . Depression   . GERD (gastroesophageal reflux disease)   . Hypertension   . Hypothyroidism   . Osteoporosis   . Polymyalgia ([place])      Objective: Physical Exam General: The patient is alert and oriented x3 in no acute distress.  Dermatology: Skin is cool, dry and supple bilateral lower extremities. Negative for open lesions or macerations.  Vascular: Palpable pedal pulses bilaterally. No edema or erythema noted. Capillary refill within normal limits.  Neurological: Epicritic and protective threshold grossly intact bilaterally.   Musculoskeletal Exam: All pedal and ankle joints range of motion within normal limits bilateral. Muscle strength 5/5 in all groups bilateral.    Assessment: 1. Status post partial nail avulsion of the paronychia lateral border left hallux - healed   Plan of Care:  1. Patient evaluated.   2. Resume wearing good shoe gear.  3. Return to clinic as needed.     Edrick Kins, DPM [facility]  Dr. Edrick Kins, DPM    [address],  [address]                Office [phone]  Fax [phone]

## 2018-01-30 ENCOUNTER — Ambulatory Visit: Payer: Medicare Other | Admitting: Neurology

## 2018-01-30 ENCOUNTER — Encounter

## 2018-01-30 ENCOUNTER — Encounter: Payer: Self-pay | Admitting: Neurology

## 2018-01-30 VITALS — Ht 64.0 in | Wt 120.0 lb

## 2018-01-30 DIAGNOSIS — F513 Sleepwalking [somnambulism]: Secondary | ICD-10-CM | POA: Diagnosis not present

## 2018-01-30 DIAGNOSIS — G4759 Other parasomnia: Secondary | ICD-10-CM

## 2018-01-30 DIAGNOSIS — M48061 Spinal stenosis, lumbar region without neurogenic claudication: Secondary | ICD-10-CM | POA: Diagnosis not present

## 2018-01-30 DIAGNOSIS — R269 Unspecified abnormalities of gait and mobility: Secondary | ICD-10-CM

## 2018-01-30 DIAGNOSIS — R351 Nocturia: Secondary | ICD-10-CM

## 2018-01-30 NOTE — Progress Notes (Signed)
[facility]  Provider:  Larey Seat, MD    Referring Provider: [name], MD Primary Care Physician:  [name], MD  Chief Complaint  Patient presents with  . Follow-up    RM 1, alone.     HPI:   This patient of Dr. 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 She tolerates Klonopin and is happy with the REM sleep control. She has developed higher blood pressures spurs of higher blood pressures that Dr. Forde Dandy and Dr. Einar Gip have followed, and initiated treatment- she developed orthostatic BP!Marland Kitchen She was found to be anemic, sleepy and fatigued. Has normal B 12 and started oral iron. She has a history of PMR. Polymyalgia rheumatica. She had a normal colonoscopy in 2016. Had lost weight , but recovered . Gloria Anderson brought her last laboratory results with her, white blood cell count was 3.74K, red blood cell count was 2.9, hemoglobin 9.8 g, hematocrit 28.4%, corpuscular volume 97.1 MCH 33.5. She was over the phone prescribed Nu-iron to take as a supplement. Needs TIBC, Total iron deficiency,   09-26-13- The patient's sleep study from 08-05-13 revealed no apnea, no oxygen desaturation, no irregular heart beats, and only took clusters of limb movements that seem to be related to discomfort with sleeping on the left side. She had no periodic limb movements during REM sleep. There was a prolonged period of slow-wave sleep at around 2 AM during which nor night terrors sleep walking or sleep talking was ordered. The patient reported  that she had another spell- a feeling as if something brushes up on her scalp, or behind her head.  Since the patient has a long-standing bruit that she can hear at night and that bothers her( tinnitus) I have also suggested to do an MRA of the brain , as I am concerned that they're tortuous vessels , which  may  be affecting the left frontal lobe perfusion. This could be a trigger for a nocturnal event as described by the patient clinically. Her MRi documented to my surprise exactly that ! An intracerebral  Venous- cavernous malformation. We are now meeting on 09-26-13 to discuss the results and documented the findings. She had more Night terrors the last weeks. The gabapentin gave her diplopia. I suggest now Topiramate, but she reminded me of involuntary weight loss and loss of apetite. I will try a very low dose of KLONOPIN.  The patient should watch her BP and avoid straining , pressure inducing activities.   RV 02-27-14  Discussed EEG results, no epileptiform activity , but frontal sharps noted. These are of unclear significance.  See report. No change in meds.needed  Patient is free to travel to [place] in April.  The patient,  a caucasian right handed female , originally from [place], reports vivid dreams, with fearful frightful content.  Her dreams have let her to act out some of the dream content, she wakes with her arms extended and with palpitations, tachycardia form fright. She kicks and fell out of bed, yelling, calling out . Following  one dream last year,  she injured her face in a fall . She begun covering all sharp edges.  She has polymyalgia rheumatica and was treated with prednisone , gaining weight through this time She goes to bed around 11.15 Pm, after watching TV in her living room. Falls asleep promptly , and wakes at 12.15 hearing a  noise , that seemed to arise form behind her headboard- the noise goes away once she is awake. She could fall asleep again. Her acting out of dreams arises at 4 AM or later in the last stages of sleep and gets more frequently over the last year.  She rises at 8.30, the alarm wakes her at 8.  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 No childhood sleep disorder history. Usually a 7 -8 hour sleeper , refreshed.  The patient is widowed for 12 years, has adult children and  grandchildren.The bruit in her left temple keeps her form sleeping on the side. She has 3 carotid dopplers over 25 years, diagnosed with  tortious vessel.  MRI brain was normal 4 year ago ( Dr. Carloyn Manner)  No MRA was done at the time , I like to rule out a vascular abnormality at the frontal lobe.  08-04-2015 Her night terrors have responded to the medication, and her spells are rare now. She returned from [place] and had one event which "scared her sister to death "  I have spoken to Dr. Delice Lesch about the possible component of cavernous vascular malformations producing a seizure event. I ordered a 48 hour study but the patient underwent a 72 hour study. This was normal- no seizure activity,  She is doing well on Klonopin. She gained weight on klonopin, (a fact she likes!) She reports vertigo, dizzines with rapid head movementns and bending down, also controlled with Klonopin.  Her eye feel as if pulsating - can this be the AVM? HTN was addressed in the last 2 visits with endocrinology / PCP.  She joked today that her weight has been stabile for so long, she had no excuse for a new wardrobe !  She had no spells for the last 4 month. She needs no refills today.  Interval history from 02/03/2016. Gloria Anderson reports today that after being almost free of vivid dreams for several month she has returned having some not to the same intensity and not the same frequency as prior to treatment. Some of them are scary they have the character of the night may or or night terror, and she responds was higher heart rate feeling under stress. Klonopin has been working very well at a very low dose for her by now for over 2 years. I would like to continue using Klonopin with the option that if the medication wears off she could increase the dose. She also does not have insomnia when taking Klonopin. She is not fatigued and she is not excessively daytime sleepy, she also does not report that she is groggy in the mornings. She  feels stable well-balanced and not at a risk of falling. I will refill her medication today. I also congratulated her to her 82th birthday, which she celebrates in style this coming Saturday.  Travel history from 15 February 2017, Gloria Anderson has traveled to summer to [place] with some difficulties.  She had no medical problems however.  She brought me her Klonopin prescription but she had not needed to fill and which is now a year old.  I will [facility]ill her [facility] today. She has no complain of grogginess or mental cloudiness in the morning. She has night terrors. She has noted that she is not easily scared , not panicked.   07-26-2017, Gloria Anderson is a 84 y.o. female patient for follow up on concerns of cognitive function, memory.  She seems well, is alert and happy. She has a  full social life.  She is relieved that the [facility] was normal 26-30 points. Balance is reportedly poor, she is careful.  She continues to report hip pain, and has some night spells- but much better than before Klonopin was initiated.  Was seen by Dr. Einar Gip cardiology for BP variability- he asked  Her to start wearing compression stockings and elevate her feet at night. She has not always hydrated well, and she is struggling with HTN and orthostatic lightheadedness.   Today 10/05/17:  Gloria Anderson is an 84 year old female with a history of memory disturbance and night terrors.  She returns today to discuss her balance.  She states that she went to see her primary care after she had an episode while ambulating.  She reports that she was walking with a neighbor.  15 minutes in to her walk she lost sensation in the lower part of her back and in her ankles.  She reports that she felt like she had no control over her balance.  She states that she had to sit down.  She reports after that event she had some mild episodes but since then this has resolved and she has not had any trouble walking.  She states that  she went for a walk last night and did not have any symptoms.  She states that she has found if she uses a cane this offers her more stability.  In the past CT of the lumbar spine as well as MRI of the lumbar spine shows stenosis.  She denies any changes with her bowels or bladder.  Denies any numbness or tingling in the lower extremities.  She returns today for an evaluation.  HISTORY Gloria Anderson is a 84 y.o. female patient for follow up on concerns of cognitive function, memory.  She seems well, is alert and happy. She has a full social life.  She is relieved that the [facility] was normal 26-30 points. Balance is reportedly poor, she is careful.  She continues to report hip pain, and has some night spells- but much better than before Klonopin was initiated.  Was seen by Dr. Einar Gip cardiology for BP variability- he asked  Her to start wearing compression stockings and elevate her feet at night. She has not always hydrated well, and she is struggling with HTN and orthostatic lightheadedness.   10-20-2017, Gloria Anderson, who is  84 years old reports a sleep walking episode that shook her- she dreamt about a scary situation, left her bed , walked about and finally woke up - realized she had dreamt and is not concerned about repeating this episodes with a risk of injury.  She started on Hydralazine as a new BP medication- could this have contributed/ she has been well controlled on Klonopin, and we will add trazodone tonight.   Interval history from 30 January 2018, I have the pleasure of meeting today with Gloria Anderson, this is not an emergency visit but a visit we had on our books for a while.  The patient performed today and a Montreal cognitive assessment and she scored 27 out of 30 points which is an entirely normal result.  Especially her short-term recall is excellent.  She had no trouble with naming objects and doing mathematics or arithmetic.  She had normal attention she has some trouble  with visual-spatial crest such as drawing or during the Trail Making Test.  This is unchanged to previous results her Epworth sleepiness score was endorsed at 10 and her fatigue severity score  was not endorsed.  Her medication list is available, she is using Klonopin 0.5 mg half tablet by mouth at bedtime. Sleep is fragmented but she feels its related to bathroom breaks. She reports nocturia 5-6 times, and she drinks water all day. No burning and no foul smelling urine.      Review of Systems: Out of a complete 14 system review, the patient complains of only the following symptoms, and all other reviewed systems are negative. Acting out dreams, yelling but not leaving the bed. Sleeps 7- 8 hours, nocturia.   Sleep walking- no repeat since last visit 01-30-2018 .   Social History   Socioeconomic History  . Marital status: Widowed    Spouse name: Not on file  . Number of children: 2  . Years of education: HS  . Highest education level: Not on file  Occupational History  . Occupation: RETIRED    Employer: [employer]  . Financial resource strain: Not on file  . Food insecurity:    Worry: Not on file    Inability: Not on file  . Transportation needs:    Medical: Not on file    Non-medical: Not on file  Tobacco Use  . Smoking status: Never Smoker  . Smokeless tobacco: Never Used  Substance and Sexual Activity  . Alcohol use: No    Alcohol/week: 0.0 standard drinks  . Drug use: Never  . Sexual activity: Not on file  Lifestyle  . Physical activity:    Days per week: Not on file    Minutes per session: Not on file  . Stress: Not on file  Relationships  . Social connections:    Talks on phone: Not on file    Gets together: Not on file    Attends religious service: Not on file    Active member of club or organization: Not on file    Attends meetings of clubs or organizations: Not on file    Relationship status: Not on file  . Intimate partner violence:    Fear  of current or ex partner: Not on file    Emotionally abused: Not on file    Physically abused: Not on file    Forced sexual activity: Not on file  Other Topics Concern  . Not on file  Social History Narrative   Patient is widowed.   Patient has two children.   Patient does not drink any caffeine.    Patient has a high school education.   Patient is right-handed.             Family History  Problem Relation Age of Onset  . CVA Father   . Tuberculosis Mother   . Lung cancer Brother     Past Medical History:  Diagnosis Date  . Arthritis   . Depression   . GERD (gastroesophageal reflux disease)   . Hypertension   . Hypothyroidism   . Osteoporosis   . Polymyalgia ([place])     Past Surgical History:  Procedure Laterality Date  . COLONOSCOPY W/ POLYPECTOMY    . EYE SURGERY     both cataracts  . NASAL SINUS SURGERY    . SHOULDER ARTHROSCOPY  2007   rt   . TONSILLECTOMY    . TRIGGER FINGER RELEASE  03/19/2011   Procedure: RELEASE TRIGGER FINGER/A-1 PULLEY;  Surgeon: Cammie Sickle., MD;  Location: [facility];  Service: Orthopedics;  Laterality: Right;  Procedure:  Release Right Long and Ring  Trigger Fingers, Release Left Long Trigger Finger, Injection Left Long Proximal Phalangeal Joint    Current Outpatient Medications  Medication Sig Dispense Refill  . alendronate (FOSAMAX) 70 MG tablet TAKE 1 TABLET BY MOUTH ONCE A WEEK WITH A FULL GLASS OF WATER ON AN EMPTY STOMACH 12 tablet 0  . antiseptic oral rinse (BIOTENE) LIQD 15 mLs by Mouth Rinse route as needed for dry mouth.    Marland Kitchen atorvastatin (LIPITOR) 10 MG tablet     . BIOTIN FORTE PO Take by mouth.    Marland Kitchen BIOTIN FORTE PO Take by mouth. Takes one daily.    . Calcium Citrate (CITRACAL PO) Take 600 mg by mouth.     . carvedilol (COREG) 3.125 MG tablet 1 tablet 2 (two) times daily with a meal.     . clonazePAM (KLONOPIN) 0.5 MG tablet TAKE ONE-HALF TABLET BY  MOUTH AT BEDTIME 45 tablet 1  . Docusate Calcium  (STOOL SOFTENER PO) Take 1 tablet by mouth daily. 200 mg    . fish oil-omega-3 fatty acids 1000 MG capsule Take 2 g by mouth daily.    . Garlic 3557 MG TBEC Take 1 tablet by mouth daily.    . hydrALAZINE (APRESOLINE) 25 MG tablet     . hydroxychloroquine (PLAQUENIL) 200 MG tablet TAKE 1 TABLET BY MOUTH  DAILY 90 tablet 0  . Iron-Vitamins (GERITOL PO) Take by mouth.    . levothyroxine (SYNTHROID, LEVOTHROID) 50 MCG tablet Take 50 mcg by mouth daily.    Marland Kitchen omeprazole (PRILOSEC) 20 MG capsule Take 20 mg by mouth daily.    . polyethylene glycol (MIRALAX / GLYCOLAX) packet Take 17 g by mouth daily as needed.    . Probiotic Product (PROBIOTIC DAILY PO) Take 1 tablet by mouth daily.     No current facility-administered medications for this visit.     Allergies as of 01/30/2018  . (No Known Allergies)    Vitals: Ht 5\' 4"  (1.626 m)   Wt 120 lb (54.4 kg)   BMI 20.60 kg/m  Last Weight:  Wt Readings from Last 1 Encounters:  01/30/18 120 lb (54.4 kg)   Last Height:   Ht Readings from Last 1 Encounters:  01/30/18 5\' 4"  (1.626 m)    Physical exam:  General: The patient is awake, alert and appears not in acute distress. The patient is well groomed. Head: Normocephalic, atraumatic. Neck is supple. Mallampati 3 , retrognathia , neck circumference:14. 25". inches.  Cardiovascular:  Regular rate and rhythm , without  murmurs , has a left sided carotid bruit, no distended neck veins. Respiratory: Lungs are clear to auscultation. Skin:  Without evidence of edema, or rash Trunk:  normal posture.  Neurologic exam : [facility] Cognitive Assessment  01/30/2018 07/26/2017 07/26/2017 02/03/2015  Visuospatial/ Executive (0/5) 3 3 3 4   Naming (0/3) 3 3 3 3   Attention: Read list of digits (0/2) 2 2 2 2   Attention: Read list of letters (0/1) 1 1 1 1   Attention: Serial 7 subtraction starting at 100 (0/3) 3 3 3 3   Language: Repeat phrase (0/2) 2 1 1 2   Language : Fluency (0/1) 0 0 0 1  Abstraction (0/2) 2 2  2 2   Delayed Recall (0/5) 5 5 5 3   Orientation (0/6) 6 6 6 6   Total 27 26 26 27   Adjusted Score (based on education) - - - 27    The patient is awake and alert, oriented to place and time.  Memory see above; [place] 26-30,  There is a normal attention span & concentration ability. Speech is fluent with mild hoarseness  .Mood and affect are appropriate. Cranial nerves: Pupils are equal and briskly reactive to light. Hearing to finger rub intact.   Facial sensation intact to fine touch. Facial motor strength is symmetric and tongue and uvula move midline.  Gloria Anderson gait is still  very measured but not at all of balance, she can turn with 3 steps 180 degrees, she does not have a drift to the left or right there was no propulsive tendency noted, she could rise from a seated position in a chair without bracing herself, there is no tremor noted normal arm swing.  There is also no focal weakness, focal sensory loss and good coordination.  Handwriting and drawing do not reveal any changes.  Cognitive testing as below was unremarkable.  The patient performed a Montreal cognitive assessment again- which is more difficult than the Mini-Mental Status Examination- and mastered it again well.   The patient reported she hasn't felt well in a while, she has been impatient. She has back pain, had a CT scan ordered by dr Forde Dandy.  L3-4 and L 4-5 severe stenosis and likely foraminal stenosis as well.  Buttocks feel numb - claudication with need to rest. Referral to Dr. Ellene Route, [facility]      After physical and neurologic examination, review of laboratory studies, imaging, neurophysiology testing and pre-existing records, assessment is :   PLAN  stay on Klonopin, low dose 0.25 mg nightly. no insomnia and much less vivid dreams.  Her MRi documented : intracerebral  venous- cavernous malformation. We discussed possible seizure activity- sometimes in form of vivid dreams.   Sleep spells- Trazodone added-  this time sleep walking- she has been under stress, thinking about moving.  I don't see a correlation to medication, she is not likely to snore or have sleep apnea.   Continue with water aerobics and continue to travel.  I will refer to neurosurgery for her lumbar stenosis evaluation.   I will add Trazodone to the Klonopin- both drugs to control night terrors ,now sleep walking.  Caveat - Alcohol reduces the threshold to sleep walking.  RV every 12 month alternating NP and me- [facility] next time in May 2020.  Larey Seat, MD     01-30-2018   Cc Dr Forde Dandy ,  Dr Einar Gip.

## 2018-02-07 ENCOUNTER — Other Ambulatory Visit: Payer: Self-pay | Admitting: Rheumatology

## 2018-02-07 NOTE — Telephone Encounter (Signed)
Last visit: 10/06/17 Next visit: 03/23/18 Labs: 09/20/17 stable  Okay to refill per Dr. Estanislado Pandy

## 2018-02-11 ENCOUNTER — Other Ambulatory Visit: Payer: Self-pay | Admitting: Rheumatology

## 2018-02-13 NOTE — Telephone Encounter (Signed)
Last Visit: 10/06/17 Next Visit: 03/23/18 Labs: 09/20/17 stable Eye exam: 10/11/2017 normal.  Okay to refill per Dr. Estanislado Pandy

## 2018-03-10 NOTE — Progress Notes (Signed)
Office Visit Note  Patient: Gloria Anderson             Date of Birth: May 13, 1933           MRN: [id number]             PCP: [name], MD Referring: [name], MD Visit Date: 03/23/2018 Occupation: @GUAROCC @  Subjective:  Lower back pain.   History of Present Illness: Gloria Anderson is a 85 y.o. female with history of degenerative disc disease, osteoarthritis and polymyalgia rheumatica.  She has not had any increased muscle weakness or tenderness.  She has been taking Plaquenil.  She continues to have some discomfort in her hands and feet due to underlying osteoarthritis.  She has a bothersome left fourth trigger finger.  She continues to have a lot of pain and discomfort in her lower back from degenerative disc disease and spinal stenosis.  She has an appointment coming up with Dr. Vertell Limber next month.  She has been experiencing pain over bilateral ischial bursa as well.  She states using a cushion is helpful.  Activities of Daily Living:  Patient reports morning stiffness for 5 minutes.   Patient Denies nocturnal pain.  Difficulty dressing/grooming: Denies Difficulty climbing stairs: Denies Difficulty getting out of chair: Reports Difficulty using hands for taps, buttons, cutlery, and/or writing: Denies  Review of Systems  Constitutional: Negative for fatigue, night sweats, weight gain and weight loss.  HENT: Positive for mouth dryness. Negative for mouth sores, trouble swallowing, trouble swallowing and nose dryness.   Eyes: Positive for dryness. Negative for pain, redness and visual disturbance.  Respiratory: Negative for cough, shortness of breath and difficulty breathing.   Cardiovascular: Negative for chest pain, palpitations, hypertension, irregular heartbeat and swelling in legs/feet.  Gastrointestinal: Negative for blood in stool, constipation and diarrhea.  Endocrine: Negative for increased urination.  Genitourinary: Negative for vaginal dryness.    Musculoskeletal: Positive for arthralgias and joint pain. Negative for joint swelling, myalgias, muscle weakness, morning stiffness, muscle tenderness and myalgias.  Skin: Negative for color change, rash, hair loss, skin tightness, ulcers and sensitivity to sunlight.  Allergic/Immunologic: Negative for susceptible to infections.  Neurological: Negative for dizziness, memory loss, night sweats and weakness.  Hematological: Negative for swollen glands.  Psychiatric/Behavioral: Positive for depressed mood and sleep disturbance. The patient is not nervous/anxious.     PMFS History:  Patient Active Problem List   Diagnosis Date Noted  . Memory difficulty 07/26/2017  . Other parasomnia 07/26/2017  . Anemia 06/02/2017  . Osteopenia 06/02/2017  . Spinal stenosis 06/02/2017  . Iron deficiency anemia secondary to inadequate dietary iron intake 08/05/2016  . Fatigue associated with anemia 08/05/2016  . Primary osteoarthritis of both feet 02/04/2016  . Essential hypertension 02/04/2016  . PMR (polymyalgia rheumatica) (HCC) 02/03/2016  . Osteoarthritis, hand 02/03/2016  . Age-related osteoporosis without current pathological fracture 02/03/2016  . DDD (degenerative disc disease), lumbar 02/03/2016  . High risk medication use 02/03/2016  . 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 Cerebral cavernous malformation type 1 12/04/2013  . Night terrors, adult 09/26/2013  . HEMORRHOIDS-EXTERNAL 09/19/2009  . GERD 09/19/2009  . CONSTIPATION 09/19/2009  . DYSPHAGIA 09/19/2009  . PERSONAL HISTORY OF COLONIC POLYPS 09/19/2009    Past Medical History:  Diagnosis Date  . Arthritis   . Depression   . GERD (gastroesophageal reflux disease)   . Hypertension   . Hypothyroidism   . Osteoporosis   . Polymyalgia ([place])  Family History  Problem Relation Age of Onset  . CVA Father   . Tuberculosis Mother   . Lung cancer Brother    Past Surgical History:   Procedure Laterality Date  . COLONOSCOPY W/ POLYPECTOMY    . EYE SURGERY     both cataracts  . NASAL SINUS SURGERY    . SHOULDER ARTHROSCOPY  2007   rt   . TONSILLECTOMY    . TRIGGER FINGER RELEASE  03/19/2011   Procedure: RELEASE TRIGGER FINGER/A-1 PULLEY;  Surgeon: Cammie Sickle., MD;  Location: [facility];  Service: Orthopedics;  Laterality: Right;  Procedure:  Release Right Long and Ring Trigger Fingers, Release Left Long Trigger Finger, Injection Left Long Proximal Phalangeal Joint   Social History   Social History Narrative   Patient is widowed.   Patient has two children.   Patient does not drink any caffeine.    Patient has a high school education.   Patient is right-handed.            Immunization History  Administered Date(s) Administered  . Influenza, High Dose Seasonal PF 12/19/2016  . Zoster Recombinat (Shingrix) 08/01/2017, 10/10/2017     Objective: Vital Signs: BP (!) 166/80 (BP Location: Left Arm, Patient Position: Sitting, Cuff Size: Normal)   Pulse 74   Resp 13   Ht 5\' 4"  (1.626 m)   Wt 122 lb (55.3 kg)   BMI 20.94 kg/m    Physical Exam Vitals signs and nursing note reviewed.  Constitutional:      Appearance: She is well-developed.  HENT:     Head: Normocephalic and atraumatic.  Eyes:     Conjunctiva/sclera: Conjunctivae normal.  Neck:     Musculoskeletal: Normal range of motion.  Cardiovascular:     Rate and Rhythm: Normal rate and regular rhythm.     Heart sounds: Normal heart sounds.  Pulmonary:     Effort: Pulmonary effort is normal.     Breath sounds: Normal breath sounds.  Abdominal:     General: Bowel sounds are normal.     Palpations: Abdomen is soft.  Lymphadenopathy:     Cervical: No cervical adenopathy.  Skin:    General: Skin is warm and dry.     Capillary Refill: Capillary refill takes less than 2 seconds.  Neurological:     Mental Status: She is alert and oriented to person, place, and time.   Psychiatric:        Behavior: Behavior normal.      Musculoskeletal Exam: C-spine good range of motion.  She has painful limited range of motion of lumbar spine.  Shoulder joints were in good range of motion.  She had discomfort range of motion of her left shoulder.  Elbow joints wrist joints with good range of motion.  She has DIP and PIP thickening in her hands and feet.  She had tenderness on palpation of her bilateral shoulders.  She had left fourth trigger finger.  CDAI Exam: CDAI Score: Not documented Patient Global Assessment: Not documented; Provider Global Assessment: Not documented Swollen: Not documented; Tender: Not documented Joint Exam   Not documented   There is currently no information documented on the homunculus. Go to the Rheumatology activity and complete the homunculus joint exam.  Investigation: No additional findings.  Imaging: No results found.  Recent Labs: Lab Results  Component Value Date   WBC 5.8 09/20/2017   HGB 11.6 (L) 09/20/2017   PLT 201 09/20/2017   NA 139 09/20/2017  K 4.4 09/20/2017   CL 103 09/20/2017   CO2 26 09/20/2017   GLUCOSE 101 (H) 09/20/2017   BUN 23 09/20/2017   CREATININE 1.13 (H) 09/20/2017   BILITOT 0.9 09/20/2017   AST 26 09/20/2017   ALT 16 09/20/2017   PROT 6.9 09/20/2017   CALCIUM 9.4 09/20/2017   GFRAA 53 (L) 09/20/2017    Speciality Comments: PLQ eye exam: 10/11/2017 normal. Dr. Gershon Crane. Follow up in 1 year.  Procedures:  No procedures performed Allergies: Patient has no known allergies.   Assessment / Plan:     Visit Diagnoses: PMR (polymyalgia rheumatica) (HCC) -  in remission.  Patient has no muscle weakness or tenderness on examination.  She had flare of her symptoms when she came off Plaquenil in the past.  We will continue Plaquenil.  High risk medication use - PLQ. eye exam: 10/11/2017 - Plan: CBC with Differential/Platelet, COMPLETE METABOLIC PANEL WITH GFR  Acute pain of left shoulder-she has some  crepitus with range of motion of her left shoulder.  I have given her left shoulder joint exercises.  Primary osteoarthritis of both hands= joint protection muscle strengthening was discussed.  She has DIP subluxation.  I discussed during this point.  At this point she would like to hold off.  Trigger ring finger of left hand-I offered referral to hand surgery.  She states she will notify me.  She had prior surgery on her right hand for trigger finger.  Primary osteoarthritis of both feet-pain is manageable.  Ischial bursitis, unspecified laterality-she continues to have discomfort in bilateral ischial bursa.  I have advised her to sit on a cushion.  I offered physical therapy but she would like to hold off.  DDD (degenerative disc disease), lumbar with severe spinal stenosis.  She has been experiencing lower back pain with radiculopathy and some numbness in her gluteal region.  She has appointment coming up with neurosurgery.  We reviewed her CT scan and discussed that at length.  She complains of poor balance.  Balance exercises were also demonstrated and discussed in the office.  Age-related osteoporosis without current pathological fracture - T-score is minus 2.8, January 2017/ Fosamax 70 mg by mouth q week.  She will discuss repeat DEXA with her PCP.  History of gastroesophageal reflux (GERD)  History of anemia  History of hypertension-her blood pressure is a still elevated.  Night terrors, adult   Orders: Orders Placed This Encounter  Procedures  . CBC with Differential/Platelet  . COMPLETE METABOLIC PANEL WITH GFR   No orders of the defined types were placed in this encounter.   Face-to-face time spent with patient was 40 minutes. Greater than 50% of time was spent in counseling and coordination of care.  Follow-Up Instructions: Return in about 5 months (around 08/22/2018) for Osteoarthritis,DDD, PMR.   Bo Merino, MD  Note - This record has been created using Radio producer.  Chart creation errors have been sought, but may not always  have been located. Such creation errors do not reflect on  the standard of medical care.

## 2018-03-23 ENCOUNTER — Ambulatory Visit: Payer: Medicare Other | Admitting: Rheumatology

## 2018-03-23 ENCOUNTER — Encounter: Payer: Self-pay | Admitting: Rheumatology

## 2018-03-23 VITALS — BP 166/80 | HR 74 | Resp 13 | Ht 64.0 in | Wt 122.0 lb

## 2018-03-23 DIAGNOSIS — M65342 Trigger finger, left ring finger: Secondary | ICD-10-CM | POA: Diagnosis not present

## 2018-03-23 DIAGNOSIS — M353 Polymyalgia rheumatica: Secondary | ICD-10-CM | POA: Diagnosis not present

## 2018-03-23 DIAGNOSIS — Z862 Personal history of diseases of the blood and blood-forming organs and certain disorders involving the immune mechanism: Secondary | ICD-10-CM

## 2018-03-23 DIAGNOSIS — M25512 Pain in left shoulder: Secondary | ICD-10-CM

## 2018-03-23 DIAGNOSIS — M5136 Other intervertebral disc degeneration, lumbar region: Secondary | ICD-10-CM

## 2018-03-23 DIAGNOSIS — M810 Age-related osteoporosis without current pathological fracture: Secondary | ICD-10-CM

## 2018-03-23 DIAGNOSIS — Z79899 Other long term (current) drug therapy: Secondary | ICD-10-CM | POA: Diagnosis not present

## 2018-03-23 DIAGNOSIS — M19071 Primary osteoarthritis, right ankle and foot: Secondary | ICD-10-CM

## 2018-03-23 DIAGNOSIS — M19072 Primary osteoarthritis, left ankle and foot: Secondary | ICD-10-CM

## 2018-03-23 DIAGNOSIS — M19041 Primary osteoarthritis, right hand: Secondary | ICD-10-CM

## 2018-03-23 DIAGNOSIS — Z8719 Personal history of other diseases of the digestive system: Secondary | ICD-10-CM

## 2018-03-23 DIAGNOSIS — M7070 Other bursitis of hip, unspecified hip: Secondary | ICD-10-CM

## 2018-03-23 DIAGNOSIS — Z8679 Personal history of other diseases of the circulatory system: Secondary | ICD-10-CM

## 2018-03-23 DIAGNOSIS — M19042 Primary osteoarthritis, left hand: Secondary | ICD-10-CM

## 2018-03-23 DIAGNOSIS — F514 Sleep terrors [night terrors]: Secondary | ICD-10-CM

## 2018-03-23 LAB — CBC WITH DIFFERENTIAL/PLATELET
Absolute Monocytes: 484 cells/uL (ref 200–950)
Basophils Absolute: 21 cells/uL (ref 0–200)
Basophils Relative: 0.4 %
Eosinophils Absolute: 120 cells/uL (ref 15–500)
Eosinophils Relative: 2.3 %
HCT: 34 % — ABNORMAL LOW (ref 35.0–45.0)
Hemoglobin: 11.4 g/dL — ABNORMAL LOW (ref 11.7–15.5)
Lymphs Abs: 1076 cells/uL (ref 850–3900)
MCH: 32.5 pg (ref 27.0–33.0)
MCHC: 33.5 g/dL (ref 32.0–36.0)
MCV: 96.9 fL (ref 80.0–100.0)
MPV: 10.2 fL (ref 7.5–12.5)
Monocytes Relative: 9.3 %
Neutro Abs: 3500 cells/uL (ref 1500–7800)
Neutrophils Relative %: 67.3 %
Platelets: 182 10*3/uL (ref 140–400)
RBC: 3.51 10*6/uL — ABNORMAL LOW (ref 3.80–5.10)
RDW: 11.9 % (ref 11.0–15.0)
Total Lymphocyte: 20.7 %
WBC: 5.2 10*3/uL (ref 3.8–10.8)

## 2018-03-23 LAB — COMPLETE METABOLIC PANEL WITH GFR
AG Ratio: 1.6 (calc) (ref 1.0–2.5)
ALT: 28 U/L (ref 6–29)
AST: 34 U/L (ref 10–35)
Albumin: 4.2 g/dL (ref 3.6–5.1)
Alkaline phosphatase (APISO): 45 U/L (ref 33–130)
BUN/Creatinine Ratio: 21 (calc) (ref 6–22)
BUN: 21 mg/dL (ref 7–25)
CO2: 30 mmol/L (ref 20–32)
Calcium: 9.9 mg/dL (ref 8.6–10.4)
Chloride: 101 mmol/L (ref 98–110)
Creat: 1.02 mg/dL — ABNORMAL HIGH (ref 0.60–0.88)
GFR, Est African American: 59 mL/min/{1.73_m2} — ABNORMAL LOW (ref >=60)
GFR, Est Non African American: 51 mL/min/{1.73_m2} — ABNORMAL LOW (ref >=60)
Globulin: 2.6 g/dL (calc) (ref 1.9–3.7)
Glucose, Bld: 92 mg/dL (ref 65–99)
Potassium: 4.5 mmol/L (ref 3.5–5.3)
Sodium: 140 mmol/L (ref 135–146)
Total Bilirubin: 0.9 mg/dL (ref 0.2–1.2)
Total Protein: 6.8 g/dL (ref 6.1–8.1)

## 2018-03-23 NOTE — Patient Instructions (Signed)
Shoulder Exercises Ask your health care provider which exercises are safe for you. Do exercises exactly as told by your health care provider and adjust them as directed. It is normal to feel mild stretching, pulling, tightness, or discomfort as you do these exercises, but you should stop right away if you feel sudden pain or your pain gets worse.Do not begin these exercises until told by your health care provider. Range of Motion Exercises        These exercises warm up your muscles and joints and improve the movement and flexibility of your shoulder. These exercises also help to relieve pain, numbness, and tingling. These exercises involve stretching your injured shoulder directly. Exercise A: Pendulum 1. Stand near a wall or a surface that you can hold onto for balance. 2. Bend at the waist and let your left / right arm hang straight down. Use your other arm to support you. Keep your back straight and do not lock your knees. 3. Relax your left / right arm and shoulder muscles, and move your hips and your trunk so your left / right arm swings freely. Your arm should swing because of the motion of your body, not because you are using your arm or shoulder muscles. 4. Keep moving your body so your arm swings in the following directions, as told by your health care provider: ? Side to side. ? Forward and backward. ? In clockwise and counterclockwise circles. 5. Continue each motion for __________ seconds, or for as long as told by your health care provider. 6. Slowly return to the starting position. Repeat __________ times. Complete this exercise __________ times a day. Exercise B:Flexion, Standing 1. Stand and hold a broomstick, a cane, or a similar object. Place your hands a little more than shoulder-width apart on the object. Your left / right hand should be palm-up, and your other hand should be palm-down. 2. Keep your elbow straight and keep your shoulder muscles relaxed. Push the stick  down with your healthy arm to raise your left / right arm in front of your body, and then over your head until you feel a stretch in your shoulder. ? Avoid shrugging your shoulder while you raise your arm. Keep your shoulder blade tucked down toward the middle of your back. 3. Hold for __________ seconds. 4. Slowly return to the starting position. Repeat __________ times. Complete this exercise __________ times a day. Exercise C: Abduction, Standing 1. Stand and hold a broomstick, a cane, or a similar object. Place your hands a little more than shoulder-width apart on the object. Your left / right hand should be palm-up, and your other hand should be palm-down. 2. While keeping your elbow straight and your shoulder muscles relaxed, push the stick across your body toward your left / right side. Raise your left / right arm to the side of your body and then over your head until you feel a stretch in your shoulder. ? Do not raise your arm above shoulder height, unless your health care provider tells you to do that. ? Avoid shrugging your shoulder while you raise your arm. Keep your shoulder blade tucked down toward the middle of your back. 3. Hold for __________ seconds. 4. Slowly return to the starting position. Repeat __________ times. Complete this exercise __________ times a day. Exercise D:Internal Rotation 1. Place your left / right hand behind your back, palm-up. 2. Use your other hand to dangle an exercise band, a towel, or a similar object over your shoulder.   Grasp the band with your left / right hand so you are holding onto both ends. 3. Gently pull up on the band until you feel a stretch in the front of your left / right shoulder. ? Avoid shrugging your shoulder while you raise your arm. Keep your shoulder blade tucked down toward the middle of your back. 4. Hold for __________ seconds. 5. Release the stretch by letting go of the band and lowering your hands. Repeat __________ times.  Complete this exercise __________ times a day. Stretching Exercises  These exercises warm up your muscles and joints and improve the movement and flexibility of your shoulder. These exercises also help to relieve pain, numbness, and tingling. These exercises are done using your healthy shoulder to help stretch the muscles of your injured shoulder. Exercise E: Corner Stretch (External Rotation and Abduction) 1. Stand in a doorway with one of your feet slightly in front of the other. This is called a staggered stance. If you cannot reach your forearms to the door frame, stand facing a corner of a room. 2. Choose one of the following positions as told by your health care provider: ? Place your hands and forearms on the door frame above your head. ? Place your hands and forearms on the door frame at the height of your head. ? Place your hands on the door frame at the height of your elbows. 3. Slowly move your weight onto your front foot until you feel a stretch across your chest and in the front of your shoulders. Keep your head and chest upright and keep your abdominal muscles tight. 4. Hold for __________ seconds. 5. To release the stretch, shift your weight to your back foot. Repeat __________ times. Complete this stretch __________ times a day. Exercise F:Extension, Standing 1. Stand and hold a broomstick, a cane, or a similar object behind your back. ? Your hands should be a little wider than shoulder-width apart. ? Your palms should face away from your back. 2. Keeping your elbows straight and keeping your shoulder muscles relaxed, move the stick away from your body until you feel a stretch in your shoulder. ? Avoid shrugging your shoulders while you move the stick. Keep your shoulder blade tucked down toward the middle of your back. 3. Hold for __________ seconds. 4. Slowly return to the starting position. Repeat __________ times. Complete this exercise __________ times a  day. Strengthening Exercises           These exercises build strength and endurance in your shoulder. Endurance is the ability to use your muscles for a long time, even after they get tired. Exercise G:External Rotation 1. Sit in a stable chair without armrests. 2. Secure an exercise band at elbow height on your left / right side. 3. Place a soft object, such as a folded towel or a small pillow, between your left / right upper arm and your body to move your elbow a few inches away (about 10 cm) from your side. 4. Hold the end of the band so it is tight and there is no slack. 5. Keeping your elbow pressed against the soft object, move your left / right forearm out, away from your abdomen. Keep your body steady so only your forearm moves. 6. Hold for __________ seconds. 7. Slowly return to the starting position. Repeat __________ times. Complete this exercise __________ times a day. Exercise H:Shoulder Abduction 1. Sit in a stable chair without armrests, or stand. 2. Hold a __________ weight in your   left / right hand, or hold an exercise band with both hands. 3. Start with your arms straight down and your left / right palm facing in, toward your body. 4. Slowly lift your left / right hand out to your side. Do not lift your hand above shoulder height unless your health care provider tells you that this is safe. ? Keep your arms straight. ? Avoid shrugging your shoulder while you do this movement. Keep your shoulder blade tucked down toward the middle of your back. 5. Hold for __________ seconds. 6. Slowly lower your arm, and return to the starting position. Repeat __________ times. Complete this exercise __________ times a day. Exercise I:Shoulder Extension 1. Sit in a stable chair without armrests, or stand. 2. Secure an exercise band to a stable object in front of you where it is at shoulder height. 3. Hold one end of the exercise band in each hand. Your palms should face each  other. 4. Straighten your elbows and lift your hands up to shoulder height. 5. Step back, away from the secured end of the exercise band, until the band is tight and there is no slack. 6. Squeeze your shoulder blades together as you pull your hands down to the sides of your thighs. Stop when your hands are straight down by your sides. Do not let your hands go behind your body. 7. Hold for __________ seconds. 8. Slowly return to the starting position. Repeat __________ times. Complete this exercise __________ times a day. Exercise J:Standing Shoulder Row 1. Sit in a stable chair without armrests, or stand. 2. Secure an exercise band to a stable object in front of you so it is at waist height. 3. Hold one end of the exercise band in each hand. Your palms should be in a thumbs-up position. 4. Bend each of your elbows to an "L" shape (about 90 degrees) and keep your upper arms at your sides. 5. Step back until the band is tight and there is no slack. 6. Slowly pull your elbows back behind you. 7. Hold for __________ seconds. 8. Slowly return to the starting position. Repeat __________ times. Complete this exercise __________ times a day. Exercise K:Shoulder Press-Ups 1. Sit in a stable chair that has armrests. Sit upright, with your feet flat on the floor. 2. Put your hands on the armrests so your elbows are bent and your fingers are pointing forward. Your hands should be about even with the sides of your body. 3. Push down on the armrests and use your arms to lift yourself off of the chair. Straighten your elbows and lift yourself up as much as you comfortably can. ? Move your shoulder blades down, and avoid letting your shoulders move up toward your ears. ? Keep your feet on the ground. As you get stronger, your feet should support less of your body weight as you lift yourself up. 4. Hold for __________ seconds. 5. Slowly lower yourself back into the chair. Repeat __________ times. Complete  this exercise __________ times a day. Exercise L: Wall Push-Ups 1. Stand so you are facing a stable wall. Your feet should be about one arm-length away from the wall. 2. Lean forward and place your palms on the wall at shoulder height. 3. Keep your feet flat on the floor as you bend your elbows and lean forward toward the wall. 4. Hold for __________ seconds. 5. Straighten your elbows to push yourself back to the starting position. Repeat __________ times. Complete this exercise __________ times   a day. This information is not intended to replace advice given to you by your health care provider. Make sure you discuss any questions you have with your health care provider. Document Released: 12/30/2004 Document Revised: 06/21/2017 Document Reviewed: 10/27/2014 Elsevier Interactive Patient Education  2019 [employer]  

## 2018-04-12 ENCOUNTER — Telehealth: Payer: Self-pay | Admitting: Rheumatology

## 2018-04-12 ENCOUNTER — Other Ambulatory Visit: Payer: Self-pay | Admitting: Cardiology

## 2018-04-12 DIAGNOSIS — I272 Pulmonary hypertension, unspecified: Secondary | ICD-10-CM

## 2018-04-12 DIAGNOSIS — I341 Nonrheumatic mitral (valve) prolapse: Secondary | ICD-10-CM

## 2018-04-12 NOTE — Telephone Encounter (Signed)
Labs have been re-faxed to Dr. Forde Dandy.

## 2018-04-12 NOTE — Telephone Encounter (Signed)
Patient left a voicemail stating she had labwork done at our office on 03/23/18 and requested that the results be sent to her PCP Dr. Forde Dandy.  Patient states she has an appointment with Dr. Forde Dandy tomorrow 04/13/18 and was told they have not received a copy of her results.  Patient states she doesn't want duplicate bloodwork done and requests the results be sent today 04/12/18.

## 2018-04-20 ENCOUNTER — Other Ambulatory Visit: Payer: Self-pay | Admitting: Neurosurgery

## 2018-04-20 DIAGNOSIS — M48061 Spinal stenosis, lumbar region without neurogenic claudication: Secondary | ICD-10-CM

## 2018-04-30 ENCOUNTER — Other Ambulatory Visit: Payer: Self-pay | Admitting: Rheumatology

## 2018-04-30 ENCOUNTER — Ambulatory Visit
Admission: RE | Admit: 2018-04-30 | Discharge: 2018-04-30 | Disposition: A | Discharge status: Home / Self Care | Payer: Medicare Other | Source: Ambulatory Visit | Attending: Neurosurgery | Admitting: Neurosurgery

## 2018-04-30 DIAGNOSIS — M48061 Spinal stenosis, lumbar region without neurogenic claudication: Secondary | ICD-10-CM

## 2018-05-01 NOTE — Telephone Encounter (Signed)
Last Visit: 03/23/18 Next Visit: 08/24/18 Labs: 03/23/18 Anemia is stable. Creatinine is trending down and GFR has improved  Okay to refill per Dr Estanislado Pandy

## 2018-05-04 ENCOUNTER — Other Ambulatory Visit: Payer: Self-pay | Admitting: Rheumatology

## 2018-05-04 NOTE — Telephone Encounter (Signed)
Last Visit: 03/23/18 Next Visit: 08/24/18 Labs: 1/23/20Anemia is stable. Creatinine is trending down and GFR has improved PLQ eye exam: 10/11/2017 normal.  Okay to refill per Dr. Deveshwar  

## 2018-05-09 ENCOUNTER — Ambulatory Visit: Payer: Self-pay

## 2018-05-09 DIAGNOSIS — I341 Nonrheumatic mitral (valve) prolapse: Secondary | ICD-10-CM

## 2018-05-09 DIAGNOSIS — I272 Pulmonary hypertension, unspecified: Secondary | ICD-10-CM

## 2018-05-16 ENCOUNTER — Telehealth: Payer: Self-pay

## 2018-05-17 ENCOUNTER — Other Ambulatory Visit: Payer: Self-pay | Admitting: Neurosurgery

## 2018-05-18 ENCOUNTER — Ambulatory Visit: Payer: Self-pay | Admitting: Cardiology

## 2018-05-23 ENCOUNTER — Telehealth: Payer: Self-pay | Admitting: Rheumatology

## 2018-05-23 NOTE — Telephone Encounter (Signed)
Patient called stating

## 2018-05-23 NOTE — Telephone Encounter (Signed)
Error message

## 2018-05-30 ENCOUNTER — Other Ambulatory Visit: Payer: Self-pay | Admitting: Cardiology

## 2018-05-30 ENCOUNTER — Other Ambulatory Visit: Payer: Self-pay | Admitting: Rheumatology

## 2018-05-30 NOTE — Telephone Encounter (Signed)
Last Visit: 03/23/18 Next Visit: 08/24/18 Labs: 1/23/20Anemia is stable. Creatinine is trending down and GFR has improved PLQ eye exam: 10/11/2017 normal.  Okay to refill per Dr. Estanislado Pandy

## 2018-06-05 ENCOUNTER — Other Ambulatory Visit: Payer: Self-pay | Admitting: Neurosurgery

## 2018-06-16 ENCOUNTER — Ambulatory Visit: Payer: Self-pay | Admitting: Cardiology

## 2018-06-23 ENCOUNTER — Encounter: Payer: Self-pay | Admitting: Cardiology

## 2018-06-23 ENCOUNTER — Ambulatory Visit (INDEPENDENT_AMBULATORY_CARE_PROVIDER_SITE_OTHER): Payer: Medicare Other | Admitting: Cardiology

## 2018-06-23 ENCOUNTER — Other Ambulatory Visit: Payer: Self-pay

## 2018-06-23 ENCOUNTER — Telehealth: Payer: Self-pay

## 2018-06-23 VITALS — BP 147/75 | HR 70 | Ht 64.0 in | Wt 120.0 lb

## 2018-06-23 DIAGNOSIS — R0989 Other specified symptoms and signs involving the circulatory and respiratory systems: Secondary | ICD-10-CM

## 2018-06-23 DIAGNOSIS — I10 Essential (primary) hypertension: Secondary | ICD-10-CM | POA: Diagnosis not present

## 2018-06-23 DIAGNOSIS — I340 Nonrheumatic mitral (valve) insufficiency: Secondary | ICD-10-CM

## 2018-06-23 DIAGNOSIS — I951 Orthostatic hypotension: Secondary | ICD-10-CM

## 2018-06-23 DIAGNOSIS — I071 Rheumatic tricuspid insufficiency: Secondary | ICD-10-CM

## 2018-06-23 NOTE — Telephone Encounter (Signed)
Pt called aad wanted refills on his meds sent to [place] in [place], as these ok to send?

## 2018-06-23 NOTE — Progress Notes (Addendum)
Virtual Visit via Telephone Note: Patient unable to use video assisted device.  This visit type was conducted due to national recommendations for restrictions regarding the COVID-19 Pandemic (e.g. social distancing).  This format is felt to be most appropriate for this patient at this time.  All issues noted in this document were discussed and addressed.  No physical exam was performed.  The patient has consented to conduct a Telehealth visit and understands insurance will be billed.   I connected with@, on 06/23/18 at  by TELEPHONE and verified that I am speaking with the correct person using two identifiers.   I discussed the limitations of evaluation and management by telemedicine and the availability of in person appointments. The patient expressed understanding and agreed to proceed.   I have discussed with patient regarding the safety during COVID Pandemic and steps and precautions to be taken including social distancing, frequent hand wash and use of detergent soap, gels with the patient. I asked the patient to avoid touching mouth, nose, eyes, ears with the hands. I encouraged regular walking around the neighborhood and exercise and regular diet, as long as social distancing can be maintained.  Primary Physician/Referring:  [name], MD  Patient ID: Gloria Anderson, female    DOB: [birth year], 85 y.o.   MRN: [id number]  Chief Complaint  Patient presents with  . Mitral Valve Prolapse    echo results  . Dizziness    orthostatic hypotension  . Hypertension    HPI: Gloria Anderson  is a 85 y.o. female  with essential hypertension and orthostatic hypotension, mild hyperlipidemia, vascular bruit including abdominal femoral renal and carotid without significant vascular disease by prior scan in, renal duplex in 2017 was normal, carotid duplex had revealed very minimal plaque in 2017. She was recommended statins due to moderate amount of atherosclerotic plaque noted in the  abdominal aorta and carotid bruit.  She now presents here for 6 month follow-up of hypertension and orthostatic hypotension. This is a telemedicine visit after her echo and to f/u on symptoms.  I had added hydralazine 25 mg to be taken on a p.r.n. basis for systolic her pressure greater than 150 mmHg. She has felt stable since then. Has not been too concerned about elevated blood pressure and symptoms of orthostasis has remained stable.  However only today due to being nervous, she noticed her BP to be high for the first time. She has been compliant with support stocking.  She states that her back pain is better, but told to have severe stenosis.  Past Medical History:  Diagnosis Date  . Arthritis   . Depression   . GERD (gastroesophageal reflux disease)   . Hypertension   . Hypothyroidism   . Osteoporosis   . Polymyalgia ([place])     Past Surgical History:  Procedure Laterality Date  . COLONOSCOPY W/ POLYPECTOMY    . EYE SURGERY     both cataracts  . NASAL SINUS SURGERY    . SHOULDER ARTHROSCOPY  2007   rt   . TONSILLECTOMY    . TRIGGER FINGER RELEASE  03/19/2011   Procedure: RELEASE TRIGGER FINGER/A-1 PULLEY;  Surgeon: Cammie Sickle., MD;  Location: [facility];  Service: Orthopedics;  Laterality: Right;  Procedure:  Release Right Long and Ring Trigger Fingers, Release Left Long Trigger Finger, Injection Left Long Proximal Phalangeal Joint    Social History   Socioeconomic History  . Marital status: Widowed    Spouse name: Not on file  .  Number of children: 2  . Years of education: HS  . Highest education level: Not on file  Occupational History  . Occupation: RETIRED    Employer: [employer]  . Financial resource strain: Not on file  . Food insecurity:    Worry: Not on file    Inability: Not on file  . Transportation needs:    Medical: Not on file    Non-medical: Not on file  Tobacco Use  . Smoking status: Never Smoker  .  Smokeless tobacco: Never Used  Substance and Sexual Activity  . Alcohol use: No    Alcohol/week: 0.0 standard drinks  . Drug use: Never  . Sexual activity: Not on file  Lifestyle  . Physical activity:    Days per week: Not on file    Minutes per session: Not on file  . Stress: Not on file  Relationships  . Social connections:    Talks on phone: Not on file    Gets together: Not on file    Attends religious service: Not on file    Active member of club or organization: Not on file    Attends meetings of clubs or organizations: Not on file    Relationship status: Not on file  . Intimate partner violence:    Fear of current or ex partner: Not on file    Emotionally abused: Not on file    Physically abused: Not on file    Forced sexual activity: Not on file  Other Topics Concern  . Not on file  Social History Narrative   Patient is widowed.   Patient has two children.   Patient does not drink any caffeine.    Patient has a high school education.   Patient is right-handed.             Current Outpatient Medications on File Prior to Visit  Medication Sig Dispense Refill  . Acetaminophen (TYLENOL EXTRA STRENGTH PO) Take by mouth as needed.    Marland Kitchen alendronate (FOSAMAX) 70 MG tablet TAKE 1 TABLET BY MOUTH 1  TIME WEEKLY WITH A FULL  GLASS OF WATER ON AN EMPTY  STOMACH 12 tablet 0  . antiseptic oral rinse (BIOTENE) LIQD 15 mLs by Mouth Rinse route as needed for dry mouth.    Marland Kitchen atorvastatin (LIPITOR) 10 MG tablet     . BIOTIN FORTE PO Take by mouth.    . Calcium Citrate (CITRACAL PO) Take 600 mg by mouth.     . carvedilol (COREG) 3.125 MG tablet TAKE 1 TABLET BY MOUTH TWO  TIMES DAILY 180 tablet 1  . clonazePAM (KLONOPIN) 0.5 MG tablet TAKE ONE-HALF TABLET BY  MOUTH AT BEDTIME 45 tablet 1  . Docusate Calcium (STOOL SOFTENER PO) Take 1 tablet by mouth daily. 200 mg    . fish oil-omega-3 fatty acids 1000 MG capsule Take 2 g by mouth daily.    . Garlic 0947 MG TBEC Take 1 tablet by  mouth daily.    . hydrALAZINE (APRESOLINE) 25 MG tablet Take 25 mg by mouth as needed.     . hydroxychloroquine (PLAQUENIL) 200 MG tablet TAKE 1 TABLET BY MOUTH  DAILY 90 tablet 0  . Iron-Vitamins (GERITOL PO) Take by mouth.    . levothyroxine (SYNTHROID, LEVOTHROID) 50 MCG tablet Take 50 mcg by mouth daily.    Marland Kitchen omeprazole (PRILOSEC) 20 MG capsule Take 20 mg by mouth daily.    . polyethylene glycol (MIRALAX / GLYCOLAX) packet Take 17  g by mouth daily as needed.    . Probiotic Product (PROBIOTIC DAILY PO) Take 1 tablet by mouth daily.     No current facility-administered medications on file prior to visit.     Review of Systems  Constitution: Negative for chills, decreased appetite, malaise/fatigue and weight gain.  Cardiovascular: Negative for dyspnea on exertion, leg swelling, near-syncope and syncope.  Endocrine: Negative for cold intolerance.  Hematologic/Lymphatic: Does not bruise/bleed easily.  Musculoskeletal: Positive for back pain. Negative for joint swelling.  Gastrointestinal: Negative for abdominal pain, anorexia and change in bowel habit.  Neurological: Positive for dizziness (occasional). Negative for headaches and light-headedness.  Psychiatric/Behavioral: Negative for depression and substance abuse.  All other systems reviewed and are negative.     Objective:  Blood pressure (!) 147/75, pulse 70, height '5\' 4"'  (1.626 m), weight 120 lb (54.4 kg). Body mass index is 20.6 kg/m.  Physical exam not performed due to telephone encounter only. Prior visit information is as below.   Physical Exam  Constitutional: She appears well-developed and well-nourished. No distress.  HENT:  Head: Atraumatic.  Eyes: Conjunctivae are normal.  Neck: Neck supple. No JVD present. No thyromegaly present.  Cardiovascular: Normal rate, regular rhythm and intact distal pulses. Exam reveals no gallop.  Murmur heard.  Crescendo-decrescendo mid to late systolic murmur is present with a grade of  3/6 at the apex. Pulses:      Carotid pulses are on the right side with bruit and on the left side with bruit.      Femoral pulses are on the right side with bruit and on the left side with bruit. Pulmonary/Chest: Effort normal and breath sounds normal.  Abdominal: Soft. Bowel sounds are normal. She exhibits abdominal bruit.  Musculoskeletal: Normal range of motion.        General: No edema.  Neurological: She is alert.  Skin: Skin is warm and dry.  Psychiatric: She has a normal mood and affect.   Radiology: No results found. Laboratory Examination:   03/22/2017: Creatinine 1.1, EGFR 47/57, potassium 4.4, CMP normal.  RBC 3.7, normal hemoglobin, hematocrit 35.8, MCV 97, MCH 33, RDW 11.7%.  Cholesterol 268, triglycerides 58, HDL 94, LDL 162.  TSH 1.9.  CMP Latest Ref Rng & Units 03/23/2018 09/20/2017 11/30/2016  Glucose 65 - 99 mg/dL 92 101(H) 86  BUN 7 - 25 mg/dL 21 23 28(H)  Creatinine 0.60 - 0.88 mg/dL 1.02(H) 1.13(H) 1.19(H)  Sodium 135 - 146 mmol/L 140 139 140  Potassium 3.5 - 5.3 mmol/L 4.5 4.4 4.4  Chloride 98 - 110 mmol/L 101 103 105  CO2 20 - 32 mmol/L '30 26 27  ' Calcium 8.6 - 10.4 mg/dL 9.9 9.4 9.1  Total Protein 6.1 - 8.1 g/dL 6.8 6.9 6.7  Total Bilirubin 0.2 - 1.2 mg/dL 0.9 0.9 0.7  AST 10 - 35 U/L 34 26 25  ALT 6 - 29 U/L '28 16 17   ' CBC Latest Ref Rng & Units 03/23/2018 09/20/2017 11/30/2016  WBC 3.8 - 10.8 Thousand/uL 5.2 5.8 4.9  Hemoglobin 11.7 - 15.5 g/dL 11.4(L) 11.6(L) 10.6(L)  Hematocrit 35.0 - 45.0 % 34.0(L) 33.4(L) 31.5(L)  Platelets 140 - 400 Thousand/uL 182 201 179   Lipid Panel  No results found for: CHOL, TRIG, HDL, CHOLHDL, VLDL, LDLCALC, LDLDIRECT HEMOGLOBIN A1C No results found for: HGBA1C, MPG TSH No results for input(s): TSH in the last 8760 hours.  Cardiac studies:   Exercise Treadmill Stress Test 10/07/2017:  Indication: chest pain The patient exercised on [facility]  protocol for  06:19 min. Patient achieved  7.40 METS and reached HR  114 bpm, which  is  82 % of maximum age-predicted HR.  Stress test terminated due to fatigue.   Exercise capacity was fair for age. HR Response to Exercise: Appropriate. BP Response to Exercise: Normal resting BP- appropriate response. Chest Pain: none. Arrhythmias: Occasional PVC s. Resting EKG demonstrates Normal sinus rhythm. ST Changes: With peak exercise there was no ST-T changes of ischemia.  Overall Impression:  Submaximal stress test with no ischemic changes. Continue primary/secondary prevention.  Echocardiogram 05/09/2018: Left ventricle cavity is normal in size. Normal left ventricular shape. Normal global wall motion. Doppler evidence of grade II (pseudonormal) diastolic dysfunction, elevated LAP. Calculated EF 56%. Left atrial cavity is moderately dilated. Right atrial cavity is mildly dilated. Mild prolapse of the mitral valve leaflets. Moderate (Grade III) mitral regurgitation. Moderate tricuspid regurgitation. Moderate pulmonary hypertension. Estimated pulmonary artery systolic pressure 55 mmHg. Small circumferential pericardial effusion with no hemodynamic compromise. No significant change compared to previous study on 11/01/2017. No change in Pulmonary hypertension from 11/08/2011  Assessment:    Orthostatic hypotension  Essential hypertension  Bilateral carotid bruits  Moderate mitral regurgitation - Plan: PCV ECHOCARDIOGRAM COMPLETE  Moderate tricuspid regurgitation  EKG 05/12/2017: Normal sinus rhythm at rate of 66 bpm, borderline criteria for left abnormality, otherwise normal EKG.   Recommendations:    Patient has done remarkably wellWith regard to orthostatic hypotension.  She is using hydralazine occasionally for systolic blood pressure greater than 150 mmHg.  She has not had any further severe dizziness.  She denies shortness of breath or chest pain, no leg edema, hence no clinical indication for CHF.  I have reviewed her echocardiogram the patient, interestingly  although she has moderate to moderately severe TR and moderate MR, moderate pulmonary hypertension, the PA pressures have not changed when compared to echocardiogram in 2013.  Hence I feel overall from cardiac standpoint she has remained stable and there is no indication for valve replacement or repair.  We'll continue to monitor her LV function closely with repeat echo in 6 months.  I'll see her back after that. I do not know if she has had recent lipid profile testing.  She has been set up for Back surgery for spinal stenosis and has been set for June 16th by Dr. Dr. Erline Levine, I have sent the Surgical clearance/lowr risk letter to him. She is also had a low risk treadmill exercise stress test on 10/07/2017.  Adrian Prows, MD, [facility] 06/23/2018, 3:11 PM [facility]. [place] Pager: [phone] Office: [phone] If no answer Cell [phone]

## 2018-06-26 NOTE — Telephone Encounter (Signed)
This message is an error, please disregard above

## 2018-06-30 ENCOUNTER — Ambulatory Visit: Admit: 2018-06-30 | Payer: Medicare Other | Admitting: Neurosurgery

## 2018-06-30 SURGERY — LUMBAR LAMINECTOMY/DECOMPRESSION MICRODISCECTOMY 2 LEVELS
Anesthesia: General

## 2018-07-06 ENCOUNTER — Telehealth: Payer: Self-pay

## 2018-07-06 NOTE — Telephone Encounter (Signed)
Noted! Thank you

## 2018-07-06 NOTE — Telephone Encounter (Signed)
Unable to get in contact with Chris(patient's son) to convert his mothers office visit into a doxy.me visit with Megan on 07/11/2018. I left a voicemail asking him to return my call. Office number was provided.

## 2018-07-06 NOTE — Telephone Encounter (Signed)
Pt returned call and stated she does not need the appt at this time due to her being scheduled for surgery and she has other things on her mind. She states she is doing fine and that she will call back to r/s at a later time.

## 2018-07-08 ENCOUNTER — Other Ambulatory Visit: Payer: Self-pay | Admitting: Neurology

## 2018-07-08 DIAGNOSIS — F514 Sleep terrors [night terrors]: Secondary | ICD-10-CM

## 2018-07-10 ENCOUNTER — Telehealth: Payer: Self-pay | Admitting: Neurology

## 2018-07-10 DIAGNOSIS — F514 Sleep terrors [night terrors]: Secondary | ICD-10-CM

## 2018-07-10 MED ORDER — CLONAZEPAM 0.5 MG PO TABS: ORAL_TABLET | ORAL | 3 refills | Status: DC

## 2018-07-10 NOTE — Telephone Encounter (Signed)
Patient may continue with 1 /2 tab at night if still effective, she will now have enough medication to take a while tab if needed.

## 2018-07-10 NOTE — Addendum Note (Signed)
Addended by: Darleen Crocker on: 07/10/2018 01:59 PM   Modules accepted: Orders

## 2018-07-10 NOTE — Telephone Encounter (Signed)
Called the patient to make her aware that Dr Brett Fairy has sent the refill request for the pt. Informed the patient that even the label will read take 1 tablet at bedtime. I informed the patient Dr Brett Fairy would like the patient to continue with taking it 1/2 tablet at bedtime. Patient was appreciative for the call and I will correct so that the script reads this way going forward.

## 2018-07-11 ENCOUNTER — Ambulatory Visit: Payer: Medicare Other | Admitting: Adult Health

## 2018-08-04 NOTE — H&P (Signed)
Patient ID:   [id number] Patient: Gloria Anderson  Date of Birth: 12-29-1933 Visit Type: Office Visit   Date: 05/11/2018 10:00 AM Provider: Marchia Meiers. Vertell Limber MD   This 85 year old female presents for Follow Up of back and leg.  HISTORY OF PRESENT ILLNESS:  1.  Follow Up of back and leg  The patient returns to review her imaging of her lumbar spine.  She has had a lumbar MRI performed.  This shows severe spinal stenosis, most marked at the L4-5 level with there is virtually no epidural space and severe nerve compression with hypertrophied ligament and bulging disc.  At L3-4 there is moderately severe spinal stenosis worse to the left.  The patient is reluctant to have surgery but says she is really not able to tolerate her current situation and is becoming increasingly immobile.  Her cardiologist has seen her recently but will clear her for surgery.  I discussed the situation with the patient and her son at length in the office today.  I examined her and reviewed her imaging and my surgical recommendations in great detail.         Medical/Surgical/Interim History Reviewed, no change.  Last detailed document date:04/10/2018.     PAST MEDICAL HISTORY, SURGICAL HISTORY, FAMILY HISTORY, SOCIAL HISTORY AND REVIEW OF SYSTEMS I have reviewed the patient's past medical, surgical, family and social history as well as the comprehensive review of systems as included on the [facility] history form dated 04/10/2018, which I have signed.  Family History:  Reviewed, no changes.  Last detailed document date:04/10/2018.   Social History: Reviewed, no changes. Last detailed document date: 04/10/2018.    MEDICATIONS: (added, continued or stopped this visit) Started Medication Directions Instruction Stopped   biotin 1 mg tablet      carvedilol 3.125 mg tablet take 1 tablet by oral route 2 times every day with food     clonazepam 0.5 mg tablet take 1 tablet by oral  route  every day     CULTURELLE HEALTH-WELLNESS  BUCCAL      fish oil 1200 ORAL TABLET      garlic 0,998 mg capsule      Geritol Complete 16 mg iron-0.38 mg tablet      hydralazine 25 mg tablet take 1 tablet by oral route 3 times every day with food     hydroxychloroquine 200 mg tablet take 1 tablet by oral route 2 times every day     Miralax 17 gram/dose oral powder take (17G)  by oral route  every day mixed with 8 oz. water, juice, soda, coffee or tea     omeprazole 20 mg tablet,delayed release take 1 capsule by oral route  every day     Renacidin 1980.6 mg-59.4mg -980.4mg /53mL irrigation solution instill 30 milliliter by intravesical route  every 4 - 6 hours into bladder via urethral catheter/cystostomy tube clamp 30-60 min then drain     Tirosint 50 mcg capsule take 1 capsule by oral route  every day     Tums 200 mg calcium (500 mg) chewable tablet        ALLERGIES: Ingredient Reaction Medication Name Comment  NO KNOWN ALLERGIES     No known allergies.    PHYSICAL EXAM:   Vitals Date Temp F BP Pulse Ht In Wt Lb BMI BSA Pain Score  05/11/2018  150/68 78 64 150 25.75  6/10      IMPRESSION:   Severe lumbar spinal stenosis L3-4 and L4-5  levels.  I have recommended decompression without fusion L3 through L5 levels.  PLAN:  Risks and benefits were discussed in detail with the patient.  She wished to proceed with surgery.  She will need cardiology clearance by Dr. Einar Gip prior to proceeding.  I answered the patient's questions and those of her son in great detail  Orders: Instruction(s)/Education: Assessment Instruction  I10 Hypertension education  I10 Hypertension education  Z68.25 Lifestyle education regarding diet  Z68.25 Lifestyle education regarding diet   Completed Orders (this encounter) Order Details Reason Side Interpretation Result Initial Treatment Date Region  Hypertension education Patient to follow up with primary care provider        Lifestyle education  regarding diet Patient encouraged to eat a well balance diet        Hypertension education Patient to follow up with primary care provider        Lifestyle education regarding diet Patient encouraged to eat a well balance diet         Assessment/Plan   # Detail Type Description   1. Assessment Degenerative lumbar spinal stenosis (M48.061).       2. Assessment Low back pain, unspecified back pain laterality, with sciatica presence unspecified (M54.5).       3. Assessment Lumbar radiculopathy (M54.16).       4. Assessment Essential (primary) hypertension (I10).       5. Assessment Body mass index (BMI) 25.0-25.9, adult (T61.44).   Plan Orders Today's instructions / counseling include(s) Lifestyle education regarding diet. Clinical information/comments: Patient encouraged to eat a well balance diet and Lifestyle education regarding diet. Clinical information/comments: Patient encouraged to eat a well balance diet.         Pain Management Plan Pain Scale: 6/10. Method: Numeric Pain Intensity Scale. Location: back. Onset: 05/08/2008. Duration: varies. Quality: discomforting. Pain management follow-up plan of care: Patient taking medication as prescribed.  Fall Risk Plan The patient has not fallen in the last year.              Provider:  Marchia Meiers. Vertell Limber MD  05/14/2018 03:49 PM Dictation edited by: Marchia Meiers. Vertell Limber    CC Providers: [name] [facility] [place],  [place]  [address]-   Shaili Deveshwar  [facility] [address] [place],  [address]-               Electronically signed by Marchia Meiers Vertell Limber MD on 05/14/2018 03:49 PM  Patient ID:   [id number] Patient: Gloria Anderson  Date of Birth: Apr 30, 1933 Visit Type: Office Visit   Date: 04/10/2018 11:30 AM Provider: Marchia Meiers. Vertell Limber MD   This 85 year old female presents for back and leg pain.  HISTORY OF PRESENT ILLNESS:   1.  back and leg pain  Gloria Anderson, 85 year old retired female, visits for evaluation.  She reports low back/buttock pain with occasional right leg cold/numbness to the ankle increased over the past 2 months with walking.  She recalls similar episode 10 years ago after walking long distances on a trip to [place].  Symptoms resolved during the 3 months before seeing Dr. Carloyn Manner.  Symptoms recurred in 2018 after another trip to [place].  Two months ago, after walking 15 minutes, she noted significant discomfort across her low back and numbness in the legs. Patient now notes that she cannot walk more than a couple hundred feet without needing to rest.  Patient notes snapping noise in her neck with  rotation  She also notes pain in her hips when sitting.  Tylenol OTC taken only as needed  History:  Arthritis, depression, GERD, HTN, hypothyroidism, osteoporosis, polymyalgia; hypotensive episode last month, echo scheduled in March Surgical history:  Cataracts bilaterally, sinus surgery, left trigger finger 2013, right shoulder scope 2007, tonsillectomy  Lumbar CT July 2018 on [place]          PAST MEDICAL/SURGICAL HISTORY:   (Detailed)    Disease/disorder Onset Date Management Date Comments    Sinus repaired  JMP 04/10/2018 -    Trigger finger  JMP 04/10/2018 -    RTC repaired  JMP 04/10/2018 -  Hypertension         Family History:  (Detailed)   Social History:  (Detailed) Tobacco use reviewed. Preferred language is [place].   Tobacco use status: Current non-smoker. Smoking status: Never smoker.  SMOKING STATUS Type Smoking Status Usage Per Day Years Used Total Pack Years   Never smoker      TOBACCO/VAPING EXPOSURE No passive vaping exposure. No passive smoke exposure.   HOME ENVIRONMENT/SAFETY The patient is not at risk for falls. The patient has not fallen in the last year.     MEDICATIONS: (added, continued or stopped this visit) Started Medication Directions  Instruction Stopped   biotin 1 mg tablet      carvedilol 3.125 mg tablet take 1 tablet by oral route 2 times every day with food     clonazepam 0.5 mg tablet take 1 tablet by oral route  every day     CULTURELLE HEALTH-WELLNESS  BUCCAL      fish oil 1200 ORAL TABLET      garlic 7,425 mg capsule      Geritol Complete 16 mg iron-0.38 mg tablet      hydralazine 25 mg tablet take 1 tablet by oral route 3 times every day with food     hydroxychloroquine 200 mg tablet take 1 tablet by oral route 2 times every day     Miralax 17 gram/dose oral powder take (17G)  by oral route  every day mixed with 8 oz. water, juice, soda, coffee or tea     omeprazole 20 mg tablet,delayed release take 1 capsule by oral route  every day     Renacidin 1980.6 mg-59.4mg -980.4mg /76mL irrigation solution instill 30 milliliter by intravesical route  every 4 - 6 hours into bladder via urethral catheter/cystostomy tube clamp 30-60 min then drain     Tirosint 50 mcg capsule take 1 capsule by oral route  every day     Tums 200 mg calcium (500 mg) chewable tablet        ALLERGIES: Ingredient Reaction Medication Name Comment  NO KNOWN ALLERGIES     No known allergies.   REVIEW OF SYSTEMS   See scanned patient registration form, dated, signed and dated on   Review of Systems Details System Neg/Pos Details  Constitutional Negative Chills, Fatigue, Fever, Malaise, Night sweats, Weight gain and Weight loss.  ENMT Negative Ear drainage, Hearing loss, Nasal drainage, Otalgia, Sinus pressure and Sore throat.  Eyes Negative Eye discharge, Eye pain and Vision changes.  Respiratory Negative Chronic cough, Cough, Dyspnea, Known TB exposure and Wheezing.  Cardio Negative Chest pain, Claudication, Edema and Irregular heartbeat/palpitations.  GI Negative Abdominal pain, Blood in stool, Change in stool pattern, Constipation, Decreased appetite, Diarrhea, Heartburn, Nausea and Vomiting.  GU Negative Dysuria, Hematuria, Polyuria  (Genitourinary), Urinary frequency, Urinary incontinence and Urinary retention.  Endocrine Negative Cold intolerance, Heat  intolerance, Polydipsia and Polyphagia.  Neuro Negative Dizziness, Extremity weakness, Gait disturbance, Headache, Memory impairment, Numbness in extremity, Seizures and Tremors.  Psych Negative Anxiety, Depression and Insomnia.  Integumentary Negative Brittle hair, Brittle nails, Change in shape/size of mole(s), Hair loss, Hirsutism, Hives, Pruritus, Rash and Skin lesion.  MS Positive Back pain.  Hema/Lymph Negative Easy bleeding, Easy bruising and Lymphadenopathy.  Allergic/Immuno Negative Contact allergy, Environmental allergies, Food allergies and Seasonal allergies.  Reproductive Negative Breast discharge, Breast lumps, Dysmenorrhea, Dyspareunia, History of abnormal PAP smear, Hot flashes, Irregular menses and Vaginal discharge.   PHYSICAL EXAM:   Vitals Date Temp F BP Pulse Ht In Wt Lb BMI BSA Pain Score  04/10/2018  153/70 84 64 120 20.6  7/10    PHYSICAL EXAM Details General Level of Distress: no acute distress Overall Appearance: normal  Head and Face  Right Left  Fundoscopic Exam:  normal normal    Cardiovascular Cardiac: regular rate and rhythm without murmur  Right Left  Carotid Pulses: normal normal  Respiratory Lungs: clear to auscultation  Neurological Orientation: normal Recent and Remote Memory: normal Attention Span and Concentration:   normal Language: normal Fund of Knowledge: normal  Right Left Sensation: normal normal Upper Extremity Coordination: normal normal  Lower Extremity Coordination: normal normal  Musculoskeletal Gait and Station: normal  Right Left Upper Extremity Muscle Strength: normal normal Lower Extremity Muscle Strength: normal normal Upper Extremity Muscle Tone:  normal normal Lower Extremity Muscle Tone: normal normal   Motor Strength Upper and lower extremity motor strength was tested in the  clinically pertinent muscles.     Deep Tendon Reflexes  Right Left Biceps: normal normal Triceps: normal normal Brachioradialis: normal normal Patellar: normal normal Achilles: normal normal  Cranial Nerves II. Optic Nerve/Visual Fields: normal III. Oculomotor: normal IV. Trochlear: normal V. Trigeminal: normal VI. Abducens: normal VII. Facial: normal VIII. Acoustic/Vestibular: normal IX. Glossopharyngeal: normal X. Vagus: normal XI. Spinal Accessory: normal XII. Hypoglossal: normal  Motor and other Tests Lhermittes: negative Rhomberg: negative Pronator drift: absent     Right Left Hoffman's: normal normal Clonus: normal normal Babinski: normal normal SLR: negative negative   Additional Findings:  Upon examination, able to stand on heels and toes, full strength in bilateral upper extremities, full strength in bilateral lower extremities, neurogenic claudication, absent ankle jerk bilaterally.   DIAGNOSTIC RESULTS:   09/13/16 L-spine CT without contrast Advanced posterior element hypertrophy, in conjunction with disc pathology results in severe stenosis at L4-5 and L3-4. Bilateral subarticular zone and foraminal zone narrowing is suspected at both levels affecting the L3, L4, and L5 nerve roots. This is not clearly worse on the right. Apparent progression compared with prior MRI 2011.    IMPRESSION:   L-spine CT without contrast reveals severe spinal stenosis and advanced posterior element hypertrophy at L4-5 and L3-4.  4-view L-spine X-rays reveal nl hip joints from what is readily visible on radiographs, no malalignment noted.  Upon examination, able to stand on heels and toes, full strength in bilateral upper extremities, full strength in bilateral lower extremities, negative SLR bilaterally, neurogenic claudication, absent ankle jerk bilaterally.  Recommended new imaging - recommended L-spine MRI without contrast as patient's previous L-spine CT was  performed in 2018. Patient will follow-up after imaging is complete to discuss options. Patient endorses that her symptoms are greatly interfering with her daily life but she would like to discuss non-surgical options. Advised patient that surgical intervention will likely be necessary.  PLAN:  1. L-spine MRI without contrast ordered  2. Follow-up after imaging is complete  Orders: Diagnostic Procedures: Assessment Procedure  M54.16 Lumbar Spine- AP/Lat/Flex/Ex  Instruction(s)/Education: Assessment Instruction  I10 Hypertension education   Completed Orders (this encounter) Order Details Reason Side Interpretation Result Initial Treatment Date Region  Lumbar Spine- AP/Lat/Flex/Ex      04/10/2018 All Levels to All Levels   Assessment/Plan   # Detail Type Description   1. Assessment Lumbar radiculopathy (M54.16).       2. Assessment Essential (primary) hypertension (I10).         Pain Management Plan Pain Scale: 7/10. Method: Numeric Pain Intensity Scale. Location: back and R leg. Onset: 05/08/2008. Duration: varies. Quality: discomforting. Pain management follow-up plan of care: Patient is taking OTC pain relieves for relief.  Fall Risk Plan The patient has not fallen in the last year.              Provider:  Marchia Meiers. Vertell Limber MD  04/11/2018 05:55 PM Dictation edited by: Mirian Mo    CC Providers: [name] [facility] [address] [place],  [place]  [address]-   Shaili Deveshwar  [facility] [address] [place], [place] [address]-               Electronically signed by Marchia Meiers Vertell Limber MD on 04/12/2018 07:39 AM

## 2018-08-07 ENCOUNTER — Telehealth: Payer: Self-pay | Admitting: Neurology

## 2018-08-07 DIAGNOSIS — F514 Sleep terrors [night terrors]: Secondary | ICD-10-CM

## 2018-08-07 MED ORDER — CLONAZEPAM 0.5 MG PO TABS: ORAL_TABLET | ORAL | 1 refills | Status: DC

## 2018-08-09 ENCOUNTER — Other Ambulatory Visit: Payer: Self-pay | Admitting: Rheumatology

## 2018-08-10 NOTE — Telephone Encounter (Signed)
Please advise 

## 2018-08-10 NOTE — Telephone Encounter (Signed)
Last Visit: 03/23/2018 Next Visit: 08/24/2018 Labs: 03/23/2018 Eye exam: 10/21/2017 Last fill 05/30/2018 for 90 day supply.  Okay to refill Plaquenil.  Last refill Fosamax 05/30/2018 for 90 days.  Okay to fill.

## 2018-08-11 ENCOUNTER — Encounter (HOSPITAL_COMMUNITY)
Admission: RE | Admit: 2018-08-11 | Discharge: 2018-08-11 | Disposition: A | Discharge status: Home / Self Care | Payer: Medicare Other | Source: Ambulatory Visit | Attending: Neurosurgery | Admitting: Neurosurgery

## 2018-08-11 ENCOUNTER — Other Ambulatory Visit (HOSPITAL_COMMUNITY)
Admission: RE | Admit: 2018-08-11 | Discharge: 2018-08-11 | Disposition: A | Discharge status: Home / Self Care | Payer: Medicare Other | Source: Ambulatory Visit | Attending: Neurosurgery | Admitting: Neurosurgery

## 2018-08-11 ENCOUNTER — Encounter (HOSPITAL_COMMUNITY): Payer: Self-pay

## 2018-08-11 ENCOUNTER — Other Ambulatory Visit: Payer: Self-pay

## 2018-08-11 DIAGNOSIS — Z01818 Encounter for other preprocedural examination: Secondary | ICD-10-CM | POA: Diagnosis present

## 2018-08-11 DIAGNOSIS — Z1159 Encounter for screening for other viral diseases: Secondary | ICD-10-CM | POA: Diagnosis not present

## 2018-08-11 HISTORY — DX: Other specified postprocedural states: R11.2

## 2018-08-11 HISTORY — DX: Polymyalgia rheumatica: M35.3

## 2018-08-11 HISTORY — DX: Other specified postprocedural states: Z98.890

## 2018-08-11 HISTORY — DX: Constipation, unspecified: K59.00

## 2018-08-11 HISTORY — DX: Other complications of anesthesia, initial encounter: T88.59XA

## 2018-08-11 LAB — CBC
HCT: 34.6 % — ABNORMAL LOW (ref 36.0–46.0)
Hemoglobin: 11.6 g/dL — ABNORMAL LOW (ref 12.0–15.0)
MCH: 32.5 pg (ref 26.0–34.0)
MCHC: 33.5 g/dL (ref 30.0–36.0)
MCV: 96.9 fL (ref 80.0–100.0)
Platelets: 183 10*3/uL (ref 150–400)
RBC: 3.57 MIL/uL — ABNORMAL LOW (ref 3.87–5.11)
RDW: 13.1 % (ref 11.5–15.5)
WBC: 5.3 10*3/uL (ref 4.0–10.5)
nRBC: 0 % (ref 0.0–0.2)

## 2018-08-11 LAB — BASIC METABOLIC PANEL
Anion gap: 10 (ref 5–15)
BUN: 22 mg/dL (ref 8–23)
CO2: 25 mmol/L (ref 22–32)
Calcium: 9.5 mg/dL (ref 8.9–10.3)
Chloride: 105 mmol/L (ref 98–111)
Creatinine, Ser: 1.26 mg/dL — ABNORMAL HIGH (ref 0.44–1.00)
GFR calc Af Amer: 46 mL/min — ABNORMAL LOW (ref >60)
GFR calc non Af Amer: 40 mL/min — ABNORMAL LOW (ref >60)
Glucose, Bld: 110 mg/dL — ABNORMAL HIGH (ref 70–99)
Potassium: 4.6 mmol/L (ref 3.5–5.1)
Sodium: 140 mmol/L (ref 135–145)

## 2018-08-11 LAB — SURGICAL PCR SCREEN
MRSA, PCR: NEGATIVE
Staphylococcus aureus: NEGATIVE

## 2018-08-11 NOTE — Pre-Procedure Instructions (Signed)
Gloria Anderson  08/11/2018     Your procedure is scheduled on Tuesday, June 16  Report to [facility] Admitting at 12:30 A.M.   Call this number if you have problems the morning of surgery: [phone]  This is the number for the Pre- Surgical Desk.   Remember:  Do not eat or drink after midnight Monday, June 15                   Take these medicines the morning of surgery with A SIP OF WATER: carvedilol (COREG) hydroxychloroquine (PLAQUENIL)   levothyroxine (SYNTHROID, LEVOTHROID) omeprazole (PRILOSEC)   If needed: hydrALAZINE (APRESOLINE)   STOP taking Aspirin, Aspirin Products (Goody Powder, Excedrin Migraine), Ibuprofen (Advil), Naproxen (Aleve), Vitamins and Herbal Products (ie Fish Oil).   [facility]- Preparing For Surgery  Before surgery, you can play an important role. Because skin is not sterile, your skin needs to be as free of germs as possible. You can reduce the number of germs on your skin by washing with CHG (chlorahexidine gluconate) Soap before surgery.  CHG is an antiseptic cleaner which kills germs and bonds with the skin to continue killing germs even after washing.    Oral Hygiene is also important to reduce your risk of infection.  Remember - BRUSH YOUR TEETH THE MORNING OF SURGERY WITH YOUR REGULAR TOOTHPASTE  Please do not use if you have an allergy to CHG or antibacterial soaps. If your skin becomes reddened/irritated stop using the CHG.  Do not shave (including legs and underarms) for at least 48 hours prior to first CHG shower. It is OK to shave your face.  Please follow these instructions carefully.   1. Shower the NIGHT BEFORE SURGERY and the MORNING OF SURGERY with CHG.   2. If you chose to wash your hair, wash your hair first as usual with your normal shampoo.  3. After you shampoo, wash your face and private area with the soap you use at home, then rinse your hair and body thoroughly to remove the shampoo and soap.  4. Use  CHG as you would any other liquid soap. You can apply CHG directly to the skin and wash gently with a scrungie or a clean washcloth.   5. Apply the CHG Soap to your body ONLY FROM THE NECK DOWN.  Do not use on open wounds or open sores. Avoid contact with your eyes, ears, mouth and genitals (private parts).  6. Wash thoroughly, paying special attention to the area where your surgery will be performed.  7. Thoroughly rinse your body with warm water from the neck down.  8. DO NOT shower/wash with your normal soap after using and rinsing off the CHG Soap.  9. Pat yourself dry with a CLEAN TOWEL.  10. Wear CLEAN PAJAMAS to bed the night before surgery, wear comfortable clothes the morning of surgery  11. Place CLEAN SHEETS on your bed the night of your first shower and DO NOT SLEEP WITH PETS.  Day of Surgery: Shower as instructed above  Do not wear lotions, powders, or perfumes, or deodorant. Please wear clean clothes to the hospital/surgery center.   Remember to brush your teeth WITH YOUR REGULAR TOOTHPASTE  Do not wear jewelry, make-up or nail polish.  Do not shave 48 hours prior to surgery.    Do not bring valuables to the hospital.  [facility] is not responsible for any belongings or valuables.  Contacts, dentures or bridgework may not be  worn into surgery.  Leave your suitcase in the car.  After surgery it may be brought to your room.  For patients admitted to the hospital, discharge time will be determined by your treatment team.  Patients discharged the day of surgery will not be allowed to drive home.   Please read over the following fact sheets that you were givenPain Management, Coughing and Deep Breathing, Su rgigical Site Infections, 10 Things you can do to [place] at [facility]

## 2018-08-11 NOTE — Progress Notes (Addendum)
PCP - Dr Roque Cash  Cardiologist - Dr Einar Gip  Chest x-ray - na  EKG - 08/11/2018  Stress Test - 09/2017  ECHO - 05/09/2018  Cardiac Cath - no  Sleep Study - no CPAP - no  LABS- CBC, BMP, PCR  ASA-no  ERAS- no HA1C-na  Fasting Blood Sugar - 0 Checks Blood Sugar ___na__ times a day  Anesthesia-  Pt denies having chest pain, sob, or fever at this time. All instructions explained to the pt, with a verbal understanding of the material. Pt agrees to go over the instructions while at home for a better understanding. The opportunity to ask questions was provided. Gloria Anderson denies that she or her family has experienced any of the following: Cough Fever >100.4 Runny Nose Sore Throat Difficulty breathing/ shortness of breath Travel in past 14 days- no

## 2018-08-12 LAB — NOVEL CORONAVIRUS, NAA (HOSP ORDER, SEND-OUT TO REF LAB; TAT 18-24 HRS): SARS-CoV-2, NAA: NOT DETECTED

## 2018-08-15 ENCOUNTER — Inpatient Hospital Stay (HOSPITAL_COMMUNITY)
Admission: AD | Admit: 2018-08-15 | Discharge: 2018-08-16 | DRG: 517 | Disposition: A | Discharge status: Home / Self Care | Payer: Medicare Other | Attending: Neurosurgery | Admitting: Neurosurgery

## 2018-08-15 ENCOUNTER — Encounter (HOSPITAL_COMMUNITY)
Admission: AD | Disposition: A | Discharge status: Home / Self Care | Payer: Self-pay | Source: Home / Self Care | Attending: Neurosurgery

## 2018-08-15 ENCOUNTER — Encounter (HOSPITAL_COMMUNITY): Payer: Self-pay | Admitting: Certified Registered"

## 2018-08-15 ENCOUNTER — Ambulatory Visit (HOSPITAL_COMMUNITY): Payer: Medicare Other | Admitting: Certified Registered"

## 2018-08-15 ENCOUNTER — Ambulatory Visit (HOSPITAL_COMMUNITY): Payer: Medicare Other | Admitting: Vascular Surgery

## 2018-08-15 ENCOUNTER — Ambulatory Visit (HOSPITAL_COMMUNITY): Payer: Medicare Other

## 2018-08-15 ENCOUNTER — Other Ambulatory Visit: Payer: Self-pay

## 2018-08-15 DIAGNOSIS — M353 Polymyalgia rheumatica: Secondary | ICD-10-CM | POA: Diagnosis present

## 2018-08-15 DIAGNOSIS — M48061 Spinal stenosis, lumbar region without neurogenic claudication: Secondary | ICD-10-CM | POA: Diagnosis present

## 2018-08-15 DIAGNOSIS — Z79890 Hormone replacement therapy (postmenopausal): Secondary | ICD-10-CM | POA: Diagnosis not present

## 2018-08-15 DIAGNOSIS — Z7983 Long term (current) use of bisphosphonates: Secondary | ICD-10-CM

## 2018-08-15 DIAGNOSIS — M1990 Unspecified osteoarthritis, unspecified site: Secondary | ICD-10-CM | POA: Diagnosis present

## 2018-08-15 DIAGNOSIS — I10 Essential (primary) hypertension: Secondary | ICD-10-CM | POA: Diagnosis present

## 2018-08-15 DIAGNOSIS — K219 Gastro-esophageal reflux disease without esophagitis: Secondary | ICD-10-CM | POA: Diagnosis present

## 2018-08-15 DIAGNOSIS — M5416 Radiculopathy, lumbar region: Secondary | ICD-10-CM | POA: Diagnosis present

## 2018-08-15 DIAGNOSIS — M48062 Spinal stenosis, lumbar region with neurogenic claudication: Principal | ICD-10-CM | POA: Diagnosis present

## 2018-08-15 DIAGNOSIS — E039 Hypothyroidism, unspecified: Secondary | ICD-10-CM | POA: Diagnosis present

## 2018-08-15 DIAGNOSIS — M810 Age-related osteoporosis without current pathological fracture: Secondary | ICD-10-CM | POA: Diagnosis present

## 2018-08-15 DIAGNOSIS — Z419 Encounter for procedure for purposes other than remedying health state, unspecified: Secondary | ICD-10-CM

## 2018-08-15 HISTORY — PX: LUMBAR LAMINECTOMY/DECOMPRESSION MICRODISCECTOMY: SHX5026

## 2018-08-15 SURGERY — LUMBAR LAMINECTOMY/DECOMPRESSION MICRODISCECTOMY 2 LEVELS
Anesthesia: General | Site: Back

## 2018-08-15 MED ORDER — BIOTIN 3 MG PO TABS: ORAL_TABLET | Freq: Every day | ORAL | Status: DC

## 2018-08-15 MED ORDER — ONDANSETRON HCL 4 MG/2ML IJ SOLN
INTRAMUSCULAR | Status: DC | PRN
  Administered 2018-08-15: 4 mg via INTRAVENOUS

## 2018-08-15 MED ORDER — ROCURONIUM BROMIDE 10 MG/ML (PF) SYRINGE
PREFILLED_SYRINGE | INTRAVENOUS | Status: AC
  Filled 2018-08-15: qty 20

## 2018-08-15 MED ORDER — FLEET ENEMA 7-19 GM/118ML RE ENEM: 1.0000 | ENEMA | Freq: Once | RECTAL | Status: DC | PRN

## 2018-08-15 MED ORDER — ACETAMINOPHEN 650 MG RE SUPP: 650.0000 mg | RECTAL | Status: DC | PRN

## 2018-08-15 MED ORDER — LIDOCAINE 2% (20 MG/ML) 5 ML SYRINGE
INTRAMUSCULAR | Status: DC | PRN
  Administered 2018-08-15: 40 mg via INTRAVENOUS

## 2018-08-15 MED ORDER — ALUM & MAG HYDROXIDE-SIMETH 200-200-20 MG/5ML PO SUSP: 30.0000 mL | Freq: Four times a day (QID) | ORAL | Status: DC | PRN

## 2018-08-15 MED ORDER — OMEGA-3 FATTY ACIDS 1000 MG PO CAPS: 1.0000 g | ORAL_CAPSULE | Freq: Every day | ORAL | Status: DC

## 2018-08-15 MED ORDER — LEVOTHYROXINE SODIUM 50 MCG PO TABS
50.0000 ug | ORAL_TABLET | Freq: Every day | ORAL | Status: DC
  Administered 2018-08-16: 50 ug via ORAL
  Filled 2018-08-15: qty 1
  Filled 2018-08-15: qty 2

## 2018-08-15 MED ORDER — METHOCARBAMOL 1000 MG/10ML IJ SOLN
500.0000 mg | Freq: Four times a day (QID) | INTRAVENOUS | Status: DC | PRN
  Filled 2018-08-15: qty 5

## 2018-08-15 MED ORDER — HYDROXYCHLOROQUINE SULFATE 200 MG PO TABS
200.0000 mg | ORAL_TABLET | Freq: Every day | ORAL | Status: DC
  Filled 2018-08-15: qty 1

## 2018-08-15 MED ORDER — EPHEDRINE SULFATE-NACL 50-0.9 MG/10ML-% IV SOSY
PREFILLED_SYRINGE | INTRAVENOUS | Status: DC | PRN
  Administered 2018-08-15: 10 mg via INTRAVENOUS

## 2018-08-15 MED ORDER — MORPHINE SULFATE (PF) 2 MG/ML IV SOLN: 2.0000 mg | INTRAVENOUS | Status: DC | PRN

## 2018-08-15 MED ORDER — BISACODYL 10 MG RE SUPP: 10.0000 mg | Freq: Every day | RECTAL | Status: DC | PRN

## 2018-08-15 MED ORDER — POLYETHYLENE GLYCOL 3350 17 G PO PACK: 17.0000 g | PACK | Freq: Every day | ORAL | Status: DC | PRN

## 2018-08-15 MED ORDER — ATORVASTATIN CALCIUM 10 MG PO TABS
10.0000 mg | ORAL_TABLET | Freq: Every day | ORAL | Status: DC
  Administered 2018-08-15: 10 mg via ORAL
  Filled 2018-08-15: qty 1

## 2018-08-15 MED ORDER — PROBIOTIC DAILY PO CAPS: ORAL_CAPSULE | Freq: Every day | ORAL | Status: DC

## 2018-08-15 MED ORDER — DEXAMETHASONE SODIUM PHOSPHATE 10 MG/ML IJ SOLN
INTRAMUSCULAR | Status: AC
  Filled 2018-08-15: qty 2

## 2018-08-15 MED ORDER — MENTHOL 3 MG MT LOZG: 1.0000 | LOZENGE | OROMUCOSAL | Status: DC | PRN

## 2018-08-15 MED ORDER — EPHEDRINE 5 MG/ML INJ
INTRAVENOUS | Status: AC
  Filled 2018-08-15: qty 10

## 2018-08-15 MED ORDER — FENTANYL CITRATE (PF) 100 MCG/2ML IJ SOLN: 25.0000 ug | INTRAMUSCULAR | Status: DC | PRN

## 2018-08-15 MED ORDER — ACETAMINOPHEN 325 MG PO TABS
ORAL_TABLET | ORAL | Status: AC
  Filled 2018-08-15: qty 2

## 2018-08-15 MED ORDER — LIDOCAINE 2% (20 MG/ML) 5 ML SYRINGE
INTRAMUSCULAR | Status: AC
  Filled 2018-08-15: qty 10

## 2018-08-15 MED ORDER — CEFAZOLIN SODIUM-DEXTROSE 2-4 GM/100ML-% IV SOLN
2.0000 g | INTRAVENOUS | Status: AC
  Administered 2018-08-15: 16:00:00 2 g via INTRAVENOUS
  Filled 2018-08-15: qty 100

## 2018-08-15 MED ORDER — SUCCINYLCHOLINE CHLORIDE 200 MG/10ML IV SOSY
PREFILLED_SYRINGE | INTRAVENOUS | Status: DC | PRN
  Administered 2018-08-15: 60 mg via INTRAVENOUS

## 2018-08-15 MED ORDER — SODIUM CHLORIDE 0.9% FLUSH: 3.0000 mL | INTRAVENOUS | Status: DC | PRN

## 2018-08-15 MED ORDER — THROMBIN 5000 UNITS EX SOLR
OROMUCOSAL | Status: DC | PRN
  Administered 2018-08-15: 5 mL via TOPICAL

## 2018-08-15 MED ORDER — BUPIVACAINE HCL (PF) 0.5 % IJ SOLN
INTRAMUSCULAR | Status: AC
  Filled 2018-08-15: qty 30

## 2018-08-15 MED ORDER — ONDANSETRON HCL 4 MG/2ML IJ SOLN
INTRAMUSCULAR | Status: AC
  Filled 2018-08-15: qty 4

## 2018-08-15 MED ORDER — DOCUSATE SODIUM 100 MG PO CAPS
100.0000 mg | ORAL_CAPSULE | Freq: Two times a day (BID) | ORAL | Status: DC
  Administered 2018-08-15: 100 mg via ORAL
  Filled 2018-08-15: qty 1

## 2018-08-15 MED ORDER — HYDRALAZINE HCL 25 MG PO TABS
25.0000 mg | ORAL_TABLET | Freq: Three times a day (TID) | ORAL | Status: DC | PRN
  Filled 2018-08-15: qty 1

## 2018-08-15 MED ORDER — HYDROCODONE-ACETAMINOPHEN 5-325 MG PO TABS: 2.0000 | ORAL_TABLET | ORAL | Status: DC | PRN

## 2018-08-15 MED ORDER — PANTOPRAZOLE SODIUM 40 MG PO TBEC
40.0000 mg | DELAYED_RELEASE_TABLET | Freq: Every day | ORAL | Status: DC
  Administered 2018-08-15: 40 mg via ORAL
  Filled 2018-08-15: qty 1

## 2018-08-15 MED ORDER — FENTANYL CITRATE (PF) 250 MCG/5ML IJ SOLN
INTRAMUSCULAR | Status: AC
  Filled 2018-08-15: qty 5

## 2018-08-15 MED ORDER — FENTANYL CITRATE (PF) 250 MCG/5ML IJ SOLN
INTRAMUSCULAR | Status: DC | PRN
  Administered 2018-08-15: 50 ug via INTRAVENOUS
  Administered 2018-08-15: 75 ug via INTRAVENOUS

## 2018-08-15 MED ORDER — SODIUM CHLORIDE 0.9 % IV SOLN
INTRAVENOUS | Status: DC | PRN
  Administered 2018-08-15: 16:00:00 20 ug/min via INTRAVENOUS

## 2018-08-15 MED ORDER — ONDANSETRON HCL 4 MG/2ML IJ SOLN
4.0000 mg | Freq: Four times a day (QID) | INTRAMUSCULAR | Status: DC | PRN
  Administered 2018-08-15: 4 mg via INTRAVENOUS
  Filled 2018-08-15: qty 2

## 2018-08-15 MED ORDER — DEXAMETHASONE SODIUM PHOSPHATE 10 MG/ML IJ SOLN
INTRAMUSCULAR | Status: DC | PRN
  Administered 2018-08-15: 5 mg via INTRAVENOUS

## 2018-08-15 MED ORDER — PROMETHAZINE HCL 25 MG/ML IJ SOLN: 6.2500 mg | INTRAMUSCULAR | Status: DC | PRN

## 2018-08-15 MED ORDER — SODIUM CHLORIDE 0.9 % IV SOLN: 250.0000 mL | INTRAVENOUS | Status: DC

## 2018-08-15 MED ORDER — 0.9 % SODIUM CHLORIDE (POUR BTL) OPTIME
TOPICAL | Status: DC | PRN
  Administered 2018-08-15: 1000 mL

## 2018-08-15 MED ORDER — PANTOPRAZOLE SODIUM 40 MG IV SOLR: 40.0000 mg | Freq: Every day | INTRAVENOUS | Status: DC

## 2018-08-15 MED ORDER — BIOTENE DRY MOUTH MT LIQD: 15.0000 mL | Freq: Three times a day (TID) | OROMUCOSAL | Status: DC | PRN

## 2018-08-15 MED ORDER — BUPIVACAINE HCL (PF) 0.5 % IJ SOLN
INTRAMUSCULAR | Status: DC | PRN
  Administered 2018-08-15: 5 mL

## 2018-08-15 MED ORDER — CHLORHEXIDINE GLUCONATE CLOTH 2 % EX PADS: 6.0000 | MEDICATED_PAD | Freq: Once | CUTANEOUS | Status: DC

## 2018-08-15 MED ORDER — RISAQUAD PO CAPS
1.0000 | ORAL_CAPSULE | Freq: Every day | ORAL | Status: DC
  Filled 2018-08-15: qty 1

## 2018-08-15 MED ORDER — POLYVINYL ALCOHOL 1.4 % OP SOLN
1.0000 [drp] | Freq: Two times a day (BID) | OPHTHALMIC | Status: DC
  Administered 2018-08-15: 1 [drp] via OPHTHALMIC
  Filled 2018-08-15: qty 15

## 2018-08-15 MED ORDER — PHENYLEPHRINE 40 MCG/ML (10ML) SYRINGE FOR IV PUSH (FOR BLOOD PRESSURE SUPPORT)
PREFILLED_SYRINGE | INTRAVENOUS | Status: AC
  Filled 2018-08-15: qty 20

## 2018-08-15 MED ORDER — LIDOCAINE-EPINEPHRINE 1 %-1:100000 IJ SOLN
INTRAMUSCULAR | Status: DC | PRN
  Administered 2018-08-15: 5 mL

## 2018-08-15 MED ORDER — PROPOFOL 10 MG/ML IV BOLUS
INTRAVENOUS | Status: AC
  Filled 2018-08-15: qty 20

## 2018-08-15 MED ORDER — PHENOL 1.4 % MT LIQD: 1.0000 | OROMUCOSAL | Status: DC | PRN

## 2018-08-15 MED ORDER — CEFAZOLIN SODIUM-DEXTROSE 2-4 GM/100ML-% IV SOLN
2.0000 g | Freq: Three times a day (TID) | INTRAVENOUS | Status: AC
  Administered 2018-08-15 – 2018-08-16 (×2): 2 g via INTRAVENOUS
  Filled 2018-08-15 (×2): qty 100

## 2018-08-15 MED ORDER — KCL IN DEXTROSE-NACL 20-5-0.45 MEQ/L-%-% IV SOLN: INTRAVENOUS | Status: DC

## 2018-08-15 MED ORDER — ONDANSETRON HCL 4 MG PO TABS: 4.0000 mg | ORAL_TABLET | Freq: Four times a day (QID) | ORAL | Status: DC | PRN

## 2018-08-15 MED ORDER — CARBOXYMETHYLCELL-HYPROMELLOSE 0.25-0.3 % OP GEL: Freq: Two times a day (BID) | OPHTHALMIC | Status: DC

## 2018-08-15 MED ORDER — SUCCINYLCHOLINE CHLORIDE 200 MG/10ML IV SOSY
PREFILLED_SYRINGE | INTRAVENOUS | Status: AC
  Filled 2018-08-15: qty 20

## 2018-08-15 MED ORDER — LIDOCAINE-EPINEPHRINE 1 %-1:100000 IJ SOLN
INTRAMUSCULAR | Status: AC
  Filled 2018-08-15: qty 1

## 2018-08-15 MED ORDER — SODIUM CHLORIDE 0.9% FLUSH
3.0000 mL | Freq: Two times a day (BID) | INTRAVENOUS | Status: DC
  Administered 2018-08-15: 3 mL via INTRAVENOUS

## 2018-08-15 MED ORDER — THROMBIN 5000 UNITS EX SOLR
CUTANEOUS | Status: AC
  Filled 2018-08-15: qty 10000

## 2018-08-15 MED ORDER — CARVEDILOL 3.125 MG PO TABS
3.1250 mg | ORAL_TABLET | Freq: Two times a day (BID) | ORAL | Status: DC
  Administered 2018-08-15: 3.125 mg via ORAL
  Filled 2018-08-15: qty 1

## 2018-08-15 MED ORDER — DOCUSATE SODIUM 100 MG PO CAPS: 100.0000 mg | ORAL_CAPSULE | Freq: Every day | ORAL | Status: DC

## 2018-08-15 MED ORDER — ACETAMINOPHEN 325 MG PO TABS
650.0000 mg | ORAL_TABLET | ORAL | Status: DC | PRN
  Administered 2018-08-15 – 2018-08-16 (×4): 650 mg via ORAL
  Filled 2018-08-15 (×3): qty 2

## 2018-08-15 MED ORDER — OMEGA-3-ACID ETHYL ESTERS 1 G PO CAPS
1.0000 g | ORAL_CAPSULE | Freq: Two times a day (BID) | ORAL | Status: DC
  Filled 2018-08-15: qty 1

## 2018-08-15 MED ORDER — OXYCODONE HCL 5 MG PO TABS
5.0000 mg | ORAL_TABLET | ORAL | Status: DC | PRN
  Administered 2018-08-15: 5 mg via ORAL
  Filled 2018-08-15: qty 1

## 2018-08-15 MED ORDER — LACTATED RINGERS IV SOLN
INTRAVENOUS | Status: DC | PRN
  Administered 2018-08-15 (×2): via INTRAVENOUS

## 2018-08-15 MED ORDER — CLONAZEPAM 0.5 MG PO TABS
0.2500 mg | ORAL_TABLET | Freq: Every day | ORAL | Status: DC
  Administered 2018-08-15: 0.25 mg via ORAL
  Filled 2018-08-15: qty 1

## 2018-08-15 MED ORDER — ZOLPIDEM TARTRATE 5 MG PO TABS: 5.0000 mg | ORAL_TABLET | Freq: Every evening | ORAL | Status: DC | PRN

## 2018-08-15 MED ORDER — GARLIC 2000 MG PO TBEC: 2000.0000 mg | DELAYED_RELEASE_TABLET | Freq: Every day | ORAL | Status: DC

## 2018-08-15 MED ORDER — METHOCARBAMOL 500 MG PO TABS
500.0000 mg | ORAL_TABLET | Freq: Four times a day (QID) | ORAL | Status: DC | PRN
  Administered 2018-08-15: 500 mg via ORAL
  Filled 2018-08-15: qty 1

## 2018-08-15 MED ORDER — PROPOFOL 10 MG/ML IV BOLUS
INTRAVENOUS | Status: DC | PRN
  Administered 2018-08-15: 70 mg via INTRAVENOUS

## 2018-08-15 SURGICAL SUPPLY — 63 items
ADH SKN CLS APL DERMABOND .7 (GAUZE/BANDAGES/DRESSINGS) ×1
BLADE CLIPPER SURG (BLADE) IMPLANT
BUR MATCHSTICK NEURO 3.0 LAGG (BURR) ×3 IMPLANT
BUR ROUND FLUTED 5 RND (BURR) ×2 IMPLANT
BUR ROUND FLUTED 5MM RND (BURR) ×1
CANISTER SUCT 3000ML PPV (MISCELLANEOUS) ×3 IMPLANT
CARTRIDGE OIL MAESTRO DRILL (MISCELLANEOUS) ×1 IMPLANT
COVER WAND RF STERILE (DRAPES) ×1 IMPLANT
DECANTER SPIKE VIAL GLASS SM (MISCELLANEOUS) ×3 IMPLANT
DERMABOND ADVANCED (GAUZE/BANDAGES/DRESSINGS) ×2
DERMABOND ADVANCED .7 DNX12 (GAUZE/BANDAGES/DRESSINGS) ×1 IMPLANT
DIFFUSER DRILL AIR PNEUMATIC (MISCELLANEOUS) ×3 IMPLANT
DRAPE LAPAROTOMY 100X72X124 (DRAPES) ×3 IMPLANT
DRAPE MICROSCOPE LEICA (MISCELLANEOUS) ×1 IMPLANT
DRAPE SURG 17X23 STRL (DRAPES) ×3 IMPLANT
DRSG OPSITE POSTOP 4X6 (GAUZE/BANDAGES/DRESSINGS) ×2 IMPLANT
DURAPREP 26ML APPLICATOR (WOUND CARE) ×3 IMPLANT
ELECT REM PT RETURN 9FT ADLT (ELECTROSURGICAL) ×3
ELECTRODE REM PT RTRN 9FT ADLT (ELECTROSURGICAL) ×1 IMPLANT
GAUZE 4X4 16PLY RFD (DISPOSABLE) IMPLANT
GAUZE SPONGE 4X4 12PLY STRL (GAUZE/BANDAGES/DRESSINGS) IMPLANT
GLOVE BIO SURGEON STRL SZ 6.5 (GLOVE) ×2 IMPLANT
GLOVE BIO SURGEON STRL SZ8 (GLOVE) ×3 IMPLANT
GLOVE BIO SURGEONS STRL SZ 6.5 (GLOVE) ×2
GLOVE BIOGEL PI IND STRL 6.5 (GLOVE) IMPLANT
GLOVE BIOGEL PI IND STRL 8 (GLOVE) ×1 IMPLANT
GLOVE BIOGEL PI IND STRL 8.5 (GLOVE) ×1 IMPLANT
GLOVE BIOGEL PI INDICATOR 6.5 (GLOVE) ×2
GLOVE BIOGEL PI INDICATOR 8 (GLOVE) ×6
GLOVE BIOGEL PI INDICATOR 8.5 (GLOVE) ×2
GLOVE ECLIPSE 7.5 STRL STRAW (GLOVE) ×6 IMPLANT
GLOVE ECLIPSE 8.0 STRL XLNG CF (GLOVE) ×3 IMPLANT
GLOVE ECLIPSE 9.0 STRL (GLOVE) ×2 IMPLANT
GLOVE EXAM NITRILE XL STR (GLOVE) IMPLANT
GOWN STRL REUS W/ TWL LRG LVL3 (GOWN DISPOSABLE) IMPLANT
GOWN STRL REUS W/ TWL XL LVL3 (GOWN DISPOSABLE) ×1 IMPLANT
GOWN STRL REUS W/TWL 2XL LVL3 (GOWN DISPOSABLE) ×5 IMPLANT
GOWN STRL REUS W/TWL LRG LVL3 (GOWN DISPOSABLE) ×3
GOWN STRL REUS W/TWL XL LVL3 (GOWN DISPOSABLE) ×4
HEMOSTAT POWDER KIT SURGIFOAM (HEMOSTASIS) ×3 IMPLANT
KIT BASIN OR (CUSTOM PROCEDURE TRAY) ×3 IMPLANT
KIT TURNOVER KIT B (KITS) ×3 IMPLANT
NDL HYPO 18GX1.5 BLUNT FILL (NEEDLE) IMPLANT
NDL HYPO 25X1 1.5 SAFETY (NEEDLE) ×1 IMPLANT
NDL SPNL 18GX3.5 QUINCKE PK (NEEDLE) ×1 IMPLANT
NEEDLE HYPO 18GX1.5 BLUNT FILL (NEEDLE) IMPLANT
NEEDLE HYPO 25X1 1.5 SAFETY (NEEDLE) ×3 IMPLANT
NEEDLE SPNL 18GX3.5 QUINCKE PK (NEEDLE) ×3 IMPLANT
NS IRRIG 1000ML POUR BTL (IV SOLUTION) ×3 IMPLANT
OIL CARTRIDGE MAESTRO DRILL (MISCELLANEOUS) ×3
PACK LAMINECTOMY NEURO (CUSTOM PROCEDURE TRAY) ×3 IMPLANT
PAD ARMBOARD 7.5X6 YLW CONV (MISCELLANEOUS) ×9 IMPLANT
RUBBERBAND STERILE (MISCELLANEOUS) ×2 IMPLANT
SPONGE SURGIFOAM ABS GEL SZ50 (HEMOSTASIS) IMPLANT
STAPLER SKIN PROX WIDE 3.9 (STAPLE) IMPLANT
SUT VIC AB 0 CT1 18XCR BRD8 (SUTURE) ×1 IMPLANT
SUT VIC AB 0 CT1 8-18 (SUTURE) ×2
SUT VIC AB 2-0 CT1 18 (SUTURE) ×3 IMPLANT
SUT VIC AB 3-0 SH 8-18 (SUTURE) ×3 IMPLANT
SYR 5ML LL (SYRINGE) IMPLANT
TOWEL GREEN STERILE (TOWEL DISPOSABLE) ×3 IMPLANT
TOWEL GREEN STERILE FF (TOWEL DISPOSABLE) ×3 IMPLANT
WATER STERILE IRR 1000ML POUR (IV SOLUTION) ×3 IMPLANT

## 2018-08-15 NOTE — Interval H&P Note (Signed)
History and Physical Interval Note:  08/15/2018 2:41 PM  Gloria Anderson  has presented today for surgery, with the diagnosis of Degenerative lumbar spinal stenosis.  The various methods of treatment have been discussed with the patient and family. After consideration of risks, benefits and other options for treatment, the patient has consented to  Procedure(s) with comments: Lumbar 3 to Lumbar 5 Decompressive lumbar laminectomy (N/A) - Lumbar 3 to Lumbar 5 Decompressive lumbar laminectomy as a surgical intervention.  The patient's history has been reviewed, patient examined, no change in status, stable for surgery.  I have reviewed the patient's chart and labs.  Questions were answered to the patient's satisfaction.     Peggyann Shoals

## 2018-08-15 NOTE — Op Note (Signed)
08/15/2018  5:12 PM  PATIENT:  Gloria Anderson  85 y.o. female  PRE-OPERATIVE DIAGNOSIS:  Degenerative lumbar spinal stenosis, lumbar radiculopathy, lumbago L 3 - S 1 levels  POST-OPERATIVE DIAGNOSIS:  Degenerative lumbar spinal stenosis, lumbar radiculopathy, lumbago L 3 - S 1 levels  PROCEDURE:  Procedure(s): Lumbar three to Lumbar five Decompressive lumbar laminectomy (N/A) (Decompression of L 3, L 4, L 5, S 1 nerve roots bilaterally  SURGEON:  Surgeon(s) and Role:    Erline Levine, MD - Primary  PHYSICIAN ASSISTANT:   ASSISTANTS: P[place], RN   ANESTHESIA:   general  EBL:  200 mL   BLOOD ADMINISTERED:none  DRAINS: none   LOCAL MEDICATIONS USED:  MARCAINE    and LIDOCAINE   SPECIMEN:  No Specimen  DISPOSITION OF SPECIMEN:  N/A  COUNTS:  YES  TOURNIQUET:  * No tourniquets in log *   DICTATION: Patient has severe spinal stenosis at L3 -S 1 with significant leg weakness and intractable pain. It was elected to take her to surgery for L3 through S1 laminectomy.  Procedure: Patient was brought to the operating room and following the smooth and uncomplicated induction of general endotracheal anesthesia she was placed in a prone position on the Wilson frame. Low back was prepped and draped in the usual sterile fashion with betadine scrub and DuraPrep. Area of planned incision was infiltrated with local lidocaine. Incision was made in the midline and carried to the lumbodorsal fascia which was incised on the right side of midline. Subperiosteal dissection was performed exposing what was felt to be L 3 - L 5 levels. Intraoperative x-ray demonstrated marker probes at L3-4 and L 4 -5.  A total laminectomy of L 3,  L4 and L5 was performed with leksell, then a high-speed drill and completed with Kerrison rongeurs and generous foraminotomies were performed to decompress the L3, L 4, L5, and S1 nerves bilaterally. Ligamentum flavum was detached and removed in a piecemeal fashion and both  L4 and L 5 nerve roots were decompressed laterally with removal of the superior aspect of the facet and ligamentum causing nerve root compression.  At this point it was felt that all neural elements were well decompressed. The wound was then irrigated with saline. Hemostasis was assured with Surgifoam.  The lumbodorsal fascia was closed with 0 Vicryl sutures the subcutaneous tissues reapproximated 2-0 Vicryl inverted sutures and the skin edges were reapproximated with 3-0 Vicryl subcuticular stitch. The wound is dressed with Dermabond and an occlusive dressing. Patient was extubated in the operating room and taken to recovery in stable and satisfactory condition having tolerated his operation well counts were correct at the end of the case.    PLAN OF CARE: Admit to inpatient   PATIENT DISPOSITION:  PACU - hemodynamically stable.   Delay start of Pharmacological VTE agent (>24hrs) due to surgical blood loss or risk of bleeding: yes

## 2018-08-15 NOTE — Transfer of Care (Signed)
Immediate Anesthesia Transfer of Care Note  Patient: Gloria Anderson  Procedure(s) Performed: Lumbar three to Lumbar five Decompressive lumbar laminectomy (N/A Back)  Patient Location: PACU  Anesthesia Type:General  Level of Consciousness: drowsy and patient cooperative  Airway & Oxygen Therapy: Patient Spontanous Breathing and Patient connected to nasal cannula oxygen  Post-op Assessment: Report given to RN and Post -op Vital signs reviewed and stable  Post vital signs: Reviewed and stable  Last Vitals:  Vitals Value Taken Time  BP 168/83 08/15/18 1716  Temp    Pulse 77 08/15/18 1718  Resp 12 08/15/18 1718  SpO2 100 % 08/15/18 1718  Vitals shown include unvalidated device data.  Last Pain:  Vitals:   08/15/18 1258  TempSrc:   PainSc: 3       Patients Stated Pain Goal: 2 (11/02/99 4996)  Complications: No apparent anesthesia complications

## 2018-08-15 NOTE — Anesthesia Preprocedure Evaluation (Signed)
Anesthesia Evaluation  Patient identified by MRN, date of birth, ID band Patient awake    Reviewed: Allergy & Precautions, NPO status , Patient's Chart, lab work & pertinent test results  Airway Mallampati: II  TM Distance: >3 FB     Dental  (+) Dental Advisory Given   Pulmonary neg pulmonary ROS,    breath sounds clear to auscultation       Cardiovascular hypertension, Pt. on medications  Rhythm:Regular Rate:Normal     Neuro/Psych  Neuromuscular disease    GI/Hepatic Neg liver ROS, GERD  ,  Endo/Other  Hypothyroidism   Renal/GU negative Renal ROS     Musculoskeletal  (+) Arthritis ,   Abdominal   Peds  Hematology  (+) anemia ,   Anesthesia Other Findings   Reproductive/Obstetrics                             Lab Results  Component Value Date   WBC 5.3 08/11/2018   HGB 11.6 (L) 08/11/2018   HCT 34.6 (L) 08/11/2018   MCV 96.9 08/11/2018   PLT 183 08/11/2018   Lab Results  Component Value Date   CREATININE 1.26 (H) 08/11/2018   BUN 22 08/11/2018   NA 140 08/11/2018   K 4.6 08/11/2018   CL 105 08/11/2018   CO2 25 08/11/2018    Anesthesia Physical Anesthesia Plan  ASA: III  Anesthesia Plan: General   Post-op Pain Management:    Induction: Intravenous  PONV Risk Score and Plan: 3 and Dexamethasone, Ondansetron and Treatment may vary due to age or medical condition  Airway Management Planned: Oral ETT  Additional Equipment:   Intra-op Plan:   Post-operative Plan: Extubation in OR  Informed Consent: I have reviewed the patients History and Physical, chart, labs and discussed the procedure including the risks, benefits and alternatives for the proposed anesthesia with the patient or authorized representative who has indicated his/her understanding and acceptance.     Dental advisory given  Plan Discussed with: CRNA  Anesthesia Plan Comments:          Anesthesia Quick Evaluation

## 2018-08-15 NOTE — Progress Notes (Deleted)
Office Visit Note  Patient: Gloria Anderson             Date of Birth: 02-25-1934           MRN: [id number]             PCP: [name], MD Referring: [name], MD Visit Date: 08/24/2018 Occupation: @GUAROCC @  Subjective:  No chief complaint on file.   History of Present Illness: Gloria Anderson is a 85 y.o. female ***   Activities of Daily Living:  Patient reports morning stiffness for *** {minute/hour:19697}.   Patient {ACTIONS;DENIES/REPORTS:21021675::"Denies"} nocturnal pain.  Difficulty dressing/grooming: {ACTIONS;DENIES/REPORTS:21021675::"Denies"} Difficulty climbing stairs: {ACTIONS;DENIES/REPORTS:21021675::"Denies"} Difficulty getting out of chair: {ACTIONS;DENIES/REPORTS:21021675::"Denies"} Difficulty using hands for taps, buttons, cutlery, and/or writing: {ACTIONS;DENIES/REPORTS:21021675::"Denies"}  No Rheumatology ROS completed.   PMFS History:  Patient Active Problem List   Diagnosis Date Noted  . Degenerative lumbar spinal stenosis 08/15/2018  . Memory difficulty 07/26/2017  . Other parasomnia 07/26/2017  . Anemia 06/02/2017  . Osteopenia 06/02/2017  . Spinal stenosis 06/02/2017  . Iron deficiency anemia secondary to inadequate dietary iron intake 08/05/2016  . Fatigue associated with anemia 08/05/2016  . Primary osteoarthritis of both feet 02/04/2016  . Essential hypertension 02/04/2016  . PMR (polymyalgia rheumatica) (HCC) 02/03/2016  . Osteoarthritis, hand 02/03/2016  . Age-related osteoporosis without current pathological fracture 02/03/2016  . DDD (degenerative disc disease), lumbar 02/03/2016  . High risk medication use 02/03/2016  . 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 Cerebral cavernous malformation type 1 12/04/2013  . Night terrors, adult 09/26/2013  . HEMORRHOIDS-EXTERNAL 09/19/2009  . GERD 09/19/2009  . CONSTIPATION 09/19/2009  . DYSPHAGIA 09/19/2009  . PERSONAL HISTORY OF  COLONIC POLYPS 09/19/2009    Past Medical History:  Diagnosis Date  . Arthritis   . Complication of anesthesia   . Constipation   . Depression   . GERD (gastroesophageal reflux disease)   . Hypertension   . Hypothyroidism   . Osteoporosis   . Pneumonia 2018  . Polymyalgia ([place])   . Polymyalgia rheumatica ([place])   . PONV (postoperative nausea and vomiting)     Family History  Problem Relation Age of Onset  . CVA Father   . Tuberculosis Mother   . Lung cancer Brother    Past Surgical History:  Procedure Laterality Date  . COLONOSCOPY W/ POLYPECTOMY    . EYE SURGERY     both cataracts  . NASAL SINUS SURGERY    . SHOULDER SURGERY Right 2007   repair  . TONSILLECTOMY    . TRIGGER FINGER RELEASE  03/19/2011   Procedure:  left middle finger RELEASE TRIGGER FINGER/A-1 PULLEY;  Surgeon: Cammie Sickle., MD;  Location: [facility];  Service: Orthopedics;  Laterality: Right;  Procedure:  Release Right Long and Ring Trigger Fingers, Release Left Long Trigger Finger, Injection Left Long Proximal Phalangeal Joint  . TRIGGER FINGER RELEASE Right    Ring finger, middle finger  . UPPER GASTROINTESTINAL ENDOSCOPY     Social History   Social History Narrative   Patient is widowed.   Patient has two children.   Patient does not drink any caffeine.    Patient has a high school education.   Patient is right-handed.            Immunization History  Administered Date(s) Administered  . Influenza, High Dose Seasonal PF 12/19/2016  . Zoster Recombinat (Shingrix) 08/01/2017, 10/10/2017     Objective: Vital Signs: There were  no vitals taken for this visit.   Physical Exam   Musculoskeletal Exam: ***  CDAI Exam: CDAI Score: - Patient Global: -; Provider Global: - Swollen: -; Tender: - Joint Exam   No joint exam has been documented for this visit   There is currently no information documented on the homunculus. Go to the Rheumatology activity and complete the  homunculus joint exam.  Investigation: No additional findings.  Imaging: No results found.  Recent Labs: Lab Results  Component Value Date   WBC 5.3 08/11/2018   HGB 11.6 (L) 08/11/2018   PLT 183 08/11/2018   NA 140 08/11/2018   K 4.6 08/11/2018   CL 105 08/11/2018   CO2 25 08/11/2018   GLUCOSE 110 (H) 08/11/2018   BUN 22 08/11/2018   CREATININE 1.26 (H) 08/11/2018   BILITOT 0.9 03/23/2018   AST 34 03/23/2018   ALT 28 03/23/2018   PROT 6.8 03/23/2018   CALCIUM 9.5 08/11/2018   GFRAA 46 (L) 08/11/2018    Speciality Comments: PLQ eye exam: 10/11/2017 normal. Dr. Gershon Crane. Follow up in 1 year.  Procedures:  No procedures performed Allergies: Patient has no known allergies.   Assessment / Plan:     Visit Diagnoses: No diagnosis found.   Orders: No orders of the defined types were placed in this encounter.  No orders of the defined types were placed in this encounter.   Face-to-face time spent with patient was *** minutes. Greater than 50% of time was spent in counseling and coordination of care.  Follow-Up Instructions: No follow-ups on file.   Ofilia Neas, PA-C  Note - This record has been created using Dragon software.  Chart creation errors have been sought, but may not always  have been located. Such creation errors do not reflect on  the standard of medical care.

## 2018-08-15 NOTE — Brief Op Note (Signed)
08/15/2018  5:12 PM  PATIENT:  Gloria Anderson  85 y.o. female  PRE-OPERATIVE DIAGNOSIS:  Degenerative lumbar spinal stenosis, lumbar radiculopathy, lumbago L 3 - S 1 levels  POST-OPERATIVE DIAGNOSIS:  Degenerative lumbar spinal stenosis, lumbar radiculopathy, lumbago L 3 - S 1 levels  PROCEDURE:  Procedure(s): Lumbar three to Lumbar five Decompressive lumbar laminectomy (N/A) (Decompression of L 3, L 4, L 5, S 1 nerve roots bilaterally  SURGEON:  Surgeon(s) and Role:    Erline Levine, MD - Primary  PHYSICIAN ASSISTANT:   ASSISTANTS: Poteat, RN   ANESTHESIA:   general  EBL:  200 mL   BLOOD ADMINISTERED:none  DRAINS: none   LOCAL MEDICATIONS USED:  MARCAINE    and LIDOCAINE   SPECIMEN:  No Specimen  DISPOSITION OF SPECIMEN:  N/A  COUNTS:  YES  TOURNIQUET:  * No tourniquets in log *   DICTATION: Patient has severe spinal stenosis at L3 -S 1 with significant leg weakness and intractable pain. It was elected to take her to surgery for L3 through S1 laminectomy.  Procedure: Patient was brought to the operating room and following the smooth and uncomplicated induction of general endotracheal anesthesia she was placed in a prone position on the Wilson frame. Low back was prepped and draped in the usual sterile fashion with betadine scrub and DuraPrep. Area of planned incision was infiltrated with local lidocaine. Incision was made in the midline and carried to the lumbodorsal fascia which was incised on the right side of midline. Subperiosteal dissection was performed exposing what was felt to be L 3 - L 5 levels. Intraoperative x-ray demonstrated marker probes at L3-4 and L 4 -5.  A total laminectomy of L 3,  L4 and L5 was performed with leksell, then a high-speed drill and completed with Kerrison rongeurs and generous foraminotomies were performed to decompress the L3, L 4, L5, and S1 nerves bilaterally. Ligamentum flavum was detached and removed in a piecemeal fashion and both  L4 and L 5 nerve roots were decompressed laterally with removal of the superior aspect of the facet and ligamentum causing nerve root compression.  At this point it was felt that all neural elements were well decompressed. The wound was then irrigated with saline. Hemostasis was assured with Surgifoam.  The lumbodorsal fascia was closed with 0 Vicryl sutures the subcutaneous tissues reapproximated 2-0 Vicryl inverted sutures and the skin edges were reapproximated with 3-0 Vicryl subcuticular stitch. The wound is dressed with Dermabond and an occlusive dressing. Patient was extubated in the operating room and taken to recovery in stable and satisfactory condition having tolerated his operation well counts were correct at the end of the case.    PLAN OF CARE: Admit to inpatient   PATIENT DISPOSITION:  PACU - hemodynamically stable.   Delay start of Pharmacological VTE agent (>24hrs) due to surgical blood loss or risk of bleeding: yes

## 2018-08-15 NOTE — Anesthesia Procedure Notes (Signed)
Procedure Name: Intubation Date/Time: 08/15/2018 3:27 PM Performed by: Imagene Riches, CRNA Pre-anesthesia Checklist: Patient identified, Emergency Drugs available, Suction available and Patient being monitored Patient Re-evaluated:Patient Re-evaluated prior to induction Oxygen Delivery Method: Circle System Utilized Preoxygenation: Pre-oxygenation with 100% oxygen Induction Type: IV induction Laryngoscope Size: Miller and 2 Grade View: Grade I Tube type: Oral Tube size: 7.0 mm Number of attempts: 1 Airway Equipment and Method: Stylet and Oral airway Placement Confirmation: ETT inserted through vocal cords under direct vision,  positive ETCO2 and breath sounds checked- equal and bilateral Secured at: 22 cm Tube secured with: Tape Dental Injury: Teeth and Oropharynx as per pre-operative assessment

## 2018-08-15 NOTE — Progress Notes (Signed)
Awake, alert, conversant.  MAEW with good strength.  Patient is doing well.  I discussed situation with her son, Gerald Stabs.

## 2018-08-16 ENCOUNTER — Encounter (HOSPITAL_COMMUNITY): Payer: Self-pay | Admitting: Neurosurgery

## 2018-08-16 NOTE — Evaluation (Signed)
Occupational Therapy Evaluation Patient Details Name: Gloria Anderson MRN: [id number] DOB: [birth year] Today's Date: 08/16/2018    History of Present Illness Pt is an 85 yo female. Pt with lumbar spinal stenosis L3-4 and L4-5. On 6/16 pt underwent lumbar 3-5 decompressive lumbar laminectomy.   Clinical Impression   PTA, pt was living with alone and was independent; pt planning on dc to son's home for increased support. Currently, pt requires Supervision for ADLs and functional mobility. Provided education and handout on back precautions, compensatory techniques for LB ADLs with AE, toileting, and functional transfers; pt demonstrated understanding. Answered all pt questions. Recommend dc home once medically stable per physician. All acute OT needs met and will sign off. Thank you.    Follow Up Recommendations  No OT follow up;Supervision - Intermittent    Equipment Recommendations  None recommended by OT    Recommendations for Other Services PT consult     Precautions / Restrictions Precautions Precautions: Back Precaution Booklet Issued: Yes (comment) Precaution Comments: Pt recalling 3/3 back precautions and demonstrating understanding of compensatory techniques for ADLs Required Braces or Orthoses: Other Brace Other Brace: No brace per order Restrictions Weight Bearing Restrictions: No      Mobility Bed Mobility Overal bed mobility: Needs Assistance Bed Mobility: Rolling;Sidelying to Sit;Sit to Sidelying Rolling: Supervision Sidelying to sit: Supervision     Sit to sidelying: Supervision General bed mobility comments: In recliner upon arrival  Transfers Overall transfer level: Needs assistance Equipment used: None Transfers: Sit to/from Stand Sit to Stand: Supervision;Min guard         General transfer comment: verbal cues for hand placement and to contract abdominal muscles    Balance Overall balance assessment: Mild deficits observed, not formally  tested                                         ADL either performed or assessed with clinical judgement   ADL Overall ADL's : Needs assistance/impaired                                       General ADL Comments: Pt performing ADLs and functional mobility at Supervision level. Educating pt on back precautions, compensatory techniques for LB ADLs with AE, functional transfers, and toileting. Pt demonstrated and verbalized understanding. Hand outs provided for back precautions and AE.      Vision Baseline Vision/History: Wears glasses Wears Glasses: At all times Patient Visual Report: No change from baseline       Perception     Praxis      Pertinent Vitals/Pain Pain Assessment: 0-10 Pain Score: 2  Pain Location: back Pain Descriptors / Indicators: Sore Pain Intervention(s): Monitored during session;Limited activity within patient's tolerance;Repositioned     Hand Dominance Right   Extremity/Trunk Assessment Upper Extremity Assessment Upper Extremity Assessment: Overall WFL for tasks assessed   Lower Extremity Assessment Lower Extremity Assessment: Defer to PT evaluation   Cervical / Trunk Assessment Cervical / Trunk Assessment: Other exceptions Cervical / Trunk Exceptions: s/p back surgery   Communication Communication Communication: No difficulties   Cognition Arousal/Alertness: Awake/alert Behavior During Therapy: WFL for tasks assessed/performed Overall Cognitive Status: Within Functional Limits for tasks assessed  General Comments: Pt with some decreased problem solving as seen during ADLs such as pt attempting to use reaching for each dressing task. Reports that she doesn't do well with pain medication.    General Comments  incision covered with dressing    Exercises     Shoulder Instructions      Home Living Family/patient expects to be discharged to:: Private  residence Living Arrangements: Alone Available Help at Discharge: Family;Available 24 hours/day Type of Home: House Home Access: Stairs to enter [employer] of Steps: 5 Entrance Stairs-Rails: Left Home Layout: One level     Bathroom Shower/Tub: Occupational psychologist: Standard     Home Equipment: None   Additional Comments: Planning on dc to son's home and will have 24/7 with son and daughter-in-law (who is a Therapist, sports)      Prior Functioning/Environment Level of Independence: Independent        Comments: ADLs and IADLs including driving. Works out at [employer]. Neighbors and son have been assisting with community errands due to COVID        OT Problem List: Decreased activity tolerance;Impaired balance (sitting and/or standing);Decreased knowledge of use of DME or AE;Decreased knowledge of precautions;Decreased cognition;Pain      OT Treatment/Interventions:      OT Goals(Current goals can be found in the care plan section) Acute Rehab OT Goals Patient Stated Goal: "Go to my son's home" OT Goal Formulation: All assessment and education complete, DC therapy  OT Frequency:     Barriers to D/C:            Co-evaluation              AM-PAC OT "6 Clicks" Daily Activity     Outcome Measure Help from another person eating meals?: None Help from another person taking care of personal grooming?: None Help from another person toileting, which includes using toliet, bedpan, or urinal?: None Help from another person bathing (including washing, rinsing, drying)?: None Help from another person to put on and taking off regular upper body clothing?: None Help from another person to put on and taking off regular lower body clothing?: None 6 Click Score: 24   End of Session Nurse Communication: Mobility status  Activity Tolerance: Patient tolerated treatment well Patient left: in chair;with call bell/phone within reach  OT Visit Diagnosis: Unsteadiness  on feet (R26.81);Other abnormalities of gait and mobility (R26.89);Muscle weakness (generalized) (M62.81);Pain Pain - part of body: (Back)                Time: 2878-6767 OT Time Calculation (min): 31 min Charges:  OT General Charges $OT Visit: 1 Visit OT Evaluation $OT Eval Low Complexity: 1 Low OT Treatments $Self Care/Home Management : 8-22 mins  Jaidynn Balster MSOT, OTR/L Acute Rehab Pager: [phone] Office: [place] 08/16/2018, 9:17 AM

## 2018-08-16 NOTE — Progress Notes (Signed)
Pt doing well. Pt given D/C instructions with verbal understanding. I also spoke with Pt's family about D/C care when I D/C'd Pt. Rx was sent to Pt's pharmacy by MD. Pt's incision is clean and dry with no sign of infection. Pt's IV was removed prior to D/C. Pt D/C'd home via wheelchair @ 1030 per MD order. Pt is stable @ D/C and has no other needs at this time. Holli Humbles, RN

## 2018-08-16 NOTE — Anesthesia Postprocedure Evaluation (Signed)
Anesthesia Post Note  Patient: Gloria Anderson  Procedure(s) Performed: Lumbar three to Lumbar five Decompressive lumbar laminectomy (N/A Back)     Patient location during evaluation: PACU Anesthesia Type: General Level of consciousness: awake and alert Pain management: pain level controlled Vital Signs Assessment: post-procedure vital signs reviewed and stable Respiratory status: spontaneous breathing, nonlabored ventilation, respiratory function stable and patient connected to nasal cannula oxygen Cardiovascular status: blood pressure returned to baseline and stable Postop Assessment: no apparent nausea or vomiting Anesthetic complications: no    Last Vitals:  Vitals:   08/16/18 0449 08/16/18 0715  BP: (!) 147/71 (!) 122/48  Pulse: 81 80  Resp:  18  Temp: 36.6 C 36.5 C  SpO2: 98% 100%    Last Pain:  Vitals:   08/16/18 0800  TempSrc:   PainSc: 3                  Tiajuana Amass

## 2018-08-16 NOTE — Discharge Summary (Signed)
Physician Discharge Summary  Patient ID: Gloria Anderson MRN: [id number] DOB/AGE: 83-May-1935 83 y.o.  Admit date: 08/15/2018 Discharge date: 08/16/2018  Admission Diagnoses: Degenerative lumbar spinal stenosis, lumbar radiculopathy, lumbago L 3 - S 1 levels    Discharge Diagnoses: Degenerative lumbar spinal stenosis, lumbar radiculopathy, lumbago L 3 - S 1 levels s/p Lumbar three to Lumbar five Decompressive lumbar laminectomy (N/A) (Decompression of L 3, L 4, L 5, S 1 nerve roots bilaterally    Active Problems:   Degenerative lumbar spinal stenosis   Discharged Condition: good  Hospital Course: Gloria Anderson was admitted for surgery with dx lumbar stenosis and radiculopathy. Following uncomplicated decompressive laminectomy L3-S1, she recovered nicely and transferred to [facility] for nursing care and therapies. She is mobilizing well without pain.  Consults: None  Significant Diagnostic Studies: radiology: X-Ray: intra-op  Treatments: surgery: Lumbar three to Lumbar five Decompressive lumbar laminectomy (N/A) (Decompression of L 3, L 4, L 5, S 1 nerve roots bilaterally    Discharge Exam: Blood pressure (!) 122/48, pulse 80, temperature 97.7 F (36.5 C), temperature source Oral, resp. rate 18, height 5\' 6"  (1.676 m), weight 54.4 kg, SpO2 100 %. Alert, conversant, working with Therapy. Reports no pain at present. Good strength BLE. Incision without erythema, welling, or drainage beneath honeycomb and Dermabond. Ambulated last nighth and this am.    Disposition:  Discharge to home. Pt declines pain med rx for home; tells nursing she takes Tylenol OTC prn. She already has f/u appt scheduled.    Discharge Instructions    Diet - low sodium heart healthy   Complete by: As directed    Incentive spirometry RT   Complete by: As directed    Increase activity slowly   Complete by: As directed      Allergies as of 08/16/2018   No Known Allergies      Medication List    TAKE these medications   alendronate 70 MG tablet Commonly known as: FOSAMAX TAKE 1 TABLET BY MOUTH 1  TIME WEEKLY WITH A FULL  GLASS OF WATER ON AN EMPTY  STOMACH   antiseptic oral rinse Liqd 15 mLs by Mouth Rinse route 3 (three) times daily as needed for dry mouth.   atorvastatin 10 MG tablet Commonly known as: LIPITOR Take 10 mg by mouth daily.   BIOTIN FORTE PO Take 1 tablet by mouth daily.   carvedilol 3.125 MG tablet Commonly known as: COREG TAKE 1 TABLET BY MOUTH TWO  TIMES DAILY What changed: when to take this   CITRACAL PO Take 1,200 mg by mouth daily.   clonazePAM 0.5 MG tablet Commonly known as: KLONOPIN Take half tablet at bedtime as needed (0.25 MG) What changed:   how much to take  how to take this  when to take this  additional instructions   fish oil-omega-3 fatty acids 1000 MG capsule Take 1 g by mouth daily.   Garlic 0258 MG Tbec Take 2,000 mg by mouth daily.   GENTEAL OP Place 1 drop into both eyes 2 (two) times daily.   GERITOL PO Take 1 tablet by mouth daily.   hydrALAZINE 25 MG tablet Commonly known as: APRESOLINE Take 25 mg by mouth 3 (three) times daily as needed (blood pressure >150).   hydroxychloroquine 200 MG tablet Commonly known as: PLAQUENIL TAKE 1 TABLET BY MOUTH  DAILY   levothyroxine 50 MCG tablet Commonly known as: SYNTHROID Take 50 mcg by mouth daily before breakfast.   omeprazole 20 MG capsule Commonly known  as: PRILOSEC Take 20 mg by mouth daily.   polyethylene glycol 17 g packet Commonly known as: MIRALAX / GLYCOLAX Take 17 g by mouth daily as needed for moderate constipation.   PROBIOTIC DAILY PO Take 1 tablet by mouth daily.   STOOL SOFTENER PO Take 100 mg by mouth daily.        Signed: Peggyann Shoals, MD 08/16/2018, 8:26 AM

## 2018-08-16 NOTE — Progress Notes (Addendum)
Subjective: Patient reports "I'm not hurting"  Objective: Vital signs in last 24 hours: Temp:  [97.6 F (36.4 C)-97.9 F (36.6 C)] 97.7 F (36.5 C) (06/17 0715) Pulse Rate:  [74-86] 80 (06/17 0715) Resp:  [12-18] 18 (06/17 0715) BP: (122-193)/(48-83) 122/48 (06/17 0715) SpO2:  [98 %-100 %] 100 % (06/17 0715) Weight:  [54.4 kg] 54.4 kg (06/16 1250)  Intake/Output from previous day: 06/16 0701 - 06/17 0700 In: 1050 [I.V.:1050] Out: 200 [Blood:200] Intake/Output this shift: No intake/output data recorded.  Alert, conversant, working with Therapy. Reports no pain at present. Good strength BLE. Incision without erythema, welling, or drainage beneath honeycomb and Dermabond. Ambulated last nighth and this am.  Lab Results: No results for input(s): WBC, HGB, HCT, PLT in the last 72 hours. BMET No results for input(s): NA, K, CL, CO2, GLUCOSE, BUN, CREATININE, CALCIUM in the last 72 hours.  Studies/Results: Dg Lumbar Spine 1 View  Result Date: 08/15/2018 CLINICAL DATA:  Localization L3-L5 decompressive laminectomy EXAM: LUMBAR SPINE - 1 VIEW COMPARISON:  MRI 04/30/2018 FINDINGS: Posterior surgical instruments are directed at the L4 pedicles and L4-5 disc space. IMPRESSION: Intraoperative localization as above. Electronically Signed   By: Rolm Baptise M.D.   On: 08/15/2018 19:37    Assessment/Plan: Improving  LOS: 1 day  Per Dr. Vertell Limber, d/c to home. Pt declines pain med rx for home; tells nursing she takes Tylenol OTC prn. She already has f/u appt scheduled.    Verdis Prime 08/16/2018, 7:47 AM  Patient is doing well.  Discharge home.

## 2018-08-16 NOTE — Discharge Instructions (Signed)

## 2018-08-16 NOTE — Progress Notes (Signed)
Pt received 3-n-1 from [place] per MD order. Holli Humbles, RN

## 2018-08-16 NOTE — Evaluation (Signed)
Physical Therapy Evaluation Patient Details Name: Gloria Anderson MRN: [id number] DOB: [birth year] Today's Date: 08/16/2018   History of Present Illness  Pt is an 85 yo female. Pt with lumbar spinal stenosis L3-4 and L4-5. On 6/16 pt underwent lumbar 3-5 decompressive lumbar laminectomy.  Clinical Impression  Patient is s/p above surgery resulting in the deficits listed below (see PT Problem List). Pt functioning at min guard/supervision level. Pt with good home set up and support. Pt with noted short term memory but suspect that to be due to medication and anesthesia. Patient will benefit from skilled PT to increase their independence and safety with mobility (while adhering to their precautions) to allow discharge to the venue listed below.    Follow Up Recommendations No PT follow up;Supervision/Assistance - 24 hour    Equipment Recommendations  3in1 (PT)    Recommendations for Other Services       Precautions / Restrictions Precautions Precautions: Back Precaution Booklet Issued: Yes (comment) Precaution Comments: pt with good carry over Required Braces or Orthoses: (no) Restrictions Weight Bearing Restrictions: No      Mobility  Bed Mobility Overal bed mobility: Needs Assistance Bed Mobility: Rolling;Sidelying to Sit;Sit to Sidelying Rolling: Supervision Sidelying to sit: Supervision     Sit to sidelying: Supervision General bed mobility comments: verbal cues for log roll technique  Transfers Overall transfer level: Needs assistance Equipment used: None Transfers: Sit to/from Stand Sit to Stand: Min guard         General transfer comment: verbal cues for hand placement and to contract abdominal muscles  Ambulation/Gait Ambulation/Gait assistance: Min guard Gait Distance (Feet): 200 Feet Assistive device: None Gait Pattern/deviations: Step-through pattern;Decreased stride length Gait velocity: dec Gait velocity interpretation: 1.31 - 2.62 ft/sec,  indicative of limited community ambulator General Gait Details: initially slow and guarded however improved with increased step length, encouraged contraction of abdominal muscles for lower back support  Stairs Stairs: Yes Stairs assistance: Min guard Stair Management: One rail Left;Alternating pattern;Forwards Number of Stairs: 6 General stair comments: min guard for safety, increased time but steady  Wheelchair Mobility    Modified Rankin (Stroke Patients Only)       Balance Overall balance assessment: Mild deficits observed, not formally tested                                           Pertinent Vitals/Pain Pain Assessment: 0-10 Pain Score: 2  Pain Location: back Pain Descriptors / Indicators: Sore Pain Intervention(s): Monitored during session    Home Living Family/patient expects to be discharged to:: Private residence Living Arrangements: Alone(however is going home with son) Available Help at Discharge: Family;Available 24 hours/day Type of Home: House Home Access: Stairs to enter Entrance Stairs-Rails: Left Entrance Stairs-Number of Steps: 5 Home Layout: One level Home Equipment: None      Prior Function Level of Independence: Independent         Comments: lives alone, no AD, drives, works out 3x/wk, Administrator, [employer] and the gym     Hand Dominance   Dominant Hand: Right    Extremity/Trunk Assessment   Upper Extremity Assessment Upper Extremity Assessment: Defer to OT evaluation    Lower Extremity Assessment Lower Extremity Assessment: Overall WFL for tasks assessed    Cervical / Trunk Assessment Cervical / Trunk Assessment: Other exceptions Cervical / Trunk Exceptions: back surgery  Communication   Communication: No difficulties  Cognition Arousal/Alertness: Awake/alert Behavior During Therapy: WFL for tasks assessed/performed Overall Cognitive Status: Within Functional Limits for tasks assessed                                  General Comments: pt does appear to have short term memory deficits however could be anesthesia/pain mediciine      General Comments General comments (skin integrity, edema, etc.): incision covered with dressing    Exercises     Assessment/Plan    PT Assessment Patient needs continued PT services  PT Problem List Decreased strength;Decreased activity tolerance;Decreased balance;Decreased mobility       PT Treatment Interventions DME instruction;Gait training;Stair training;Functional mobility training;Therapeutic activities;Therapeutic exercise;Balance training;Neuromuscular re-education    PT Goals (Current goals can be found in the Care Plan section)  Acute Rehab PT Goals Patient Stated Goal: home PT Goal Formulation: With patient Time For Goal Achievement: 08/30/18 Potential to Achieve Goals: Good    Frequency Min 4X/week   Barriers to discharge        Co-evaluation               AM-PAC PT "6 Clicks" Mobility  Outcome Measure Help needed turning from your back to your side while in a flat bed without using bedrails?: None Help needed moving from lying on your back to sitting on the side of a flat bed without using bedrails?: None Help needed moving to and from a bed to a chair (including a wheelchair)?: None Help needed standing up from a chair using your arms (e.g., wheelchair or bedside chair)?: None Help needed to walk in hospital room?: A Little Help needed climbing 3-5 steps with a railing? : A Little 6 Click Score: 22    End of Session Equipment Utilized During Treatment: Gait belt Activity Tolerance: Patient tolerated treatment well Patient left: in chair;with call bell/phone within reach Nurse Communication: Mobility status PT Visit Diagnosis: Muscle weakness (generalized) (M62.81);Difficulty in walking, not elsewhere classified (R26.2)    Time: 5170-0174 PT Time Calculation (min) (ACUTE ONLY): 31 min   Charges:   PT  Evaluation $PT Eval Moderate Complexity: 1 Mod PT Treatments $Gait Training: 8-22 mins        Kittie Plater, PT, DPT Acute Rehabilitation Services Pager #: [phone] Office #: [phone]   Berline Lopes 08/16/2018, 8:53 AM

## 2018-08-24 ENCOUNTER — Ambulatory Visit: Payer: Self-pay | Admitting: Rheumatology

## 2018-08-30 NOTE — Telephone Encounter (Signed)
Pt wants to know why script is stating to take prn

## 2018-08-30 NOTE — Telephone Encounter (Signed)
I am not sure why it was written to change to as needed but the patient has enough can take it nightly as she has in the past. We can fix it when we send it out next time.

## 2018-08-31 NOTE — Telephone Encounter (Signed)
Called the patient to inform of below. No answer.   LVM informing that the plan is stay on Klonopin, low dose 0.25 mg nightly. So even though written as needed on the script it was written for 30 and this should allow the patient a 2 month supply for her to continue taking 1/2 tablet nightly.   If patient calls back please make her aware that she can continue taking 0.25mg  nightly.

## 2018-09-12 NOTE — Progress Notes (Signed)
Office Visit Note  Patient: Gloria Anderson             Date of Birth: 1933/07/12           MRN: [id number]             PCP: [name], MD Referring: [name], MD Visit Date: 09/13/2018 Occupation: @GUAROCC @  Subjective:  Osteoarthritis (Left hand pain, left 4th digit triggering)   History of Present Illness: Gloria Anderson is a 85 y.o. female with history of polymyalgia rheumatica, osteoarthritis and degenerative disc disease.  She underwent L3-S1 lumbar laminectomy and decompression in June 2019 by Dr. Vertell Limber.  She had good results from her surgery.  She states the radiculopathy stopped.  She states she has not had any muscle weakness or tenderness.  She has been taking Plaquenil.  She has been off prednisone for a while.  Patient states recently she had a bone density by Dr. Forde Dandy and he was concerned about worsening of her bone mass density.  He is considering changing her Fosamax to some other medication.  She has had left fourth trigger finger.  She states she is occasional triggering of the left index finger.  Activities of Daily Living:  Patient reports morning stiffness for 0 none.   Patient Denies nocturnal pain.  Difficulty dressing/grooming: Denies Difficulty climbing stairs: Denies Difficulty getting out of chair: Denies Difficulty using hands for taps, buttons, cutlery, and/or writing: Denies  Review of Systems  Constitutional: Positive for fatigue. Negative for night sweats, weight gain and weight loss.  HENT: Positive for mouth dryness. Negative for mouth sores, trouble swallowing, trouble swallowing and nose dryness.   Eyes: Positive for dryness. Negative for pain, redness and visual disturbance.  Respiratory: Negative for cough, shortness of breath and difficulty breathing.   Cardiovascular: Negative for chest pain, palpitations, hypertension, irregular heartbeat and swelling in legs/feet.  Gastrointestinal: Positive for constipation. Negative for  blood in stool and diarrhea.  Endocrine: Positive for cold intolerance. Negative for increased urination.  Genitourinary: Negative for difficulty urinating and vaginal dryness.  Musculoskeletal: Positive for arthralgias, gait problem and joint pain. Negative for joint swelling, myalgias, muscle weakness, morning stiffness, muscle tenderness and myalgias.  Skin: Negative for color change, rash, hair loss, skin tightness, ulcers and sensitivity to sunlight.  Allergic/Immunologic: Negative for susceptible to infections.  Neurological: Negative for dizziness, memory loss, night sweats and weakness.  Hematological: Negative for bruising/bleeding tendency and swollen glands.  Psychiatric/Behavioral: Negative for depressed mood and sleep disturbance. The patient is not nervous/anxious.     PMFS History:  Patient Active Problem List   Diagnosis Date Noted  . Degenerative lumbar spinal stenosis 08/15/2018  . Memory difficulty 07/26/2017  . Other parasomnia 07/26/2017  . Anemia 06/02/2017  . Osteopenia 06/02/2017  . Spinal stenosis 06/02/2017  . Iron deficiency anemia secondary to inadequate dietary iron intake 08/05/2016  . Fatigue associated with anemia 08/05/2016  . Primary osteoarthritis of both feet 02/04/2016  . Essential hypertension 02/04/2016  . PMR (polymyalgia rheumatica) (HCC) 02/03/2016  . Osteoarthritis, hand 02/03/2016  . Age-related osteoporosis without current pathological fracture 02/03/2016  . DDD (degenerative disc disease), lumbar 02/03/2016  . High risk medication use 02/03/2016  . 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 Cerebral cavernous malformation type 1 12/04/2013  . Night terrors, adult 09/26/2013  . HEMORRHOIDS-EXTERNAL 09/19/2009  . GERD 09/19/2009  . CONSTIPATION 09/19/2009  . DYSPHAGIA 09/19/2009  . PERSONAL HISTORY OF COLONIC POLYPS 09/19/2009  Past Medical History:  Diagnosis Date  . Arthritis   .  Complication of anesthesia   . Constipation   . Depression   . GERD (gastroesophageal reflux disease)   . Hypertension   . Hypothyroidism   . Osteoporosis   . Pneumonia 2018  . Polymyalgia ([place])   . Polymyalgia rheumatica ([place])   . PONV (postoperative nausea and vomiting)     Family History  Problem Relation Age of Onset  . CVA Father   . Tuberculosis Mother   . Lung cancer Brother    Past Surgical History:  Procedure Laterality Date  . COLONOSCOPY W/ POLYPECTOMY    . EYE SURGERY     both cataracts  . LUMBAR LAMINECTOMY/DECOMPRESSION MICRODISCECTOMY N/A 08/15/2018   Procedure: Lumbar three to Lumbar five Decompressive lumbar laminectomy;  Surgeon: Erline Levine, MD;  Location: [facility];  Service: Neurosurgery;  Laterality: N/A;  . NASAL SINUS SURGERY    . SHOULDER SURGERY Right 2007   repair  . TONSILLECTOMY    . TRIGGER FINGER RELEASE  03/19/2011   Procedure:  left middle finger RELEASE TRIGGER FINGER/A-1 PULLEY;  Surgeon: Cammie Sickle., MD;  Location: [facility];  Service: Orthopedics;  Laterality: Right;  Procedure:  Release Right Long and Ring Trigger Fingers, Release Left Long Trigger Finger, Injection Left Long Proximal Phalangeal Joint  . TRIGGER FINGER RELEASE Right    Ring finger, middle finger  . UPPER GASTROINTESTINAL ENDOSCOPY     Social History   Social History Narrative   Patient is widowed.   Patient has two children.   Patient does not drink any caffeine.    Patient has a high school education.   Patient is right-handed.            Immunization History  Administered Date(s) Administered  . Influenza, High Dose Seasonal PF 12/19/2016  . Zoster Recombinat (Shingrix) 08/01/2017, 10/10/2017     Objective: Vital Signs: BP 137/65 (BP Location: Left Arm, Patient Position: Sitting, Cuff Size: Normal)   Pulse 72   Resp 14   Ht 5\' 4"  (1.626 m)   Wt 121 lb 12.8 oz (55.2 kg)   BMI 20.91 kg/m    Physical Exam Vitals signs and nursing  note reviewed.  Constitutional:      Appearance: She is well-developed.  HENT:     Head: Normocephalic and atraumatic.  Eyes:     Conjunctiva/sclera: Conjunctivae normal.  Neck:     Musculoskeletal: Normal range of motion.  Cardiovascular:     Rate and Rhythm: Normal rate and regular rhythm.     Heart sounds: Normal heart sounds.  Pulmonary:     Effort: Pulmonary effort is normal.     Breath sounds: Normal breath sounds.  Abdominal:     General: Bowel sounds are normal.     Palpations: Abdomen is soft.  Lymphadenopathy:     Cervical: No cervical adenopathy.  Skin:    General: Skin is warm and dry.     Capillary Refill: Capillary refill takes less than 2 seconds.  Neurological:     Mental Status: She is alert and oriented to person, place, and time.  Psychiatric:        Behavior: Behavior normal.      Musculoskeletal Exam: C-spine was in good range of motion.  She had lumbar surgery and has some limitation with range of motion.  Shoulder joints with good range of motion.  She had discomfort range of motion of her left shoulder joint.  Elbow joints and wrist joints with good range of motion.  She has DIP and PIP thickening of her hands and feet consistent with osteoarthritis.  Other joints were in full range of motion with no synovitis.  CDAI Exam: CDAI Score: - Patient Global: -; Provider Global: - Swollen: -; Tender: - Joint Exam   No joint exam has been documented for this visit   There is currently no information documented on the homunculus. Go to the Rheumatology activity and complete the homunculus joint exam.  Investigation: No additional findings.  Imaging: Dg Lumbar Spine 1 View  Result Date: 08/15/2018 CLINICAL DATA:  Localization L3-L5 decompressive laminectomy EXAM: LUMBAR SPINE - 1 VIEW COMPARISON:  MRI 04/30/2018 FINDINGS: Posterior surgical instruments are directed at the L4 pedicles and L4-5 disc space. IMPRESSION: Intraoperative localization as above.  Electronically Signed   By: Rolm Baptise M.D.   On: 08/15/2018 19:37    Recent Labs: Lab Results  Component Value Date   WBC 5.3 08/11/2018   HGB 11.6 (L) 08/11/2018   PLT 183 08/11/2018   NA 140 08/11/2018   K 4.6 08/11/2018   CL 105 08/11/2018   CO2 25 08/11/2018   GLUCOSE 110 (H) 08/11/2018   BUN 22 08/11/2018   CREATININE 1.26 (H) 08/11/2018   BILITOT 0.9 03/23/2018   AST 34 03/23/2018   ALT 28 03/23/2018   PROT 6.8 03/23/2018   CALCIUM 9.5 08/11/2018   GFRAA 46 (L) 08/11/2018    Speciality Comments: PLQ eye exam: 10/11/2017 normal. Dr. Gershon Crane. Follow up in 1 year.  Procedures:  No procedures performed Allergies: Patient has no known allergies.   Assessment / Plan:     Visit Diagnoses: PMR (polymyalgia rheumatica) (HCC) - In remission  -patient has been on low-dose Plaquenil which she has been tolerating well.  She has been off prednisone for more than 2 years now.  She had a flare of PMR once we took her off the Plaquenil.  We decided to keep her on Plaquenil at this time.  High risk medication use - PLQ 200 mg p.o. daily.  Eye exam 10/11/17 - Plan: Her labs are stable.  Her next labs will be due at follow-up visit.  She will need eye exam this year.  Chronic left shoulder pain -patient has had pain in her left shoulder for several months.  She would like to go for physical therapy.  A prescription for physical therapy was given.  Primary osteoarthritis of both hands -joint protection and muscle strengthening was discussed.  Trigger ring finger of left hand -she has had 3 injections 1 year apart.  She states that she continues to have discomfort and did not have much relief with injections.  Have advised her to schedule appointment with hand surgery.  She has seen Dr. Daylene Katayama in the past.  She can go to the group.  Primary osteoarthritis of both feet -doing well.  DDD (degenerative disc disease), lumbar - August 15, 2018 L3-S1 lumbar laminectomy and decompression by Dr.  Vertell Limber -doing much better.  Age-related osteoporosis without current pathological fracture -patient is currently on Fosamax.  Patient states that her bone density showed deterioration of the bone mass.  I do not have the DEXA to review.  I have advised her to discuss this further with Dr. Forde Dandy.  She may benefit from use of Forteo injections.  History of gastroesophageal reflux (GERD)  History of anemia -she has chronic mild anemia.  History of hypertension -blood pressure is better controlled.  Orders: No orders of the defined types were placed in this encounter.  No orders of the defined types were placed in this encounter.   Face-to-face time spent with patient was 35 minutes. Greater than 50% of time was spent in counseling and coordination of care.  Follow-Up Instructions: Return in about 6 months (around 03/16/2019) for Polymyalgia rheumatica, Osteoarthritis, Osteoporosis.   Bo Merino, MD  Note - This record has been created using Editor, commissioning.  Chart creation errors have been sought, but may not always  have been located. Such creation errors do not reflect on  the standard of medical care.

## 2018-09-13 ENCOUNTER — Encounter: Payer: Self-pay | Admitting: Rheumatology

## 2018-09-13 ENCOUNTER — Ambulatory Visit: Payer: Medicare Other | Admitting: Rheumatology

## 2018-09-13 VITALS — BP 137/65 | HR 72 | Resp 14 | Ht 64.0 in | Wt 121.8 lb

## 2018-09-13 DIAGNOSIS — M19041 Primary osteoarthritis, right hand: Secondary | ICD-10-CM | POA: Diagnosis not present

## 2018-09-13 DIAGNOSIS — M810 Age-related osteoporosis without current pathological fracture: Secondary | ICD-10-CM

## 2018-09-13 DIAGNOSIS — G8929 Other chronic pain: Secondary | ICD-10-CM

## 2018-09-13 DIAGNOSIS — Z8719 Personal history of other diseases of the digestive system: Secondary | ICD-10-CM

## 2018-09-13 DIAGNOSIS — M25512 Pain in left shoulder: Secondary | ICD-10-CM | POA: Diagnosis not present

## 2018-09-13 DIAGNOSIS — M19042 Primary osteoarthritis, left hand: Secondary | ICD-10-CM

## 2018-09-13 DIAGNOSIS — Z862 Personal history of diseases of the blood and blood-forming organs and certain disorders involving the immune mechanism: Secondary | ICD-10-CM

## 2018-09-13 DIAGNOSIS — M5136 Other intervertebral disc degeneration, lumbar region: Secondary | ICD-10-CM

## 2018-09-13 DIAGNOSIS — M19071 Primary osteoarthritis, right ankle and foot: Secondary | ICD-10-CM

## 2018-09-13 DIAGNOSIS — Z79899 Other long term (current) drug therapy: Secondary | ICD-10-CM | POA: Diagnosis not present

## 2018-09-13 DIAGNOSIS — Z8679 Personal history of other diseases of the circulatory system: Secondary | ICD-10-CM

## 2018-09-13 DIAGNOSIS — M353 Polymyalgia rheumatica: Secondary | ICD-10-CM | POA: Diagnosis not present

## 2018-09-13 DIAGNOSIS — Z9889 Other specified postprocedural states: Secondary | ICD-10-CM

## 2018-09-13 DIAGNOSIS — M19072 Primary osteoarthritis, left ankle and foot: Secondary | ICD-10-CM

## 2018-09-13 DIAGNOSIS — M65342 Trigger finger, left ring finger: Secondary | ICD-10-CM

## 2018-09-22 ENCOUNTER — Telehealth: Payer: Self-pay | Admitting: *Deleted

## 2018-09-22 NOTE — Telephone Encounter (Signed)
Spoke with patient to see if she has spoke with Dr. Forde Dandy regarding her bone density scan. Patient states she has not spoke with them yet. Patient states she has not. Patient encouraged to call Dr. Baldwin Crown office and set up an appointment to discuss.

## 2018-10-02 ENCOUNTER — Ambulatory Visit: Payer: Medicare Other | Admitting: Rheumatology

## 2018-10-24 ENCOUNTER — Telehealth: Payer: Self-pay | Admitting: Neurology

## 2018-10-24 ENCOUNTER — Other Ambulatory Visit: Payer: Self-pay | Admitting: Neurology

## 2018-10-24 DIAGNOSIS — F514 Sleep terrors [night terrors]: Secondary | ICD-10-CM

## 2018-10-24 NOTE — Telephone Encounter (Signed)
Pt is needing a refill on her clonazePAM (KLONOPIN) 0.5 MG tablet sent to [facility] Pt is needing a 90 day supply

## 2018-10-24 NOTE — Telephone Encounter (Signed)
I have routed this request to Dr Dohmeier for review. The pt is due for the medication and [place] registry was verified.  

## 2018-10-25 MED ORDER — CLONAZEPAM 0.5 MG PO TABS: ORAL_TABLET | ORAL | 1 refills | Status: DC

## 2018-11-13 ENCOUNTER — Other Ambulatory Visit: Payer: Self-pay

## 2018-11-13 DIAGNOSIS — I10 Essential (primary) hypertension: Secondary | ICD-10-CM

## 2018-11-13 MED ORDER — CARVEDILOL 3.125 MG PO TABS: 3.1250 mg | ORAL_TABLET | Freq: Two times a day (BID) | ORAL | 1 refills | Status: DC

## 2018-11-28 ENCOUNTER — Other Ambulatory Visit: Payer: Self-pay

## 2018-11-28 DIAGNOSIS — I10 Essential (primary) hypertension: Secondary | ICD-10-CM

## 2018-11-28 MED ORDER — HYDRALAZINE HCL 25 MG PO TABS: 25.0000 mg | ORAL_TABLET | Freq: Three times a day (TID) | ORAL | 3 refills | Status: DC | PRN

## 2018-11-29 ENCOUNTER — Ambulatory Visit (HOSPITAL_COMMUNITY)
Admission: RE | Admit: 2018-11-29 | Discharge: 2018-11-29 | Disposition: A | Discharge status: Home / Self Care | Payer: Medicare Other | Source: Ambulatory Visit | Attending: Endocrinology | Admitting: Endocrinology

## 2018-11-29 ENCOUNTER — Other Ambulatory Visit: Payer: Self-pay

## 2018-11-29 DIAGNOSIS — M810 Age-related osteoporosis without current pathological fracture: Secondary | ICD-10-CM | POA: Diagnosis present

## 2018-11-29 MED ORDER — DENOSUMAB 60 MG/ML ~~LOC~~ SOSY
PREFILLED_SYRINGE | SUBCUTANEOUS | Status: AC
  Administered 2018-11-29: 60 mg via SUBCUTANEOUS
  Filled 2018-11-29: qty 1

## 2018-11-29 MED ORDER — DENOSUMAB 60 MG/ML ~~LOC~~ SOSY
60.0000 mg | PREFILLED_SYRINGE | Freq: Once | SUBCUTANEOUS | Status: AC
  Administered 2018-11-29: 13:00:00 60 mg via SUBCUTANEOUS

## 2018-12-08 ENCOUNTER — Telehealth (INDEPENDENT_AMBULATORY_CARE_PROVIDER_SITE_OTHER): Payer: Self-pay | Admitting: Cardiology

## 2018-12-08 DIAGNOSIS — F411 Generalized anxiety disorder: Secondary | ICD-10-CM

## 2018-12-08 DIAGNOSIS — I10 Essential (primary) hypertension: Secondary | ICD-10-CM

## 2018-12-08 NOTE — Telephone Encounter (Signed)
Pt aware.

## 2018-12-08 NOTE — Telephone Encounter (Signed)
Pt called c/o BP being high of 178/96 @ 7 am; she took hydralazine @ 9 am 171/88 hr 103; @ 10 am took coreg took bp 164/92 hr 105; Pt is just tired , no cp, no sob, just feels weak; Wants to know what to do

## 2018-12-08 NOTE — Telephone Encounter (Signed)
Have her start taking her hydralazine 3 times a day approximately 6 hours apart to see if that will help. Suspect her tiredness is related to her blood pressure.

## 2018-12-11 ENCOUNTER — Telehealth: Payer: Self-pay

## 2018-12-11 NOTE — Telephone Encounter (Signed)
Patient called me last evening stating that her blood pressure has been very high, systolic blood pressure 99991111 mmHg.  She checked it prior to going to bed and was worried.  I reviewed her medications, she last took extra dose of hydralazine at 2:00 this afternoon, it is 9 PM today on 12/10/2018, advised her to take extra dose of hydralazine.  Patient has an appointment to see a soon and also an echocardiogram has been scheduled.  Advised her to continue to trend her blood pressure and to contact us back if she has any new symptoms.  Patient essentially asymptomatic and extremely anxious.  She also has been taking Xanax for anxiety, advised her to contact her PCP to see if she needs to be titrated for anxiety which may be precipitating her elevated blood pressure as well.  This was a 9 minute discussion with patient with additional 5 minutes for documentation.

## 2018-12-13 ENCOUNTER — Other Ambulatory Visit: Payer: Self-pay

## 2018-12-13 ENCOUNTER — Ambulatory Visit (INDEPENDENT_AMBULATORY_CARE_PROVIDER_SITE_OTHER): Payer: Self-pay

## 2018-12-13 DIAGNOSIS — I340 Nonrheumatic mitral (valve) insufficiency: Secondary | ICD-10-CM | POA: Diagnosis not present

## 2018-12-14 ENCOUNTER — Other Ambulatory Visit: Payer: Self-pay | Admitting: Rheumatology

## 2018-12-14 NOTE — Telephone Encounter (Signed)
Last Visit: 09/13/18  Next Visit: 03/22/19 Labs: 08/11/18 Glucose 110, Creat. 1.26 GFR 40 PLQ eye exam: 10/16/2018 normal.   Okay to refill per Dr. Estanislado Pandy

## 2018-12-22 ENCOUNTER — Encounter: Payer: Self-pay | Admitting: Cardiology

## 2018-12-22 ENCOUNTER — Other Ambulatory Visit: Payer: Self-pay

## 2018-12-22 ENCOUNTER — Ambulatory Visit: Payer: Self-pay | Admitting: Cardiology

## 2018-12-22 VITALS — BP 155/72 | HR 81 | Temp 96.7°F | Ht 64.0 in | Wt 120.7 lb

## 2018-12-22 DIAGNOSIS — F411 Generalized anxiety disorder: Secondary | ICD-10-CM

## 2018-12-22 DIAGNOSIS — I10 Essential (primary) hypertension: Secondary | ICD-10-CM

## 2018-12-22 DIAGNOSIS — I951 Orthostatic hypotension: Secondary | ICD-10-CM | POA: Diagnosis not present

## 2018-12-22 NOTE — Progress Notes (Signed)
Virtual Visit via Telephone Note: Patient unable to use video assisted device.  This visit type was conducted due to national recommendations for restrictions regarding the COVID-19 Pandemic (e.g. social distancing).  This format is felt to be most appropriate for this patient at this time.  All issues noted in this document were discussed and addressed.  No physical exam was performed.  The patient has consented to conduct a Telehealth visit and understands insurance will be billed.   I connected with@, on 12/22/18 at  by TELEPHONE and verified that I am speaking with the correct person using two identifiers.   I discussed the limitations of evaluation and management by telemedicine and the availability of in person appointments. The patient expressed understanding and agreed to proceed.   I have discussed with patient regarding the safety during COVID Pandemic and steps and precautions to be taken including social distancing, frequent hand wash and use of detergent soap, gels with the patient. I asked the patient to avoid touching mouth, nose, eyes, ears with the hands. I encouraged regular walking around the neighborhood and exercise and regular diet, as long as social distancing can be maintained.  Primary Physician/Referring:  [name], MD  Patient ID: Gloria Anderson, female    DOB: 10-24-1933, 85 y.o.   MRN: [id number]  Chief Complaint  Patient presents with  . Hypertension  . Follow-up    echo results     HPI: Gloria Anderson  is a 85 y.o. female  with essential hypertension and orthostatic hypotension, mild hyperlipidemia, moderate MR and TR, moderate pulmonary hypertension presents here for 6 month follow-up of hypertension and valvular heart disease.  She had back surgery in June 2020 and has not had back pain since then. Feels well overall and is concerned about elevated BP.  She constantly checks blood pressure and gets worried about elevated blood pressure, I had  multiple phone calls in the past. Presently doing well and except for dizziness when she stands up or is on her feet for long period of time, remains asymptomatic, back pain has resolved.  Past Medical History:  Diagnosis Date  . Arthritis   . Complication of anesthesia   . Constipation   . Depression   . GERD (gastroesophageal reflux disease)   . Hypertension   . Hypothyroidism   . Osteoporosis   . Pneumonia 2018  . Polymyalgia ([place])   . Polymyalgia rheumatica ([place])   . PONV (postoperative nausea and vomiting)     Past Surgical History:  Procedure Laterality Date  . COLONOSCOPY W/ POLYPECTOMY    . EYE SURGERY     both cataracts  . LUMBAR LAMINECTOMY/DECOMPRESSION MICRODISCECTOMY N/A 08/15/2018   Procedure: Lumbar three to Lumbar five Decompressive lumbar laminectomy;  Surgeon: Erline Levine, MD;  Location: [facility];  Service: Neurosurgery;  Laterality: N/A;  . NASAL SINUS SURGERY    . SHOULDER SURGERY Right 2007   repair  . TONSILLECTOMY    . TRIGGER FINGER RELEASE  03/19/2011   Procedure:  left middle finger RELEASE TRIGGER FINGER/A-1 PULLEY;  Surgeon: Cammie Sickle., MD;  Location: [facility];  Service: Orthopedics;  Laterality: Right;  Procedure:  Release Right Long and Ring Trigger Fingers, Release Left Long Trigger Finger, Injection Left Long Proximal Phalangeal Joint  . TRIGGER FINGER RELEASE Right    Ring finger, middle finger  . UPPER GASTROINTESTINAL ENDOSCOPY      Social History   Socioeconomic History  . Marital status: Widowed  Spouse name: Not on file  . Number of children: 2  . Years of education: HS  . Highest education level: Not on file  Occupational History  . Occupation: RETIRED    Employer: [employer]  . Financial resource strain: Not on file  . Food insecurity    Worry: Not on file    Inability: Not on file  . Transportation needs    Medical: Not on file    Non-medical: Not on file  Tobacco Use  . Smoking  status: Never Smoker  . Smokeless tobacco: Never Used  Substance and Sexual Activity  . Alcohol use: No    Alcohol/week: 0.0 standard drinks  . Drug use: Never  . Sexual activity: Not on file  Lifestyle  . Physical activity    Days per week: Not on file    Minutes per session: Not on file  . Stress: Not on file  Relationships  . Social Herbalist on phone: Not on file    Gets together: Not on file    Attends religious service: Not on file    Active member of club or organization: Not on file    Attends meetings of clubs or organizations: Not on file    Relationship status: Not on file  . Intimate partner violence    Fear of current or ex partner: Not on file    Emotionally abused: Not on file    Physically abused: Not on file    Forced sexual activity: Not on file  Other Topics Concern  . Not on file  Social History Narrative   Patient is widowed.   Patient has two children.   Patient does not drink any caffeine.    Patient has a high school education.   Patient is right-handed.             Current Outpatient Medications on File Prior to Visit  Medication Sig Dispense Refill  . antiseptic oral rinse (BIOTENE) LIQD 15 mLs by Mouth Rinse route 3 (three) times daily as needed for dry mouth.     Marland Kitchen atorvastatin (LIPITOR) 10 MG tablet Take 10 mg by mouth daily.     Marland Kitchen BIOTIN FORTE PO Take 1 tablet by mouth daily.     . Calcium Citrate (CITRACAL PO) Take 1,200 mg by mouth daily.     . Carboxymethylcell-Hypromellose (GENTEAL OP) Place 1 drop into both eyes 2 (two) times daily as needed.     . carvedilol (COREG) 3.125 MG tablet Take 1 tablet (3.125 mg total) by mouth 2 (two) times daily. 180 tablet 1  . clonazePAM (KLONOPIN) 0.5 MG tablet Take half tablet at bedtime as needed (0.25 MG) (Patient taking differently: Take 0.5 mg by mouth at bedtime. Take half tablet at bedtime as needed (0.25 MG)) 90 tablet 1  . denosumab (PROLIA) 60 MG/ML SOSY injection Inject 60 mg into  the skin every 6 (six) months.    Mariane Baumgarten Calcium (STOOL SOFTENER PO) Take 100 mg by mouth daily.     . fish oil-omega-3 fatty acids 1000 MG capsule Take 1 g by mouth daily.     . fluorometholone (FML) 0.1 % ophthalmic suspension as needed.    . Garlic 4128 MG TBEC Take 2,000 mg by mouth daily.     . hydrALAZINE (APRESOLINE) 25 MG tablet Take 1 tablet (25 mg total) by mouth 3 (three) times daily as needed (blood pressure >150). 270 tablet 3  . hydroxychloroquine (PLAQUENIL)  200 MG tablet TAKE 1 TABLET BY MOUTH  DAILY 90 tablet 0  . Iron-Vitamins (GERITOL PO) Take 1 tablet by mouth daily.     Marland Kitchen levothyroxine (SYNTHROID, LEVOTHROID) 50 MCG tablet Take 50 mcg by mouth daily before breakfast.     . omeprazole (PRILOSEC) 20 MG capsule Take 20 mg by mouth daily.    . polyethylene glycol (MIRALAX / GLYCOLAX) packet Take 17 g by mouth daily as needed for moderate constipation.     . Probiotic Product (PROBIOTIC DAILY PO) Take 1 tablet by mouth daily.     No current facility-administered medications on file prior to visit.     Review of Systems  Constitution: Negative for chills, decreased appetite, malaise/fatigue and weight gain.  Cardiovascular: Negative for dyspnea on exertion, leg swelling, near-syncope and syncope.  Respiratory: Positive for wheezing.   Endocrine: Negative for cold intolerance.  Hematologic/Lymphatic: Does not bruise/bleed easily.  Musculoskeletal: Positive for back pain, muscle cramps and myalgias. Negative for joint swelling.  Gastrointestinal: Negative for abdominal pain, anorexia and change in bowel habit.  Neurological: Positive for dizziness (occasional). Negative for headaches and light-headedness.  Psychiatric/Behavioral: Negative for depression and substance abuse.  All other systems reviewed and are negative.     Objective:  Blood pressure (!) 155/72, pulse 81, temperature (!) 96.7 F (35.9 C), height _0  (1.626 m), weight 120 lb 11.2 oz (54.7 kg), SpO2 99  %. Body mass index is 20.72 kg/m.  Physical exam not performed due to telephone encounter only. Prior visit information is as below.   Physical Exam  Constitutional: She appears well-developed and well-nourished. No distress.  HENT:  Head: Atraumatic.  Eyes: Conjunctivae are normal.  Neck: Neck supple. No JVD present. No thyromegaly present.  Cardiovascular: Normal rate, regular rhythm and intact distal pulses. Exam reveals no gallop.  Murmur heard. High-pitched blowing midsystolic murmur is present with a grade of 2/6 at the apex. Pulses:      Carotid pulses are on the right side with bruit and on the left side with bruit.      Femoral pulses are on the right side with bruit and on the left side with bruit. Pulmonary/Chest: Effort normal and breath sounds normal.  Abdominal: Soft. Bowel sounds are normal. She exhibits abdominal bruit.  Musculoskeletal: Normal range of motion.        General: No edema.  Neurological: She is alert.  Skin: Skin is warm and dry.  Psychiatric: She has a normal mood and affect.   Radiology: No results found. Laboratory Examination:   03/22/2017: Creatinine 1.1, EGFR 47/57, potassium 4.4, CMP normal.  RBC 3.7, normal hemoglobin, hematocrit 35.8, MCV 97, MCH 33, RDW 11.7%.  Cholesterol 268, triglycerides 58, HDL 94, LDL 162.  TSH 1.9.  CMP Latest Ref Rng & Units 08/11/2018 03/23/2018 09/20/2017  Glucose 70 - 99 mg/dL 110(H) 92 101(H)  BUN 8 - 23 mg/dL _1 Creatinine 0.44 - 1.00 mg/dL 1.26(H) 1.02(H) 1.13(H)  Sodium 135 - 145 mmol/L 140 140 139  Potassium 3.5 - 5.1 mmol/L 4.6 4.5 4.4  Chloride 98 - 111 mmol/L 105 101 103  CO2 22 - 32 mmol/L _2 Calcium 8.9 - 10.3 mg/dL 9.5 9.9 9.4  Total Protein 6.1 - 8.1 g/dL - 6.8 6.9  Total Bilirubin 0.2 - 1.2 mg/dL - 0.9 0.9  AST 10 - 35 U/L - 34 26  ALT 6 - 29 U/L - 28 16   CBC Latest Ref Rng & Units  08/11/2018 03/23/2018 09/20/2017  WBC 4.0 - 10.5 K/uL 5.3 5.2 5.8  Hemoglobin 12.0 - 15.0 g/dL 11.6(L)  11.4(L) 11.6(L)  Hematocrit 36.0 - 46.0 % 34.6(L) 34.0(L) 33.4(L)  Platelets 150 - 400 K/uL 183 182 201   Lipid Panel  No results found for: CHOL, TRIG, HDL, CHOLHDL, VLDL, LDLCALC, LDLDIRECT HEMOGLOBIN A1C No results found for: HGBA1C, MPG TSH No results for input(s): TSH in the last 8760 hours.  Cardiac studies:   Exercise Treadmill Stress Test 10/07/2017:  Indication: chest pain The patient exercised on Bruce protocol for  06:19 min. Patient achieved  7.40 METS and reached HR  114 bpm, which is  82 % of maximum age-predicted HR.  Stress test terminated due to fatigue.   Exercise capacity was fair for age. HR Response to Exercise: Appropriate. BP Response to Exercise: Normal resting BP- appropriate response. Chest Pain: none. Arrhythmias: Occasional PVC s. Resting EKG demonstrates Normal sinus rhythm. ST Changes: With peak exercise there was no ST-T changes of ischemia.  Overall Impression:  Submaximal stress test with no ischemic changes. Continue primary/secondary prevention.  Echocardiogram 12/13/2018: Normal LV systolic function with EF 67%. Left ventricle  cavity is normal in size. Normal left ventricular wall  thickness. Normal global wall motion. Diastolic function  could not be assessed due to severity of mitral  regurgitation.  Moderate (Grade II) mitral regurgitation. Moderate tricuspid regurgitation. Moderate pulmonary  hypertension. Estimated pulmonary artery systolic pressure is  50 mmHg.  Small circumferential pericardial effusion wth no hemodynamic  compromise.  IVC is dilated with a respiratory response of <50%. Estimated  RA pressure 10-15 mmHg. No significant change compared to previous study on  05/09/2018.   Assessment:      ICD-10-CM   1. Essential hypertension  I10 EKG 12-Lead  2. Generalized anxiety disorder  F41.1   3. Orthostatic hypotension  I95.1     EKG 05/12/2017: Normal sinus rhythm at rate of 66 bpm, borderline criteria for left abnormality,  otherwise normal EKG.   Recommendations:    Patient is here on a six-month office visit and follow-up of supine hypertension, orthostatic hypotension, chronic anxiety and chronic dizziness.  She also has moderate to MR and TR and moderate pulmonary hypertension.  She underwent back surgery with spinal fusion in June 2020 without any perioperative cardiac applications.  I have reviewed the echocardiogram the patient, I simply reassured her.  She has generalized anxiety disorder, that increases her blood pressure.  Relatively speaking blood pressure has been well-controlled without significant dizziness and she has been wearing support stockings regularly.  Advised her that she can certainly use extra dose of carvedilol on the days that the blood pressure is high and also continues hydralazine on a p.r.n. basis as well.  These medications have done well with her and she felt reassured, I'll see her back in 6 months.  Although she has moderate MR, moderate TR and moderate pulmonary hypertension, PA pressure around 50 mmHg, there is no clinical evidence of heart failure, no right ventricular strain, also pulmonary hypertension has been ongoing for several years and has not changed.  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  Adrian Prows, MD, [facility] 12/22/2018, 12:03 PM [facility] [place] Pager: [phone] Office: [phone] If no answer Cell [phone]

## 2019-01-09 ENCOUNTER — Ambulatory Visit: Payer: Medicare Other | Admitting: Neurology

## 2019-01-09 ENCOUNTER — Encounter: Payer: Self-pay | Admitting: Neurology

## 2019-01-09 ENCOUNTER — Other Ambulatory Visit: Payer: Self-pay

## 2019-01-09 VITALS — BP 121/78 | HR 82 | Temp 98.0°F | Ht 64.0 in | Wt 119.0 lb

## 2019-01-09 DIAGNOSIS — Q283 Other malformations of cerebral vessels: Secondary | ICD-10-CM | POA: Diagnosis not present

## 2019-01-09 DIAGNOSIS — F514 Sleep terrors [night terrors]: Secondary | ICD-10-CM | POA: Diagnosis not present

## 2019-01-09 MED ORDER — CLONAZEPAM 0.5 MG PO TABS: ORAL_TABLET | ORAL | 5 refills | Status: DC

## 2019-01-09 NOTE — Patient Instructions (Signed)
Clonazepam tablets What is this medicine? CLONAZEPAM (kloe NA ze pam) is a benzodiazepine. It is used to treat certain types of seizures. It is also used to treat panic disorder. This medicine may be used for other purposes; ask your health care provider or pharmacist if you have questions. COMMON BRAND NAME(S): Ceberclon, Klonopin What should I tell my health care provider before I take this medicine? They need to know if you have any of these conditions:  an alcohol or drug abuse problem  bipolar disorder, depression, psychosis or other mental health condition  glaucoma  kidney or liver disease  lung or breathing disease  myasthenia gravis  Parkinson's disease  porphyria  seizures or a history of seizures  suicidal thoughts  an unusual or allergic reaction to clonazepam, other benzodiazepines, foods, dyes, or preservatives  pregnant or trying to get pregnant  breast-feeding How should I use this medicine? Take this medicine by mouth with a glass of water. Follow the directions on the prescription label. If it upsets your stomach, take it with food or milk. Take your medicine at regular intervals. Do not take it more often than directed. Do not stop taking or change the dose except on the advice of your doctor or health care professional. A special MedGuide will be given to you by the pharmacist with each prescription and refill. Be sure to read this information carefully each time. Talk to your pediatrician regarding the use of this medicine in children. Special care may be needed. Overdosage: If you think you have taken too much of this medicine contact a poison control center or emergency room at once. NOTE: This medicine is only for you. Do not share this medicine with others. What if I miss a dose? If you miss a dose, take it as soon as you can. If it is almost time for your next dose, take only that dose. Do not take double or extra doses. What may interact with this  medicine? Do not take this medication with any of the following medicines:  narcotic medicines for cough  sodium oxybate This medicine may also interact with the following medications:  alcohol  antihistamines for allergy, cough and cold  antiviral medicines for HIV or AIDS  certain medicines for anxiety or sleep  certain medicines for depression, like amitriptyline, fluoxetine, sertraline  certain medicines for fungal infections like ketoconazole and itraconazole  certain medicines for seizures like carbamazepine, phenobarbital, phenytoin, primidone  general anesthetics like halothane, isoflurane, methoxyflurane, propofol  local anesthetics like lidocaine, pramoxine, tetracaine  medicines that relax muscles for surgery  narcotic medicines for pain  phenothiazines like chlorpromazine, mesoridazine, prochlorperazine, thioridazine This list may not describe all possible interactions. Give your health care provider a list of all the medicines, herbs, non-prescription drugs, or dietary supplements you use. Also tell them if you smoke, drink alcohol, or use illegal drugs. Some items may interact with your medicine. What should I watch for while using this medicine? Tell your doctor or health care professional if your symptoms do not start to get better or if they get worse. Do not stop taking except on your doctor's advice. You may develop a severe reaction. Your doctor will tell you how much medicine to take. You may get drowsy or dizzy. Do not drive, use machinery, or do anything that needs mental alertness until you know how this medicine affects you. To reduce the risk of dizzy and fainting spells, do not stand or sit up quickly, especially if you are  an older patient. Alcohol may increase dizziness and drowsiness. Avoid alcoholic drinks. If you are taking another medicine that also causes drowsiness, you may have more side effects. Give your health care provider a list of all  medicines you use. Your doctor will tell you how much medicine to take. Do not take more medicine than directed. Call emergency for help if you have problems breathing or unusual sleepiness. The use of this medicine may increase the chance of suicidal thoughts or actions. Pay special attention to how you are responding while on this medicine. Any worsening of mood, or thoughts of suicide or dying should be reported to your health care professional right away. What side effects may I notice from receiving this medicine? Side effects that you should report to your doctor or health care professional as soon as possible:  allergic reactions like skin rash, itching or hives, swelling of the face, lips, or tongue  breathing problems  confusion  loss of balance or coordination  signs and symptoms of low blood pressure like dizziness; feeling faint or lightheaded, falls; unusually weak or tired  suicidal thoughts or mood changes Side effects that usually do not require medical attention (report to your doctor or health care professional if they continue or are bothersome):  dizziness  headache  tiredness  upset stomach This list may not describe all possible side effects. Call your doctor for medical advice about side effects. You may report side effects to FDA at 1-800-FDA-1088. Where should I keep my medicine? Keep out of the reach of children. This medicine can be abused. Keep your medicine in a safe place to protect it from theft. Do not share this medicine with anyone. Selling or giving away this medicine is dangerous and against the law. This medicine may cause accidental overdose and death if taken by other adults, children, or pets. Mix any unused medicine with a substance like cat litter or coffee grounds. Then throw the medicine away in a sealed container like a sealed bag or a coffee can with a lid. Do not use the medicine after the expiration date. Store at room temperature between  15 and 30 degrees C (59 and 86 degrees F). Protect from light. Keep container tightly closed. NOTE: This sheet is a summary. It may not cover all possible information. If you have questions about this medicine, talk to your doctor, pharmacist, or health care provider.  2020 Elsevier/Gold Standard (2015-07-25 18:46:32)  

## 2019-01-09 NOTE — Progress Notes (Signed)
[facility]  Provider:  Larey Seat, MD    Referring Provider: [name], MD Primary Care Physician:  [name], MD  Chief Complaint  Patient presents with  . Follow-up    pt here for follow up visit, alone, rm 11. pt states that she notices she is more anxious then normal    HPI:   This patient of Dr. 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 She tolerates Klonopin and is happy with the REM sleep control. She has developed higher blood pressures spurs of higher blood pressures that Dr. Forde Dandy and Dr. Einar Gip have followed, and initiated treatment- she developed orthostatic BP!Marland Kitchen She was found to be anemic, sleepy and fatigued. Has normal B 12 and started oral iron. She has a history of PMR. Polymyalgia rheumatica. She had a normal colonoscopy in 2016. Had lost weight , but recovered . Gloria Anderson brought her last laboratory results with her, white blood cell count was 3.74K, red blood cell count was 2.9, hemoglobin 9.8 g, hematocrit 28.4%, corpuscular volume 97.1 MCH 33.5. She was over the phone prescribed Nu-iron to take as a supplement. Needs TIBC, Total iron deficiency,   09-26-13- The patient's sleep study from 08-05-13 revealed no apnea, no oxygen desaturation, no irregular heart beats, and only took clusters of limb movements that seem to be related to discomfort with sleeping on the left side. She had no periodic limb movements during REM sleep. There was a prolonged period of slow-wave sleep at around 2 AM during which nor night terrors sleep walking or sleep talking was ordered. The patient reported  that she had another spell- a feeling as if something brushes up on her scalp, or behind her head.  Since the patient has a long-standing bruit that she can hear at night and that bothers her( tinnitus) I have also suggested  to do an MRA of the brain , as I am concerned that they're tortuous vessels , which  may be affecting the left frontal lobe perfusion. This could be a trigger for a nocturnal event as described by the patient clinically. Her MRi documented to my surprise exactly that ! An intracerebral  Venous- cavernous malformation. We are now meeting on 09-26-13 to discuss the results and documented the findings. She had more Night terrors the last weeks. The gabapentin gave her diplopia. I suggest now Topiramate, but she reminded me of involuntary weight loss and loss of apetite. I will try a very low dose of KLONOPIN.  The patient should watch her BP and avoid straining , pressure inducing activities.   RV 02-27-14  Discussed EEG results, no epileptiform activity , but frontal sharps noted. These are of unclear significance.  See report. No change in meds.needed  Patient is free to travel to [place] in April.  The patient,  a caucasian right handed female , originally from [place], reports vivid dreams, with fearful frightful content.  Her dreams have let her to act out some of the dream content, she wakes with her arms extended and with palpitations, tachycardia form fright. She kicks and fell out of bed, yelling, calling out . Following  one dream last year,  she injured her face in a fall . She begun covering all sharp edges.  She has polymyalgia rheumatica and was treated with prednisone , gaining weight through this time She goes to bed around 11.15 Pm, after  watching TV in her living room. Falls asleep promptly , and wakes at 12.15 hearing a noise , that seemed to arise form behind her headboard- the noise goes away once she is awake. She could fall asleep again. Her acting out of dreams arises at 4 AM or later in the last stages of sleep and gets more frequently over the last year.  She rises at 8.30, the alarm wakes her at 8.  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 No childhood sleep disorder history. Usually a 7 -8 hour  sleeper , refreshed.  The patient is widowed for 12 years, has adult children and grandchildren.The bruit in her left temple keeps her form sleeping on the side. She has 3 carotid dopplers over 25 years, diagnosed with  tortious vessel.  MRI brain was normal 4 year ago ( Dr. Carloyn Manner)  No MRA was done at the time , I like to rule out a vascular abnormality at the frontal lobe.  08-04-2015 Her night terrors have responded to the medication, and her spells are rare now. She returned from [place] and had one event which "scared her sister to death "  I have spoken to Dr. Delice Lesch about the possible component of cavernous vascular malformations producing a seizure event. I ordered a 48 hour study but the patient underwent a 72 hour study. This was normal- no seizure activity,  She is doing well on Klonopin. She gained weight on klonopin, (a fact she likes!) She reports vertigo, dizzines with rapid head movementns and bending down, also controlled with Klonopin.  Her eye feel as if pulsating - can this be the AVM? HTN was addressed in the last 2 visits with endocrinology / PCP.  She joked today that her weight has been stabile for so long, she had no excuse for a new wardrobe !  She had no spells for the last 4 month. She needs no refills today.  Interval history from 02/03/2016. Gloria Anderson reports today that after being almost free of vivid dreams for several month she has returned having some not to the same intensity and not the same frequency as prior to treatment. Some of them are scary they have the character of the night may or or night terror, and she responds was higher heart rate feeling under stress. Klonopin has been working very well at a very low dose for her by now for over 2 years. I would like to continue using Klonopin with the option that if the medication wears off she could increase the dose. She also does not have insomnia when taking Klonopin. She is not fatigued and she is not  excessively daytime sleepy, she also does not report that she is groggy in the mornings. She feels stable well-balanced and not at a risk of falling. I will refill her medication today. I also congratulated her to her 82th birthday, which she celebrates in style this coming Saturday.  Travel history from 15 February 2017, Gloria Anderson has traveled to summer to [place] with some difficulties.  She had no medical problems however.  She brought me her Klonopin prescription but she had not needed to fill and which is now a year old.  I will refill her Klonopin through [facility] today. She has no complain of grogginess or mental cloudiness in the morning. She has night terrors. She has noted that she is not easily scared , not panicked.   07-26-2017, Gloria Anderson is a 85 y.o. female patient for follow up on  concerns of cognitive function, memory.  She seems well, is alert and happy. She has a full social life.  She is relieved that the [facility] was normal 26-30 points. Balance is reportedly poor, she is careful.  She continues to report hip pain, and has some night spells- but much better than before Klonopin was initiated.  Was seen by Dr. Einar Gip cardiology for BP variability- he asked  Her to start wearing compression stockings and elevate her feet at night. She has not always hydrated well, and she is struggling with HTN and orthostatic lightheadedness.   Today 10/05/17:  Ms. Anderson is an 85 year old female with a history of memory disturbance and night terrors.  She returns today to discuss her balance.  She states that she went to see her primary care after she had an episode while ambulating.  She reports that she was walking with a neighbor.  15 minutes in to her walk she lost sensation in the lower part of her back and in her ankles.  She reports that she felt like she had no control over her balance.  She states that she had to sit down.  She reports after that event she had some mild episodes  but since then this has resolved and she has not had any trouble walking.  She states that she went for a walk last night and did not have any symptoms.  She states that she has found if she uses a cane this offers her more stability.  In the past CT of the lumbar spine as well as MRI of the lumbar spine shows stenosis.  She denies any changes with her bowels or bladder.  Denies any numbness or tingling in the lower extremities.  She returns today for an evaluation.  HISTORY Gloria Anderson is a 85 y.o. female patient for follow up on concerns of cognitive function, memory.  She seems well, is alert and happy. She has a full social life.  She is relieved that the [facility] was normal 26-30 points. Balance is reportedly poor, she is careful.  She continues to report hip pain, and has some night spells- but much better than before Klonopin was initiated.  Was seen by Dr. Einar Gip cardiology for BP variability- he asked  Her to start wearing compression stockings and elevate her feet at night. She has not always hydrated well, and she is struggling with HTN and orthostatic lightheadedness.   10-20-2017, Gloria Anderson, who is  85 years old reports a sleep walking episode that shook her- she dreamt about a scary situation, left her bed , walked about and finally woke up - realized she had dreamt and is not concerned about repeating this episodes with a risk of injury.  She started on Hydralazine as a new BP medication- could this have contributed/ she has been well controlled on Klonopin, and we will add trazodone tonight.   Interval history from 30 January 2018, I have the pleasure of meeting today with Gloria Anderson, this is not an emergency visit but a visit we had on our books for a while.  The patient performed today and a Montreal cognitive assessment and she scored 27 out of 30 points which is an entirely normal result.  Especially her short-term recall is excellent.  She had no trouble with naming  objects and doing mathematics or arithmetic.  She had normal attention she has some trouble with visual-spatial crest such as drawing or during the Trail Making Test.  This is unchanged  to previous results her Epworth sleepiness score was endorsed at 10 and her fatigue severity score was not endorsed.  Her medication list is available, she is using Klonopin 0.5 mg half tablet by mouth at bedtime. Sleep is fragmented but she feels its related to bathroom breaks. She reports nocturia 5-6 times, and she drinks water all day. No burning and no foul smelling urine.    RV 01-09-2019, Gloria Anderson is a meanwhile 85 year old lang time patient whom I refereed toDr Vertell Limber for pain- Gloria Anderson  is a 85 y.o. female  with essential hypertension and orthostatic hypotension, mild hyperlipidemia, moderate MR and TR, moderate pulmonary hypertension presents here for 6 month follow-up of hypertension and valvular heart disease.  She had back surgery in June 2020 and has not had back pain since then. 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 .  She constantly checks blood pressure and gets worried about elevated blood pressure, I had multiple phone calls in the past. Presently doing well and except for dizziness when she stands up or is on her feet for long period of time, remains asymptomatic, back pain has resolved. Since surgery she had some lightheadedness. Her HTN today is very well controlled, she is wearing compression stockings.     Review of Systems: Out of a complete 14 system review, the patient complains of only the following symptoms, and all other reviewed systems are negative. Acting out dreams, yelling but not leaving the bed. Sleeps 7- 8 hours, nocturia.   Sleep walking- no repeat since last visit 01-30-2018 .  Increased anxiety -Klonopin dependent , but this medication was needed to control her parasomnia.   Concerned about memory-  MOCA was 26/ 30 points.      Social History   Socioeconomic History  . Marital status: Widowed    Spouse name: Not on file  . Number of children: 2  . Years of education: HS  . Highest education level: Not on file  Occupational History  . Occupation: RETIRED    Employer: [employer]  . Financial resource strain: Not on file  . Food insecurity    Worry: Not on file    Inability: Not on file  . Transportation needs    Medical: Not on file    Non-medical: Not on file  Tobacco Use  . Smoking status: Never Smoker  . Smokeless tobacco: Never Used  Substance and Sexual Activity  . Alcohol use: No    Alcohol/week: 0.0 standard drinks  . Drug use: Never  . Sexual activity: Not on file  Lifestyle  . Physical activity    Days per week: Not on file    Minutes per session: Not on file  . Stress: Not on file  Relationships  . Social Herbalist on phone: Not on file    Gets together: Not on file    Attends religious service: Not on file    Active member of club or organization: Not on file    Attends meetings of clubs or organizations: Not on file    Relationship status: Not on file  . Intimate partner violence    Fear of current or ex partner: Not on file    Emotionally abused: Not on file    Physically abused: Not on file    Forced sexual activity: Not on file  Other Topics Concern  . Not on file  Social  History Narrative   Patient is widowed.   Patient has two children.   Patient does not drink any caffeine.    Patient has a high school education.   Patient is right-handed.             Family History  Problem Relation Age of Onset  . CVA Father   . Tuberculosis Mother   . Lung cancer Brother     Past Medical History:  Diagnosis Date  . Arthritis   . Complication of anesthesia   . Constipation   . Depression   . GERD (gastroesophageal reflux disease)   . Hypertension   . Hypothyroidism   . Osteoporosis   . Pneumonia 2018  .  Polymyalgia ([place])   . Polymyalgia rheumatica ([place])   . PONV (postoperative nausea and vomiting)     Past Surgical History:  Procedure Laterality Date  . COLONOSCOPY W/ POLYPECTOMY    . EYE SURGERY     both cataracts  . LUMBAR LAMINECTOMY/DECOMPRESSION MICRODISCECTOMY N/A 08/15/2018   Procedure: Lumbar three to Lumbar five Decompressive lumbar laminectomy;  Surgeon: Erline Levine, MD;  Location: [facility];  Service: Neurosurgery;  Laterality: N/A;  . NASAL SINUS SURGERY    . SHOULDER SURGERY Right 2007   repair  . TONSILLECTOMY    . TRIGGER FINGER RELEASE  03/19/2011   Procedure:  left middle finger RELEASE TRIGGER FINGER/A-1 PULLEY;  Surgeon: Cammie Sickle., MD;  Location: [facility];  Service: Orthopedics;  Laterality: Right;  Procedure:  Release Right Long and Ring Trigger Fingers, Release Left Long Trigger Finger, Injection Left Long Proximal Phalangeal Joint  . TRIGGER FINGER RELEASE Right    Ring finger, middle finger  . UPPER GASTROINTESTINAL ENDOSCOPY      Current Outpatient Medications  Medication Sig Dispense Refill  . antiseptic oral rinse (BIOTENE) LIQD 15 mLs by Mouth Rinse route 3 (three) times daily as needed for dry mouth.     Marland Kitchen atorvastatin (LIPITOR) 10 MG tablet Take 10 mg by mouth daily.     Marland Kitchen BIOTIN FORTE PO Take 1 tablet by mouth daily.     . Calcium Citrate (CITRACAL PO) Take 1,200 mg by mouth daily.     . Carboxymethylcell-Hypromellose (GENTEAL OP) Place 1 drop into both eyes 2 (two) times daily as needed.     . carvedilol (COREG) 3.125 MG tablet Take 1 tablet (3.125 mg total) by mouth 2 (two) times daily. 180 tablet 1  . clonazePAM (KLONOPIN) 0.5 MG tablet Take half tablet at bedtime as needed (0.25 MG) (Patient taking differently: Take 0.5 mg by mouth at bedtime. Take half tablet at bedtime as needed (0.25 MG)) 90 tablet 1  . denosumab (PROLIA) 60 MG/ML SOSY injection Inject 60 mg into the skin every 6 (six) months.    Gloria Anderson Calcium (STOOL  SOFTENER PO) Take 100 mg by mouth daily.     . fish oil-omega-3 fatty acids 1000 MG capsule Take 1 g by mouth daily.     . Garlic [id number] MG TBEC Take 2,000 mg by mouth daily.     . hydrALAZINE (APRESOLINE) 25 MG tablet Take 1 tablet (25 mg total) by mouth 3 (three) times daily as needed (blood pressure >150). 270 tablet 3  . hydroxychloroquine (PLAQUENIL) 200 MG tablet TAKE 1 TABLET BY MOUTH  DAILY 90 tablet 0  . Iron-Vitamins (GERITOL PO) Take 1 tablet by mouth daily.     Marland Kitchen levothyroxine (SYNTHROID, LEVOTHROID) 50 MCG tablet Take 50 mcg  by mouth daily before breakfast.     . Lifitegrast (XIIDRA OP) Apply 1 drop to eye 2 (two) times daily.    Marland Kitchen omeprazole (PRILOSEC) 20 MG capsule Take 20 mg by mouth daily.    . polyethylene glycol (MIRALAX / GLYCOLAX) packet Take 17 g by mouth daily as needed for moderate constipation.     . Probiotic Product (PROBIOTIC DAILY PO) Take 1 tablet by mouth daily.     No current facility-administered medications for this visit.     Allergies as of 01/09/2019  . (No Known Allergies)    Vitals: BP 121/78   Pulse 82   Temp 98 F (36.7 C)   Ht 5\' 4"  (1.626 m)   Wt 119 lb (54 kg)   BMI 20.43 kg/m  Last Weight:  Wt Readings from Last 1 Encounters:  01/09/19 119 lb (54 kg)   Last Height:   Ht Readings from Last 1 Encounters:  01/09/19 5\' 4"  (1.626 m)    Physical exam:  General: The patient is awake, alert and appears not in acute distress. The patient is well groomed. Head: Normocephalic, atraumatic. Neck is supple. Mallampati 3 , retrognathia , neck circumference:14. 25". inches.  Cardiovascular:  Regular rate and rhythm , without  murmurs , has a left sided carotid bruit, no distended neck veins. Respiratory: Lungs are clear to auscultation. Skin:  Without evidence of edema, or rash Trunk:  normal posture.  Neurologic exam : [facility] Cognitive Assessment  01/09/2019 01/30/2018 07/26/2017 07/26/2017 02/03/2015  Visuospatial/ Executive (0/5) 2 3 3 3 4    Naming (0/3) 3 3 3 3 3   Attention: Read list of digits (0/2) 1 2 2 2 2   Attention: Read list of letters (0/1) 1 1 1 1 1   Attention: Serial 7 subtraction starting at 100 (0/3) 3 3 3 3 3   Language: Repeat phrase (0/2) 2 2 1 1 2   Language : Fluency (0/1) 1 0 0 0 1  Abstraction (0/2) 2 2 2 2 2   Delayed Recall (0/5) 5 5 5 5 3   Orientation (0/6) 6 6 6 6 6   Total 26 27 26 26 27   Adjusted Score (based on education) - - - - 27    The patient is awake and alert, oriented to place and time.  Memory see above; MOCA 26-30,  There is a normal attention span & concentration ability. Speech is fluent with mild hoarseness  .Mood and affect are appropriate. Cranial nerves: Pupils are equal and briskly reactive to light. Hearing to finger rub intact.   Facial sensation intact to fine touch. Facial motor strength is symmetric and tongue and uvula move midline.  Gloria Anderson gait is still  very measured but not at all of balance, she can turn with 3 steps 180 degrees, she does not have a drift to the left or right there was no propulsive tendency noted, she could rise from a seated position in a chair without bracing herself, there is no tremor noted normal arm swing.  There is also no focal weakness, focal sensory loss and good coordination.  Handwriting and drawing do not reveal any changes.  Cognitive testing as below was unremarkable.  The patient performed a Montreal cognitive assessment again- which is more difficult than the Mini-Mental Status Examination- and mastered it again well.   The patient reported she hasn't felt well in a while, she has been impatient. She has back pain, had a CT scan ordered by dr Forde Dandy.  L3-4 and L 4-5  severe stenosis and likely foraminal stenosis as well.  Buttocks feel numb - claudication with need to rest. Referral to Dr. Ellene Route, Vertell Limber [facility]  After physical and neurologic examination, review of laboratory studies, imaging, neurophysiology testing and pre-existing  records, assessment is :   PLAN:   stay on Klonopin, low dose 0.25 mg nightly, can use 2 tabs if needed for anxiety.  . no insomnia and much less vivid dreams.  Her MRi documented : intracerebral  venous- cavernous malformation. We discussed possible seizure activity- sometimes in form of vivid dreams.   Continue with water aerobics and continue to be active, may start dancing again.  I thank Dr. Vertell Limber, neurosurgery his successfull umbar stenosis surgery   I will add Trazodone to the Klonopin- both drugs to control night terrors ,now sleep walking.  Caveat - Alcohol reduces the threshold to sleep walking. RV every 12 month alternating NP and me- MOCA next time in May 2021.  Larey Seat, MD     01-30-2018   Cc Dr Forde Dandy ,  Dr Einar Gip.  Dr Vertell Limber.

## 2019-01-16 ENCOUNTER — Other Ambulatory Visit: Payer: Self-pay

## 2019-01-16 ENCOUNTER — Ambulatory Visit: Payer: Medicare Other | Attending: Neurosurgery | Admitting: Physical Therapy

## 2019-01-16 ENCOUNTER — Encounter: Payer: Self-pay | Admitting: Physical Therapy

## 2019-01-16 DIAGNOSIS — M545 Low back pain, unspecified: Secondary | ICD-10-CM

## 2019-01-16 DIAGNOSIS — G8929 Other chronic pain: Secondary | ICD-10-CM | POA: Diagnosis present

## 2019-01-16 DIAGNOSIS — R293 Abnormal posture: Secondary | ICD-10-CM | POA: Diagnosis present

## 2019-01-16 DIAGNOSIS — M6281 Muscle weakness (generalized): Secondary | ICD-10-CM | POA: Insufficient documentation

## 2019-01-16 NOTE — Patient Instructions (Signed)
Access Code: [email or web address]  URL: [email or web address]  Date: 01/16/2019  Prepared by: Jari Favre   Exercises  Seated Hamstring Stretch - 3 reps - 1 sets - 30 sec hold - 1x daily - 7x weekly  Supine Bridge - 10 reps - 3 sets - 1x daily - 7x weekly

## 2019-01-16 NOTE — Therapy (Signed)
[facility] [address], [place], [place], [address] Phone: [phone]   Fax:  [phone]  Physical Therapy Evaluation  Patient Details  Name: Gloria Anderson MRN: [id number] Date of Birth: 03/06/1933 Referring Provider (PT): Erline Levine, MD   Encounter Date: 01/16/2019  PT End of Session - 01/16/19 1451    Visit Number  1    Date for PT Re-Evaluation  03/13/19    PT Start Time  1017    PT Stop Time  1103    PT Time Calculation (min)  46 min    Activity Tolerance  Patient tolerated treatment well    Behavior During Therapy  [facility] for tasks assessed/performed       Past Medical History:  Diagnosis Date  . Arthritis   . Complication of anesthesia   . Constipation   . Depression   . GERD (gastroesophageal reflux disease)   . Hypertension   . Hypothyroidism   . Osteoporosis   . Pneumonia 2018  . Polymyalgia ([place])   . Polymyalgia rheumatica ([place])   . PONV (postoperative nausea and vomiting)     Past Surgical History:  Procedure Laterality Date  . COLONOSCOPY W/ POLYPECTOMY    . EYE SURGERY     both cataracts  . LUMBAR LAMINECTOMY/DECOMPRESSION MICRODISCECTOMY N/A 08/15/2018   Procedure: Lumbar three to Lumbar five Decompressive lumbar laminectomy;  Surgeon: Erline Levine, MD;  Location: [place];  Service: Neurosurgery;  Laterality: N/A;  . NASAL SINUS SURGERY    . SHOULDER SURGERY Right 2007   repair  . TONSILLECTOMY    . TRIGGER FINGER RELEASE  03/19/2011   Procedure:  left middle finger RELEASE TRIGGER FINGER/A-1 PULLEY;  Surgeon: Cammie Sickle., MD;  Location: [place];  Service: Orthopedics;  Laterality: Right;  Procedure:  Release Right Long and Ring Trigger Fingers, Release Left Long Trigger Finger, Injection Left Long Proximal Phalangeal Joint  . TRIGGER FINGER RELEASE Right    Ring finger, middle finger  . UPPER GASTROINTESTINAL ENDOSCOPY      There were no vitals filed  for this visit.   Subjective Assessment - 01/16/19 1022    Subjective  Pt has been feeling a little discomfort for the last 3 weeks and wants to make.   Pt taking eye drop called xiidra - cannot find   Pertinent History  laminectomy 07/2018, hx of back pain, chronic pain, osteoporosis    Limitations  Walking    How long can you walk comfortably?  40-45 minutes    Patient Stated Goals  keep from having more pain    Currently in Pain?  Yes   not currently   Pain Score  1     Pain Location  Back    Pain Orientation  Mid;Lower;Right    Pain Descriptors / Indicators  Discomfort;Dull    Pain Type  Chronic pain    Aggravating Factors   varies, feel "pulling" when I lean forward    Pain Relieving Factors  lumbar support when sitting    Multiple Pain Sites  No         [facility] PT Assessment - 01/16/19 0001      Assessment   Medical Diagnosis  M54.16 (ICD-10-CM) - Radiculopathy, lumbar region    Referring Provider (PT)  Erline Levine, MD    Onset Date/Surgical Date  --   3 weeks ago (new pain)   Prior Therapy  no      Precautions  Precautions  None      Restrictions   Weight Bearing Restrictions  No      Balance Screen   Has the patient fallen in the past 6 months  No   has felt off balance and had some stumbling     [place] residence    Living Arrangements  Alone      Prior Function   Level of Independence  Independent    Leisure  used to go to exercise classes at the gym      Cognition   Overall Cognitive Status  Within Functional Limits for tasks assessed      Observation/Other Assessments   Focus on Therapeutic Outcomes (FOTO)   21%   20% predicted     Posture/Postural Control   Posture/Postural Control  Postural limitations    Postural Limitations  Rounded Shoulders;Increased thoracic kyphosis;Flexed trunk    Posture Comments  sacral sitting with Rt leg crossed over left during the entire eval until educated on posture       ROM / Strength   AROM / PROM / Strength  AROM;Strength      AROM   Overall AROM Comments  lumbar flexion 60%      Strength   Overall Strength Comments  core 4+/5 had difficulty keeping pelvis neutral during SLR; Rt hip weakness>Lt but overall 5/5      Flexibility   Soft Tissue Assessment /Muscle Length  yes    Hamstrings  60% bilateral      Palpation   SI assessment   normal    Palpation comment  bilateral gluteals TTP Rt>Lt      Special Tests    Special Tests  Lumbar    Lumbar Tests  Straight Leg Raise      Straight Leg Raise   Findings  Negative      Ambulation/Gait   Gait Pattern  Decreased stride length;Trunk flexed                Objective measurements completed on examination: See above findings.      [place] Adult PT Treatment/Exercise - 01/16/19 0001      Self-Care   Self-Care  Other Self-Care Comments    Other Self-Care Comments   educatd and performed correct sitting posture and intial HEP             PT Education - 01/16/19 1440    Education Details  Access Code: [facility]    Person(s) Educated  Patient    Methods  Explanation;Demonstration;Verbal cues;Handout    Comprehension  Verbalized understanding;Returned demonstration       PT Short Term Goals - 01/16/19 1453      PT SHORT TERM GOAL #1   Title  ind with initial HEP    Time  4    Period  Weeks    Status  New    Target Date  02/13/19      PT SHORT TERM GOAL #2   Title  Pt will report 25% less pain    Time  4    Period  Weeks    Target Date  02/13/19      PT SHORT TERM GOAL #3   Title  pt will perform dynamic gait index with appropriate goal made if needed    Time  4    Period  Weeks    Status  New    Target Date  02/13/19  PT Long Term Goals - 01/16/19 1456      PT LONG TERM GOAL #1   Title  pt will be ind with HEP for strengthening in order to regain and maintain strength lost since COVID preventing her from attending regular exercise classes.    Time  8     Period  Weeks    Status  New    Target Date  03/13/19      PT LONG TERM GOAL #2   Title  Pt will be able to perform single leg stand with UE movements x 10 seconds bilaterally for improved balance    Time  8    Period  Weeks    Status  New    Target Date  03/13/19      PT LONG TERM GOAL #3   Title  pt will demonstrate improved upright posture in standing and sitting throughout the day due to improved core strength and awareness of posture    Time  8    Period  Weeks    Status  New    Target Date  03/13/19      PT LONG TERM GOAL #4   Title  Pt will report pain frequency reduced by 75% less pain    Time  8    Period  Weeks    Status  New    Target Date  03/13/19             Plan - 01/16/19 1440    Clinical Impression Statement  Pt presents with low back pain that began 3 weeks ago.  Pt had back surgery in June 2020 and wants to make sure she is doing things correctly to avoid recurrance of back pain.  Pt has had no pain since surgery other than this mild low back pain she is now reporting.  Pt demosntrates some core weakness of 4+/5 and has poor posture which causes her to overuse her lumbar erectors without engaging her abdominal muscles.  Pt has rounded shoulder, sacral sits, and crosses her Rt leg over her Lt.  Pt has inceased "tension or pulling" when doing lumbar flexion for long periods of time.  Pain is mild at 1/10 when she has it.  Pt aslo had LOB and needed min assist to correct after standing from lying supine.  She states she will have LOB occasionally and needs to grab something.  Pt does have some LE weakness Rt>Lt and can peform single leg stand for only 3 sec on the Rt LE.  Pt will benefit from skilled PT to address posture, core strength, and blance in order to ensure safe return to functional activities.    Personal Factors and Comorbidities  Comorbidity 3+    Comorbidities  hx of back pain, chronic pain, osteoporosis    Stability/Clinical Decision Making   Stable/Uncomplicated    Clinical Decision Making  Low    Rehab Potential  Excellent    PT Frequency  2x / week    PT Duration  8 weeks   reduce to 1x/week after 4 weeks if PT is still needed   PT Treatment/Interventions  ADLs/Self Care Home Management;Cryotherapy;Electrical Stimulation;Moist Heat;Therapeutic exercise;Therapeutic activities;Neuromuscular re-education;Patient/family education;Manual techniques;Passive range of motion;Taping;Dry needling    PT Next Visit Plan  extension exercises, hamstring stretch, posture, core strengthening, STM to gltues    PT Home Exercise Plan  Access Code: [email or web address]    Consulted and Agree with Plan of Care  Patient  Patient will benefit from skilled therapeutic intervention in order to improve the following deficits and impairments:  Pain, Postural dysfunction, Decreased strength, Impaired flexibility, Increased muscle spasms  Visit Diagnosis: Chronic right-sided low back pain, unspecified whether sciatica present  Muscle weakness (generalized)  Abnormal posture     Problem List Patient Active Problem List   Diagnosis Date Noted  . Adult night terrors 01/09/2019  . Degenerative lumbar spinal stenosis 08/15/2018  . Memory difficulty 07/26/2017  . Other parasomnia 07/26/2017  . Anemia 06/02/2017  . Osteopenia 06/02/2017  . Spinal stenosis 06/02/2017  . Iron deficiency anemia secondary to inadequate dietary iron intake 08/05/2016  . Fatigue associated with anemia 08/05/2016  . Primary osteoarthritis of both feet 02/04/2016  . Essential hypertension 02/04/2016  . PMR (polymyalgia rheumatica) (HCC) 02/03/2016  . Osteoarthritis, hand 02/03/2016  . Age-related osteoporosis without current pathological fracture 02/03/2016  . DDD (degenerative disc disease), lumbar 02/03/2016  . High risk medication use 02/03/2016  . 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 Cerebral cavernous malformation type 1  12/04/2013  . Night terrors, adult 09/26/2013  . HEMORRHOIDS-EXTERNAL 09/19/2009  . GERD 09/19/2009  . CONSTIPATION 09/19/2009  . DYSPHAGIA 09/19/2009  . PERSONAL HISTORY OF COLONIC POLYPS 09/19/2009    Jule Ser, PT 01/16/2019, 3:12 PM  [facility] [address] [place], [place], [address] Phone: [phone]   Fax:  [phone]  Name: Haruko Hern MRN: [id number] Date of Birth: September 30, 1933

## 2019-01-17 NOTE — Telephone Encounter (Signed)
error 

## 2019-01-22 ENCOUNTER — Other Ambulatory Visit: Payer: Self-pay

## 2019-01-22 ENCOUNTER — Encounter: Payer: Self-pay | Admitting: Physical Therapy

## 2019-01-22 ENCOUNTER — Ambulatory Visit: Payer: Medicare Other | Admitting: Physical Therapy

## 2019-01-22 DIAGNOSIS — M545 Low back pain, unspecified: Secondary | ICD-10-CM

## 2019-01-22 DIAGNOSIS — R293 Abnormal posture: Secondary | ICD-10-CM

## 2019-01-22 DIAGNOSIS — M6281 Muscle weakness (generalized): Secondary | ICD-10-CM

## 2019-01-22 DIAGNOSIS — G8929 Other chronic pain: Secondary | ICD-10-CM

## 2019-01-22 NOTE — Therapy (Signed)
[facility] [address] [place], [place], [address] Phone: [phone]   Fax:  [phone]  Physical Therapy Treatment  Patient Details  Name: Gloria Anderson MRN: [id number] Date of Birth: 09-08-1933 Referring Provider (PT): Erline Levine, MD   Encounter Date: 01/22/2019  PT End of Session - 01/22/19 1219    Visit Number  2    Date for PT Re-Evaluation  03/13/19    PT Start Time  [id number]    PT Stop Time  1223    PT Time Calculation (min)  38 min    Activity Tolerance  Patient tolerated treatment well    Behavior During Therapy  [facility] for tasks assessed/performed       Past Medical History:  Diagnosis Date  . Arthritis   . Complication of anesthesia   . Constipation   . Depression   . GERD (gastroesophageal reflux disease)   . Hypertension   . Hypothyroidism   . Osteoporosis   . Pneumonia 2018  . Polymyalgia ([place])   . Polymyalgia rheumatica ([place])   . PONV (postoperative nausea and vomiting)     Past Surgical History:  Procedure Laterality Date  . COLONOSCOPY W/ POLYPECTOMY    . EYE SURGERY     both cataracts  . LUMBAR LAMINECTOMY/DECOMPRESSION MICRODISCECTOMY N/A 08/15/2018   Procedure: Lumbar three to Lumbar five Decompressive lumbar laminectomy;  Surgeon: Erline Levine, MD;  Location: [place];  Service: Neurosurgery;  Laterality: N/A;  . NASAL SINUS SURGERY    . SHOULDER SURGERY Right 2007   repair  . TONSILLECTOMY    . TRIGGER FINGER RELEASE  03/19/2011   Procedure:  left middle finger RELEASE TRIGGER FINGER/A-1 PULLEY;  Surgeon: Cammie Sickle., MD;  Location: [place];  Service: Orthopedics;  Laterality: Right;  Procedure:  Release Right Long and Ring Trigger Fingers, Release Left Long Trigger Finger, Injection Left Long Proximal Phalangeal Joint  . TRIGGER FINGER RELEASE Right    Ring finger, middle finger  . UPPER GASTROINTESTINAL ENDOSCOPY      There were no vitals filed  for this visit.  Subjective Assessment - 01/22/19 1149    Subjective  I think I am not as good as I WAS.Marland Kitchenbut I still am better than before surgery.    Pertinent History  laminectomy 07/2018, hx of back pain, chronic pain, osteoporosis    Currently in Pain?  Yes    Pain Score  1     Pain Location  Back    Pain Orientation  Right;Lower    Pain Descriptors / Indicators  Sore    Multiple Pain Sites  No                       [facility] Adult PT Treatment/Exercise - 01/22/19 0001      Lumbar Exercises: Stretches   Active Hamstring Stretch  Right;Left;2 reps;20 seconds    Active Hamstring Stretch Limitations  Good review and performance      Lumbar Exercises: Supine   Bent Knee Raise  10 reps    Bent Knee Raise Limitations  added to HEP    Bridge  10 reps;2 seconds    Bridge Limitations  excellent form/control             PT Education - 01/22/19 1202    Education Details  Standing rows and extensions red band for HEP progression    Person(s) Educated  Patient    Methods  Explanation;Demonstration;Tactile  cues;Verbal cues;Handout    Comprehension  Returned demonstration;Verbalized understanding       PT Short Term Goals - 01/22/19 1201      PT SHORT TERM GOAL #1   Title  ind with initial HEP    Time  4    Period  Weeks    Status  Achieved    Target Date  02/13/19        PT Long Term Goals - 01/16/19 1456      PT LONG TERM GOAL #1   Title  pt will be ind with HEP for strengthening in order to regain and maintain strength lost since COVID preventing her from attending regular exercise classes.    Time  8    Period  Weeks    Status  New    Target Date  03/13/19      PT LONG TERM GOAL #2   Title  Pt will be able to perform single leg stand with UE movements x 10 seconds bilaterally for improved balance    Time  8    Period  Weeks    Status  New    Target Date  03/13/19      PT LONG TERM GOAL #3   Title  pt will demonstrate improved upright posture  in standing and sitting throughout the day due to improved core strength and awareness of posture    Time  8    Period  Weeks    Status  New    Target Date  03/13/19      PT LONG TERM GOAL #4   Title  Pt will report pain frequency reduced by 75% less pain    Time  8    Period  Weeks    Status  New    Target Date  03/13/19            Plan - 01/22/19 1144    Clinical Impression Statement  Pt arrives slightly worried she "is not doing as well as I thought I was." She has very little soreness along her right low back that does not prevent her from performing her current ADLs. we discussed at length the importance of strengthening her muscles after 10+ years of stenosis. She is independnet and compliant with her initial HEP. We added/progress the HEP to include standing trunk extension strengthening and some level 1, basic core strength in supine. Pt would like to be abl eto get off teh floor better.    Personal Factors and Comorbidities  Comorbidity 3+    Comorbidities  hx of back pain, chronic pain, osteoporosis    Stability/Clinical Decision Making  Stable/Uncomplicated    Rehab Potential  Excellent    PT Frequency  2x / week    PT Duration  8 weeks    PT Treatment/Interventions  ADLs/Self Care Home Management;Cryotherapy;Electrical Stimulation;Moist Heat;Therapeutic exercise;Therapeutic activities;Neuromuscular re-education;Patient/family education;Manual techniques;Passive range of motion;Taping;Dry needling    PT Next Visit Plan  review new HEP, add sit to stands to HEP    PT Home Exercise Plan  Access Code: [email or web address]    Consulted and Agree with Plan of Care  Patient       Patient will benefit from skilled therapeutic intervention in order to improve the following deficits and impairments:  Pain, Postural dysfunction, Decreased strength, Impaired flexibility, Increased muscle spasms  Visit Diagnosis: Chronic right-sided low back pain, unspecified whether sciatica  present  Muscle weakness (generalized)  Abnormal posture     Problem List Patient  Active Problem List   Diagnosis Date Noted  . Adult night terrors 01/09/2019  . Degenerative lumbar spinal stenosis 08/15/2018  . Memory difficulty 07/26/2017  . Other parasomnia 07/26/2017  . Anemia 06/02/2017  . Osteopenia 06/02/2017  . Spinal stenosis 06/02/2017  . Iron deficiency anemia secondary to inadequate dietary iron intake 08/05/2016  . Fatigue associated with anemia 08/05/2016  . Primary osteoarthritis of both feet 02/04/2016  . Essential hypertension 02/04/2016  . PMR (polymyalgia rheumatica) (HCC) 02/03/2016  . Osteoarthritis, hand 02/03/2016  . Age-related osteoporosis without current pathological fracture 02/03/2016  . DDD (degenerative disc disease), lumbar 02/03/2016  . High risk medication use 02/03/2016  . 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 Cerebral cavernous malformation type 1 12/04/2013  . Night terrors, adult 09/26/2013  . HEMORRHOIDS-EXTERNAL 09/19/2009  . GERD 09/19/2009  . CONSTIPATION 09/19/2009  . DYSPHAGIA 09/19/2009  . PERSONAL HISTORY OF COLONIC POLYPS 09/19/2009    Karel Turpen, PTA 01/22/2019, 12:32 PM  [facility] [address] [place], [place], [address] Phone: [phone]   Fax:  [phone]  Name: Gloria Anderson MRN: [id number] Date of Birth: [birth year]  Access Code: [facility]  URL: [email or web address]  Date: 01/22/2019  Prepared by: Myrene Galas   Exercises  Seated Hamstring Stretch - 3 reps - 1 sets - 30 sec hold - 1x daily - 7x weekly  Supine Bridge - 10 reps - 3 sets - 1x daily - 7x weekly  Standing Row with Anchored Resistance - 15 reps - 2 sets - 2x daily - 7x weekly  Single Arm Shoulder Extension with Anchored Resistance - 15 reps - 2 sets - 1x daily - 7x weekly  Supine March - 20 reps - 1 sets -  2x daily - 7x weekly

## 2019-01-29 ENCOUNTER — Encounter: Payer: Self-pay | Admitting: Physical Therapy

## 2019-01-29 ENCOUNTER — Ambulatory Visit: Payer: Medicare Other | Admitting: Physical Therapy

## 2019-01-29 ENCOUNTER — Other Ambulatory Visit: Payer: Self-pay

## 2019-01-29 DIAGNOSIS — M545 Low back pain, unspecified: Secondary | ICD-10-CM

## 2019-01-29 DIAGNOSIS — R293 Abnormal posture: Secondary | ICD-10-CM

## 2019-01-29 DIAGNOSIS — M6281 Muscle weakness (generalized): Secondary | ICD-10-CM

## 2019-01-29 DIAGNOSIS — G8929 Other chronic pain: Secondary | ICD-10-CM

## 2019-01-29 NOTE — Therapy (Signed)
[facility] [address], [place], [place], [address] Phone: [phone]   Fax:  [phone]  Physical Therapy Treatment  Patient Details  Name: Gloria Anderson MRN: [id number] Date of Birth: 06/19/1933 Referring Provider (PT): Erline Levine, MD   Encounter Date: 01/29/2019  PT End of Session - 01/29/19 1153    Visit Number  3    Date for PT Re-Evaluation  03/13/19    PT Start Time  1143    PT Stop Time  1227    PT Time Calculation (min)  44 min    Activity Tolerance  Patient tolerated treatment well    Behavior During Therapy  [facility] for tasks assessed/performed       Past Medical History:  Diagnosis Date  . Arthritis   . Complication of anesthesia   . Constipation   . Depression   . GERD (gastroesophageal reflux disease)   . Hypertension   . Hypothyroidism   . Osteoporosis   . Pneumonia 2018  . Polymyalgia ([place])   . Polymyalgia rheumatica ([place])   . PONV (postoperative nausea and vomiting)     Past Surgical History:  Procedure Laterality Date  . COLONOSCOPY W/ POLYPECTOMY    . EYE SURGERY     both cataracts  . LUMBAR LAMINECTOMY/DECOMPRESSION MICRODISCECTOMY N/A 08/15/2018   Procedure: Lumbar three to Lumbar five Decompressive lumbar laminectomy;  Surgeon: Erline Levine, MD;  Location: [place];  Service: Neurosurgery;  Laterality: N/A;  . NASAL SINUS SURGERY    . SHOULDER SURGERY Right 2007   repair  . TONSILLECTOMY    . TRIGGER FINGER RELEASE  03/19/2011   Procedure:  left middle finger RELEASE TRIGGER FINGER/A-1 PULLEY;  Surgeon: Cammie Sickle., MD;  Location: [place];  Service: Orthopedics;  Laterality: Right;  Procedure:  Release Right Long and Ring Trigger Fingers, Release Left Long Trigger Finger, Injection Left Long Proximal Phalangeal Joint  . TRIGGER FINGER RELEASE Right    Ring finger, middle finger  . UPPER GASTROINTESTINAL ENDOSCOPY      There were no vitals filed  for this visit.  Subjective Assessment - 01/29/19 1154    Subjective  Doing HEP, the shoulder one hurts my Lt shoulder. Otherwise doing ok. No current pain.    Pertinent History  laminectomy 07/2018, hx of back pain, chronic pain, osteoporosis    Currently in Pain?  No/denies    Multiple Pain Sites  No                       [facility] Adult PT Treatment/Exercise - 01/29/19 0001      Lumbar Exercises: Stretches   Active Hamstring Stretch  Right;Left;2 reps;20 seconds      Lumbar Exercises: Standing   Row  Strengthening;Left;10 reps;Theraband    Theraband Level (Row)  Level 2 (Red)    Shoulder Extension  Strengthening;Both;10 reps;Theraband    Theraband Level (Shoulder Extension)  Level 2 (Red)      Lumbar Exercises: Supine   Clam  10 reps    Clam Limitations  red band, added to HEP    Bent Knee Raise  15 reps    Bent Knee Raise Limitations  TC at pelvis for stabiliization    Bridge  10 reps;3 seconds               PT Short Term Goals - 01/22/19 1201      PT SHORT TERM GOAL #1   Title  ind with initial HEP    Time  4    Period  Weeks    Status  Achieved    Target Date  02/13/19        PT Long Term Goals - 01/16/19 1456      PT LONG TERM GOAL #1   Title  pt will be ind with HEP for strengthening in order to regain and maintain strength lost since COVID preventing her from attending regular exercise classes.    Time  8    Period  Weeks    Status  New    Target Date  03/13/19      PT LONG TERM GOAL #2   Title  Pt will be able to perform single leg stand with UE movements x 10 seconds bilaterally for improved balance    Time  8    Period  Weeks    Status  New    Target Date  03/13/19      PT LONG TERM GOAL #3   Title  pt will demonstrate improved upright posture in standing and sitting throughout the day due to improved core strength and awareness of posture    Time  8    Period  Weeks    Status  New    Target Date  03/13/19      PT LONG  TERM GOAL #4   Title  Pt will report pain frequency reduced by 75% less pain    Time  8    Period  Weeks    Status  New    Target Date  03/13/19            Plan - 01/29/19 1154    Clinical Impression Statement  Pt reports overall she is doing well. She feels better about her progress and outlook. She tends to be one who overdoes, as example she tripled up her HEP in hopes this would be "better" for her. This resulted in increased Lt shoulder discomfort. We reviewed all her exercises, emphasizing that more is not always better. She reluctantly agreed. We added resisted clamshells in supine to her current plan.    Personal Factors and Comorbidities  Comorbidity 3+    Comorbidities  hx of back pain, chronic pain, osteoporosis    Stability/Clinical Decision Making  Stable/Uncomplicated    Rehab Potential  Excellent    PT Frequency  2x / week    PT Duration  8 weeks    PT Treatment/Interventions  ADLs/Self Care Home Management;Cryotherapy;Electrical Stimulation;Moist Heat;Therapeutic exercise;Therapeutic activities;Neuromuscular re-education;Patient/family education;Manual techniques;Passive range of motion;Taping;Dry needling    PT Next Visit Plan  Review HEP, possibly add to it, consider sit to stand.  Pt may want next visit to be her last.    PT Home Exercise Plan  Access Code: [facility]       Patient will benefit from skilled therapeutic intervention in order to improve the following deficits and impairments:  Pain, Postural dysfunction, Decreased strength, Impaired flexibility, Increased muscle spasms  Visit Diagnosis: Chronic right-sided low back pain, unspecified whether sciatica present  Muscle weakness (generalized)  Abnormal posture     Problem List Patient Active Problem List   Diagnosis Date Noted  . Adult night terrors 01/09/2019  . Degenerative lumbar spinal stenosis 08/15/2018  . Memory difficulty 07/26/2017  . Other parasomnia 07/26/2017  . Anemia 06/02/2017   . Osteopenia 06/02/2017  . Spinal stenosis 06/02/2017  . Iron deficiency anemia secondary to inadequate dietary iron intake 08/05/2016  . Fatigue  associated with anemia 08/05/2016  . Primary osteoarthritis of both feet 02/04/2016  . Essential hypertension 02/04/2016  . PMR (polymyalgia rheumatica) (HCC) 02/03/2016  . Osteoarthritis, hand 02/03/2016  . Age-related osteoporosis without current pathological fracture 02/03/2016  . DDD (degenerative disc disease), lumbar 02/03/2016  . High risk medication use 02/03/2016  . 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 Cerebral cavernous malformation type 1 12/04/2013  . Night terrors, adult 09/26/2013  . HEMORRHOIDS-EXTERNAL 09/19/2009  . GERD 09/19/2009  . CONSTIPATION 09/19/2009  . DYSPHAGIA 09/19/2009  . PERSONAL HISTORY OF COLONIC POLYPS 09/19/2009    COCHRAN,JENNIFER, PTA 01/29/2019, 2:02 PM  [facility] [address] [place], [place], [address] Phone: [phone]   Fax:  [phone]  Name: Gloria Anderson MRN: [id number] Date of Birth: 19-Jun-1933

## 2019-01-31 ENCOUNTER — Ambulatory Visit: Payer: Medicare Other | Attending: Neurosurgery | Admitting: Physical Therapy

## 2019-01-31 ENCOUNTER — Other Ambulatory Visit: Payer: Self-pay

## 2019-01-31 DIAGNOSIS — R293 Abnormal posture: Secondary | ICD-10-CM

## 2019-01-31 DIAGNOSIS — M6281 Muscle weakness (generalized): Secondary | ICD-10-CM

## 2019-01-31 DIAGNOSIS — M545 Low back pain, unspecified: Secondary | ICD-10-CM

## 2019-01-31 DIAGNOSIS — G8929 Other chronic pain: Secondary | ICD-10-CM

## 2019-01-31 NOTE — Therapy (Signed)
[facility] [address] [place], [place], [address] Phone: [phone]   Fax:  [phone]  Physical Therapy Treatment  Patient Details  Name: Gloria Anderson MRN: [id number] Date of Birth: 1933/07/26 Referring Provider (PT): Erline Levine, MD   Encounter Date: 01/31/2019  PT End of Session - 01/31/19 1419    Visit Number  4    Date for PT Re-Evaluation  03/13/19    PT Start Time  1401    PT Stop Time  1440    PT Time Calculation (min)  39 min    Activity Tolerance  Patient tolerated treatment well       Past Medical History:  Diagnosis Date  . Arthritis   . Complication of anesthesia   . Constipation   . Depression   . GERD (gastroesophageal reflux disease)   . Hypertension   . Hypothyroidism   . Osteoporosis   . Pneumonia 2018  . Polymyalgia ([place])   . Polymyalgia rheumatica ([place])   . PONV (postoperative nausea and vomiting)     Past Surgical History:  Procedure Laterality Date  . COLONOSCOPY W/ POLYPECTOMY    . EYE SURGERY     both cataracts  . LUMBAR LAMINECTOMY/DECOMPRESSION MICRODISCECTOMY N/A 08/15/2018   Procedure: Lumbar three to Lumbar five Decompressive lumbar laminectomy;  Surgeon: Erline Levine, MD;  Location: [place];  Service: Neurosurgery;  Laterality: N/A;  . NASAL SINUS SURGERY    . SHOULDER SURGERY Right 2007   repair  . TONSILLECTOMY    . TRIGGER FINGER RELEASE  03/19/2011   Procedure:  left middle finger RELEASE TRIGGER FINGER/A-1 PULLEY;  Surgeon: Cammie Sickle., MD;  Location: [place];  Service: Orthopedics;  Laterality: Right;  Procedure:  Release Right Long and Ring Trigger Fingers, Release Left Long Trigger Finger, Injection Left Long Proximal Phalangeal Joint  . TRIGGER FINGER RELEASE Right    Ring finger, middle finger  . UPPER GASTROINTESTINAL ENDOSCOPY      There were no vitals filed for this visit.  Subjective Assessment - 01/31/19 1416    Subjective  Pt states she is doing well with the exercises. I feel no pain currently, just some pulling when I sleep    Pertinent History  laminectomy 07/2018, hx of back pain, chronic pain, osteoporosis    Patient Stated Goals  keep from having more pain    Currently in Pain?  No/denies                       [facility] Adult PT Treatment/Exercise - 01/31/19 0001      Lumbar Exercises: Standing   Other Standing Lumbar Exercises  hip series with yellow band - 2x 10      Knee/Hip Exercises: Standing   Forward Step Up  Right;Left;20 reps;Hand Hold: 0;Step Height: 6"   intermittent touching railing   SLS  on level surface, on foam    SLS with Vectors  tandem standing with head turns    Gait Training  tandem walking and [place] - close supervision - 2 laps across carpet    Other Standing Knee Exercises  single leg stand with UE movements               PT Short Term Goals - 01/22/19 1201      PT SHORT TERM GOAL #1   Title  ind with initial HEP    Time  4    Period  Weeks  Status  Achieved    Target Date  02/13/19        PT Long Term Goals - 01/31/19 1445      PT LONG TERM GOAL #1   Title  pt will be ind with HEP for strengthening in order to regain and maintain strength lost since COVID preventing her from attending regular exercise classes.    Status  On-going      PT LONG TERM GOAL #2   Title  Pt will be able to perform single leg stand with UE movements x 10 seconds bilaterally for improved balance    Baseline  was able to do on the Rt LE    Status  Partially Met      PT LONG TERM GOAL #3   Title  pt will demonstrate improved upright posture in standing and sitting throughout the day due to improved core strength and awareness of posture    Status  On-going      PT LONG TERM GOAL #4   Title  Pt will report pain frequency reduced by 75% less pain    Baseline  only has mild pain when turning in bed    Status  On-going            Plan -  01/31/19 1445    Clinical Impression Statement  Pt was able to progress exercises today and began to incorporate balance.  She has better balance on the Rt LE. Pt was given exercises for balance and core strength to add to HEP.  Pt will benefit from one more visit to ensure successful transition to HEP.    PT Treatment/Interventions  ADLs/Self Care Home Management;Cryotherapy;Electrical Stimulation;Moist Heat;Therapeutic exercise;Therapeutic activities;Neuromuscular re-education;Patient/family education;Manual techniques;Passive range of motion;Taping;Dry needling    PT Next Visit Plan  review balance and core strength, goals, FOTO    PT Home Exercise Plan  Access Code: [email or web address]    Consulted and Agree with Plan of Care  Patient       Patient will benefit from skilled therapeutic intervention in order to improve the following deficits and impairments:  Pain, Postural dysfunction, Decreased strength, Impaired flexibility, Increased muscle spasms  Visit Diagnosis: Chronic right-sided low back pain, unspecified whether sciatica present  Muscle weakness (generalized)  Abnormal posture     Problem List Patient Active Problem List   Diagnosis Date Noted  . Adult night terrors 01/09/2019  . Degenerative lumbar spinal stenosis 08/15/2018  . Memory difficulty 07/26/2017  . Other parasomnia 07/26/2017  . Anemia 06/02/2017  . Osteopenia 06/02/2017  . Spinal stenosis 06/02/2017  . Iron deficiency anemia secondary to inadequate dietary iron intake 08/05/2016  . Fatigue associated with anemia 08/05/2016  . Primary osteoarthritis of both feet 02/04/2016  . Essential hypertension 02/04/2016  . PMR (polymyalgia rheumatica) (HCC) 02/03/2016  . Osteoarthritis, hand 02/03/2016  . Age-related osteoporosis without current pathological fracture 02/03/2016  . DDD (degenerative disc disease), lumbar 02/03/2016  . High risk medication use 02/03/2016  . 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 Cerebral cavernous malformation type 1 12/04/2013  . Night terrors, adult 09/26/2013  . HEMORRHOIDS-EXTERNAL 09/19/2009  . GERD 09/19/2009  . CONSTIPATION 09/19/2009  . DYSPHAGIA 09/19/2009  . PERSONAL HISTORY OF COLONIC POLYPS 09/19/2009    Jule Ser, PT 01/31/2019, 2:50 PM  [facility] [address], [place], [place], [address] Phone: [phone]   Fax:  [phone]  Name: Gloria Anderson MRN: [id number] Date  of Birth: 01-Mar-1934

## 2019-02-12 ENCOUNTER — Encounter: Payer: Medicare Other | Admitting: Physical Therapy

## 2019-02-15 ENCOUNTER — Encounter: Payer: Self-pay | Admitting: Physical Therapy

## 2019-02-15 ENCOUNTER — Ambulatory Visit: Payer: Medicare Other | Admitting: Physical Therapy

## 2019-02-15 ENCOUNTER — Other Ambulatory Visit: Payer: Self-pay

## 2019-02-15 DIAGNOSIS — M6281 Muscle weakness (generalized): Secondary | ICD-10-CM

## 2019-02-15 DIAGNOSIS — M545 Low back pain, unspecified: Secondary | ICD-10-CM

## 2019-02-15 DIAGNOSIS — R293 Abnormal posture: Secondary | ICD-10-CM

## 2019-02-15 DIAGNOSIS — G8929 Other chronic pain: Secondary | ICD-10-CM

## 2019-02-16 ENCOUNTER — Telehealth: Payer: Self-pay

## 2019-02-16 NOTE — Telephone Encounter (Signed)
She can increase Coreg 3.125 mg to two tablets for a week or two until BP is stable or if she tolerates this, we can send new Rx

## 2019-02-16 NOTE — Therapy (Signed)
[facility] [address], [place], [place], [address] Phone: [phone]   Fax:  [phone]  Physical Therapy Treatment  Patient Details  Name: Gloria Anderson MRN: [id number] Date of Birth: 1933/07/26 Referring Provider (PT): Erline Levine, MD   Encounter Date: 02/15/2019  PT End of Session - 02/15/19 1402    Visit Number  5    Date for PT Re-Evaluation  03/13/19    PT Start Time  2542    PT Stop Time  1443    PT Time Calculation (min)  40 min    Activity Tolerance  Patient tolerated treatment well    Behavior During Therapy  [facility] for tasks assessed/performed       Past Medical History:  Diagnosis Date  . Arthritis   . Complication of anesthesia   . Constipation   . Depression   . GERD (gastroesophageal reflux disease)   . Hypertension   . Hypothyroidism   . Osteoporosis   . Pneumonia 2018  . Polymyalgia ([place])   . Polymyalgia rheumatica ([place])   . PONV (postoperative nausea and vomiting)     Past Surgical History:  Procedure Laterality Date  . COLONOSCOPY W/ POLYPECTOMY    . EYE SURGERY     both cataracts  . LUMBAR LAMINECTOMY/DECOMPRESSION MICRODISCECTOMY N/A 08/15/2018   Procedure: Lumbar three to Lumbar five Decompressive lumbar laminectomy;  Surgeon: Erline Levine, MD;  Location: [place];  Service: Neurosurgery;  Laterality: N/A;  . NASAL SINUS SURGERY    . SHOULDER SURGERY Right 2007   repair  . TONSILLECTOMY    . TRIGGER FINGER RELEASE  03/19/2011   Procedure:  left middle finger RELEASE TRIGGER FINGER/A-1 PULLEY;  Surgeon: Cammie Sickle., MD;  Location: [place];  Service: Orthopedics;  Laterality: Right;  Procedure:  Release Right Long and Ring Trigger Fingers, Release Left Long Trigger Finger, Injection Left Long Proximal Phalangeal Joint  . TRIGGER FINGER RELEASE Right    Ring finger, middle finger  . UPPER GASTROINTESTINAL ENDOSCOPY      There were no vitals filed  for this visit.  Subjective Assessment - 02/15/19 1532    Subjective  I want to go over all the exercises and make sure I am doing them correctly.  I haven't had any increase of pain with anything, but I still have a very small pulling feeling in the Rt low back at times. pt states she feels good with doing her current HEP and is ready to discharge.    Patient Stated Goals  keep from having more pain    Currently in Pain?  No/denies                       [facility] Adult PT Treatment/Exercise - 02/16/19 0001      Neuro Re-ed    Neuro Re-ed Details   balance with head turns, UE flex and scap 2lb with SLS; standing exercises with min UE support      Lumbar Exercises: Stretches   Active Hamstring Stretch  Right;Left;2 reps;20 seconds    Figure 4 Stretch  2 reps;30 seconds      Lumbar Exercises: Standing   Row  Strengthening;Left;10 reps;Theraband    Theraband Level (Row)  Level 2 (Red)    Shoulder Extension  Strengthening;Both;10 reps;Theraband    Theraband Level (Shoulder Extension)  Level 2 (Red)    Other Standing Lumbar Exercises  hip series with yellow band - 2x 10  Lumbar Exercises: Supine   Bent Knee Raise  15 reps    Bent Knee Raise Limitations  TC at pelvis for stabiliization    Bridge  10 reps;3 seconds      Knee/Hip Exercises: Standing   SLS with Vectors  tandem standing with head turns    Other Standing Knee Exercises  single leg stand with UE movements               PT Short Term Goals - 01/22/19 1201      PT SHORT TERM GOAL #1   Title  ind with initial HEP    Time  4    Period  Weeks    Status  Achieved    Target Date  02/13/19        PT Long Term Goals - 02/15/19 1534      PT LONG TERM GOAL #1   Title  pt will be ind with HEP for strengthening in order to regain and maintain strength lost since COVID preventing her from attending regular exercise classes.    Status  Achieved      PT LONG TERM GOAL #2   Title  Pt will be able to  perform single leg stand with UE movements x 10 seconds bilaterally for improved balance    Baseline  able to perform single leg stand with UE movements    Status  Achieved      PT LONG TERM GOAL #3   Title  pt will demonstrate improved upright posture in standing and sitting throughout the day due to improved core strength and awareness of posture    Baseline  pt has been aware of her posture    Status  Achieved      PT LONG TERM GOAL #4   Title  Pt will report pain frequency reduced by 75% less pain    Baseline  states she has no pain when turning in bed, could not say how much less pain overall, but was not more- she wasn't having a lot to            Plan - 02/16/19 0745    Clinical Impression Statement  Pt is doing well with her HEP and has not had any increased pain.  She has met her long term goals as stated and will discharge from skilled PT. Overall, she is very pleased with the outcome of her surgery and she feels that the HEP is helping her regain some strength she lost since the gyms and pool has been closed.    PT Treatment/Interventions  ADLs/Self Care Home Management;Cryotherapy;Electrical Stimulation;Moist Heat;Therapeutic exercise;Therapeutic activities;Neuromuscular re-education;Patient/family education;Manual techniques;Passive range of motion;Taping;Dry needling    PT Next Visit Plan  d/c today    Consulted and Agree with Plan of Care  Patient       Patient will benefit from skilled therapeutic intervention in order to improve the following deficits and impairments:  Pain, Postural dysfunction, Decreased strength, Impaired flexibility, Increased muscle spasms  Visit Diagnosis: Chronic right-sided low back pain, unspecified whether sciatica present  Muscle weakness (generalized)  Abnormal posture     Problem List Patient Active Problem List   Diagnosis Date Noted  . Adult night terrors 01/09/2019  . Degenerative lumbar spinal stenosis 08/15/2018  .  Memory difficulty 07/26/2017  . Other parasomnia 07/26/2017  . Anemia 06/02/2017  . Osteopenia 06/02/2017  . Spinal stenosis 06/02/2017  . Iron deficiency anemia secondary to inadequate dietary iron intake 08/05/2016  . Fatigue associated  with anemia 08/05/2016  . Primary osteoarthritis of both feet 02/04/2016  . Essential hypertension 02/04/2016  . PMR (polymyalgia rheumatica) (HCC) 02/03/2016  . Osteoarthritis, hand 02/03/2016  . Age-related osteoporosis without current pathological fracture 02/03/2016  . DDD (degenerative disc disease), lumbar 02/03/2016  . High risk medication use 02/03/2016  . 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 Cerebral cavernous malformation type 1 12/04/2013  . Night terrors, adult 09/26/2013  . HEMORRHOIDS-EXTERNAL 09/19/2009  . GERD 09/19/2009  . CONSTIPATION 09/19/2009  . DYSPHAGIA 09/19/2009  . PERSONAL HISTORY OF COLONIC POLYPS 09/19/2009    Jule Ser, PT 02/16/2019, 7:55 AM  [facility] [address] [place], [place], [address] Phone: [phone]   Fax:  [phone]  Name: Gloria Anderson MRN: [id number] Date of Birth: 1934-01-13

## 2019-02-16 NOTE — Telephone Encounter (Signed)
Pt c/o being dizzy this morning; Her BP is high and has been,  she has a diary; past few days of 157/80, 144/70, 151/82; she is concerned and thinks her meds need to be adjusted; she does take her hydralazine and it drops a little in like a hour or two; the dizziness is new; Please advise

## 2019-02-19 ENCOUNTER — Other Ambulatory Visit: Payer: Self-pay

## 2019-02-19 ENCOUNTER — Telehealth: Payer: Self-pay | Admitting: Cardiology

## 2019-02-19 ENCOUNTER — Other Ambulatory Visit: Payer: Self-pay | Admitting: Cardiology

## 2019-02-19 DIAGNOSIS — I10 Essential (primary) hypertension: Secondary | ICD-10-CM

## 2019-02-19 MED ORDER — CARVEDILOL 3.125 MG PO TABS: 3.1250 mg | ORAL_TABLET | Freq: Four times a day (QID) | ORAL | 1 refills | Status: DC

## 2019-02-19 NOTE — Telephone Encounter (Signed)
Pt is aware  and will call in new rx

## 2019-02-19 NOTE — Telephone Encounter (Signed)
Patient called on 02/18/2019, concerned about her BP running 160/90s. She does not have any particular symptoms. She is not taking prn hydralazine, as recommended by Dr. Einar Gip at last visit.  I asked her to increase carvedilol to 3.125 mg 2 tab bid, that's 6.25 mg bid.  She also has had a possible COVID exposure in last 4 days. No symptoms reported. Recommend 14 day quarantine.  Will arrange telemedicine f/u in 1-2 weeks with Dr. Einar Gip or Jeri Lager, [place].

## 2019-02-27 ENCOUNTER — Other Ambulatory Visit: Payer: Self-pay

## 2019-02-27 ENCOUNTER — Encounter: Payer: Self-pay | Admitting: Cardiology

## 2019-02-27 ENCOUNTER — Telehealth (INDEPENDENT_AMBULATORY_CARE_PROVIDER_SITE_OTHER): Payer: Self-pay | Admitting: Cardiology

## 2019-02-27 VITALS — BP 162/85 | HR 86 | Temp 97.7°F | Ht 64.0 in | Wt 120.0 lb

## 2019-02-27 DIAGNOSIS — I951 Orthostatic hypotension: Secondary | ICD-10-CM

## 2019-02-27 DIAGNOSIS — I10 Essential (primary) hypertension: Secondary | ICD-10-CM

## 2019-02-27 NOTE — Progress Notes (Signed)
Primary Physician/Referring:  [name], MD  Patient ID: Gloria Anderson, female    DOB: 1933/11/04, 85 y.o.   MRN: [id number]  Chief Complaint  Patient presents with  . Hypertension   This visit type was conducted due to national recommendations for restrictions regarding the COVID-19 Pandemic (e.g. social distancing).  This format is felt to be most appropriate for this patient at this time.  All issues noted in this document were discussed and addressed.  No physical exam was performed (except for noted visual exam findings with Telehealth visits).  The patient has consented to conduct a Telehealth visit and understands insurance will be billed.   I discussed the limitations of evaluation and management by telemedicine and the availability of in person appointments. The patient expressed understanding and agreed to proceed.  Virtual Visit via Video Note is as below  I connected with@, on 02/27/19 at 08504 by telephone and verified that I am speaking with the correct person using two identifiers. Unable to perform video visit as patient did not have equipment.    I have discussed with the patient regarding the safety during COVID Pandemic and steps and precautions including social distancing with the patient.    HPI: Gloria Anderson  is a 85 y.o. female  with essential hypertension and orthostatic hypotension, mild hyperlipidemia, moderate MR and TR, moderate pulmonary hypertension presents here for follow-up of hypertension.   She was last seen virtually in October, echocardiogram at that time revealed stable valvular heart disease. Recently called concerned about elevated blood pressures. Coreg was increased to 6.125 mg BID.   Since being on increased dose of Coreg, her blood pressures have improved. She reports that systolic readings have generally been in the 130's. She has had a couple of episodes where her standing systolic readings were in the 90's. She has not noticed  any significant dizziness since being compliant with support stockings. She does sleep on a wedge at night.   Past Medical History:  Diagnosis Date  . Arthritis   . Complication of anesthesia   . Constipation   . Depression   . GERD (gastroesophageal reflux disease)   . Hypertension   . Hypothyroidism   . Osteoporosis   . Pneumonia 2018  . Polymyalgia ([place])   . Polymyalgia rheumatica ([place])   . PONV (postoperative nausea and vomiting)     Past Surgical History:  Procedure Laterality Date  . COLONOSCOPY W/ POLYPECTOMY    . EYE SURGERY     both cataracts  . LUMBAR LAMINECTOMY/DECOMPRESSION MICRODISCECTOMY N/A 08/15/2018   Procedure: Lumbar three to Lumbar five Decompressive lumbar laminectomy;  Surgeon: Erline Levine, MD;  Location: [facility];  Service: Neurosurgery;  Laterality: N/A;  . NASAL SINUS SURGERY    . SHOULDER SURGERY Right 2007   repair  . TONSILLECTOMY    . TRIGGER FINGER RELEASE  03/19/2011   Procedure:  left middle finger RELEASE TRIGGER FINGER/A-1 PULLEY;  Surgeon: Cammie Sickle., MD;  Location: [facility];  Service: Orthopedics;  Laterality: Right;  Procedure:  Release Right Long and Ring Trigger Fingers, Release Left Long Trigger Finger, Injection Left Long Proximal Phalangeal Joint  . TRIGGER FINGER RELEASE Right    Ring finger, middle finger  . UPPER GASTROINTESTINAL ENDOSCOPY      Social History   Socioeconomic History  . Marital status: Widowed    Spouse name: Not on file  . Number of children: 2  . Years of education: HS  . Highest education  level: Not on file  Occupational History  . Occupation: RETIRED    Employer: [employer]  Tobacco Use  . Smoking status: Never Smoker  . Smokeless tobacco: Never Used  Substance and Sexual Activity  . Alcohol use: No    Alcohol/week: 0.0 standard drinks  . Drug use: Never  . Sexual activity: Not on file  Other Topics Concern  . Not on file  Social History Narrative   Patient is widowed.     Patient has two children.   Patient does not drink any caffeine.    Patient has a high school education.   Patient is right-handed.            Social Determinants of Health   Financial Resource Strain:   . Difficulty of Paying Living Expenses: Not on file  Food Insecurity:   . Worried About Charity fundraiser in the Last Year: Not on file  . Ran Out of Food in the Last Year: Not on file  Transportation Needs:   . Lack of Transportation (Medical): Not on file  . Lack of Transportation (Non-Medical): Not on file  Physical Activity:   . Days of Exercise per Week: Not on file  . Minutes of Exercise per Session: Not on file  Stress:   . Feeling of Stress : Not on file  Social Connections:   . Frequency of Communication with Friends and Family: Not on file  . Frequency of Social Gatherings with Friends and Family: Not on file  . Attends Religious Services: Not on file  . Active Member of Clubs or Organizations: Not on file  . Attends Archivist Meetings: Not on file  . Marital Status: Not on file  Intimate Partner Violence:   . Fear of Current or Ex-Partner: Not on file  . Emotionally Abused: Not on file  . Physically Abused: Not on file  . Sexually Abused: Not on file    Current Outpatient Medications on File Prior to Visit  Medication Sig Dispense Refill  . atorvastatin (LIPITOR) 10 MG tablet Take 10 mg by mouth daily.     Marland Kitchen BIOTIN FORTE PO Take 1 tablet by mouth daily.     . Calcium Citrate (CITRACAL PO) Take 1,200 mg by mouth daily.     . Carboxymethylcell-Hypromellose (GENTEAL OP) Place 1 drop into both eyes 2 (two) times daily as needed.     . carvedilol (COREG) 3.125 MG tablet TAKE 1 TABLET(3.125 MG) BY MOUTH FOUR TIMES DAILY 360 tablet 3  . clonazePAM (KLONOPIN) 0.5 MG tablet Take half tablet at  as needed (0.25 MG) and 2 at bedtime. 90 tablet 5  . denosumab (PROLIA) 60 MG/ML SOSY injection Inject 60 mg into the skin every 6 (six) months.    Mariane Baumgarten  Calcium (STOOL SOFTENER PO) Take 100 mg by mouth daily.     . fish oil-omega-3 fatty acids 1000 MG capsule Take 1 g by mouth daily.     . Garlic 6808 MG TBEC Take 2,000 mg by mouth daily.     . hydrALAZINE (APRESOLINE) 25 MG tablet Take 1 tablet (25 mg total) by mouth 3 (three) times daily as needed (blood pressure >150). 270 tablet 3  . hydroxychloroquine (PLAQUENIL) 200 MG tablet TAKE 1 TABLET BY MOUTH  DAILY 90 tablet 0  . Iron-Vitamins (GERITOL PO) Take 1 tablet by mouth daily.     Marland Kitchen levothyroxine (SYNTHROID, LEVOTHROID) 50 MCG tablet Take 50 mcg by mouth daily before breakfast.     .  Lifitegrast (XIIDRA OP) Apply 1 drop to eye 2 (two) times daily.    Marland Kitchen omeprazole (PRILOSEC) 20 MG capsule Take 20 mg by mouth daily.    . polyethylene glycol (MIRALAX / GLYCOLAX) packet Take 17 g by mouth daily as needed for moderate constipation.     . Probiotic Product (PROBIOTIC DAILY PO) Take 1 tablet by mouth daily.     No current facility-administered medications on file prior to visit.    Review of Systems  Constitution: Negative for chills, decreased appetite, malaise/fatigue and weight gain.  Cardiovascular: Negative for dyspnea on exertion, leg swelling, near-syncope and syncope.  Respiratory: Positive for wheezing.   Endocrine: Negative for cold intolerance.  Hematologic/Lymphatic: Does not bruise/bleed easily.  Musculoskeletal: Positive for back pain, muscle cramps and myalgias. Negative for joint swelling.  Gastrointestinal: Negative for abdominal pain, anorexia and change in bowel habit.  Neurological: Positive for dizziness (occasional). Negative for headaches and light-headedness.  Psychiatric/Behavioral: Negative for depression and substance abuse.  All other systems reviewed and are negative.     Objective:  Blood pressure (!) 162/85, pulse 86, temperature 97.7 F (36.5 C), temperature source Tympanic, height '5\' 4"'  (1.626 m), weight 120 lb (54.4 kg). Body mass index is 20.6 kg/m.    Standing BP: 144/83   Physical exam not performed due to telephone encounter only. Prior visit information is as below.   Physical Exam  Constitutional: She appears well-developed and well-nourished. No distress.  HENT:  Head: Atraumatic.  Eyes: Conjunctivae are normal.  Neck: No JVD present. No thyromegaly present.  Cardiovascular: Normal rate, regular rhythm and intact distal pulses. Exam reveals no gallop.  Murmur heard. High-pitched blowing midsystolic murmur is present with a grade of 2/6 at the apex. Pulses:      Carotid pulses are on the right side with bruit and on the left side with bruit.      Femoral pulses are on the right side with bruit and on the left side with bruit. Pulmonary/Chest: Effort normal and breath sounds normal.  Abdominal: Soft. Bowel sounds are normal. She exhibits abdominal bruit.  Musculoskeletal:        General: No edema. Normal range of motion.     Cervical back: Neck supple.  Neurological: She is alert.  Skin: Skin is warm and dry.  Psychiatric: She has a normal mood and affect.   Radiology: No results found. Laboratory Examination:   03/22/2017: Creatinine 1.1, EGFR 47/57, potassium 4.4, CMP normal.  RBC 3.7, normal hemoglobin, hematocrit 35.8, MCV 97, MCH 33, RDW 11.7%.  Cholesterol 268, triglycerides 58, HDL 94, LDL 162.  TSH 1.9.  CMP Latest Ref Rng & Units 08/11/2018 03/23/2018 09/20/2017  Glucose 70 - 99 mg/dL 110(H) 92 101(H)  BUN 8 - 23 mg/dL '22 21 23  ' Creatinine 0.44 - 1.00 mg/dL 1.26(H) 1.02(H) 1.13(H)  Sodium 135 - 145 mmol/L 140 140 139  Potassium 3.5 - 5.1 mmol/L 4.6 4.5 4.4  Chloride 98 - 111 mmol/L 105 101 103  CO2 22 - 32 mmol/L '25 30 26  ' Calcium 8.9 - 10.3 mg/dL 9.5 9.9 9.4  Total Protein 6.1 - 8.1 g/dL - 6.8 6.9  Total Bilirubin 0.2 - 1.2 mg/dL - 0.9 0.9  AST 10 - 35 U/L - 34 26  ALT 6 - 29 U/L - 28 16   CBC Latest Ref Rng & Units 08/11/2018 03/23/2018 09/20/2017  WBC 4.0 - 10.5 K/uL 5.3 5.2 5.8  Hemoglobin 12.0 - 15.0  g/dL 11.6(L) 11.4(L) 11.6(L)  Hematocrit  36.0 - 46.0 % 34.6(L) 34.0(L) 33.4(L)  Platelets 150 - 400 K/uL 183 182 201   Lipid Panel  No results found for: CHOL, TRIG, HDL, CHOLHDL, VLDL, LDLCALC, LDLDIRECT HEMOGLOBIN A1C No results found for: HGBA1C, MPG TSH No results for input(s): TSH in the last 8760 hours.  Cardiac studies:   Exercise Treadmill Stress Test 10/07/2017:  Indication: chest pain The patient exercised on Bruce protocol for  06:19 min. Patient achieved  7.40 METS and reached HR  114 bpm, which is  82 % of maximum age-predicted HR.  Stress test terminated due to fatigue.   Exercise capacity was fair for age. HR Response to Exercise: Appropriate. BP Response to Exercise: Normal resting BP- appropriate response. Chest Pain: none. Arrhythmias: Occasional PVC s. Resting EKG demonstrates Normal sinus rhythm. ST Changes: With peak exercise there was no ST-T changes of ischemia.  Overall Impression:  Submaximal stress test with no ischemic changes. Continue primary/secondary prevention.  Echocardiogram 12/13/2018: Normal LV systolic function with EF 67%. Left ventricle  cavity is normal in size. Normal left ventricular wall  thickness. Normal global wall motion. Diastolic function  could not be assessed due to severity of mitral  regurgitation.  Moderate (Grade II) mitral regurgitation. Moderate tricuspid regurgitation. Moderate pulmonary  hypertension. Estimated pulmonary artery systolic pressure is  50 mmHg.  Small circumferential pericardial effusion wth no hemodynamic  compromise.  IVC is dilated with a respiratory response of <50%. Estimated  RA pressure 10-15 mmHg. No significant change compared to previous study on  05/09/2018.   Assessment:      ICD-10-CM   1. Essential hypertension  I10   2. Orthostatic hypotension  I95.1     EKG 05/12/2017: Normal sinus rhythm at rate of 66 bpm, borderline criteria for left abnormality, otherwise normal EKG.    Recommendations:    Since being on increased dose of Coreg, her blood pressure has overall been stable.  She does have a history of orthostatic hypotension but symptomatically is also stable.  No syncope or dizzy.  Encouraged her to continue with adequate hydration, support stockings, and sleeping on wedge.  I discussed that with orthostatic hypotension, we have to allow supine hypertension in order to not drop standing systolic readings.  I would recommend treating her standing BP and have encouraged her to starting her blood pressure standing.  Her blood pressure by orthostatics at home have overall been stable.  She can also use hydralazine for systolic blood pressure greater than 160 standing.  She is overall doing well, we will see her back as previously scheduled in April, but encouraged her to contact us sooner if needed.  Miquel Dunn, MSN, APRN, FNP-C [facility]. [place] Office: [phone] Fax: [phone]

## 2019-03-08 ENCOUNTER — Telehealth: Payer: Self-pay | Admitting: Rheumatology

## 2019-03-08 DIAGNOSIS — Z79899 Other long term (current) drug therapy: Secondary | ICD-10-CM

## 2019-03-08 DIAGNOSIS — M353 Polymyalgia rheumatica: Secondary | ICD-10-CM

## 2019-03-08 NOTE — Telephone Encounter (Signed)
Advised patient to get CBC, CMP and ESR, patient verbalized understanding and will go to Dr. Baldwin Crown office. Orders have been released and faxed. Patient will call to schedule lab appointment.

## 2019-03-08 NOTE — Telephone Encounter (Signed)
Patient states she is tender near the temples and it has been "coming and going" and has been experiencing this for a while and it does not happen often. Patient denies headaches. Patient mentioned this to her neurosurgeon and he advised patient to mention this to you at her next visit.

## 2019-03-08 NOTE — Telephone Encounter (Signed)
Patient called stating there are two spots on her head next to her temples that are tender when she washes her hair.  Patient states "the spots are not always tender, just every once in a while and is wondering if it could be temporal arteritis."

## 2019-03-08 NOTE — Telephone Encounter (Signed)
Please advise patient to get CBC, CMP and ESR.  Patient can get it at [facility] lab or at Dr. Baldwin Anderson office.

## 2019-03-12 ENCOUNTER — Encounter: Payer: Self-pay | Admitting: Rheumatology

## 2019-03-12 DIAGNOSIS — M5416 Radiculopathy, lumbar region: Secondary | ICD-10-CM | POA: Diagnosis not present

## 2019-03-12 DIAGNOSIS — M48061 Spinal stenosis, lumbar region without neurogenic claudication: Secondary | ICD-10-CM | POA: Diagnosis not present

## 2019-03-12 DIAGNOSIS — M545 Low back pain: Secondary | ICD-10-CM | POA: Diagnosis not present

## 2019-03-13 ENCOUNTER — Ambulatory Visit: Payer: Medicare Other | Admitting: Rheumatology

## 2019-03-14 ENCOUNTER — Telehealth: Payer: Self-pay | Admitting: Rheumatology

## 2019-03-14 NOTE — Telephone Encounter (Signed)
Lattie Haw from [facility] called stating patient came in the office on Monday, 03/12/19 to have labwork that Dr. Estanislado Pandy requested.  Lattie Haw states they didn't receive any orders and used previous orders that Dr. Estanislado Pandy has sent in the past.  Lattie Haw states she is not sure if the labs that Dr. Estanislado Pandy needs is on the past labwork orders.  Lattie Haw states once they receive the results they will fax a copy to Dr. Estanislado Pandy.  If you have any questions, please call [phone]

## 2019-03-14 NOTE — Telephone Encounter (Signed)
Attempted to contact Lattie Haw at Dr. Baldwin Crown office and left message on machine to advise her we need a CBC, CMP and ESR. Advised her to call the office with which labs were drawn.

## 2019-03-15 ENCOUNTER — Other Ambulatory Visit: Payer: Self-pay

## 2019-03-15 ENCOUNTER — Encounter: Payer: Self-pay | Admitting: Rheumatology

## 2019-03-15 ENCOUNTER — Ambulatory Visit: Payer: Medicare HMO | Admitting: Rheumatology

## 2019-03-15 VITALS — BP 104/62 | HR 79 | Resp 11 | Ht 64.0 in | Wt 119.8 lb

## 2019-03-15 DIAGNOSIS — M25512 Pain in left shoulder: Secondary | ICD-10-CM

## 2019-03-15 DIAGNOSIS — M19071 Primary osteoarthritis, right ankle and foot: Secondary | ICD-10-CM

## 2019-03-15 DIAGNOSIS — M65342 Trigger finger, left ring finger: Secondary | ICD-10-CM | POA: Diagnosis not present

## 2019-03-15 DIAGNOSIS — M51369 Other intervertebral disc degeneration, lumbar region without mention of lumbar back pain or lower extremity pain: Secondary | ICD-10-CM

## 2019-03-15 DIAGNOSIS — M19072 Primary osteoarthritis, left ankle and foot: Secondary | ICD-10-CM

## 2019-03-15 DIAGNOSIS — Z79899 Other long term (current) drug therapy: Secondary | ICD-10-CM

## 2019-03-15 DIAGNOSIS — M19041 Primary osteoarthritis, right hand: Secondary | ICD-10-CM

## 2019-03-15 DIAGNOSIS — M810 Age-related osteoporosis without current pathological fracture: Secondary | ICD-10-CM

## 2019-03-15 DIAGNOSIS — G8929 Other chronic pain: Secondary | ICD-10-CM

## 2019-03-15 DIAGNOSIS — M19042 Primary osteoarthritis, left hand: Secondary | ICD-10-CM

## 2019-03-15 DIAGNOSIS — M5030 Other cervical disc degeneration, unspecified cervical region: Secondary | ICD-10-CM

## 2019-03-15 DIAGNOSIS — Z8679 Personal history of other diseases of the circulatory system: Secondary | ICD-10-CM

## 2019-03-15 DIAGNOSIS — M353 Polymyalgia rheumatica: Secondary | ICD-10-CM | POA: Diagnosis not present

## 2019-03-15 DIAGNOSIS — Z8719 Personal history of other diseases of the digestive system: Secondary | ICD-10-CM

## 2019-03-15 DIAGNOSIS — M5136 Other intervertebral disc degeneration, lumbar region: Secondary | ICD-10-CM | POA: Diagnosis not present

## 2019-03-15 DIAGNOSIS — Z862 Personal history of diseases of the blood and blood-forming organs and certain disorders involving the immune mechanism: Secondary | ICD-10-CM

## 2019-03-15 DIAGNOSIS — R700 Elevated erythrocyte sedimentation rate: Secondary | ICD-10-CM

## 2019-03-15 DIAGNOSIS — Z9889 Other specified postprocedural states: Secondary | ICD-10-CM

## 2019-03-15 NOTE — Telephone Encounter (Signed)
Received lab results via fax. Patient is scheduled for an appointment on 03/15/2019 for an evaluation and to discuss with Dr. Estanislado Pandy.

## 2019-03-15 NOTE — Progress Notes (Signed)
Office Visit Note  Patient: Gloria Anderson             Date of Birth: 1933/11/02           MRN: [id number]             PCP: [name], MD Referring: [name], MD Visit Date: 03/15/2019 Occupation: '@GUAROCC'$ @  Subjective:  No chief complaint on file.   History of Present Illness: Gloria Anderson is a 86 y.o. female with history of polymyalgia rheumatica, degenerative disc disease and osteoarthritis.  According to patient she went to see Dr. Vertell Limber in May 2020 for surgery.  At the time she mentioned that she occasionally gets some tenderness in the right temporal region.  She states she was not having headaches but if she will touch that area it would be tender.  She was told to come in for evaluation but with the COVID-19 she could not come for an appointment.  She had cervical spine surgery and June 2020 and has done quite well after that.  She states the tenderness in that region comes and goes and mostly she notices if she touches the area.  She has not had any headaches.  She recalled about this discussion and contacted our office about 1 week ago and mentioned the sensitivity in the temporal region.  I ordered a sedimentation rate and the results came with her sed rate elevated at 75.  She denies any muscle weakness or tenderness.  She states she has some discomfort in the left shoulder off and on.  Activities of Daily Living:  Patient reports morning stiffness for 0 minutes.   Patient Denies nocturnal pain.  Difficulty dressing/grooming: Denies Difficulty climbing stairs: Denies Difficulty getting out of chair: Reports(lower back arthritis) Difficulty using hands for taps, buttons, cutlery, and/or writing: Reports  Review of Systems  Constitutional: Negative for fatigue.  HENT: Positive for mouth dryness. Negative for mouth sores and nose dryness.   Eyes: Positive for dryness. Negative for itching.  Respiratory: Negative for shortness of breath, wheezing and  difficulty breathing.   Cardiovascular: Negative for chest pain and palpitations.  Gastrointestinal: Positive for constipation. Negative for blood in stool and diarrhea.  Endocrine: Negative for increased urination.  Genitourinary: Negative for difficulty urinating and painful urination.  Musculoskeletal: Positive for arthralgias and joint pain. Negative for gait problem, joint swelling and morning stiffness.  Skin: Negative for rash and hair loss.  Allergic/Immunologic: Negative for susceptible to infections.  Neurological: Negative for headaches, memory loss and weakness.  Hematological: Negative for bruising/bleeding tendency.  Psychiatric/Behavioral: Negative for confusion and sleep disturbance. The patient is not nervous/anxious.     PMFS History:  Patient Active Problem List   Diagnosis Date Noted  . Adult night terrors 01/09/2019  . Degenerative lumbar spinal stenosis 08/15/2018  . Memory difficulty 07/26/2017  . Other parasomnia 07/26/2017  . Anemia 06/02/2017  . Osteopenia 06/02/2017  . Spinal stenosis 06/02/2017  . Iron deficiency anemia secondary to inadequate dietary iron intake 08/05/2016  . Fatigue associated with anemia 08/05/2016  . Primary osteoarthritis of both feet 02/04/2016  . Essential hypertension 02/04/2016  . PMR (polymyalgia rheumatica) (HCC) 02/03/2016  . Osteoarthritis, hand 02/03/2016  . Age-related osteoporosis without current pathological fracture 02/03/2016  . DDD (degenerative disc disease), lumbar 02/03/2016  . High risk medication use 02/03/2016  . 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 Cerebral cavernous malformation type 1 12/04/2013  . Night terrors, adult 09/26/2013  .  HEMORRHOIDS-EXTERNAL 09/19/2009  . GERD 09/19/2009  . CONSTIPATION 09/19/2009  . DYSPHAGIA 09/19/2009  . PERSONAL HISTORY OF COLONIC POLYPS 09/19/2009    Past Medical History:  Diagnosis Date  . Arthritis   . Complication of  anesthesia   . Constipation   . Depression   . GERD (gastroesophageal reflux disease)   . Hypertension   . Hypothyroidism   . Osteoporosis   . Pneumonia 2018  . Polymyalgia ([place])   . Polymyalgia rheumatica ([place])   . PONV (postoperative nausea and vomiting)     Family History  Problem Relation Age of Onset  . CVA Father   . Tuberculosis Mother   . Lung cancer Brother    Past Surgical History:  Procedure Laterality Date  . COLONOSCOPY W/ POLYPECTOMY    . EYE SURGERY     both cataracts  . LUMBAR LAMINECTOMY/DECOMPRESSION MICRODISCECTOMY N/A 08/15/2018   Procedure: Lumbar three to Lumbar five Decompressive lumbar laminectomy;  Surgeon: Erline Levine, MD;  Location: [facility];  Service: Neurosurgery;  Laterality: N/A;  . NASAL SINUS SURGERY    . SHOULDER SURGERY Right 2007   repair  . TONSILLECTOMY    . TRIGGER FINGER RELEASE  03/19/2011   Procedure:  left middle finger RELEASE TRIGGER FINGER/A-1 PULLEY;  Surgeon: Cammie Sickle., MD;  Location: [facility];  Service: Orthopedics;  Laterality: Right;  Procedure:  Release Right Long and Ring Trigger Fingers, Release Left Long Trigger Finger, Injection Left Long Proximal Phalangeal Joint  . TRIGGER FINGER RELEASE Right    Ring finger, middle finger  . UPPER GASTROINTESTINAL ENDOSCOPY     Social History   Social History Narrative   Patient is widowed.   Patient has two children.   Patient does not drink any caffeine.    Patient has a high school education.   Patient is right-handed.            Immunization History  Administered Date(s) Administered  . Influenza, High Dose Seasonal PF 12/19/2016  . Zoster Recombinat (Shingrix) 08/01/2017, 10/10/2017     Objective: Vital Signs: BP 104/62 (BP Location: Left Arm, Patient Position: Standing, Cuff Size: Normal)   Pulse 79   Resp 11   Ht _0  (1.626 m)   Wt 119 lb 12.8 oz (54.3 kg)   BMI 20.56 kg/m    Physical Exam Vitals and nursing note reviewed.    Constitutional:      Appearance: She is well-developed.  HENT:     Head: Normocephalic and atraumatic.  Eyes:     Conjunctiva/sclera: Conjunctivae normal.  Cardiovascular:     Rate and Rhythm: Normal rate and regular rhythm.     Heart sounds: Normal heart sounds.  Pulmonary:     Effort: Pulmonary effort is normal.     Breath sounds: Normal breath sounds.  Abdominal:     General: Bowel sounds are normal.     Palpations: Abdomen is soft.  Musculoskeletal:     Cervical back: Normal range of motion.     Comments: Patient had no muscular weakness or tenderness.  Lymphadenopathy:     Cervical: No cervical adenopathy.  Skin:    General: Skin is warm and dry.     Capillary Refill: Capillary refill takes less than 2 seconds.  Neurological:     Mental Status: She is alert and oriented to person, place, and time.     Comments: She had no temporal artery tenderness.  Psychiatric:        Behavior:  Behavior normal.      Musculoskeletal Exam: C-spine thoracic and lumbar spine were in good range of motion.  Shoulder joints, elbow joints, wrist joints, MCPs PIPs and DIPs with good range of motion with no synovitis.  She has some DIP and PIP thickening in her hands consistent with osteoarthritis.  Hip joints, knee joints, ankles with good range of motion with no synovitis.  She had no difficulty getting up from the chair without using the arm rest.  CDAI Exam: CDAI Score: -- Patient Global: --; Provider Global: -- Swollen: --; Tender: -- Joint Exam 03/15/2019   No joint exam has been documented for this visit   There is currently no information documented on the homunculus. Go to the Rheumatology activity and complete the homunculus joint exam.  Investigation: No additional findings.  Imaging: No results found.  Recent Labs: Lab Results  Component Value Date   WBC 5.3 08/11/2018   HGB 11.6 (L) 08/11/2018   PLT 183 08/11/2018   NA 140 08/11/2018   K 4.6 08/11/2018   CL 105  08/11/2018   CO2 25 08/11/2018   GLUCOSE 110 (H) 08/11/2018   BUN 22 08/11/2018   CREATININE 1.26 (H) 08/11/2018   BILITOT 0.9 03/23/2018   AST 34 03/23/2018   ALT 28 03/23/2018   PROT 6.8 03/23/2018   CALCIUM 9.5 08/11/2018   GFRAA 46 (L) 08/11/2018   March 12, 2019 CBC showed hemoglobin 11.8, BMP normal, TSH normal T4 normal, vitamin D 52, ESR 75   speciality Comments: PLQ eye exam: 10/16/2018 normal. Dr. Gershon Crane. Follow up in 1 year.  Procedures:  No procedures performed Allergies: Patient has no known allergies.   Assessment / Plan:     Visit Diagnoses: PMR (polymyalgia rheumatica) ([place]) -patient has no muscular weakness or tenderness on examination.   Elevated sed rate -ESR 75 on 03/14/19.  Her baseline sed rate is in the 50s and 60s.  Patient states that she she has some discomfort when she puts the glasses on behind her right ear.  She states the symptoms have been going on since May 2020.  The symptoms come and go and not has been persistent.  She denies any history of headaches.  At this point she is asymptomatic and I do not think that she has temporal arteritis.  She had no tenderness over temporal region.  There is no history of headaches.  I advised her to call me in case she develops temporal artery tenderness or headaches.  I also advised her to repeat sed rate and the next few weeks which she can do with Dr. Baldwin Crown office.  High risk medication use - PLQ 200 mg p.o. daily.  She has been tolerating her medications well.  Her labs have been stable.  Her eye exam has been up-to-date.  Chronic left shoulder pain-she has off-and-on discomfort in her left shoulder joint.  She had no warmth swelling or effusion on examination today and the shoulder joint was in full range of motion.  Primary osteoarthritis of both hands-joint protection was discussed.  Trigger ring finger of left hand-at this point she does not want injection.  Primary osteoarthritis of both feet-she uses  proper fitting shoes which has been helpful.  History of repair of right rotator cuff-doing well.  DDD (degenerative disc disease), cervical-she had surgery by Dr. Vertell Limber in June 2020 and had good results.  DDD (degenerative disc disease), lumbar-she currently does not have any lumbar discomfort.  She states she has been doing  exercises on a routine basis.  Age-related osteoporosis without current pathological fracture-her last bone density in February 2020 was better.  She is currently on drug holiday.  History of gastroesophageal reflux (GERD)  History of anemia-she has mild chronic anemia.  History of hypertension-her blood pressure is well controlled.  Orders: No orders of the defined types were placed in this encounter.  No orders of the defined types were placed in this encounter.   Face-to-face time spent with patient was 30 minutes. Greater than 50% of time was spent in counseling and coordination of care.  Follow-Up Instructions: Return in about 3 months (around 06/13/2019) for Polymyalgia rheumatica.   Bo Merino, MD  Note - This record has been created using Editor, commissioning.  Chart creation errors have been sought, but may not always  have been located. Such creation errors do not reflect on  the standard of medical care.

## 2019-03-22 ENCOUNTER — Telehealth: Payer: Self-pay | Admitting: *Deleted

## 2019-03-22 NOTE — Telephone Encounter (Signed)
Labs received from [facility] reviewed by Dr. Bo Merino Labs drawn 03/12/2019 Labs are normal except the following:  RBC 3.6 Hgb 11.8 Hct 35.0 MCV 98.0 MCH 33.1 RDW 11.4 Sed Rate 75  Patient on PLQ 200 mg 1 tablet daily

## 2019-03-24 ENCOUNTER — Ambulatory Visit: Payer: Medicare HMO | Attending: Internal Medicine

## 2019-03-24 DIAGNOSIS — Z23 Encounter for immunization: Secondary | ICD-10-CM

## 2019-03-24 NOTE — Progress Notes (Signed)
   Covid-19 Vaccination Clinic  Name:  Gloria Anderson    MRN: [id number] DOB: 1934-01-26  03/24/2019  [name] was observed post Covid-19 immunization for 15 minutes without incidence. She was provided with Vaccine Information Sheet and instruction to access the V-Safe system.   [name] was instructed to call 911 with any severe reactions post vaccine: Marland Kitchen Difficulty breathing  . Swelling of your face and throat  . A fast heartbeat  . A bad rash all over your body  . Dizziness and weakness    Immunizations Administered    Name Date Dose VIS Date Route   Pfizer COVID-19 Vaccine 03/24/2019 10:52 AM 0.3 mL 02/09/2019 Intramuscular   Manufacturer: [place]   Lot: [id number]   [place]: [id number]

## 2019-03-25 ENCOUNTER — Other Ambulatory Visit: Payer: Self-pay | Admitting: Cardiology

## 2019-03-25 DIAGNOSIS — I10 Essential (primary) hypertension: Secondary | ICD-10-CM

## 2019-04-02 ENCOUNTER — Other Ambulatory Visit: Payer: Self-pay | Admitting: Rheumatology

## 2019-04-02 NOTE — Telephone Encounter (Signed)
Last Visit: 03/15/19 Next Visit: 06/14/19 Labs: 03/12/19 RBC 3.6, Hgb 11.8, Hct 35.0, MVC 98, MCH 33.1, RDW 11.4 Eye exam:  10/16/2018 normal  Okay to refill per Dr. Estanislado Pandy

## 2019-04-13 ENCOUNTER — Telehealth: Payer: Self-pay | Admitting: Rheumatology

## 2019-04-13 ENCOUNTER — Ambulatory Visit: Payer: Medicare HMO | Attending: Internal Medicine

## 2019-04-13 DIAGNOSIS — M353 Polymyalgia rheumatica: Secondary | ICD-10-CM

## 2019-04-13 DIAGNOSIS — R700 Elevated erythrocyte sedimentation rate: Secondary | ICD-10-CM

## 2019-04-13 DIAGNOSIS — Z23 Encounter for immunization: Secondary | ICD-10-CM

## 2019-04-13 NOTE — Telephone Encounter (Signed)
Patient is due to repeat sed rate. I have released the order and it is at the front desk ready for pick up.

## 2019-04-13 NOTE — Telephone Encounter (Signed)
Patient going to PCP on Monday, and would like to have orders printed to take with her for labwork that Dr. Estanislado Pandy wants her to have. Patient states last time she went to PCP our orders got misplaced. Therefore, patient would like to hand deliver the orders. Patient will come by Monday to pick orders up.

## 2019-04-13 NOTE — Progress Notes (Signed)
   Covid-19 Vaccination Clinic  Name:  Gloria Anderson    MRN: [id number] DOB: [birth year]  04/13/2019  [name] was observed post Covid-19 immunization for 15 minutes without incidence. She was provided with Vaccine Information Sheet and instruction to access the V-Safe system.   [name] was instructed to call 911 with any severe reactions post vaccine: Marland Kitchen Difficulty breathing  . Swelling of your face and throat  . A fast heartbeat  . A bad rash all over your body  . Dizziness and weakness    Immunizations Administered    Name Date Dose VIS Date Route   Pfizer COVID-19 Vaccine 04/13/2019 10:16 AM 0.3 mL 02/09/2019 Intramuscular   Manufacturer: [place]   Lot: [id number]   [place]: [id number]

## 2019-04-16 DIAGNOSIS — E7849 Other hyperlipidemia: Secondary | ICD-10-CM | POA: Diagnosis not present

## 2019-04-16 DIAGNOSIS — Z0000 Encounter for general adult medical examination without abnormal findings: Secondary | ICD-10-CM | POA: Diagnosis not present

## 2019-04-16 DIAGNOSIS — M5416 Radiculopathy, lumbar region: Secondary | ICD-10-CM | POA: Diagnosis not present

## 2019-04-18 ENCOUNTER — Other Ambulatory Visit: Payer: Self-pay

## 2019-04-18 DIAGNOSIS — I10 Essential (primary) hypertension: Secondary | ICD-10-CM

## 2019-04-18 MED ORDER — CARVEDILOL 3.125 MG PO TABS: ORAL_TABLET | ORAL | 3 refills | Status: DC

## 2019-04-23 DIAGNOSIS — E039 Hypothyroidism, unspecified: Secondary | ICD-10-CM | POA: Diagnosis not present

## 2019-04-23 DIAGNOSIS — H04123 Dry eye syndrome of bilateral lacrimal glands: Secondary | ICD-10-CM | POA: Diagnosis not present

## 2019-04-23 DIAGNOSIS — Z0000 Encounter for general adult medical examination without abnormal findings: Secondary | ICD-10-CM | POA: Diagnosis not present

## 2019-04-23 DIAGNOSIS — M353 Polymyalgia rheumatica: Secondary | ICD-10-CM | POA: Diagnosis not present

## 2019-04-23 DIAGNOSIS — E785 Hyperlipidemia, unspecified: Secondary | ICD-10-CM | POA: Diagnosis not present

## 2019-04-23 DIAGNOSIS — I129 Hypertensive chronic kidney disease with stage 1 through stage 4 chronic kidney disease, or unspecified chronic kidney disease: Secondary | ICD-10-CM | POA: Diagnosis not present

## 2019-04-23 DIAGNOSIS — M810 Age-related osteoporosis without current pathological fracture: Secondary | ICD-10-CM | POA: Diagnosis not present

## 2019-04-23 DIAGNOSIS — D126 Benign neoplasm of colon, unspecified: Secondary | ICD-10-CM | POA: Diagnosis not present

## 2019-04-23 DIAGNOSIS — Z1331 Encounter for screening for depression: Secondary | ICD-10-CM | POA: Diagnosis not present

## 2019-04-23 DIAGNOSIS — Z1339 Encounter for screening examination for other mental health and behavioral disorders: Secondary | ICD-10-CM | POA: Diagnosis not present

## 2019-04-23 DIAGNOSIS — I340 Nonrheumatic mitral (valve) insufficiency: Secondary | ICD-10-CM | POA: Diagnosis not present

## 2019-04-23 DIAGNOSIS — R69 Illness, unspecified: Secondary | ICD-10-CM | POA: Diagnosis not present

## 2019-04-26 ENCOUNTER — Telehealth: Payer: Self-pay

## 2019-04-26 NOTE — Telephone Encounter (Signed)
Sounds good

## 2019-04-26 NOTE — Telephone Encounter (Signed)
I Spoke to the pt she is gong to take 2 carvedilol's in the evening and keep track of her standing BP for 1 week and call us back and let us know how she is doing.

## 2019-04-26 NOTE — Telephone Encounter (Signed)
bp 04/22/19 was 79/54 standing                          128/68 sitting 151/91 sitting 108 /64  Standing   This morning  167/78 sitting 129/75 standing  carvediliol she only took 2 yesterday. And 2 this morning standing bp was  103-65  Should she take her night dose of CARVEDILOL.

## 2019-04-29 ENCOUNTER — Telehealth: Payer: Self-pay | Admitting: Cardiology

## 2019-04-29 DIAGNOSIS — I10 Essential (primary) hypertension: Secondary | ICD-10-CM | POA: Diagnosis not present

## 2019-04-29 NOTE — Telephone Encounter (Signed)
Let patient know that she should use Hydralazine if standing BP is >140 mm Hg. And see how she does. If not can come  in to adjust BP meds. With me or AK

## 2019-04-29 NOTE — Telephone Encounter (Signed)
Standing 159/87 mmHg Sitting 177/90 mmHg  Concerned about BP Currently on carvedilol 3.125 mg , takes 2 tabs at night.  Is not taking hydralazine.   Recommendation: Take carvedilol 3.125 mg, 2 tab twice daily.  Will defer to Dr. Einar Gip and Jeri Lager, NP if patient should come sooner for f/u than currently scheduled visit in April.   Time spent: 9 min  Levi Crass Esther Hardy, MD

## 2019-04-30 ENCOUNTER — Telehealth: Payer: Self-pay

## 2019-04-30 NOTE — Telephone Encounter (Signed)
Just have her take 1 tablet of Coreg this morning (3.125) and continue to hold hydralazine only for standing SBP> 150. Can resume 2 tablets of Coreg this afternoon. Let us know how she does with that

## 2019-04-30 NOTE — Telephone Encounter (Signed)
Pt called today after speaking with MP over the weekend. She said that her BP is now very low last night it was 82/50 standing. This moring it is 136/72, but she hasn't taken her morning dose of Carvedilol.should she make appointment to come in?

## 2019-05-01 ENCOUNTER — Ambulatory Visit: Payer: Medicare Other | Admitting: Physician Assistant

## 2019-05-02 ENCOUNTER — Ambulatory Visit: Payer: Medicare HMO | Admitting: Cardiology

## 2019-05-02 ENCOUNTER — Encounter: Payer: Self-pay | Admitting: Cardiology

## 2019-05-02 ENCOUNTER — Other Ambulatory Visit: Payer: Self-pay

## 2019-05-02 VITALS — BP 121/76 | HR 84 | Temp 98.3°F | Resp 16 | Ht 64.0 in | Wt 120.0 lb

## 2019-05-02 DIAGNOSIS — I951 Orthostatic hypotension: Secondary | ICD-10-CM | POA: Diagnosis not present

## 2019-05-02 DIAGNOSIS — I10 Essential (primary) hypertension: Secondary | ICD-10-CM | POA: Diagnosis not present

## 2019-05-02 NOTE — Progress Notes (Signed)
Primary Physician/Referring:  [name], MD  Patient ID: Gloria Anderson, female    DOB: Dec 23, 1933, 86 y.o.   MRN: [id number]  Chief Complaint  Patient presents with  . Hypertension    Follow up   HPI:    Gloria Anderson  is a 86 y.o. with with essential hypertension and orthostatic hypotension, mild hyperlipidemia, moderate MR and TR, moderate pulmonary hypertension, here for acute visit due to recent fluctuations in her BP. Blood pressure had previously been stable with 6.25 mg BID.  She recently called our office with complaints of low blood pressure and occasional episodes of elevated blood pressures. Coreg was decreased to 3.125 mg AM and 6.25 mg PM. Blood pressure is initially elevated in the morning around 150, but decreases to low 100's during the day. She also has hydralazine to use on a as needed basis. For the last 3 days, her blood pressure has been overall stable.   She has not noticed any significant dizziness since being compliant with support stockings. She does sleep on a wedge at night.   Past Medical History:  Diagnosis Date  . Arthritis   . Complication of anesthesia   . Constipation   . Depression   . GERD (gastroesophageal reflux disease)   . Hypertension   . Hypothyroidism   . Osteoporosis   . Pneumonia 2018  . Polymyalgia ([place])   . Polymyalgia rheumatica ([place])   . PONV (postoperative nausea and vomiting)    Past Surgical History:  Procedure Laterality Date  . COLONOSCOPY W/ POLYPECTOMY    . EYE SURGERY     both cataracts  . LUMBAR LAMINECTOMY/DECOMPRESSION MICRODISCECTOMY N/A 08/15/2018   Procedure: Lumbar three to Lumbar five Decompressive lumbar laminectomy;  Surgeon: Erline Levine, MD;  Location: [place];  Service: Neurosurgery;  Laterality: N/A;  . NASAL SINUS SURGERY    . SHOULDER SURGERY Right 2007   repair  . TONSILLECTOMY    . TRIGGER FINGER RELEASE  03/19/2011   Procedure:  left middle finger RELEASE TRIGGER FINGER/A-1 PULLEY;   Surgeon: Cammie Sickle., MD;  Location: [place];  Service: Orthopedics;  Laterality: Right;  Procedure:  Release Right Long and Ring Trigger Fingers, Release Left Long Trigger Finger, Injection Left Long Proximal Phalangeal Joint  . TRIGGER FINGER RELEASE Right    Ring finger, middle finger  . UPPER GASTROINTESTINAL ENDOSCOPY     Social History   Tobacco Use  . Smoking status: Never Smoker  . Smokeless tobacco: Never Used  Substance Use Topics  . Alcohol use: No    Alcohol/week: 0.0 standard drinks    ROS  Review of Systems  Cardiovascular: Negative for chest pain, claudication, dyspnea on exertion, leg swelling, orthopnea, palpitations and syncope.  Neurological: Negative for dizziness, focal weakness and headaches.  All other systems reviewed and are negative.  Objective  Blood pressure 121/76, pulse 84, temperature 98.3 F (36.8 C), temperature source Temporal, resp. rate 16, height 5\' 4"  (1.626 m), weight 120 lb (54.4 kg), SpO2 99 %.  Vitals with BMI 05/02/2019 03/15/2019 03/15/2019  Height 5\' 4"  - 5\' 4"   Weight 120 lbs - 119 lbs 13 oz  BMI 123XX123 - 99991111  Systolic 123XX123 [id number]  Diastolic 76 62 69  Pulse 84 79 75    Physical Exam  Constitutional: She is oriented to person, place, and time. Vital signs are normal. She appears well-developed and well-nourished.  Cardiovascular: Normal rate, regular rhythm and intact distal pulses.  Murmur heard. High-pitched  blowing holosystolic murmur is present with a grade of 2/6 at the apex. Pulses:      Carotid pulses are on the right side with bruit and on the left side with bruit.      Femoral pulses are on the right side with bruit and on the left side with bruit. Pulmonary/Chest: Effort normal and breath sounds normal. No accessory muscle usage. No respiratory distress.  Abdominal: She exhibits abdominal bruit.  Neurological: She is alert and oriented to person, place, and time.  Vitals reviewed.  Laboratory  examination:   Recent Labs    08/11/18 1145  NA 140  K 4.6  CL 105  CO2 25  GLUCOSE 110*  BUN 22  CREATININE 1.26*  CALCIUM 9.5  GFRNONAA 40*  GFRAA 46*   CrCl cannot be calculated (Patient's most recent lab result is older than the maximum 21 days allowed.).  CMP Latest Ref Rng & Units 08/11/2018 03/23/2018 09/20/2017  Glucose 70 - 99 mg/dL 110(H) 92 101(H)  BUN 8 - 23 mg/dL 22 21 23   Creatinine 0.44 - 1.00 mg/dL 1.26(H) 1.02(H) 1.13(H)  Sodium 135 - 145 mmol/L 140 140 139  Potassium 3.5 - 5.1 mmol/L 4.6 4.5 4.4  Chloride 98 - 111 mmol/L 105 101 103  CO2 22 - 32 mmol/L 25 30 26   Calcium 8.9 - 10.3 mg/dL 9.5 9.9 9.4  Total Protein 6.1 - 8.1 g/dL - 6.8 6.9  Total Bilirubin 0.2 - 1.2 mg/dL - 0.9 0.9  AST 10 - 35 U/L - 34 26  ALT 6 - 29 U/L - 28 16   CBC Latest Ref Rng & Units 08/11/2018 03/23/2018 09/20/2017  WBC 4.0 - 10.5 K/uL 5.3 5.2 5.8  Hemoglobin 12.0 - 15.0 g/dL 11.6(L) 11.4(L) 11.6(L)  Hematocrit 36.0 - 46.0 % 34.6(L) 34.0(L) 33.4(L)  Platelets 150 - 400 K/uL 183 182 201   Lipid Panel  No results found for: CHOL, TRIG, HDL, CHOLHDL, VLDL, LDLCALC, LDLDIRECT HEMOGLOBIN A1C No results found for: HGBA1C, MPG TSH No results for input(s): TSH in the last 8760 hours.  External labs   Medications and allergies  No Known Allergies   Current Outpatient Medications  Medication Instructions  . atorvastatin (LIPITOR) 10 mg, Oral, Daily  . BIOTIN FORTE PO 1 tablet, Oral, Daily  . Calcium Citrate (CITRACAL PO) 1,200 mg, Oral, Daily  . Carboxymethylcell-Hypromellose (GENTEAL OP) 1 drop, Both Eyes, 2 times daily PRN  . carvedilol (COREG) 3.125 MG tablet TAKE 1 TABLET(3.125 MG) BY MOUTH FOUR TIMES DAILY  . clonazePAM (KLONOPIN) 0.5 MG tablet Take half tablet at  as needed (0.25 MG) and 2 at bedtime.  Marland Kitchen denosumab (PROLIA) 60 mg, Subcutaneous, Every 6 months  . Docusate Calcium (STOOL SOFTENER PO) 100 mg, Oral, Daily  . fish oil-omega-3 fatty acids 1 g, Oral, Daily  . Garlic  123XX123 mg, Oral, Daily  . hydrALAZINE (APRESOLINE) 25 mg, Oral, 3 times daily PRN  . hydroxychloroquine (PLAQUENIL) 200 MG tablet TAKE 1 TABLET BY MOUTH  DAILY  . Iron-Vitamins (GERITOL PO) 1 tablet, Oral, Daily  . levothyroxine (SYNTHROID) 50 mcg, Oral, Daily before breakfast  . Lifitegrast (XIIDRA OP) 1 drop, Ophthalmic, 2 times daily  . omeprazole (PRILOSEC) 20 mg, Oral, Daily  . polyethylene glycol (MIRALAX / GLYCOLAX) 17 g, Oral, Daily PRN  . Probiotic Product (PROBIOTIC DAILY PO) 1 tablet, Oral, Daily    Radiology:  No results found.  Cardiac Studies:   Exercise Treadmill Stress Test 10/07/2017:  Indication: chest pain The patient  exercised on Bruce protocol for  06:19 min. Patient achieved  7.40 METS and reached HR  114 bpm, which is  82 % of maximum age-predicted HR.  Stress test terminated due to fatigue.   Exercise capacity was fair for age. HR Response to Exercise: Appropriate. BP Response to Exercise: Normal resting BP- appropriate response. Chest Pain: none. Arrhythmias: Occasional PVC s. Resting EKG demonstrates Normal sinus rhythm. ST Changes: With peak exercise there was no ST-T changes of ischemia.  Overall Impression:  Submaximal stress test with no ischemic changes. Continue primary/secondary prevention.  Echocardiogram 12/13/2018: Normal LV systolic function with EF 67%. Left ventricle cavity is normal in size. Normal left ventricular wall thickness. Normal global wall motion. Diastolic function could not be assessed due to severity of mitral regurgitation.  Moderate (Grade II) mitral regurgitation. Moderate tricuspid regurgitation. Moderate pulmonary hypertension. Estimated pulmonary artery systolic pressure is 50 mmHg.  Small circumferential pericardial effusion wth no hemodynamic compromise.  IVC is dilated with a respiratory response of <50%. Estimated RA pressure 10-15 mmHg. No significant change compared to previous study on 05/09/2018.   Assessment     ICD-10-CM   1. Orthostatic hypotension  I95.1   2. Essential hypertension  I10     EKG 05/12/2017: Normal sinus rhythm at rate of 66 bpm, borderline criteria for left abnormality, otherwise normal EKG.   No orders of the defined types were placed in this encounter.   There are no discontinued medications.   Recommendations:   Gloria Anderson  is a 86 y.o. with with essential hypertension and orthostatic hypotension, mild hyperlipidemia, moderate MR and TR, moderate pulmonary hypertension, here for acute visit due to recent fluctuations in her BP. Blood pressure had previously been stable with 6.25 mg BID.  I have again discussed orthostatic hypotension and supine hypertension. Will need to treat her standing blood pressure. She has not had any dizziness or syncope and is overall feeling well. She is currently taking 3.125 mg in the AM and 6.25 mg in PM with using as needed hydralazine 25 mg. As her blood pressure has overall been stable with this regimen, will continue with this. I have advised her that should her standing blood pressure again be elevated and having to regularly take hydralazine, will increase back her AM dose to 6.125 mg in the AM. I have provided instructions on when to take hydralazine for standing SBP> 140-150 or sitting SBP>160. Continue with adequate hydration, sleeping on a wedge at night, and use of support stockings.She will be a good candidate for remote home blood pressure monitoring for close monitoring and will arrange for this today. She has upcoming appt in April, and will keep this appt as scheduled.    Miquel Dunn, MSN, APRN, FNP-C [facility]. [place] Office: [phone] Fax: [phone]

## 2019-05-02 NOTE — Patient Instructions (Signed)
Supine hypertension and orthostatic hypotension  BP meds should adjusted based off standing  Take Coreg 1 tablet AM and 2 tablets PM  Take 1 tablet Hydralazine for standing systolic (top number) BP of greater than 150   Take 1 tablet of hydralazine for sitting systolic (top number) BP of greater than 160

## 2019-05-08 ENCOUNTER — Telehealth: Payer: Self-pay | Admitting: Pharmacist

## 2019-05-08 NOTE — Telephone Encounter (Signed)
Pt called to review BP readings. Pt states that she is doing well. 2 episodes SBP readings in 90s over the past week. Denies feeling light headed or dizzy. Had a recent episode of BP reading in 170s where she took 1 dose of Hydralazine that brought to BP down to 150s. Pt was reportedly frustrated and angry at the moment prior to checking. Educated on the normal physiological response to stress and anger on one's BP and recommended patient try to find ways to either remove herself from such stressful situations or being able to better manage those incidents. Reinforced proper BP technique. Encouraged pt to drink adequate fluids to ensure that her low BP readings aren't dehydration induced. Pt denies any issues or concerns with her current medications. Relevant home meds include carvedilol 3.125 mg AM + 6.25 mg PM and hydralazine PRN based on SBP reading. Discussed with patient to continue monitoring her standing BP daily. Pharmacist to continue following

## 2019-05-08 NOTE — Telephone Encounter (Signed)
Pharmacist called to review BP readings. Left VM for pt to call back

## 2019-05-14 ENCOUNTER — Telehealth: Payer: Self-pay | Admitting: Pharmacist

## 2019-05-14 NOTE — Telephone Encounter (Signed)
BP readings reviewed. Morning SBPs in the 90s. Pt complains of lightheadedness and dizziness in the morning when she wakes up. Reviewed pt with Dr. Einar Gip who recommended splitting pt's carvedilol dose from 3.25 mg AM + 6.25 mg PM to 3.25 mg TID. Relayed the information to patient. Will continue to monitor pt's BP and follow up

## 2019-05-16 IMAGING — MR MR LUMBAR SPINE W/O CM
4 of 5 series · 26 of 48 positions shown · non-contrast
Comparison: Prior radiographs from 04/10/2018

CLINICAL DATA: Initial evaluation for back pain with lower
extremity numbness for 4 months.

EXAM:
MRI LUMBAR SPINE WITHOUT CONTRAST
TECHNIQUE: Multiplanar, multisequence MR imaging of the lumbar spine was
performed. No intravenous contrast was administered.

[Series 3: T2 · sagittal · 4.0mm · 0.55mm/px · 5 of 14 slices shown (1 of 2)]
[im 1/14]
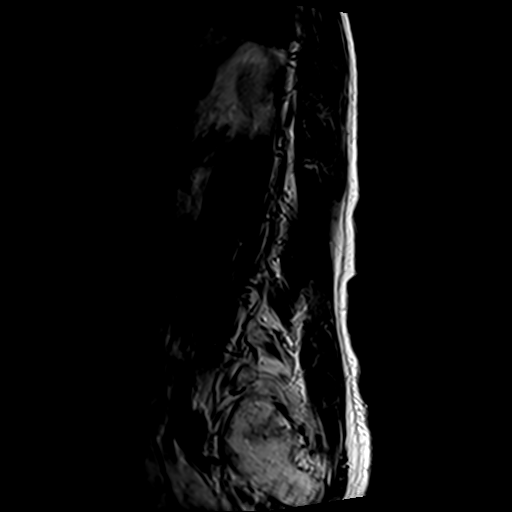
[im 4/14]
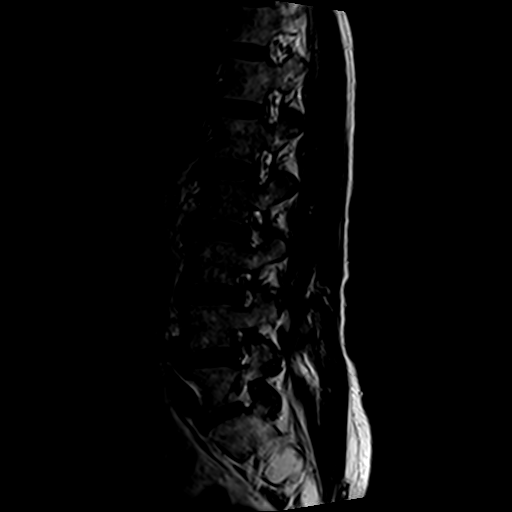
[im 7/14]
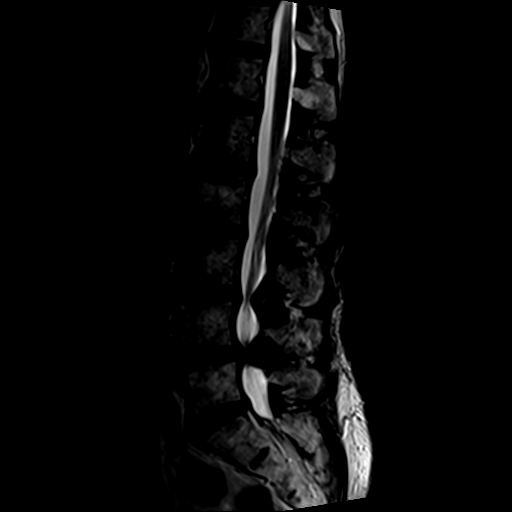
[im 10/14]
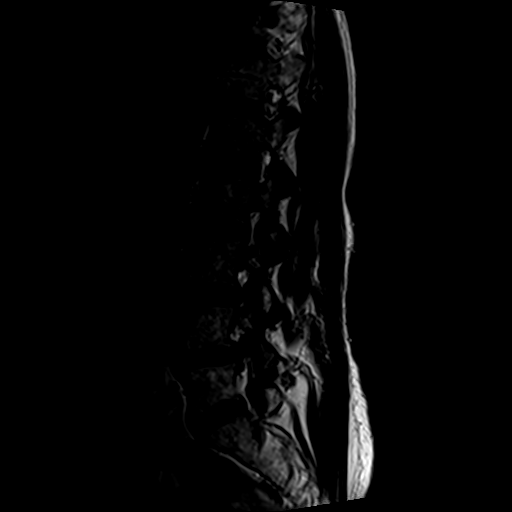
[im 14/14]
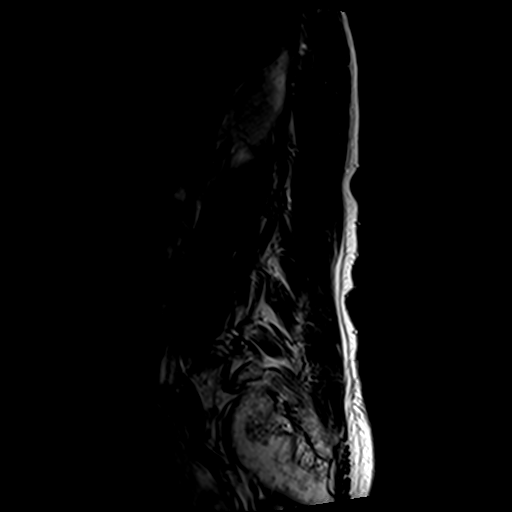

[Series 4: T1 · sagittal · 4.0mm · 0.55mm/px · 5 of 14 slices shown (1 of 2)]
[im 1/14]
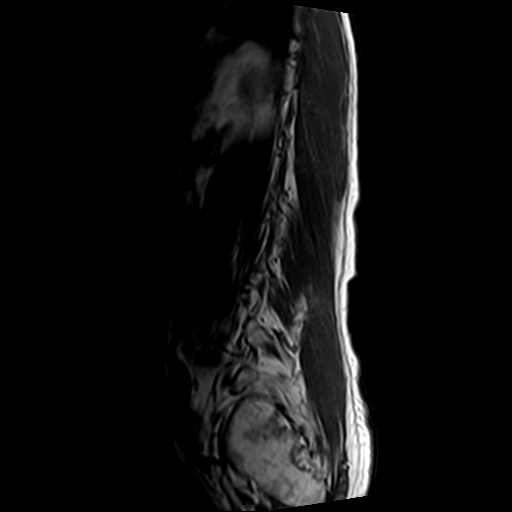
[im 4/14]
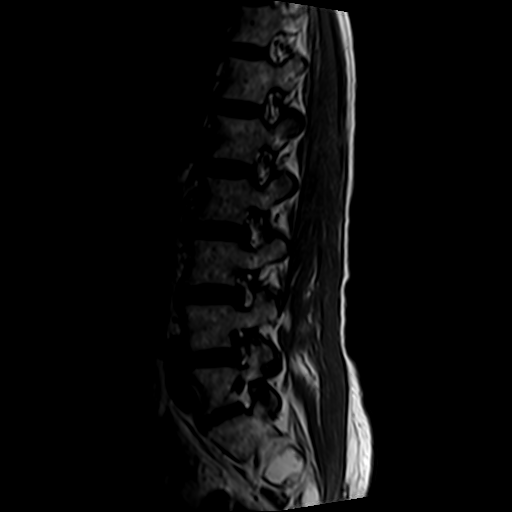
[im 7/14]
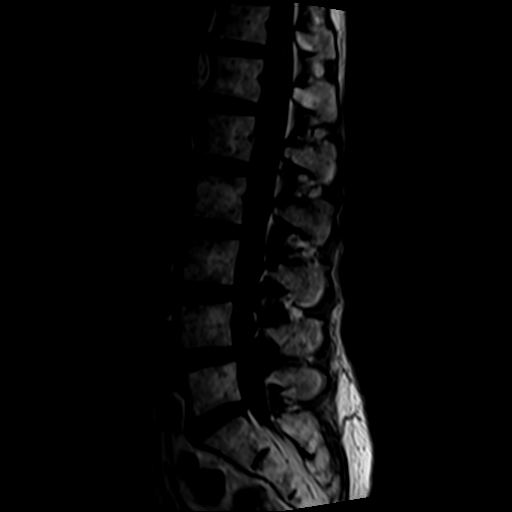
[im 10/14]
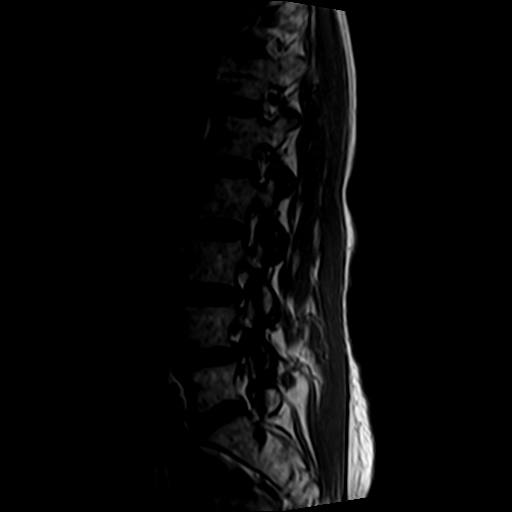
[im 14/14]
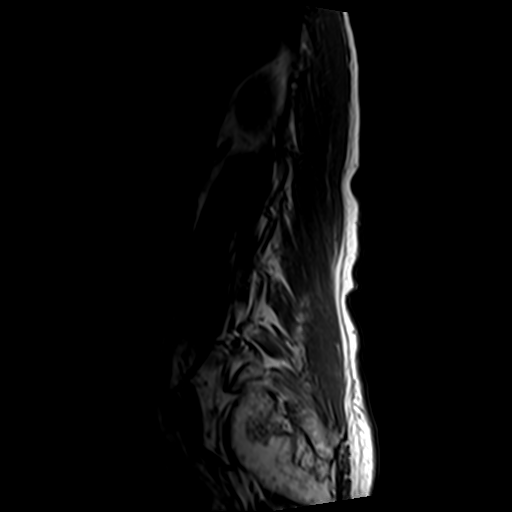

[Series 6: T2 · axial · 4.0mm · 0.70mm/px · z∈[-127,+71]mm · 10 of 39 slices shown (2 of 2)]
[im 3/39]
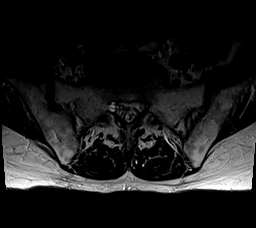
[im 6/39]
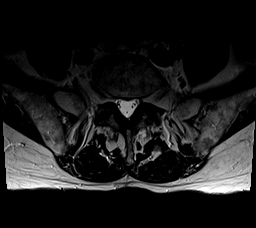
[im 8/39]
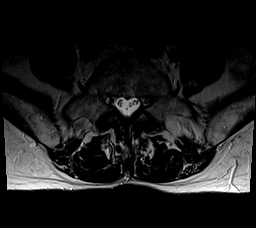
[im 13/39]
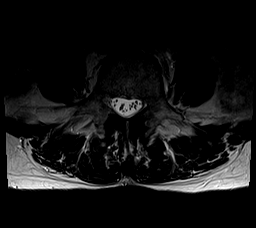
[im 18/39]
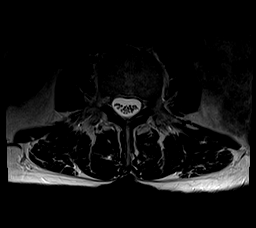
[im 21/39]
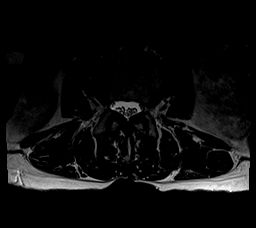
[im 23/39]
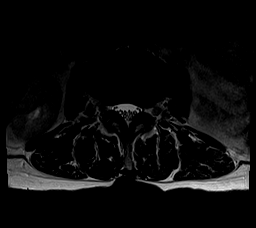
[im 28/39]
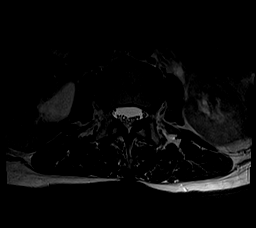
[im 33/39]
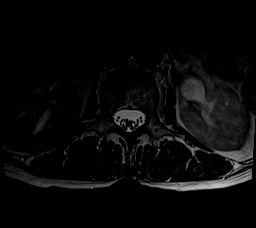
[im 39/39]
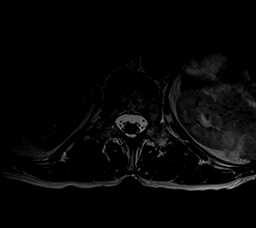

[Series 7: T1 · axial · 4.0mm · 0.35mm/px · z∈[-127,+41]mm · 6 of 39 slices shown (2 of 2)]
[im 3/39]
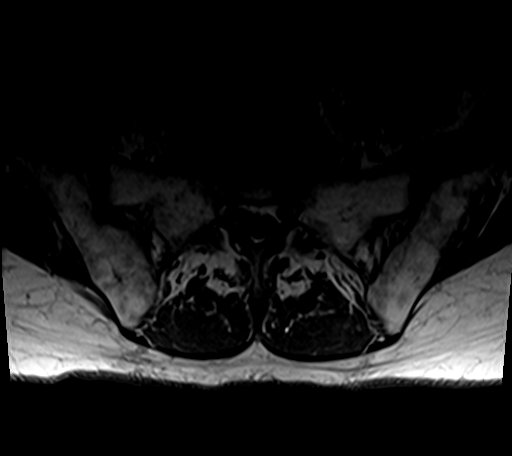
[im 6/39]
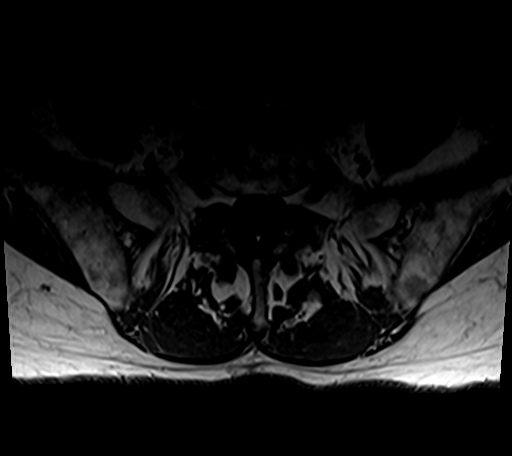
[im 8/39]
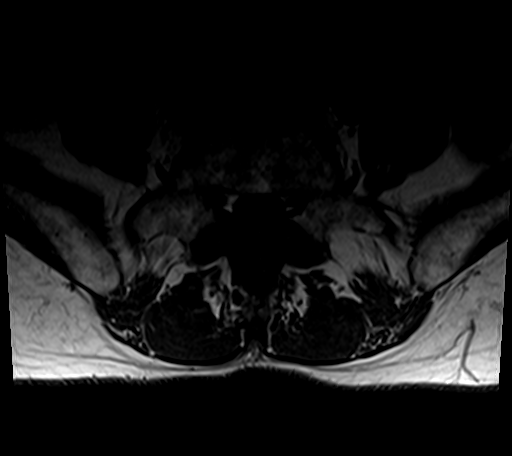
[im 13/39]
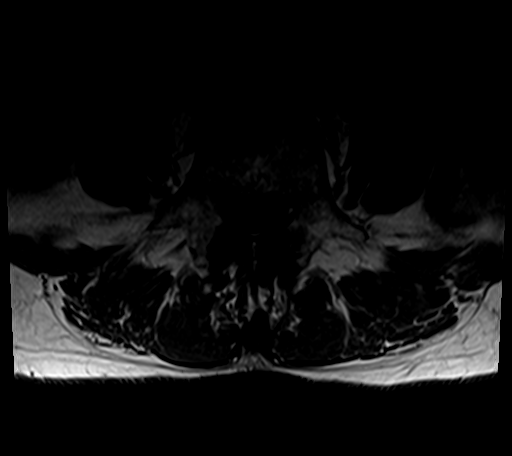
[im 21/39]
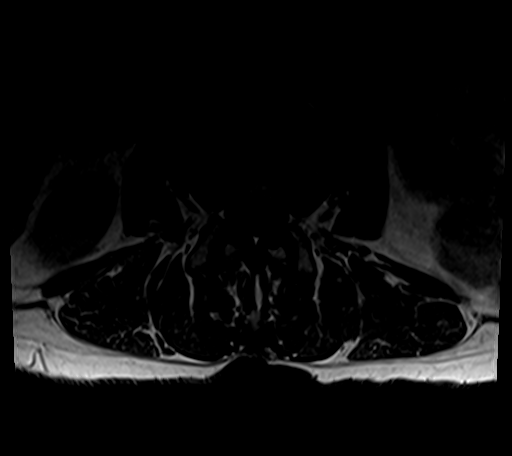
[im 33/39]
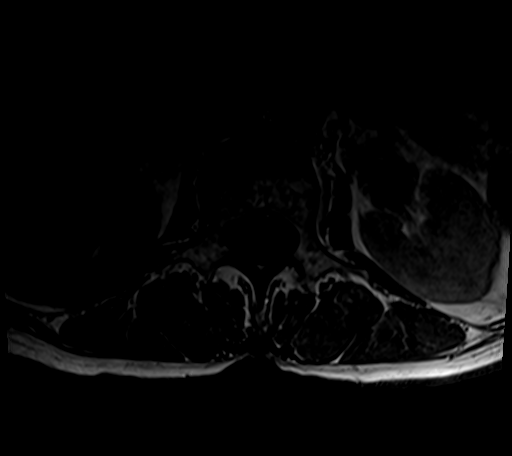

[26 of 48 positions shown; findings below may reference images not displayed]

FINDINGS: Segmentation: Standard. Lowest well-formed disc labeled the L5-S1
level.

Alignment: Straightening of the normal lumbar lordosis. Trace
retrolisthesis of L5 on S1 with trace anterolisthesis of L4 on L5.

Vertebrae: Vertebral body heights maintained without evidence for
acute or chronic fracture. Bone marrow signal intensity somewhat
heterogeneous but within normal limits. No discrete or worrisome
osseous lesions. Mild reactive endplate changes about the L4-5
interspace. No other abnormal marrow edema.

Conus medullaris and cauda equina: Conus extends to the L1 level.
Conus and cauda equina appear normal.

Paraspinal and other soft tissues: Simple right renal cyst noted.
Visualized visceral structures otherwise unremarkable. Paraspinous
soft tissues demonstrate no acute finding.

Disc levels:

L1-2: Mild disc bulge. Mild facet ligament flavum hypertrophy. No
stenosis.

L2-3: Minimal disc bulge. Mild to moderate facet and ligament flavum
hypertrophy. No significant stenosis.

L3-4: Left eccentric disc bulge with disc desiccation. Moderate
facet and ligament flavum hypertrophy. Resultant moderate to severe
canal with left greater than right lateral recess stenosis. Mild
left L3 foraminal narrowing.

L4-5: Diffuse disc bulge with disc desiccation. Mild reactive
endplate changes. Moderate facet and ligament flavum hypertrophy.
Resultant severe spinal stenosis. Thecal sac is nearly completely
effaced. Moderate left with mild right L4 foraminal stenosis.

L5-S1: Minimal disc bulge. Mild to moderate facet ligament flavum
hypertrophy. Resultant mild left greater than right lateral recess
stenosis. Foramina remain patent.
IMPRESSION: 1. Disc bulging with facet hypertrophy at L4-5 with resultant severe
spinal stenosis with near complete effacement of the thecal sac.
2. Left eccentric disc bulge with facet hypertrophy at L3-4 with
resultant moderate to severe canal with left greater than right
lateral recess stenosis.
3. Mild left L3 and right L4 foraminal stenosis, with moderate left
L4 foraminal narrowing due to disc bulge and facet hypertrophy.

## 2019-05-21 ENCOUNTER — Telehealth: Payer: Self-pay | Admitting: Neurology

## 2019-05-21 NOTE — Telephone Encounter (Signed)
Please work her in - I need to see her.

## 2019-05-21 NOTE — Telephone Encounter (Signed)
Pt called stating that she had an episode Saturday night and is wanting to discuss with RN. Please advise.

## 2019-05-21 NOTE — Telephone Encounter (Signed)
Called the patient back. She wanted to inform that she has been having difficulties with her BP an Saturday her BP was elevated. She took an additional BP pill to help lower that. That night she went to sleep. On Sunday am 4 am, she states she woke up to a fall. It appears that she had gotten out of the bed, walked down the hall way she must had tripped on carpet and fell. She says she remembers she could see what she thought looked like a cloud and she was chasing after it and then she fell. She doesn't think she broke anything and denies hitting her head. She states that she has bruising on her bottom and tender in between her shoulder blades. This has concerned her and she initially asked to come in and see the MD. I didn't have availability for the patient to offer that worked around other apts. She asked if I could pass this along to Dr Brett Fairy and see if she feels she needs to come in and be seen or if there needs to be changes made to medications. She is asking for her recommendations. Advised I would inform her and contact her back with what her recommendations are.

## 2019-05-21 NOTE — Telephone Encounter (Signed)
Called the patient back, there was no answer. LVM on mobile device for the patient to call back.  When patient calls back please offer a couple options.  3/23 at 11:30 am 3/24 at 9:30 am or 11:30 am 3/25 at 11 am or 11:30 am

## 2019-05-23 ENCOUNTER — Ambulatory Visit: Payer: Medicare HMO | Admitting: Neurology

## 2019-05-23 ENCOUNTER — Other Ambulatory Visit: Payer: Self-pay

## 2019-05-23 ENCOUNTER — Encounter: Payer: Self-pay | Admitting: Neurology

## 2019-05-23 VITALS — BP 162/70 | HR 77 | Temp 97.6°F | Ht 64.0 in | Wt 119.0 lb

## 2019-05-23 DIAGNOSIS — M4807 Spinal stenosis, lumbosacral region: Secondary | ICD-10-CM | POA: Diagnosis not present

## 2019-05-23 DIAGNOSIS — G4750 Parasomnia, unspecified: Secondary | ICD-10-CM

## 2019-05-23 DIAGNOSIS — Q282 Arteriovenous malformation of cerebral vessels: Secondary | ICD-10-CM | POA: Insufficient documentation

## 2019-05-23 DIAGNOSIS — M353 Polymyalgia rheumatica: Secondary | ICD-10-CM | POA: Diagnosis not present

## 2019-05-23 DIAGNOSIS — M810 Age-related osteoporosis without current pathological fracture: Secondary | ICD-10-CM | POA: Diagnosis not present

## 2019-05-23 DIAGNOSIS — G40909 Epilepsy, unspecified, not intractable, without status epilepticus: Secondary | ICD-10-CM

## 2019-05-23 DIAGNOSIS — R569 Unspecified convulsions: Secondary | ICD-10-CM | POA: Insufficient documentation

## 2019-05-23 DIAGNOSIS — R419 Unspecified symptoms and signs involving cognitive functions and awareness: Secondary | ICD-10-CM | POA: Diagnosis not present

## 2019-05-23 DIAGNOSIS — Q048 Other specified congenital malformations of brain: Secondary | ICD-10-CM | POA: Insufficient documentation

## 2019-05-23 DIAGNOSIS — F513 Sleepwalking [somnambulism]: Secondary | ICD-10-CM | POA: Diagnosis not present

## 2019-05-23 DIAGNOSIS — R69 Illness, unspecified: Secondary | ICD-10-CM | POA: Diagnosis not present

## 2019-05-23 MED ORDER — APOAEQUORIN 10 MG PO CAPS: 10.0000 mg | ORAL_CAPSULE | Freq: Every day | ORAL | Status: DC

## 2019-05-23 NOTE — Progress Notes (Signed)
[facility]  Provider:  Larey Seat, MD    Referring Provider: [name], MD Primary Care Physician:  [name], MD  Chief Complaint  Patient presents with  . Follow-up    pt alone, rm 10, here to discuss a event that happened in her sleep the other night and caused a fall.    HPI:   This patient of Dr. 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 She tolerates Klonopin and is happy with the REM sleep control. She has developed higher blood pressures spurs of higher blood pressures that Dr. Forde Dandy and Dr. Einar Gip have followed, and initiated treatment- she developed orthostatic BP!Marland Kitchen She was found to be anemic, sleepy and fatigued. Has normal B 12 and started oral iron. She has a history of PMR. Polymyalgia rheumatica. She had a normal colonoscopy in 2016. Had lost weight , but recovered . Mrs. Belflower brought her last laboratory results with her, white blood cell count was 3.74K, red blood cell count was 2.9, hemoglobin 9.8 g, hematocrit 28.4%, corpuscular volume 97.1 MCH 33.5. She was over the phone prescribed Nu-iron to take as a supplement. Needs TIBC, Total iron deficiency,   09-26-13- The patient's sleep study from 08-05-13 revealed no apnea, no oxygen desaturation, no irregular heart beats, and only took clusters of limb movements that seem to be related to discomfort with sleeping on the left side. She had no periodic limb movements during REM sleep. There was a prolonged period of slow-wave sleep at around 2 AM during which nor night terrors sleep walking or sleep talking was ordered. The patient reported  that she had another spell- a feeling as if something brushes up on her scalp, or behind her head.  Since the patient has a long-standing bruit that she can hear at night and that bothers her( tinnitus) I have also suggested  to do an MRA of the brain , as I am concerned that they're tortuous vessels , which  may be affecting the left frontal lobe perfusion. This could be a trigger for a nocturnal event as described by the patient clinically. Her MRi documented to my surprise exactly that ! An intracerebral  Venous- cavernous malformation. We are now meeting on 09-26-13 to discuss the results and documented the findings. She had more Night terrors the last weeks. The gabapentin gave her diplopia. I suggest now Topiramate, but she reminded me of involuntary weight loss and loss of apetite. I will try a very low dose of KLONOPIN.  The patient should watch her BP and avoid straining , pressure inducing activities.   RV 02-27-14  Discussed EEG results, no epileptiform activity , but frontal sharps noted. These are of unclear significance.  See report. No change in meds.needed  Patient is free to travel to [place] in April.  The patient,  a caucasian right handed female , originally from [place], reports vivid dreams, with fearful frightful content.  Her dreams have let her to act out some of the dream content, she wakes with her arms extended and with palpitations, tachycardia form fright. She kicks and fell out of bed, yelling, calling out . Following  one dream last year,  she injured her face in a fall . She begun covering all sharp edges.  She has polymyalgia rheumatica and was treated with prednisone , gaining weight through this time She goes to bed around 11.15 Pm,  after watching TV in her living room. Falls asleep promptly , and wakes at 12.15 hearing a noise , that seemed to arise form behind her headboard- the noise goes away once she is awake. She could fall asleep again. Her acting out of dreams arises at 4 AM or later in the last stages of sleep and gets more frequently over the last year.  She rises at 8.30, the alarm wakes her at 8.  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 No childhood sleep disorder history. Usually a 7 -8 hour  sleeper , refreshed.  The patient is widowed for 12 years, has adult children and grandchildren.The bruit in her left temple keeps her form sleeping on the side. She has 3 carotid dopplers over 25 years, diagnosed with  tortious vessel.  MRI brain was normal 4 year ago ( Dr. Carloyn Manner)  No MRA was done at the time , I like to rule out a vascular abnormality at the frontal lobe.  08-04-2015 Her night terrors have responded to the medication, and her spells are rare now. She returned from [place] and had one event which "scared her sister to death "  I have spoken to Dr. Delice Lesch about the possible component of cavernous vascular malformations producing a seizure event. I ordered a 48 hour study but the patient underwent a 72 hour study. This was normal- no seizure activity,  She is doing well on Klonopin. She gained weight on klonopin, (a fact she likes!) She reports vertigo, dizzines with rapid head movementns and bending down, also controlled with Klonopin.  Her eye feel as if pulsating - can this be the AVM? HTN was addressed in the last 2 visits with endocrinology / PCP.  She joked today that her weight has been stabile for so long, she had no excuse for a new wardrobe !  She had no spells for the last 4 month. She needs no refills today.  Interval history from 02/03/2016. [name] reports today that after being almost free of vivid dreams for several month she has returned having some not to the same intensity and not the same frequency as prior to treatment. Some of them are scary they have the character of the night may or or night terror, and she responds was higher heart rate feeling under stress. Klonopin has been working very well at a very low dose for her by now for over 2 years. I would like to continue using Klonopin with the option that if the medication wears off she could increase the dose. She also does not have insomnia when taking Klonopin. She is not fatigued and she is not  excessively daytime sleepy, she also does not report that she is groggy in the mornings. She feels stable well-balanced and not at a risk of falling. I will refill her medication today. I also congratulated her to her 82th birthday, which she celebrates in style this coming Saturday.  Travel history from 15 February 2017, [name] has traveled to summer to [place] with some difficulties.  She had no medical problems however.  She brought me her Klonopin prescription but she had not needed to fill and which is now a year old.  I will refill her Klonopin through [facility] today. She has no complain of grogginess or mental cloudiness in the morning. She has night terrors. She has noted that she is not easily scared , not panicked.   07-26-2017, Zeynab Pikus is a 86 y.o. female patient for follow up  on concerns of cognitive function, memory.  She seems well, is alert and happy. She has a full social life.  She is relieved that the [facility] was normal 26-30 points. Balance is reportedly poor, she is careful.  She continues to report hip pain, and has some night spells- but much better than before Klonopin was initiated.  Was seen by Dr. Einar Gip cardiology for BP variability- he asked  Her to start wearing compression stockings and elevate her feet at night. She has not always hydrated well, and she is struggling with HTN and orthostatic lightheadedness.   Today 10/05/17:  Ms. Matura is an 86 year old female with a history of memory disturbance and night terrors.  She returns today to discuss her balance.  She states that she went to see her primary care after she had an episode while ambulating.  She reports that she was walking with a neighbor.  15 minutes in to her walk she lost sensation in the lower part of her back and in her ankles.  She reports that she felt like she had no control over her balance.  She states that she had to sit down.  She reports after that event she had some mild episodes  but since then this has resolved and she has not had any trouble walking.  She states that she went for a walk last night and did not have any symptoms.  She states that she has found if she uses a cane this offers her more stability.  In the past CT of the lumbar spine as well as MRI of the lumbar spine shows stenosis.  She denies any changes with her bowels or bladder.  Denies any numbness or tingling in the lower extremities.  She returns today for an evaluation.  HISTORY Tieler Lipnick is a 86 y.o. female patient for follow up on concerns of cognitive function, memory.  She seems well, is alert and happy. She has a full social life.  She is relieved that the [facility] was normal 26-30 points. Balance is reportedly poor, she is careful.  She continues to report hip pain, and has some night spells- but much better than before Klonopin was initiated.  Was seen by Dr. Einar Gip cardiology for BP variability- he asked  Her to start wearing compression stockings and elevate her feet at night. She has not always hydrated well, and she is struggling with HTN and orthostatic lightheadedness.   10-20-2017, Mrs. Boden, who is  86 years old reports a sleep walking episode that shook her- she dreamt about a scary situation, left her bed , walked about and finally woke up - realized she had dreamt and is not concerned about repeating this episodes with a risk of injury.  She started on Hydralazine as a new BP medication- could this have contributed/ she has been well controlled on Klonopin, and we will add trazodone tonight.   Interval history from 30 January 2018, I have the pleasure of meeting today with Mr. Jenny Reichmann [employer], this is not an emergency visit but a visit we had on our books for a while.  The patient performed today and a Montreal cognitive assessment and she scored 27 out of 30 points which is an entirely normal result.  Especially her short-term recall is excellent.  She had no trouble with naming  objects and doing mathematics or arithmetic.  She had normal attention she has some trouble with visual-spatial crest such as drawing or during the Trail Making Test.  This is  unchanged to previous results her Epworth sleepiness score was endorsed at 10 and her fatigue severity score was not endorsed.  Her medication list is available, she is using Klonopin 0.5 mg half tablet by mouth at bedtime. Sleep is fragmented but she feels its related to bathroom breaks. She reports nocturia 5-6 times, and she drinks water all day. No burning and no foul smelling urine.    RV 01-09-2019, Mrs Puyear is a meanwhile 86 year old lang time patient whom I refereed toDr Vertell Limber for pain- Mahogani Kleiber  is a 86 y.o. female  with essential hypertension and orthostatic hypotension, mild hyperlipidemia, moderate MR and TR, moderate pulmonary hypertension presents here for 6 month follow-up of hypertension and valvular heart disease.  She had back surgery in June 2020 and has not had back pain since then. 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 .  She constantly checks blood pressure and gets worried about elevated blood pressure, I had multiple phone calls in the past. Presently doing well and except for dizziness when she stands up or is on her feet for long period of time, remains asymptomatic, back pain has resolved. Since surgery she had some lightheadedness. Her HTN today is very well controlled, she is wearing compression stockings.   Rv 05-23-2019-  Mrs. Pennock describes a frightening sleep spell. She must have left her bed at 4 Am on a Sunday, and remembers dreaming that she reached out for something that moved away , could not be reached, but she remembers reaching out over and over-. She woke up having fallen in front of the door, and a corner of a carpet had turned over- she assumed she stumbled. She has no recollection of any of these complex  movements for a while. Had none of the previous spells of feeling someone breathing on her.  She does not have ge headaches, only hematoma on the left arm and elbow. No nausea.some neck stiffness, and she has had cervical evaluation with dr. Vertell Limber. No numbness in hands or loss of grip strength. Her bottom hurts, like sitting on her hip bones. .     Review of Systems: Out of a complete 14 system review, the patient complains of only the following symptoms, and all other reviewed systems are negative. Acting out dreams, yelling but not leaving the bed. Sleeps 7- 8 hours, nocturia.   Sleep walking-  05-19-2019 first repeat since 01-30-2018 .  Increased anxiety -Klonopin dependent , but this medication was needed to control her parasomnia.   Concerned about memory- MOCA was 26/ 30 points 2019, she has not mentioned any more problems. .      Social History   Socioeconomic History  . Marital status: Widowed    Spouse name: Not on file  . Number of children: 2  . Years of education: HS  . Highest education level: Not on file  Occupational History  . Occupation: RETIRED    Employer: [employer]  Tobacco Use  . Smoking status: Never Smoker  . Smokeless tobacco: Never Used  Substance and Sexual Activity  . Alcohol use: No    Alcohol/week: 0.0 standard drinks  . Drug use: Never  . Sexual activity: Not on file  Other Topics Concern  . Not on file  Social History Narrative   Patient is widowed.   Patient has two children.   Patient does not drink any caffeine.    Patient has a high school  education.   Patient is right-handed.            Social Determinants of Health   Financial Resource Strain:   . Difficulty of Paying Living Expenses:   Food Insecurity:   . Worried About Charity fundraiser in the Last Year:   . Arboriculturist in the Last Year:   Transportation Needs:   . Film/video editor (Medical):   Marland Kitchen Lack of Transportation (Non-Medical):   Physical Activity:    . Days of Exercise per Week:   . Minutes of Exercise per Session:   Stress:   . Feeling of Stress :   Social Connections:   . Frequency of Communication with Friends and Family:   . Frequency of Social Gatherings with Friends and Family:   . Attends Religious Services:   . Active Member of Clubs or Organizations:   . Attends Archivist Meetings:   Marland Kitchen Marital Status:   Intimate Partner Violence:   . Fear of Current or Ex-Partner:   . Emotionally Abused:   Marland Kitchen Physically Abused:   . Sexually Abused:     Family History  Problem Relation Age of Onset  . CVA Father   . Tuberculosis Mother   . Lung cancer Brother     Past Medical History:  Diagnosis Date  . Arthritis   . Complication of anesthesia   . Constipation   . Depression   . GERD (gastroesophageal reflux disease)   . Hypertension   . Hypothyroidism   . Osteoporosis   . Pneumonia 2018      . Polymyalgia rheumatica (HCC) on Plaquenil, not prednisone/   . PONV (postoperative nausea and vomiting)     Past Surgical History:  Procedure Laterality Date  . COLONOSCOPY W/ POLYPECTOMY    . EYE SURGERY     both cataracts  . LUMBAR LAMINECTOMY/DECOMPRESSION MICRODISCECTOMY N/A 08/15/2018   Procedure: Lumbar three to Lumbar five Decompressive lumbar laminectomy;  Surgeon: Erline Levine, MD;  Location: [place];  Service: Neurosurgery;  Laterality: N/A;  . NASAL SINUS SURGERY    . SHOULDER SURGERY Right 2007   repair  . TONSILLECTOMY    . TRIGGER FINGER RELEASE  03/19/2011   Procedure:  left middle finger RELEASE TRIGGER FINGER/A-1 PULLEY;  Surgeon: Cammie Sickle., MD;  Location: [place];  Service: Orthopedics;  Laterality: Right;  Procedure:  Release Right Long and Ring Trigger Fingers, Release Left Long Trigger Finger, Injection Left Long Proximal Phalangeal Joint  . TRIGGER FINGER RELEASE Right    Ring finger, middle finger  . UPPER GASTROINTESTINAL ENDOSCOPY      Current Outpatient  Medications  Medication Sig Dispense Refill  . atorvastatin (LIPITOR) 10 MG tablet Take 10 mg by mouth daily.     Marland Kitchen BIOTIN FORTE PO Take 1 tablet by mouth daily.     . Calcium Citrate (CITRACAL PO) Take 1,200 mg by mouth daily.     . Carboxymethylcell-Hypromellose (GENTEAL OP) Place 1 drop into both eyes 2 (two) times daily as needed.     . carvedilol (COREG) 3.125 MG tablet TAKE 1 TABLET(3.125 MG) BY MOUTH FOUR TIMES DAILY (Patient taking differently: TAKE 1 TABLET(3.125 MG) BY MOUTH THREE TIMES DAILY) 360 tablet 3  . clonazePAM (KLONOPIN) 0.5 MG tablet Take half tablet at  as needed (0.25 MG) and 2 at bedtime. 90 tablet 5  . denosumab (PROLIA) 60 MG/ML SOSY injection Inject 60 mg into the skin every 6 (six) months.    Marland Kitchen  Docusate Calcium (STOOL SOFTENER PO) Take 100 mg by mouth daily.     . fish oil-omega-3 fatty acids 1000 MG capsule Take 1 g by mouth daily.     . Garlic AB-[id number] MG TBEC Take 2,000 mg by mouth daily.     . hydrALAZINE (APRESOLINE) 25 MG tablet Take 1 tablet (25 mg total) by mouth 3 (three) times daily as needed (blood pressure >150). 270 tablet 3  . hydroxychloroquine (PLAQUENIL) 200 MG tablet TAKE 1 TABLET BY MOUTH  DAILY 90 tablet 0  . Iron-Vitamins (GERITOL PO) Take 1 tablet by mouth daily.     Marland Kitchen levothyroxine (SYNTHROID, LEVOTHROID) 50 MCG tablet Take 50 mcg by mouth daily before breakfast.     . Lifitegrast (XIIDRA OP) Apply 1 drop to eye 2 (two) times daily.    Marland Kitchen omeprazole (PRILOSEC) 20 MG capsule Take 20 mg by mouth daily.    . polyethylene glycol (MIRALAX / GLYCOLAX) packet Take 17 g by mouth daily as needed for moderate constipation.     . Probiotic Product (PROBIOTIC DAILY PO) Take 1 tablet by mouth daily.     No current facility-administered medications for this visit.    Allergies as of 05/23/2019  . (No Known Allergies)    Vitals: BP (!) 162/70   Pulse 77   Temp 97.6 F (36.4 C)   Ht 5\' 4"  (1.626 m)   Wt 119 lb (54 kg)   BMI 20.43 kg/m  Last Weight:   Wt Readings from Last 1 Encounters:  05/23/19 119 lb (54 kg)   Last Height:   Ht Readings from Last 1 Encounters:  05/23/19 5\' 4"  (1.626 m)    Physical exam:  General: The patient is awake, alert and appears not in acute distress. The patient is well groomed. Head: Normocephalic, atraumatic. Neck is supple. Mallampati 3 , retrognathia , neck circumference:14. 25". inches.  Cardiovascular:  Regular rate and rhythm , without  murmurs , has a left sided carotid bruit, no distended neck veins. Respiratory: Lungs are clear to auscultation. Skin:  Without evidence of edema, or rash Trunk:  normal posture.  Neurologic exam : [facility] Cognitive Assessment  01/09/2019 01/30/2018 07/26/2017 07/26/2017 02/03/2015  Visuospatial/ Executive (0/5) 2 3 3 3 4   Naming (0/3) 3 3 3 3 3   Attention: Read list of digits (0/2) 1 2 2 2 2   Attention: Read list of letters (0/1) 1 1 1 1 1   Attention: Serial 7 subtraction starting at 100 (0/3) 3 3 3 3 3   Language: Repeat phrase (0/2) 2 2 1 1 2   Language : Fluency (0/1) 1 0 0 0 1  Abstraction (0/2) 2 2 2 2 2   Delayed Recall (0/5) 5 5 5 5 3   Orientation (0/6) 6 6 6 6 6   Total 26 27 26 26 27   Adjusted Score (based on education) - - - - 27   her blood pressue ,   The patient is awake and alert, oriented to place and time.  Memory see above; we did not repeat MOCA.  She reports some delay in word-finding. Auditory processing speed- DELAYED, she reports she often has to ask on the phone for her speaking partner to repeat slowly and then is fully comprehending the message.   There is a normal attention span & concentration ability.  Speech is fluent with mild hoarseness - and no aphasia .Mood and affect are appropriate. Cranial nerves: Pupils are equal and briskly reactive to light.  Hearing to finger rub intact.  Facial sensation intact to fine touch. Facial motor strength is symmetric and tongue and uvula move midline.  Motor tone: normal, there is very little  muscle mass, no cog wheeling, grip strength and hip flexion, adduction and abduction. The hands hurt due to rheumatic condition.  Right hip pain.   Mrs. Studzinski gait is still very measured but not displaying difficulties with  balance, she can turn with 3.5 steps 180 degrees, she does not have a drift ,no propulsive tendency was noted. She can walk tandem (!) . Her right foot points a bit more outwards.   She could rise from a seated position in a chair without bracing herself, there is no tremor noted normal arm swing. There is also no focal weakness, focal sensory loss and good coordination.  Handwriting and drawing do not reveal any changes.  Cognitive testing as below was unremarkable.  The patient performed a Montreal cognitive assessment again- which is more difficult than the Mini-Mental Status Examination- and mastered it again well.   She has had back pain, had a CT scan ordered by dr Forde Dandy.  L3-4 and L 4-5 severe stenosis and likely foraminal stenosis as well.   Buttocks feel sore - sacroiliac pain rather than hip?  -No surgery for the neck was recommended and only massage was recommended.     After physical and neurologic examination, review of laboratory studies, imaging, neurophysiology testing and pre-existing records, assessment is :   PLAN:   stay on Klonopin, low dose of 0.25 mg nightly, will increase to 0.5 mg . Increasing the threshold to sleep walking.  Prevagen is an option,  word finding delay worries her.  Her MRi documented : intracerebral  venous- cavernous malformation. We discussed possible seizure activity- sometimes in form of vivid dreams.  I thank Dr. Vertell Limber, neurosurgery to  his successfull lumbar stenosis surgery, her neck was stiff- massage recommended.    RV every 12 month with me- MOCA next time in May 2021.   Larey Seat, MD     01-30-2018   Cc Dr Forde Dandy ,  Dr Einar Gip.  Dr Vertell Limber.

## 2019-05-23 NOTE — Patient Instructions (Signed)
Management of Memory Problems  There are some general things you can do to help manage your memory problems.  Your memory may not in fact recover, but by using techniques and strategies you will be able to manage your memory difficulties better.  1)  Establish a routine.  Try to establish and then stick to a regular routine.  By doing this, you will get used to what to expect and you will reduce the need to rely on your memory.  Also, try to do things at the same time of day, such as taking your medication or checking your calendar first thing in the morning.  Think about think that you can do as a part of a regular routine and make a list.  Then enter them into a daily planner to remind you.  This will help you establish a routine.  2)  Organize your environment.  Organize your environment so that it is uncluttered.  Decrease visual stimulation.  Place everyday items such as keys or cell phone in the same place every day (ie.  Basket next to front door)  Use post it notes with a brief message to yourself (ie. Turn off light, lock the door)  Use labels to indicate where things go (ie. Which cupboards are for food, dishes, etc.)  Keep a notepad and pen by the telephone to take messages  3)  Memory Aids  A diary or journal/notebook/daily planner  Making a list (shopping list, chore list, to do list that needs to be done)  Using an alarm as a reminder (kitchen timer or cell phone alarm)  Using cell phone to store information (Notes, Calendar, Reminders)  Calendar/White board placed in a prominent position  Post-it notes  In order for memory aids to be useful, you need to have good habits.  It's no good remembering to make a note in your journal if you don't remember to look in it.  Try setting aside a certain time of day to look in journal.  4)  Improving mood and managing fatigue.  There may be other factors that contribute to memory difficulties.  Factors, such as anxiety,  depression and tiredness can affect memory.  Regular gentle exercise can help improve your mood and give you more energy.  Simple relaxation techniques may help relieve symptoms of anxiety  Try to get back to completing activities or hobbies you enjoyed doing in the past.  Learn to pace yourself through activities to decrease fatigue.  Find out about some local support groups where you can share experiences with others.  Try and achieve 7-8 hours of sleep at night.  Memory Compensation Strategies  2. Use "WARM" strategy.  W= write it down  A= associate it  R= repeat it  M= make a mental note  2.   You can keep a Memory Notebook.  Use a 3-ring notebook with sections for the following: calendar, important names and phone numbers,  medications, doctors' names/phone numbers, lists/reminders, and a section to journal what you did  each day.   3.    Use a calendar to write appointments down.  4.    Write yourself a schedule for the day.  This can be placed on the calendar or in a separate section of the Memory Notebook.  Keeping a  regular schedule can help memory.  5.    Use medication organizer with sections for each day or morning/evening pills.  You may need help loading it  6.      Keep a basket, or pegboard by the door.  Place items that you need to take out with you in the basket or on the pegboard.  You may also want to  include a message board for reminders.  7.    Use sticky notes.  Place sticky notes with reminders in a place where the task is performed.  For example: " turn off the  stove" placed by the stove, "lock the door" placed on the door at eye level, " take your medications" on  the bathroom mirror or by the place where you normally take your medications.  8.    Use alarms/timers.  Use while cooking to remind yourself to check on food or as a reminder to take your medicine, or as a  reminder to make a call, or as a reminder to perform another task, etc.  

## 2019-05-29 DIAGNOSIS — I10 Essential (primary) hypertension: Secondary | ICD-10-CM | POA: Diagnosis not present

## 2019-05-30 DIAGNOSIS — E7849 Other hyperlipidemia: Secondary | ICD-10-CM | POA: Diagnosis not present

## 2019-05-30 DIAGNOSIS — Z961 Presence of intraocular lens: Secondary | ICD-10-CM | POA: Diagnosis not present

## 2019-05-30 DIAGNOSIS — I10 Essential (primary) hypertension: Secondary | ICD-10-CM | POA: Diagnosis not present

## 2019-05-30 DIAGNOSIS — H04123 Dry eye syndrome of bilateral lacrimal glands: Secondary | ICD-10-CM | POA: Diagnosis not present

## 2019-06-04 ENCOUNTER — Telehealth: Payer: Self-pay | Admitting: Cardiology

## 2019-06-04 DIAGNOSIS — I10 Essential (primary) hypertension: Secondary | ICD-10-CM | POA: Diagnosis not present

## 2019-06-04 NOTE — Telephone Encounter (Signed)
Patient called me stating that her blood pressure has been extremely high all day this afternoon.  Highest blood pressure according 180/97 mmHg.  She has already taken 1 extra dose of carvedilol and also hydralazine.  Advised her to take 50 mg of hydralazine x1 now and to check the blood pressure tomorrow morning and to call us back if blood pressure continues to remain high.  She is otherwise asymptomatic but extremely anxious and I reassured her.  10-minute telephone encounter.    ICD-10-CM   1. Essential hypertension  I10

## 2019-06-05 ENCOUNTER — Telehealth: Payer: Self-pay

## 2019-06-05 NOTE — Telephone Encounter (Signed)
Lab results received via fax from [facility].   05/30/2019  LIPID, LIVER  HDL 80, LDL/HDL RATIO 1.2, direct bilirubin 0.4.   Labs reviewed by Hazel Sams, PA-C. Will send document to scan center.

## 2019-06-06 NOTE — Progress Notes (Signed)
Office Visit Note  Patient: Gloria Anderson             Date of Birth: 1933-07-24           MRN: [id number]             PCP: [name], MD Referring: [name], MD Visit Date: 06/14/2019 Occupation: '@GUAROCC' @  Subjective:  Medication management.  History of Present Illness: Gloria Anderson is a 86 y.o. female with history of PMR. She is taking plaquenil 200 mg 1 tablet by mouth daily.  She states she continues to have some stiffness in her lower back and SI joints.  She denies any joint swelling.  She denies any muscle weakness.  Her shoulder joint discomfort is better.  She has occasional triggering of her right index finger.  She states she has been having dry eyes and has been seeing an ophthalmologist from [facility] which has been very helpful.  She has been also seeing Dr. Brett Fairy on the regular basis.  She has been doing exercises for balance and muscle strengthening.  Activities of Daily Living:  Patient reports morning stiffness for 0 none.   Patient Denies nocturnal pain.  Difficulty dressing/grooming: Denies Difficulty climbing stairs: Denies Difficulty getting out of chair: Reports Difficulty using hands for taps, buttons, cutlery, and/or writing: Reports  Review of Systems  Constitutional: Positive for fatigue.  HENT: Positive for mouth dryness. Negative for mouth sores and nose dryness.   Eyes: Positive for dryness. Negative for pain and visual disturbance.  Respiratory: Negative for cough, hemoptysis, shortness of breath and difficulty breathing.   Cardiovascular: Negative for chest pain, palpitations, hypertension and swelling in legs/feet.  Gastrointestinal: Positive for constipation. Negative for blood in stool and diarrhea.  Genitourinary: Negative for difficulty urinating and painful urination.  Musculoskeletal: Positive for muscle weakness. Negative for arthralgias, joint pain, joint swelling, myalgias, morning stiffness, muscle tenderness and  myalgias.  Skin: Positive for rash. Negative for color change, pallor, hair loss, nodules/bumps, skin tightness, ulcers and sensitivity to sunlight.  Allergic/Immunologic: Negative for susceptible to infections.  Neurological: Negative for dizziness, numbness and headaches.  Hematological: Negative for bruising/bleeding tendency and swollen glands.  Psychiatric/Behavioral: Negative for depressed mood and sleep disturbance. The patient is not nervous/anxious.     PMFS History:  Patient Active Problem List   Diagnosis Date Noted  . Sleep walking 05/23/2019  . Cognitive complaints with normal exam 05/23/2019  . Cerebral arteriovenous malformation (AVM) 05/23/2019  . 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 Nocturnal seizures ([place]) 05/23/2019  . Adult night terrors 01/09/2019  . Degenerative lumbar spinal stenosis 08/15/2018  . Memory difficulty 07/26/2017  . Other parasomnia 07/26/2017  . Anemia 06/02/2017  . Osteopenia 06/02/2017  . Spinal stenosis 06/02/2017  . Iron deficiency anemia secondary to inadequate dietary iron intake 08/05/2016  . Fatigue associated with anemia 08/05/2016  . Primary osteoarthritis of both feet 02/04/2016  . Essential hypertension 02/04/2016  . PMR (polymyalgia rheumatica) (HCC) 02/03/2016  . Osteoarthritis, hand 02/03/2016  . Age-related osteoporosis without current pathological fracture 02/03/2016  . DDD (degenerative disc disease), lumbar 02/03/2016  . High risk medication use 02/03/2016  . 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 Cerebral cavernous malformation type 1 12/04/2013  . Night terrors, adult 09/26/2013  . HEMORRHOIDS-EXTERNAL 09/19/2009  . GERD 09/19/2009  . CONSTIPATION 09/19/2009  . DYSPHAGIA 09/19/2009  . PERSONAL HISTORY OF COLONIC POLYPS 09/19/2009    Past Medical History:  Diagnosis Date  . Arthritis   . Complication of anesthesia   . Constipation   .  Depression   . GERD (gastroesophageal reflux disease)   . Hypertension   . Hypothyroidism   . Osteoporosis   . Pneumonia 2018  . Polymyalgia ([place])   . Polymyalgia rheumatica ([place])   . PONV (postoperative nausea and vomiting)     Family History  Problem Relation Age of Onset  . CVA Father   . Tuberculosis Mother   . Lung cancer Brother    Past Surgical History:  Procedure Laterality Date  . COLONOSCOPY W/ POLYPECTOMY    . EYE SURGERY     both cataracts  . LUMBAR LAMINECTOMY/DECOMPRESSION MICRODISCECTOMY N/A 08/15/2018   Procedure: Lumbar three to Lumbar five Decompressive lumbar laminectomy;  Surgeon: Erline Levine, MD;  Location: [facility];  Service: Neurosurgery;  Laterality: N/A;  . NASAL SINUS SURGERY    . SHOULDER SURGERY Right 2007   repair  . TONSILLECTOMY    . TRIGGER FINGER RELEASE  03/19/2011   Procedure:  left middle finger RELEASE TRIGGER FINGER/A-1 PULLEY;  Surgeon: Cammie Sickle., MD;  Location: [facility];  Service: Orthopedics;  Laterality: Right;  Procedure:  Release Right Long and Ring Trigger Fingers, Release Left Long Trigger Finger, Injection Left Long Proximal Phalangeal Joint  . TRIGGER FINGER RELEASE Right    Ring finger, middle finger  . UPPER GASTROINTESTINAL ENDOSCOPY     Social History   Social History Narrative   Patient is widowed.   Patient has two children.   Patient does not drink any caffeine.    Patient has a high school education.   Patient is right-handed.            Immunization History  Administered Date(s) Administered  . Influenza, High Dose Seasonal PF 12/19/2016  . PFIZER SARS-COV-2 Vaccination 03/24/2019, 04/13/2019  . Zoster Recombinat (Shingrix) 08/01/2017, 10/10/2017     Objective: Vital Signs: BP 132/72 (BP Location: Left Arm, Patient Position: Standing, Cuff Size: Normal)   Pulse 77   Resp 16   Ht '5\' 4"'  (1.626 m)   Wt 122 lb 12.8 oz (55.7 kg)   BMI 21.08 kg/m    Physical Exam Vitals and nursing  note reviewed.  Constitutional:      Appearance: She is well-developed.  HENT:     Head: Normocephalic and atraumatic.  Eyes:     Conjunctiva/sclera: Conjunctivae normal.  Pulmonary:     Effort: Pulmonary effort is normal.  Abdominal:     General: Bowel sounds are normal.     Palpations: Abdomen is soft.  Musculoskeletal:     Cervical back: Normal range of motion.  Lymphadenopathy:     Cervical: No cervical adenopathy.  Skin:    General: Skin is warm and dry.     Capillary Refill: Capillary refill takes less than 2 seconds.  Neurological:     Mental Status: She is alert and oriented to person, place, and time.  Psychiatric:        Behavior: Behavior normal.      Musculoskeletal Exam: C-spine was in good range of motion.  She has good range of motion of lumbar spine with some discomfort over right SI joint.  Shoulder joints elbow joints wrist joints with good range of motion.  She has DIP and PIP thickening.  She has right index trigger finger.  Hip joints, knee joints, ankles with good range of motion.  CDAI Exam: CDAI Score: -- Patient Global: --;  Provider Global: -- Swollen: --; Tender: -- Joint Exam 06/14/2019   No joint exam has been documented for this visit   There is currently no information documented on the homunculus. Go to the Rheumatology activity and complete the homunculus joint exam.  Investigation: No additional findings.  Imaging: No results found.  Recent Labs: Lab Results  Component Value Date   WBC 5.3 08/11/2018   HGB 11.6 (L) 08/11/2018   PLT 183 08/11/2018   NA 140 08/11/2018   K 4.6 08/11/2018   CL 105 08/11/2018   CO2 25 08/11/2018   GLUCOSE 110 (H) 08/11/2018   BUN 22 08/11/2018   CREATININE 1.26 (H) 08/11/2018   BILITOT 0.9 03/23/2018   AST 34 03/23/2018   ALT 28 03/23/2018   PROT 6.8 03/23/2018   CALCIUM 9.5 08/11/2018   GFRAA 46 (L) 08/11/2018   March 22, 2019 CBC hemoglobin 11.8, CMP normal, TSH normal, vitamin D 52, ESR  75  Repeat labs in February 2021 per patient sed rate 40 and LDL 80  Speciality Comments: PLQ eye exam: 10/16/2018 normal. Dr. Gershon Crane. Follow up in 1 year.  Procedures:  No procedures performed Allergies: Patient has no known allergies.    CBC and CMP were drawn on 03/12/19.  She is due to update lab work in June and every 5 months to monitor for drug toxicity.  Plaquenil eye exam: 10/16/18 was normal.  Follow up with Dr. Gershon Crane in 1 year.   Assessment / Plan:     Visit Diagnoses: PMR (polymyalgia rheumatica) (HCC)-she has no muscular weakness or tenderness on examination today.  She has been doing well on Plaquenil.  Elevated sed rate - ESR 75 on 03/14/19.  According to patient the repeat sed rate was 40.  High risk medication use - PLQ 200 mg p.o. daily. eye exam: 10/16/2018.  She has been advised to have repeat eye exam this year.  Her labs have been stable.  She will get labs in June with her PCP.  Primary osteoarthritis of both hands-joint protection muscle strengthening was discussed.  Primary osteoarthritis of both feet-she is currently not having any discomfort.  DDD (degenerative disc disease), cervical - she had surgery by Dr. Vertell Limber in June 2020 and had good results.  DDD (degenerative disc disease), lumbar-she has some discomfort in her lower back off and on.  She also has discomfort in her right SI joint.  Back exercises were discussed.  Muscle strengthening was discussed.  Balance exercises were demonstrated in the office.  Age-related osteoporosis without current pathological fracture - her last bone density in February 2020 was better.  She was placed on Tymlos.  Patient did not like self injecting.  She was placed on Prolia by Dr. Forde Dandy.  History of hypertension  History of gastroesophageal reflux (GERD)  History of anemia  Orders: No orders of the defined types were placed in this encounter.  No orders of the defined types were placed in this  encounter.   Face-to-face time spent with patient was 30 minutes. Greater than 50% of time was spent in counseling and coordination of care.  Follow-Up Instructions: Return in about 5 months (around 11/14/2019) for PMR, OA, Osteoporosis.   Bo Merino, MD  Note - This record has been created using Editor, commissioning.  Chart creation errors have been sought, but may not always  have been located. Such creation errors do not reflect on  the standard of medical care.

## 2019-06-13 ENCOUNTER — Other Ambulatory Visit (HOSPITAL_COMMUNITY): Payer: Self-pay | Admitting: *Deleted

## 2019-06-14 ENCOUNTER — Other Ambulatory Visit: Payer: Self-pay

## 2019-06-14 ENCOUNTER — Encounter: Payer: Self-pay | Admitting: Rheumatology

## 2019-06-14 ENCOUNTER — Ambulatory Visit: Payer: Medicare HMO | Admitting: Rheumatology

## 2019-06-14 ENCOUNTER — Ambulatory Visit (HOSPITAL_COMMUNITY)
Admission: RE | Admit: 2019-06-14 | Discharge: 2019-06-14 | Disposition: A | Discharge status: Home / Self Care | Payer: Medicare HMO | Source: Ambulatory Visit | Attending: Endocrinology | Admitting: Endocrinology

## 2019-06-14 VITALS — BP 132/72 | HR 77 | Resp 16 | Ht 64.0 in | Wt 122.8 lb

## 2019-06-14 DIAGNOSIS — M19042 Primary osteoarthritis, left hand: Secondary | ICD-10-CM

## 2019-06-14 DIAGNOSIS — M353 Polymyalgia rheumatica: Secondary | ICD-10-CM | POA: Diagnosis not present

## 2019-06-14 DIAGNOSIS — R700 Elevated erythrocyte sedimentation rate: Secondary | ICD-10-CM | POA: Diagnosis not present

## 2019-06-14 DIAGNOSIS — M19071 Primary osteoarthritis, right ankle and foot: Secondary | ICD-10-CM | POA: Diagnosis not present

## 2019-06-14 DIAGNOSIS — Z8719 Personal history of other diseases of the digestive system: Secondary | ICD-10-CM

## 2019-06-14 DIAGNOSIS — M19041 Primary osteoarthritis, right hand: Secondary | ICD-10-CM | POA: Diagnosis not present

## 2019-06-14 DIAGNOSIS — M5030 Other cervical disc degeneration, unspecified cervical region: Secondary | ICD-10-CM | POA: Diagnosis not present

## 2019-06-14 DIAGNOSIS — Z79899 Other long term (current) drug therapy: Secondary | ICD-10-CM

## 2019-06-14 DIAGNOSIS — M810 Age-related osteoporosis without current pathological fracture: Secondary | ICD-10-CM | POA: Diagnosis not present

## 2019-06-14 DIAGNOSIS — M19072 Primary osteoarthritis, left ankle and foot: Secondary | ICD-10-CM

## 2019-06-14 DIAGNOSIS — Z8679 Personal history of other diseases of the circulatory system: Secondary | ICD-10-CM | POA: Diagnosis not present

## 2019-06-14 DIAGNOSIS — M5136 Other intervertebral disc degeneration, lumbar region: Secondary | ICD-10-CM | POA: Diagnosis not present

## 2019-06-14 DIAGNOSIS — Z862 Personal history of diseases of the blood and blood-forming organs and certain disorders involving the immune mechanism: Secondary | ICD-10-CM

## 2019-06-14 MED ORDER — DENOSUMAB 60 MG/ML ~~LOC~~ SOSY
60.0000 mg | PREFILLED_SYRINGE | Freq: Once | SUBCUTANEOUS | Status: AC
  Administered 2019-06-14: 60 mg via SUBCUTANEOUS

## 2019-06-14 MED ORDER — DENOSUMAB 60 MG/ML ~~LOC~~ SOSY
PREFILLED_SYRINGE | SUBCUTANEOUS | Status: AC
  Filled 2019-06-14: qty 1

## 2019-06-21 NOTE — Progress Notes (Signed)
Primary Physician/Referring:  [name], MD  Patient ID: Gloria Anderson, female    DOB: Mar 22, 1933, 86 y.o.   MRN: [id number]  Chief Complaint  Patient presents with  . Orthostatic hypotension  . Hypertension    6 month   HPI:    Gloria Anderson  is a 86 y.o. 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  She is extremely sensitive to blood pressure variations.  Generally she has been tolerating carvedilol well without dizziness and fairly good control of hypertension.  She also has hydralazine to use on a as needed basis.  She is also enrolled in   in Remote Patient Monitoring and Principal Care Management as patient is high risk for hospitalization and complications from underlying medical conditions.   She has not noticed any significant dizziness since being compliant with support stockings. She does sleep on a wedge at night due to orthostatic hypertension.  She states that taking hydralazine and extra dose of carvedilol during times when her blood pressure is [id number] mmHg systolic has greatly helped her feeling secure and brings the blood pressure nicely down.  She has not had any dizziness or syncope.  No PND or orthopnea.  Continues to be anxious about her blood pressure issues.  Past Medical History:  Diagnosis Date  . Arthritis   . Complication of anesthesia   . Constipation   . Depression   . GERD (gastroesophageal reflux disease)   . Hypertension   . Hypothyroidism   . Osteoporosis   . Pneumonia 2018  . Polymyalgia ([place])   . Polymyalgia rheumatica ([place])   . PONV (postoperative nausea and vomiting)    Past Surgical History:  Procedure Laterality Date  . COLONOSCOPY W/ POLYPECTOMY    . EYE SURGERY     both cataracts  . LUMBAR LAMINECTOMY/DECOMPRESSION MICRODISCECTOMY N/A 08/15/2018   Procedure: Lumbar three to Lumbar five  Decompressive lumbar laminectomy;  Surgeon: Erline Levine, MD;  Location: [facility];  Service: Neurosurgery;  Laterality: N/A;  . NASAL SINUS SURGERY    . SHOULDER SURGERY Right 2007   repair  . TONSILLECTOMY    . TRIGGER FINGER RELEASE  03/19/2011   Procedure:  left middle finger RELEASE TRIGGER FINGER/A-1 PULLEY;  Surgeon: Cammie Sickle., MD;  Location: [facility];  Service: Orthopedics;  Laterality: Right;  Procedure:  Release Right Long and Ring Trigger Fingers, Release Left Long Trigger Finger, Injection Left Long Proximal Phalangeal Joint  . TRIGGER FINGER RELEASE Right    Ring finger, middle finger  . UPPER GASTROINTESTINAL ENDOSCOPY     Social History   Tobacco Use  . Smoking status: Never Smoker  . Smokeless tobacco: Never Used  Substance Use Topics  . Alcohol use: No    Alcohol/week: 0.0 standard drinks    ROS  Review of Systems  Cardiovascular: Negative for chest pain, claudication, dyspnea on exertion, leg swelling, orthopnea, palpitations and syncope.  Neurological: Negative for dizziness, focal weakness and headaches.  All other systems reviewed and are negative.  Objective  Blood pressure (!) 172/89, pulse 99, temperature 98 F (36.7 C), temperature source Temporal, resp. rate 18, height 5\' 4"  (1.626 m), weight 119 lb (54 kg), SpO2 100 %.  Vitals with BMI 06/22/2019 06/14/2019 06/14/2019  Height 5\' 4"  - 5\' 4"   Weight 119 lbs - 122 lbs 13 oz  BMI Q000111Q - [id number]  Systolic Q000111Q Q000111Q [id number]  Diastolic 89 72 64  Pulse 99 77 79    Orthostatic VS for the past 72 hrs (Last 3 readings):  Orthostatic BP Patient Position BP Location Cuff Size Orthostatic Pulse  06/22/19 1313 158/74 Standing Left Arm Normal 77  06/22/19 1311 172/82 Sitting Left Arm Normal 73  06/22/19 1309 177/80 Supine Left Arm Normal 75    Physical Exam  Constitutional: She is oriented to person, place, and time. Vital signs are normal. She appears well-developed and well-nourished.   Cardiovascular: Normal rate, regular rhythm and intact distal pulses.  Murmur heard.  Crescendo-decrescendo mid to late systolic murmur is present with a grade of 2/6 at the apex. Pulses:      Carotid pulses are on the right side with bruit and on the left side with bruit.      Femoral pulses are on the right side with bruit and on the left side with bruit. Pulmonary/Chest: Effort normal and breath sounds normal. No accessory muscle usage. No respiratory distress.  Abdominal: She exhibits abdominal bruit.  Neurological: She is alert and oriented to person, place, and time.  Vitals reviewed.  Laboratory examination:   Recent Labs    08/11/18 1145  NA 140  K 4.6  CL 105  CO2 25  GLUCOSE 110*  BUN 22  CREATININE 1.26*  CALCIUM 9.5  GFRNONAA 40*  GFRAA 46*   CrCl cannot be calculated (Patient's most recent lab result is older than the maximum 21 days allowed.).  CMP Latest Ref Rng & Units 08/11/2018 03/23/2018 09/20/2017  Glucose 70 - 99 mg/dL 110(H) 92 101(H)  BUN 8 - 23 mg/dL 22 21 23   Creatinine 0.44 - 1.00 mg/dL 1.26(H) 1.02(H) 1.13(H)  Sodium 135 - 145 mmol/L 140 140 139  Potassium 3.5 - 5.1 mmol/L 4.6 4.5 4.4  Chloride 98 - 111 mmol/L 105 101 103  CO2 22 - 32 mmol/L 25 30 26   Calcium 8.9 - 10.3 mg/dL 9.5 9.9 9.4  Total Protein 6.1 - 8.1 g/dL - 6.8 6.9  Total Bilirubin 0.2 - 1.2 mg/dL - 0.9 0.9  AST 10 - 35 U/L - 34 26  ALT 6 - 29 U/L - 28 16   CBC Latest Ref Rng & Units 08/11/2018 03/23/2018 09/20/2017  WBC 4.0 - 10.5 K/uL 5.3 5.2 5.8  Hemoglobin 12.0 - 15.0 g/dL 11.6(L) 11.4(L) 11.6(L)  Hematocrit 36.0 - 46.0 % 34.6(L) 34.0(L) 33.4(L)  Platelets 150 - 400 K/uL 183 182 201    External labs:  Cholesterol, total 183.000 m 05/30/2019 HDL 80 MG/DL 05/30/2019 LDL 93.000 mg 05/30/2019 Triglycerides 52.000 05/30/2019  A1C 5.000 % 03/12/2019 TSH 1.980 03/13/2019  Hemoglobin 11.800 g/ 03/12/2019  Creatinine, Serum 1.200 mg/ 04/16/2019 Potassium 4.600 08/11/2018 ALT (SGPT)  20.000 uni 05/30/2019   Medications and allergies  No Known Allergies   Current Outpatient Medications  Medication Instructions  . Apoaequorin 10 mg, Oral, Daily  . atorvastatin (LIPITOR) 10 mg, Oral, Daily  . BIOTIN FORTE PO 1 tablet, Oral, Daily  . Calcium Citrate (CITRACAL PO) 1,200 mg, Oral, Daily  . Carboxymethylcell-Hypromellose (GENTEAL OP) 1 drop, Both Eyes, 2 times daily PRN  . carvedilol (COREG) 3.125 MG tablet TAKE 1 TABLET(3.125 MG) BY MOUTH FOUR TIMES DAILY  . clonazePAM (KLONOPIN) 0.5 MG tablet Take half tablet at  as needed (0.25 MG) and 2 at bedtime.  Marland Kitchen denosumab (PROLIA) 60 mg, Subcutaneous, Every 6 months  . Docusate Calcium (STOOL SOFTENER PO) 240 mg, Oral, Daily  . fish oil-omega-3 fatty acids 1 g,  Oral, Daily  . Garlic 123XX123 mg, Oral, Daily  . hydrALAZINE (APRESOLINE) 25 mg, Oral, 3 times daily PRN  . hydroxychloroquine (PLAQUENIL) 200 MG tablet TAKE 1 TABLET BY MOUTH  DAILY  . Iron-Vitamins (GERITOL PO) 1 tablet, Oral, Daily  . levothyroxine (SYNTHROID) 50 mcg, Oral, Daily before breakfast  . Lifitegrast (XIIDRA OP) 1 drop, Ophthalmic, 2 times daily  . omeprazole (PRILOSEC) 20 mg, Oral, Daily  . polyethylene glycol (MIRALAX / GLYCOLAX) 17 g, Oral, Daily PRN  . Probiotic Product (PROBIOTIC DAILY PO) 1 tablet, Oral, Daily    Radiology:  No results found.  Cardiac Studies:   Exercise Treadmill Stress Test 10/07/2017:  Indication: chest pain The patient exercised on Bruce protocol for  06:19 min. Patient achieved  7.40 METS and reached HR  114 bpm, which is  82 % of maximum age-predicted HR.  Stress test terminated due to fatigue.   Exercise capacity was fair for age. HR Response to Exercise: Appropriate. BP Response to Exercise: Normal resting BP- appropriate response. Chest Pain: none. Arrhythmias: Occasional PVC s. Resting EKG demonstrates Normal sinus rhythm. ST Changes: With peak exercise there was no ST-T changes of ischemia.  Overall Impression:   Submaximal stress test with no ischemic changes. Continue primary/secondary prevention.  Echocardiogram 12/13/2018: Normal LV systolic function with EF 67%. Left ventricle cavity is normal in size. Normal left ventricular wall thickness. Normal global wall motion. Diastolic function could not be assessed due to severity of mitral regurgitation.  Moderate (Grade II) mitral regurgitation. Moderate tricuspid regurgitation. Moderate pulmonary hypertension. Estimated pulmonary artery systolic pressure is 50 mmHg.  Small circumferential pericardial effusion wth no hemodynamic compromise.  IVC is dilated with a respiratory response of <50%. Estimated RA pressure 10-15 mmHg. No significant change compared to previous study on 05/09/2018.  EKG:  EKG 06/22/2019: Normal sinus rhythm with rate of 75 bpm, left atrial enlargement, normal axis. Baseline artifact, non specific ST abnormality.  EKG 05/12/2017: Normal sinus rhythm at rate of 66 bpm, borderline criteria for left abnormality, otherwise normal EKG.   Assessment     ICD-10-CM   1. Orthostatic hypotension  I95.1 EKG 12-Lead  2. Essential hypertension  I10   3. Stage 3b chronic kidney disease  N18.32   4. Left carotid bruit  R09.89 PCV CAROTID DUPLEX (BILATERAL)    No orders of the defined types were placed in this encounter.   There are no discontinued medications.   Recommendations:   Ashyla Mullings  is a 86 y.o. 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  She is extremely sensitive to blood pressure variations.  Generally she has been tolerating carvedilol well without dizziness and fairly good control of hypertension.  She also has hydralazine to use on a as needed basis.  She is also enrolled in   in Remote Patient Monitoring and Principal Care Management as patient is high risk for  hospitalization and complications from underlying medical conditions.  I have again discussed orthostatic hypotension and supine hypertension. Will continue to treat her standing blood pressure and not supine or sitting BP.  She has been compliant with using support stockings and also has been sleeping on a wedge at night to reduce the risk from supine hypertension.  She has been responding well to a combination of carvedilol and hydralazine with supine blood pressure >150 mmHg which brings it down nicely to around 130 to 140 mmHg.  With  regard to mitral anticipator vegetation, suspect she probably has mitral valve prolapse.  No change in physical exam, no clinical evidence of heart failure, hence do not think she needs repeat echocardiogram.   I reviewed her external labs, renal function is remained stable, I discussed with her regarding chronic renal insufficiency.  She has new left carotid bruit that is fairly prominent, will obtain carotid artery duplex.   Adrian Prows, MD, [facility] 06/22/2019, 1:57 PM [facility] [place] Office: [phone]

## 2019-06-22 ENCOUNTER — Ambulatory Visit: Payer: Medicare HMO | Admitting: Cardiology

## 2019-06-22 ENCOUNTER — Encounter: Payer: Self-pay | Admitting: Cardiology

## 2019-06-22 ENCOUNTER — Other Ambulatory Visit: Payer: Self-pay

## 2019-06-22 VITALS — BP 172/89 | HR 99 | Temp 98.0°F | Resp 18 | Ht 64.0 in | Wt 119.0 lb

## 2019-06-22 DIAGNOSIS — N1832 Chronic kidney disease, stage 3b: Secondary | ICD-10-CM

## 2019-06-22 DIAGNOSIS — I10 Essential (primary) hypertension: Secondary | ICD-10-CM | POA: Diagnosis not present

## 2019-06-22 DIAGNOSIS — I951 Orthostatic hypotension: Secondary | ICD-10-CM | POA: Diagnosis not present

## 2019-06-22 DIAGNOSIS — R0989 Other specified symptoms and signs involving the circulatory and respiratory systems: Secondary | ICD-10-CM | POA: Diagnosis not present

## 2019-06-22 NOTE — Patient Instructions (Signed)
Hypotension As your heart beats, it forces blood through your body. Hypotension, commonly called low blood pressure, is when the force of blood pumping through your arteries is too weak. Arteries are blood vessels that carry blood from the heart throughout the body. Depending on the cause and severity, hypotension may be harmless (benign) or may cause serious problems (be critical). When blood pressure is too low, you may not get enough blood to your brain or to the rest of your organs. This can cause weakness, light-headedness, rapid heartbeat, and fainting. What are the causes? This condition may be caused by:  Blood loss.  Loss of body fluids (dehydration).  Heart problems.  Hormone (endocrine) problems.  Pregnancy.  Severe infection.  Lack of certain nutrients.  Severe allergic reactions (anaphylaxis).  Certain medicines, such as blood pressure medicine or medicines that make the body lose excess fluids (diuretics). Sometimes, hypotension may be caused by not taking medicine as directed, such as taking too much of a certain medicine. What increases the risk? The following factors may make you more likely to develop this condition:  Age. Risk increases as you get older.  Conditions that affect the heart or the central nervous system.  Taking certain medicines, such as blood pressure medicine or diuretics.  Being pregnant. What are the signs or symptoms? Common symptoms of this condition include:  Weakness.  Light-headedness.  Dizziness.  Blurred vision.  Fatigue.  Rapid heartbeat.  Fainting, in severe cases. How is this diagnosed? This condition is diagnosed based on:  Your medical history.  Your symptoms.  Your blood pressure measurement. Your health care provider will check your blood pressure when you are: ? Lying down. ? Sitting. ? Standing. A blood pressure reading is recorded as two numbers, such as "120 over 80" (or 120/80). The first ("top")  number is called the systolic pressure. It is a measure of the pressure in your arteries as your heart beats. The second ("bottom") number is called the diastolic pressure. It is a measure of the pressure in your arteries when your heart relaxes between beats. Blood pressure is measured in a unit called mm Hg. Healthy blood pressure for most adults is 120/80. If your blood pressure is below 90/60, you may be diagnosed with hypotension. Other information or tests that may be used to diagnose hypotension include:  Your other vital signs, such as your heart rate and temperature.  Blood tests.  Tilt table test. For this test, you will be safely secured to a table that moves you from a lying position to an upright position. Your heart rhythm and blood pressure will be monitored during the test. How is this treated? Treatment for this condition may include:  Changing your diet. This may involve eating more salt (sodium) or drinking more water.  Taking medicines to raise your blood pressure.  Changing the dosage of certain medicines you are taking that might be lowering your blood pressure.  Wearing compression stockings. These stockings help to prevent blood clots and reduce swelling in your legs. In some cases, you may need to go to the hospital for:  Fluid replacement. This means you will receive fluids through an IV.  Blood replacement. This means you will receive donated blood through an IV (transfusion).  Treating an infection or heart problems, if this applies.  Monitoring. You may need to be monitored while medicines that you are taking wear off. Follow these instructions at home: Eating and drinking   Drink enough fluid to keep your   urine pale yellow.  Eat a healthy diet, and follow instructions from your health care provider about eating or drinking restrictions. A healthy diet includes: ? Fresh fruits and vegetables. ? Whole grains. ? Lean meats. ? Low-fat dairy  products.  Eat extra salt only as directed. Do not add extra salt to your diet unless your health care provider told you to do that.  Eat frequent, small meals.  Avoid standing up suddenly after eating. Medicines  Take over-the-counter and prescription medicines only as told by your health care provider. ? Follow instructions from your health care provider about changing the dosage of your current medicines, if this applies. ? Do not stop or adjust any of your medicines on your own. General instructions   Wear compression stockings as told by your health care provider.  Get up slowly from lying down or sitting positions. This gives your blood pressure a chance to adjust.  Avoid hot showers and excessive heat as directed by your health care provider.  Return to your normal activities as told by your health care provider. Ask your health care provider what activities are safe for you.  Do not use any products that contain nicotine or tobacco, such as cigarettes, e-cigarettes, and chewing tobacco. If you need help quitting, ask your health care provider.  Keep all follow-up visits as told by your health care provider. This is important. Contact a health care provider if you:  Vomit.  Have diarrhea.  Have a fever for more than 2-3 days.  Feel more thirsty than usual.  Feel weak and tired. Get help right away if you:  Have chest pain.  Have a fast or irregular heartbeat.  Develop numbness in any part of your body.  Cannot move your arms or your legs.  Have trouble speaking.  Become sweaty or feel light-headed.  Faint.  Feel short of breath.  Have trouble staying awake.  Feel confused. Summary  Hypotension is when the force of blood pumping through your arteries is too weak.  Hypotension may be harmless (benign) or may cause serious problems (be critical).  Treatment for this condition may include changing your diet, changing your medicines, and wearing  compression stockings.  In some cases, you may need to go to the hospital for fluid or blood replacement. This information is not intended to replace advice given to you by your health care provider. Make sure you discuss any questions you have with your health care provider. Document Revised: 08/11/2017 Document Reviewed: 08/11/2017 Elsevier Patient Education  2020 [employer]  

## 2019-06-29 DIAGNOSIS — I10 Essential (primary) hypertension: Secondary | ICD-10-CM | POA: Diagnosis not present

## 2019-07-04 ENCOUNTER — Other Ambulatory Visit: Payer: Self-pay

## 2019-07-04 ENCOUNTER — Ambulatory Visit: Payer: Medicare HMO

## 2019-07-04 DIAGNOSIS — R0989 Other specified symptoms and signs involving the circulatory and respiratory systems: Secondary | ICD-10-CM

## 2019-07-09 ENCOUNTER — Ambulatory Visit: Payer: Medicare HMO | Admitting: Neurology

## 2019-07-09 ENCOUNTER — Other Ambulatory Visit: Payer: Self-pay

## 2019-07-09 ENCOUNTER — Encounter: Payer: Self-pay | Admitting: Neurology

## 2019-07-09 VITALS — BP 128/65 | HR 78 | Temp 98.1°F | Ht 64.0 in | Wt 119.0 lb

## 2019-07-09 DIAGNOSIS — R69 Illness, unspecified: Secondary | ICD-10-CM | POA: Diagnosis not present

## 2019-07-09 DIAGNOSIS — M810 Age-related osteoporosis without current pathological fracture: Secondary | ICD-10-CM | POA: Diagnosis not present

## 2019-07-09 DIAGNOSIS — F513 Sleepwalking [somnambulism]: Secondary | ICD-10-CM | POA: Diagnosis not present

## 2019-07-09 DIAGNOSIS — M4807 Spinal stenosis, lumbosacral region: Secondary | ICD-10-CM

## 2019-07-09 DIAGNOSIS — R419 Unspecified symptoms and signs involving cognitive functions and awareness: Secondary | ICD-10-CM

## 2019-07-09 DIAGNOSIS — G4759 Other parasomnia: Secondary | ICD-10-CM

## 2019-07-09 NOTE — Progress Notes (Signed)
[facility]  Provider:  Larey Seat, MD    Referring Provider: [name], MD Primary Care Physician:  [name], MD  Chief Complaint  Patient presents with  . Follow-up    Rm 11 alone- here for 6 month f/u reports she has been doing better since her last visit, no falls.    HPI:   This patient of Dr. 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 She tolerates Klonopin and is happy with the REM sleep control. She has developed higher blood pressures spurs of higher blood pressures that Dr. Forde Dandy and Dr. Einar Gip have followed, and initiated treatment- she developed orthostatic BP!Marland Kitchen She was found to be anemic, sleepy and fatigued. Has normal B 12 and started oral iron. She has a history of PMR. Polymyalgia rheumatica. She had a normal colonoscopy in 2016. Had lost weight , but recovered . Mrs. Thackston brought her last laboratory results with her, white blood cell count was 3.74K, red blood cell count was 2.9, hemoglobin 9.8 g, hematocrit 28.4%, corpuscular volume 97.1 MCH 33.5. She was over the phone prescribed Nu-iron to take as a supplement. Needs TIBC, Total iron deficiency,   09-26-13- The patient's sleep study from 08-05-13 revealed no apnea, no oxygen desaturation, no irregular heart beats, and only took clusters of limb movements that seem to be related to discomfort with sleeping on the left side. She had no periodic limb movements during REM sleep. There was a prolonged period of slow-wave sleep at around 2 AM during which nor night terrors sleep walking or sleep talking was ordered. The patient reported  that she had another spell- a feeling as if something brushes up on her scalp, or behind her head.  Since the patient has a long-standing bruit that she can hear at night and that bothers her( tinnitus) I have also suggested to  do an MRA of the brain , as I am concerned that they're tortuous vessels , which  may be affecting the left frontal lobe perfusion. This could be a trigger for a nocturnal event as described by the patient clinically. Her MRi documented to my surprise exactly that ! An intracerebral  Venous- cavernous malformation. We are now meeting on 09-26-13 to discuss the results and documented the findings. She had more Night terrors the last weeks. The gabapentin gave her diplopia. I suggest now Topiramate, but she reminded me of involuntary weight loss and loss of apetite. I will try a very low dose of KLONOPIN.  The patient should watch her BP and avoid straining , pressure inducing activities.   RV 02-27-14  Discussed EEG results, no epileptiform activity , but frontal sharps noted. These are of unclear significance.  See report. No change in meds.needed  Patient is free to travel to [place] in April.  The patient,  a caucasian right handed female , originally from [place], reports vivid dreams, with fearful frightful content.  Her dreams have let her to act out some of the dream content, she wakes with her arms extended and with palpitations, tachycardia form fright. She kicks and fell out of bed, yelling, calling out . Following  one dream last year,  she injured her face in a fall . She begun covering all sharp edges.  She has polymyalgia rheumatica and was treated with prednisone , gaining weight through this time She rises at 8.30, the alarm wakes her  at 8.  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 No childhood sleep disorder history. Usually a 7 -8 hour sleeper , refreshed.    Soc history : The patient is widowed for 12 years, has adult children and grandchildren.The bruit in her left temple keeps her form sleeping on the side. She has 3 carotid dopplers over 25 years, diagnosed with  tortious vessel.  MRI brain was normal 4 year ago ( Dr. Carloyn Manner)  No MRA was done at the time , I like to rule out a vascular abnormality  at the frontal lobe.  08-04-2015 Her night terrors have responded to the medication, and her spells are rare now. She returned from [place] and had one event which "scared her sister to death "  I have spoken to Dr. Delice Lesch about the possible component of cavernous vascular malformations producing a seizure event. I ordered a 48 hour study but the patient underwent a 72 hour study. This was normal- no seizure activity,  She is doing well on Klonopin. She gained weight on klonopin, (a fact she likes!) She reports vertigo, dizzines with rapid head movementns and bending down, also controlled with Klonopin.  Her eye feel as if pulsating - can this be the AVM? HTN was addressed in the last 2 visits with endocrinology / PCP.  She joked today that her weight has been stabile for so long, she had no excuse for a new wardrobe !  She had no spells for the last 4 month. She needs no refills today.  Interval history from 02/03/2016. [name] reports today that after being almost free of vivid dreams for several month she has returned having some not to the same intensity and not the same frequency as prior to treatment. Some of them are scary they have the character of the night may or or night terror, and she responds was higher heart rate feeling under stress. Klonopin has been working very well at a very low dose for her by now 86 years. I would like to continue using Klonopin with the option that if the medication wears off she could increase the dose. She also does not have insomnia when taking Klonopin. She is not fatigued and she is not excessively daytime sleepy, she also does not report that she is groggy in the mornings. She feels stable well-balanced and not at a risk of falling. I will refill her medication today. I also congratulated her to her 86th birthday, which she celebrates in style this coming Saturday.  Travel history from 15 February 2017, [name] has traveled to summer to  [place] with some difficulties.  She had no medical problems however.  She brought me her Klonopin prescription but she had not needed to fill and which is now a year old.  I will refill her Klonopin through [facility] today. She has no complain of grogginess or mental cloudiness in the morning. She has night terrors. She has noted that she is not easily scared , not panicked.   07-26-2017, Alizay Scalisi is a 86 y.o. female patient for follow up on concerns of cognitive function, memory.  She seems well, is alert and happy. She has a full social life.  She is relieved that the [facility] was normal 26-30 points. Balance is reportedly poor, she is careful.  She continues to report hip pain, and has some night spells- but much better than before Klonopin was initiated.  Was seen by Dr. Einar Gip cardiology for BP variability- he  asked  Her to start wearing compression stockings and elevate her feet at night. She has not always hydrated well, and she is struggling with HTN and orthostatic lightheadedness.   Today 10/05/17:  Ms. Kirschbaum is an 86 year old female with a history of memory disturbance and night terrors.  She returns today to discuss her balance.  She states that she went to see her primary care after she had an episode while ambulating.  She reports that she was walking with a neighbor.  15 minutes in to her walk she lost sensation in the lower part of her back and in her ankles.  She reports that she felt like she had no control over her balance.  She states that she had to sit down.  She reports after that event she had some mild episodes but since then this has resolved and she has not had any trouble walking.  She states that she went for a walk last night and did not have any symptoms.  She states that she has found if she uses a cane this offers her more stability.  In the past CT of the lumbar spine as well as MRI of the lumbar spine shows stenosis.  She denies any changes with her bowels or  bladder.  Denies any numbness or tingling in the lower extremities.  She returns today for an evaluation.  HISTORY Shameaka Glendinning is a 86 y.o. female patient for follow up on concerns of cognitive function, memory.  She seems well, is alert and happy. She has a full social life.  She is relieved that the [facility] was normal 26-30 points. Balance is reportedly poor, she is careful.  She continues to report hip pain, and has some night spells- but much better than before Klonopin was initiated.  Was seen by Dr. Einar Gip cardiology for BP variability- he asked  Her to start wearing compression stockings and elevate her feet at night. She has not always hydrated well, and she is struggling with HTN and orthostatic lightheadedness.   10-20-2017, Mrs. Ohmann, who is  86 years old reports a sleep walking episode that shook her- she dreamt about a scary situation, left her bed , walked about and finally woke up - realized she had dreamt and is not concerned about repeating this episodes with a risk of injury.  She started on Hydralazine as a new BP medication- could this have contributed/ she has been well controlled on Klonopin, and we will add trazodone tonight.   Interval history from 30 January 2018, I have the pleasure of meeting today with Mr. Jenny Reichmann [employer], this is not an emergency visit but a visit we had on our books for a while.  The patient performed today and a [place] cognitive assessment and she scored 27 out of 30 points which is an entirely normal result.  Especially her short-term recall is excellent.  She had no trouble with naming objects and doing mathematics or arithmetic.  She had normal attention she has some trouble with visual-spatial crest such as drawing or during the Trail Making Test.  This is unchanged to previous results her Epworth sleepiness score was endorsed at 10 and her fatigue severity score was not endorsed.  Her medication list is available, she is using Klonopin 0.5 mg  half tablet by mouth at bedtime. Sleep is fragmented but she feels its related to bathroom breaks. She reports nocturia 5-6 times, and she drinks water all day. No burning and no foul smelling urine.  RV 01-09-2019, Mrs Osmond is a meanwhile 86 year old lang time patient whom I refereed toDr Vertell Limber for pain- Devinn Steenberg  is a 86 y.o. female  with essential hypertension and orthostatic hypotension, mild hyperlipidemia, moderate MR and TR, moderate pulmonary hypertension presents here for 6 month follow-up of hypertension and valvular heart disease.  She had back surgery in June 2020 and has not had back pain since then. 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 .  She constantly checks blood pressure and gets worried about elevated blood pressure, I had multiple phone calls in the past. Presently doing well and except for dizziness when she stands up or is on her feet for long period of time, remains asymptomatic, back pain has resolved. Since surgery she had some lightheadedness. Her HTN today is very well controlled, she is wearing compression stockings.   Rv 05-23-2019-  Mrs. Ozburn describes a frightening sleep spell. She must have left her bed at 4 Am on a Sunday, and remembers dreaming that she reached out for something that moved away , could not be reached, but she remembers reaching out over and over-. She woke up having fallen in front of the door, and a corner of a carpet had turned over- she assumed she stumbled. She has no recollection of any of these complex movements for a while. Had none of the previous spells of feeling someone breathing on her.  She does not have ge headaches, only hematoma on the left arm and elbow. No nausea.some neck stiffness, and she has had cervical evaluation with dr. Vertell Limber. No numbness in hands or loss of grip strength. Her bottom hurts, like sitting on her hip bones.    Rv 07-09-2019; I  have the pleasure of seeing Jozlin Mitropoulos today and meanwhile 86 year old Caucasian lady of [place] origin whom I follow for parasomnias and arteriovenous malformations.  She has recently had a carotid Doppler study performed with Dr. Einar Gip and I could see her today the results with her she was very relieved to hear that she had no carotid artery stenosis plaque or flow abnormality.  In addition test of October last year she had an echocardiogram which was also in normal limits.  Today's Montreal cognitive assessment shows 26 out of 30 points which is normal, and the patient was actually able to draw a three-dimensional cube something she has not done in the past.  She recalled 5 out of 5 recall words.  So I am pleasantly supplies and she says that she feels more serene and confident since she has been starting to take her regimen upon recommendation by Dr. Forde Dandy Dr.Perini.  It is a very expenesive supplement.        Review of Systems: Out of a complete 14 system review, the patient complains of only the following symptoms, and all other reviewed systems are negative. Acting out dreams, yelling but not leaving the bed. Sleeps 7- 8 hours, nocturia.   Sleep walking-  05-19-2019 first repeat since 01-30-2018 .  Increased anxiety -Klonopin dependent , but this medication was needed to control her parasomnia.   Concerned about memory- MOCA was 26/ 30 points 2019, she has not mentioned any more problems. MOCA was today 07-09-2019 also 26/ 30 points.     Social History   Socioeconomic History  . Marital status: Widowed    Spouse name: Not on file  . Number of children: 2  . Years of education:  HS  . Highest education level: Not on file  Occupational History  . Occupation: RETIRED    Employer: [employer]  Tobacco Use  . Smoking status: Never Smoker  . Smokeless tobacco: Never Used  Substance and Sexual Activity  . Alcohol use: No    Alcohol/week: 0.0 standard drinks  . Drug use:  Never  . Sexual activity: Not on file  Other Topics Concern  . Not on file  Social History Narrative   Patient is widowed.   Patient has two children.   Patient does not drink any caffeine.    Patient has a high school education.   Patient is right-handed.            Social Determinants of Health   Financial Resource Strain:   . Difficulty of Paying Living Expenses:   Food Insecurity:   . Worried About Charity fundraiser in the Last Year:   . Arboriculturist in the Last Year:   Transportation Needs:   . Film/video editor (Medical):   Marland Kitchen Lack of Transportation (Non-Medical):   Physical Activity:   . Days of Exercise per Week:   . Minutes of Exercise per Session:   Stress:   . Feeling of Stress :   Social Connections:   . Frequency of Communication with Friends and Family:   . Frequency of Social Gatherings with Friends and Family:   . Attends Religious Services:   . Active Member of Clubs or Organizations:   . Attends Archivist Meetings:   Marland Kitchen Marital Status:   Intimate Partner Violence:   . Fear of Current or Ex-Partner:   . Emotionally Abused:   Marland Kitchen Physically Abused:   . Sexually Abused:     Family History  Problem Relation Age of Onset  . CVA Father   . Tuberculosis Mother   . Lung cancer Brother     Past Medical History:  Diagnosis Date  . Arthritis   . Complication of anesthesia   . Constipation   . Depression   . GERD (gastroesophageal reflux disease)   . Hypertension   . Hypothyroidism   . Osteoporosis   . Pneumonia 2018      . Polymyalgia rheumatica (HCC) on Plaquenil, not prednisone/   . PONV (postoperative nausea and vomiting)     Past Surgical History:  Procedure Laterality Date  . COLONOSCOPY W/ POLYPECTOMY    . EYE SURGERY     both cataracts  . LUMBAR LAMINECTOMY/DECOMPRESSION MICRODISCECTOMY N/A 08/15/2018   Procedure: Lumbar three to Lumbar five Decompressive lumbar laminectomy;  Surgeon: Erline Levine, MD;  Location:  [facility];  Service: Neurosurgery;  Laterality: N/A;  . NASAL SINUS SURGERY    . SHOULDER SURGERY Right 2007   repair  . TONSILLECTOMY    . TRIGGER FINGER RELEASE  03/19/2011   Procedure:  left middle finger RELEASE TRIGGER FINGER/A-1 PULLEY;  Surgeon: Cammie Sickle., MD;  Location: [facility];  Service: Orthopedics;  Laterality: Right;  Procedure:  Release Right Long and Ring Trigger Fingers, Release Left Long Trigger Finger, Injection Left Long Proximal Phalangeal Joint  . TRIGGER FINGER RELEASE Right    Ring finger, middle finger  . UPPER GASTROINTESTINAL ENDOSCOPY      Current Outpatient Medications  Medication Sig Dispense Refill  . Apoaequorin 10 MG CAPS Take 10 mg by mouth daily.    Marland Kitchen atorvastatin (LIPITOR) 10 MG tablet Take 10 mg by mouth daily.     Marland Kitchen  BIOTIN FORTE PO Take 1 tablet by mouth daily.     . Calcium Citrate (CITRACAL PO) Take 1,200 mg by mouth daily.     . Carboxymethylcell-Hypromellose (GENTEAL OP) Place 1 drop into both eyes 2 (two) times daily as needed.     . carvedilol (COREG) 3.125 MG tablet TAKE 1 TABLET(3.125 MG) BY MOUTH FOUR TIMES DAILY (Patient taking differently: TAKE 1 TABLET(3.125 MG) BY MOUTH THREE TIMES DAILY) 360 tablet 3  . clonazePAM (KLONOPIN) 0.5 MG tablet Take half tablet at  as needed (0.25 MG) and 2 at bedtime. 90 tablet 5  . denosumab (PROLIA) 60 MG/ML SOSY injection Inject 60 mg into the skin every 6 (six) months.    Mariane Baumgarten Calcium (STOOL SOFTENER PO) Take 240 mg by mouth daily.     . fish oil-omega-3 fatty acids 1000 MG capsule Take 1 g by mouth daily.     . Garlic [id number] MG TBEC Take 2,000 mg by mouth daily.     . hydrALAZINE (APRESOLINE) 25 MG tablet Take 1 tablet (25 mg total) by mouth 3 (three) times daily as needed (blood pressure >150). 270 tablet 3  . hydroxychloroquine (PLAQUENIL) 200 MG tablet TAKE 1 TABLET BY MOUTH  DAILY 90 tablet 0  . Iron-Vitamins (GERITOL PO) Take 1 tablet by mouth daily.     Marland Kitchen levothyroxine  (SYNTHROID, LEVOTHROID) 50 MCG tablet Take 50 mcg by mouth daily before breakfast.     . Lifitegrast (XIIDRA OP) Apply 1 drop to eye 2 (two) times daily.    Marland Kitchen omeprazole (PRILOSEC) 20 MG capsule Take 20 mg by mouth daily.    . polyethylene glycol (MIRALAX / GLYCOLAX) packet Take 17 g by mouth daily as needed for moderate constipation.     . Probiotic Product (PROBIOTIC DAILY PO) Take 1 tablet by mouth daily.     No current facility-administered medications for this visit.    Allergies as of 07/09/2019  . (No Known Allergies)    Vitals: BP 128/65   Pulse 78   Temp 98.1 F (36.7 C) (Temporal)   Ht 5\' 4"  (1.626 m)   Wt 119 lb (54 kg)   BMI 20.43 kg/m  Last Weight:  Wt Readings from Last 1 Encounters:  07/09/19 119 lb (54 kg)   Last Height:   Ht Readings from Last 1 Encounters:  07/09/19 5\' 4"  (1.626 m)    Physical exam:  General: The patient is awake, alert and appears not in acute distress. The patient is well groomed. Head: Normocephalic, atraumatic. Neck is supple. Mallampati 3 , retrognathia , neck circumference:14. 25". inches.  Cardiovascular:  Regular rate and rhythm , without  murmurs , has a left sided carotid bruit, no distended neck veins. Respiratory: Lungs are clear to auscultation. Skin:  Without evidence of edema, or rash Trunk:  normal posture.  Neurologic exam : [facility] Cognitive Assessment  07/09/2019 01/09/2019 01/30/2018 07/26/2017 07/26/2017  Visuospatial/ Executive (0/5) 3 2 3 3 3   Naming (0/3) 2 3 3 3 3   Attention: Read list of digits (0/2) 2 1 2 2 2   Attention: Read list of letters (0/1) 1 1 1 1 1   Attention: Serial 7 subtraction starting at 100 (0/3) 3 3 3 3 3   Language: Repeat phrase (0/2) 2 2 2 1 1   Language : Fluency (0/1) 0 1 0 0 0  Abstraction (0/2) 2 2 2 2 2   Delayed Recall (0/5) 5 5 5 5 5   Orientation (0/6) 6 6 6 6  6  Total 26 26 27 26 26   Adjusted Score (based on education) - - - - -   her blood pressue ,   The patient is awake and  alert, oriented to place and time.  Memory see above; we did not repeat MOCA.  She reports some delay in word-finding. Auditory processing speed- she felt subjectively delayed last visit - today not at all- she appears to be happier, more confident. What ever Prevagen does or doesn't do , it's not harmful to her-There is a normal attention span & concentration ability.  Speech is fluent with mild hoarseness - and no aphasia .Mood and affect are appropriate. Cranial nerves: Pupils are equal and briskly reactive to light.  Hearing to finger rub intact.   Facial sensation intact to fine touch. Facial motor strength is symmetric and tongue and uvula move again in midline.no tremor, no fasciculating.   Motor tone: normal, there is very little muscle mass, no cog wheeling, grip strength and hip flexion, adduction and abduction.  The hands hurt due to rheumatic condition. Right hip pain. This more than anything restricts ROM and fluidity of movements.    Mrs. Dion gait is still very measured but not displaying difficulties with  balance, she can turn with 3.5 steps 180 degrees, she does not have a drift ,no propulsive tendency was noted. She can walk tandem (!) . Her right foot points a bit more outwards.   She could rise from a seated position in a chair without bracing herself, there is no tremor noted normal arm swing. There is also no focal weakness, focal sensory loss and good coordination.  Handwriting and drawing do not reveal any changes.  Cognitive testing as below was unremarkable.  The patient performed a Montreal cognitive assessment again- which is more difficult than the Mini-Mental Status Examination- and mastered it again well.   She has had back pain, had a CT scan ordered by dr Forde Dandy.  L3-4 and L 4-5 severe stenosis and likely foraminal stenosis as well.   Buttocks feel sore - sacroiliac pain rather than hip?  -No surgery for the neck was recommended and only massage was  recommended.     After physical and neurologic examination, review of laboratory studies, imaging, neurophysiology testing and pre-existing records, assessment is :   PLAN:   stay on Klonopin, low dose of 0.25 mg nightly, will increase to 0.5 mg . Increasing the threshold to sleep walking. What ever Prevagen does or doesn't do , it's not harmful to her-There is a normal attention span & concentration ability.     RV every 10-12 month with me- MOCA next time in May 2022.   Larey Seat, MD     07-09-2019.   Cc Dr Forde Dandy ,  Dr Einar Gip.  Dr Vertell Limber.

## 2019-07-10 ENCOUNTER — Other Ambulatory Visit: Payer: Self-pay | Admitting: Neurology

## 2019-07-10 ENCOUNTER — Telehealth: Payer: Self-pay | Admitting: Rheumatology

## 2019-07-10 ENCOUNTER — Telehealth: Payer: Self-pay | Admitting: Neurology

## 2019-07-10 DIAGNOSIS — F514 Sleep terrors [night terrors]: Secondary | ICD-10-CM

## 2019-07-10 MED ORDER — CLONAZEPAM 0.5 MG PO TABS: ORAL_TABLET | ORAL | 5 refills | Status: DC

## 2019-07-10 MED ORDER — HYDROXYCHLOROQUINE SULFATE 200 MG PO TABS: 200.0000 mg | ORAL_TABLET | Freq: Every day | ORAL | 0 refills | Status: DC

## 2019-07-10 NOTE — Telephone Encounter (Signed)
Pt called stating that she is needing her clonazePAM (KLONOPIN) 0.5 MG tablet called in to [facility] for RX and [phone] for customer service.

## 2019-07-10 NOTE — Telephone Encounter (Signed)
Refill request sent to Dr Brett Fairy to have her send in for the patient. [place] registry verified

## 2019-07-10 NOTE — Telephone Encounter (Signed)
Patient called requesting prescription refill of Plaquenil to be sent to [facility] which is new specialty pharmacy with her [facility] insurance.  Patient is also requesting a return call to let her know when she is due for her Plaquenil eye exam.

## 2019-07-10 NOTE — Telephone Encounter (Signed)
Last Visit: 06/14/2019 Next Visit: due September 2021. Labs: 03/12/2019 Hgb 11.8, Hct 35, MCV 98, MCH 33.1, RDW 11.4, 05/30/2019 Direct Bilirubin 0.4  Eye exam: 10/16/2018 normal  Current Dose per office note on 06/14/2019: PLQ 200 mg p.o. daily.  Attempted to contact the patient and left message to advise patient she is due ti update PLQ eye exam in August and labs in June 2021. Also advised she will need to schedule follow up appointment in September 2021.   Okay to refill per Dr. Estanislado Pandy

## 2019-07-16 ENCOUNTER — Telehealth: Payer: Self-pay | Admitting: Rheumatology

## 2019-07-16 ENCOUNTER — Encounter: Payer: Self-pay | Admitting: Rheumatology

## 2019-07-16 ENCOUNTER — Ambulatory Visit (INDEPENDENT_AMBULATORY_CARE_PROVIDER_SITE_OTHER): Payer: Medicare HMO | Admitting: Rheumatology

## 2019-07-16 ENCOUNTER — Other Ambulatory Visit: Payer: Self-pay | Admitting: Neurology

## 2019-07-16 ENCOUNTER — Other Ambulatory Visit: Payer: Self-pay

## 2019-07-16 VITALS — BP 142/78 | HR 79 | Resp 13 | Ht 64.0 in | Wt 121.0 lb

## 2019-07-16 DIAGNOSIS — M5030 Other cervical disc degeneration, unspecified cervical region: Secondary | ICD-10-CM

## 2019-07-16 DIAGNOSIS — M65342 Trigger finger, left ring finger: Secondary | ICD-10-CM

## 2019-07-16 DIAGNOSIS — M5136 Other intervertebral disc degeneration, lumbar region: Secondary | ICD-10-CM | POA: Diagnosis not present

## 2019-07-16 DIAGNOSIS — R419 Unspecified symptoms and signs involving cognitive functions and awareness: Secondary | ICD-10-CM

## 2019-07-16 DIAGNOSIS — G8929 Other chronic pain: Secondary | ICD-10-CM

## 2019-07-16 DIAGNOSIS — Z9889 Other specified postprocedural states: Secondary | ICD-10-CM

## 2019-07-16 DIAGNOSIS — Z8719 Personal history of other diseases of the digestive system: Secondary | ICD-10-CM

## 2019-07-16 DIAGNOSIS — M19071 Primary osteoarthritis, right ankle and foot: Secondary | ICD-10-CM

## 2019-07-16 DIAGNOSIS — Z79899 Other long term (current) drug therapy: Secondary | ICD-10-CM

## 2019-07-16 DIAGNOSIS — R700 Elevated erythrocyte sedimentation rate: Secondary | ICD-10-CM | POA: Diagnosis not present

## 2019-07-16 DIAGNOSIS — M25512 Pain in left shoulder: Secondary | ICD-10-CM

## 2019-07-16 DIAGNOSIS — F514 Sleep terrors [night terrors]: Secondary | ICD-10-CM

## 2019-07-16 DIAGNOSIS — M810 Age-related osteoporosis without current pathological fracture: Secondary | ICD-10-CM

## 2019-07-16 DIAGNOSIS — M353 Polymyalgia rheumatica: Secondary | ICD-10-CM

## 2019-07-16 DIAGNOSIS — M19072 Primary osteoarthritis, left ankle and foot: Secondary | ICD-10-CM

## 2019-07-16 DIAGNOSIS — Z8679 Personal history of other diseases of the circulatory system: Secondary | ICD-10-CM

## 2019-07-16 DIAGNOSIS — Q283 Other malformations of cerebral vessels: Secondary | ICD-10-CM

## 2019-07-16 DIAGNOSIS — G44009 Cluster headache syndrome, unspecified, not intractable: Secondary | ICD-10-CM

## 2019-07-16 DIAGNOSIS — M19042 Primary osteoarthritis, left hand: Secondary | ICD-10-CM

## 2019-07-16 DIAGNOSIS — G4489 Other headache syndrome: Secondary | ICD-10-CM

## 2019-07-16 DIAGNOSIS — M65321 Trigger finger, right index finger: Secondary | ICD-10-CM

## 2019-07-16 DIAGNOSIS — M19041 Primary osteoarthritis, right hand: Secondary | ICD-10-CM

## 2019-07-16 DIAGNOSIS — Q282 Arteriovenous malformation of cerebral vessels: Secondary | ICD-10-CM

## 2019-07-16 DIAGNOSIS — Z862 Personal history of diseases of the blood and blood-forming organs and certain disorders involving the immune mechanism: Secondary | ICD-10-CM

## 2019-07-16 NOTE — Telephone Encounter (Signed)
Patient calling because right temple is hurting again. Per patient SED rate dropped last draw. Patient calling to discuss this. Please call to discuss.

## 2019-07-16 NOTE — Telephone Encounter (Signed)
Patient scheduled 07/16/2019 as URGENT

## 2019-07-16 NOTE — Progress Notes (Signed)
Office Visit Note  Patient: Gloria Anderson             Date of Birth: Oct 22, 1933           MRN: [id number]             PCP: [name], MD Referring: [name], MD Visit Date: 07/16/2019 Occupation: @GUAROCC @  Subjective:  Headache.   History of Present Illness: Gloria Anderson is a 86 y.o. female with history of polymyalgia rheumatica and osteoarthritis.  She was seen on an urgent basis today.  She states she had a similar episode about a month ago when she was here.  She was having discomfort on the both sides of her head which she describes over the parietal region which lingered on for few weeks and then resolved.  She states last night while she was watching TV she started hurting on the right side and it was throbbing.  The throbbing lasted for couple of hours until she was watching TV then it resolved.  She states when she touches that area is tender for her.  She denies any increased muscle weakness or tenderness.  She continues to have a stiffness in her hands.  She also has right trigger index finger and left trigger ring finger.  She has been seeing Dr. Brett Fairy for night terrors and sleepwalking.  She states she was recently seen by an ophthalmologist less than a month ago for dry eyes.  Her eye examination was normal.  Activities of Daily Living:  Patient reports morning stiffness for 0 minutes.   Patient Denies nocturnal pain.  Difficulty dressing/grooming: Denies Difficulty climbing stairs: Denies Difficulty getting out of chair: Reports Difficulty using hands for taps, buttons, cutlery, and/or writing: Reports  Review of Systems  Constitutional: Negative for fatigue, night sweats, weight gain and weight loss.  HENT: Positive for mouth dryness. Negative for mouth sores, trouble swallowing, trouble swallowing and nose dryness.   Eyes: Positive for dryness. Negative for pain, redness and visual disturbance.  Respiratory: Negative for cough, shortness of  breath and difficulty breathing.   Cardiovascular: Negative for chest pain, palpitations, hypertension, irregular heartbeat and swelling in legs/feet.  Gastrointestinal: Positive for constipation. Negative for blood in stool and diarrhea.  Endocrine: Negative for increased urination.  Genitourinary: Negative for difficulty urinating and vaginal dryness.  Musculoskeletal: Positive for arthralgias and joint pain. Negative for joint swelling, myalgias, muscle weakness, morning stiffness, muscle tenderness and myalgias.  Skin: Negative for color change, rash, hair loss, redness, skin tightness, ulcers and sensitivity to sunlight.  Allergic/Immunologic: Negative for susceptible to infections.  Neurological: Positive for weakness. Negative for dizziness, numbness, headaches, memory loss and night sweats.  Hematological: Negative for bruising/bleeding tendency and swollen glands.  Psychiatric/Behavioral: Negative for depressed mood, confusion and sleep disturbance. The patient is not nervous/anxious.     PMFS History:  Patient Active Problem List   Diagnosis Date Noted  . Sleep walking 05/23/2019  . Cognitive complaints with normal exam 05/23/2019  . Cerebral arteriovenous malformation (AVM) 05/23/2019  . 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 Nocturnal seizures ([place]) 05/23/2019  . Adult night terrors 01/09/2019  . Degenerative lumbar spinal stenosis 08/15/2018  . Memory difficulty 07/26/2017  . Other parasomnia 07/26/2017  . Anemia 06/02/2017  . Osteopenia 06/02/2017  . Spinal stenosis 06/02/2017  . Iron deficiency anemia secondary to inadequate dietary iron intake 08/05/2016  . Fatigue associated with anemia 08/05/2016  . Primary osteoarthritis of both feet 02/04/2016  . Essential  hypertension 02/04/2016  . PMR (polymyalgia rheumatica) (HCC) 02/03/2016  . Osteoarthritis, hand 02/03/2016  . Age-related osteoporosis without current pathological fracture  02/03/2016  . DDD (degenerative disc disease), lumbar 02/03/2016  . High risk medication use 02/03/2016  . 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 Cerebral cavernous malformation type 1 12/04/2013  . Night terrors, adult 09/26/2013  . HEMORRHOIDS-EXTERNAL 09/19/2009  . GERD 09/19/2009  . CONSTIPATION 09/19/2009  . DYSPHAGIA 09/19/2009  . PERSONAL HISTORY OF COLONIC POLYPS 09/19/2009    Past Medical History:  Diagnosis Date  . Arthritis   . Complication of anesthesia   . Constipation   . Depression   . GERD (gastroesophageal reflux disease)   . Hypertension   . Hypothyroidism   . Osteoporosis   . Pneumonia 2018  . Polymyalgia ([employer])   . Polymyalgia rheumatica ([place])   . PONV (postoperative nausea and vomiting)     Family History  Problem Relation Age of Onset  . CVA Father   . Tuberculosis Mother   . Lung cancer Brother    Past Surgical History:  Procedure Laterality Date  . COLONOSCOPY W/ POLYPECTOMY    . EYE SURGERY     both cataracts  . LUMBAR LAMINECTOMY/DECOMPRESSION MICRODISCECTOMY N/A 08/15/2018   Procedure: Lumbar three to Lumbar five Decompressive lumbar laminectomy;  Surgeon: Erline Levine, MD;  Location: [place];  Service: Neurosurgery;  Laterality: N/A;  . NASAL SINUS SURGERY    . SHOULDER SURGERY Right 2007   repair  . TONSILLECTOMY    . TRIGGER FINGER RELEASE  03/19/2011   Procedure:  left middle finger RELEASE TRIGGER FINGER/A-1 PULLEY;  Surgeon: Cammie Sickle., MD;  Location: [place];  Service: Orthopedics;  Laterality: Right;  Procedure:  Release Right Long and Ring Trigger Fingers, Release Left Long Trigger Finger, Injection Left Long Proximal Phalangeal Joint  . TRIGGER FINGER RELEASE Right    Ring finger, middle finger  . UPPER GASTROINTESTINAL ENDOSCOPY     Social History   Social History Narrative   Patient is widowed.   Patient has two children.   Patient does not drink any  caffeine.    Patient has a high school education.   Patient is right-handed.            Immunization History  Administered Date(s) Administered  . Influenza, High Dose Seasonal PF 12/19/2016  . PFIZER SARS-COV-2 Vaccination 03/24/2019, 04/13/2019  . Zoster Recombinat (Shingrix) 08/01/2017, 10/10/2017     Objective: Vital Signs: BP (!) 142/78 (BP Location: Left Arm, Patient Position: Standing, Cuff Size: Normal)   Pulse 79   Resp 13   Ht 5\' 4"  (1.626 m)   Wt 121 lb (54.9 kg)   BMI 20.77 kg/m    Physical Exam Vitals and nursing note reviewed.  Constitutional:      Appearance: She is well-developed.  HENT:     Head: Normocephalic and atraumatic.  Eyes:     Conjunctiva/sclera: Conjunctivae normal.  Cardiovascular:     Rate and Rhythm: Normal rate and regular rhythm.     Heart sounds: Normal heart sounds.  Pulmonary:     Effort: Pulmonary effort is normal.     Breath sounds: Normal breath sounds.  Abdominal:     General: Bowel sounds are normal.     Palpations: Abdomen is soft.  Musculoskeletal:     Cervical back: Normal range of motion.  Lymphadenopathy:     Cervical: No cervical adenopathy.  Skin:  General: Skin is warm and dry.     Capillary Refill: Capillary refill takes less than 2 seconds.     Comments: Patient appears to have a SK lesion over the right temporal region.  She has an appointment coming up with a dermatologist.  Neurological:     Mental Status: She is alert and oriented to person, place, and time.  Psychiatric:        Behavior: Behavior normal.      Musculoskeletal Exam: C-spine is some limitation range of motion.  Shoulder joints, elbow joints, wrist joints range of motion.  She has bilateral PIP and DIP thickening.  Trigger fingers.  Hip joints and knee joints negative range of motion.  She had no muscular weakness or tenderness.  CDAI Exam: CDAI Score: -- Patient Global: --; Provider Global: -- Swollen: --; Tender: -- Joint Exam  07/16/2019   No joint exam has been documented for this visit   There is currently no information documented on the homunculus. Go to the Rheumatology activity and complete the homunculus joint exam.  Investigation: No additional findings.  Imaging: PCV CAROTID DUPLEX (BILATERAL)  Result Date: 07/08/2019 Carotid artery duplex  07/04/2019: No hemodynamically significant arterial disease in the internal carotid artery bilaterally. Antegrade right vertebral artery flow. Antegrade left vertebral artery flow.   Recent Labs: Lab Results  Component Value Date   WBC 5.3 08/11/2018   HGB 11.6 (L) 08/11/2018   PLT 183 08/11/2018   NA 140 08/11/2018   K 4.6 08/11/2018   CL 105 08/11/2018   CO2 25 08/11/2018   GLUCOSE 110 (H) 08/11/2018   BUN 22 08/11/2018   CREATININE 1.26 (H) 08/11/2018   BILITOT 0.9 03/23/2018   AST 34 03/23/2018   ALT 28 03/23/2018   PROT 6.8 03/23/2018   CALCIUM 9.5 08/11/2018   GFRAA 46 (L) 08/11/2018    Speciality Comments: PLQ eye exam: 10/16/2018 normal. Dr. Gershon Crane. Follow up in 1 year.  Procedures:  No procedures performed Allergies: Patient has no known allergies.   Assessment / Plan:     Visit Diagnoses: PMR (polymyalgia rheumatica) (HCC)-in remission.  She had no muscular weakness or tenderness.  She has been on Plaquenil 200 mg p.o. daily.  Elevated sed rate - Her sed rate in January 2021 was 75 and April 2021 was 5.  Other headache syndrome-patient had an episode of throbbing headache about a month ago which resolved after couple of weeks later.  She states the symptoms recurred last night while she was watching television and lasted for couple of hours while she was watching TV and then resolved.  She is slightly tender in that area which she points to the right parietal region.  She had no temporal artery tenderness.  I called Dr. Brett Fairy and discussed the current situation.  She recommended getting sed rate and CRP.  She will also schedule MRA  and MRI at her office.  She will follow up with the patient regarding the results.  She was in agreement to hold off temporal artery biopsy at this time.  High risk medication use - Plaquenil 200 mg p.o. daily  Primary osteoarthritis of both hands-she continues to have some discomfort in her hands.  Primary osteoarthritis of both feet  DDD (degenerative disc disease), cervical-she has limited range of motion and some stiffness.  DDD (degenerative disc disease), lumbar-currently not in much discomfort.  Chronic left shoulder pain  History of repair of right rotator cuff  Trigger index finger of right hand-patient had  very good response to injections in the past.  She will need surgery in the future.  Trigger ring finger of left hand  Age-related osteoporosis without current pathological fracture-she is on Prolia injections.  History of hypertension-blood pressure is mildly elevated.  History of gastroesophageal reflux (GERD)  History of anemia  Night terrors, adult and sleepwalking-she has been followed by Dr. Brett Fairy.  Orders: Orders Placed This Encounter  Procedures  . Sedimentation rate  . C-reactive protein   No orders of the defined types were placed in this encounter.   Face-to-face time spent with patient was 30 minutes. Greater than 50% of time was spent in counseling and coordination of care.  Follow-Up Instructions: Return for PMR, HAs.   Bo Merino, MD  Note - This record has been created using Editor, commissioning.  Chart creation errors have been sought, but may not always  have been located. Such creation errors do not reflect on  the standard of medical care.

## 2019-07-16 NOTE — Telephone Encounter (Signed)
Please schedule an urgent appointment today for further evaluation.

## 2019-07-16 NOTE — Telephone Encounter (Signed)
Patient states she is having tenderness in her right temple. Patient states it last night she states it was throbbing on it's own yesterday evening. Patient states the right temple is tender to the touch. Patient states she is also having itching of her scalp which she has contacted the dermatologist.  Please advise.

## 2019-07-17 ENCOUNTER — Telehealth: Payer: Self-pay | Admitting: Neurology

## 2019-07-17 DIAGNOSIS — R419 Unspecified symptoms and signs involving cognitive functions and awareness: Secondary | ICD-10-CM

## 2019-07-17 DIAGNOSIS — Q283 Other malformations of cerebral vessels: Secondary | ICD-10-CM

## 2019-07-17 DIAGNOSIS — R700 Elevated erythrocyte sedimentation rate: Secondary | ICD-10-CM

## 2019-07-17 LAB — C-REACTIVE PROTEIN: CRP: 1.3 mg/L (ref <8.0)

## 2019-07-17 LAB — SEDIMENTATION RATE: Sed Rate: 17 mm/h (ref 0–30)

## 2019-07-17 NOTE — Progress Notes (Signed)
Please notify patient that her sed rate and CRP are normal.

## 2019-07-17 NOTE — Telephone Encounter (Signed)
Order corrected for the patient.

## 2019-07-17 NOTE — Progress Notes (Signed)
This sed rate is much lower than it used to be-normal c reactive protein as well. Not likely temporal arteritis as a a reason for headaches.  I have ordered MRI and MRA brain

## 2019-07-17 NOTE — Telephone Encounter (Signed)
When you get a chance can you switch the MRI Brain to a MRI Brain w/wo contrast or MR Brain wo contrast? Thank you!!

## 2019-07-20 DIAGNOSIS — L218 Other seborrheic dermatitis: Secondary | ICD-10-CM | POA: Diagnosis not present

## 2019-07-20 DIAGNOSIS — I10 Essential (primary) hypertension: Secondary | ICD-10-CM | POA: Diagnosis not present

## 2019-07-24 ENCOUNTER — Telehealth: Payer: Self-pay | Admitting: Neurology

## 2019-07-24 ENCOUNTER — Telehealth: Payer: Self-pay | Admitting: Rheumatology

## 2019-07-24 NOTE — Telephone Encounter (Signed)
Pt called requesting a CB from RN to discuss why she needs an MRI

## 2019-07-24 NOTE — Telephone Encounter (Signed)
Called the patient back and explained the reasoning was to assess if there was worsening with AVM's Pt verbalized understanding.

## 2019-07-24 NOTE — Telephone Encounter (Signed)
FYI: Patient calling in reference to her dermatology appointment with Dr. Allyn Kenner. She saw him Friday 07/20/19 and everything was find with the tender spot over her ear.  Patient would like a call back with her SED rate. She cannot remember what it was last time.

## 2019-07-24 NOTE — Telephone Encounter (Signed)
Spoke with patient and advised her that her sed rate on 07/16/2019 was 17.

## 2019-07-27 DIAGNOSIS — I10 Essential (primary) hypertension: Secondary | ICD-10-CM | POA: Diagnosis not present

## 2019-08-14 ENCOUNTER — Ambulatory Visit
Admission: RE | Admit: 2019-08-14 | Discharge: 2019-08-14 | Disposition: A | Discharge status: Home / Self Care | Payer: Medicare HMO | Source: Ambulatory Visit | Attending: Neurology | Admitting: Neurology

## 2019-08-14 ENCOUNTER — Other Ambulatory Visit: Payer: Medicare HMO

## 2019-08-14 ENCOUNTER — Other Ambulatory Visit: Payer: Self-pay

## 2019-08-14 DIAGNOSIS — Q282 Arteriovenous malformation of cerebral vessels: Secondary | ICD-10-CM | POA: Diagnosis not present

## 2019-08-14 DIAGNOSIS — G44009 Cluster headache syndrome, unspecified, not intractable: Secondary | ICD-10-CM | POA: Diagnosis not present

## 2019-08-14 DIAGNOSIS — R419 Unspecified symptoms and signs involving cognitive functions and awareness: Secondary | ICD-10-CM

## 2019-08-14 DIAGNOSIS — Q283 Other malformations of cerebral vessels: Secondary | ICD-10-CM

## 2019-08-14 DIAGNOSIS — R700 Elevated erythrocyte sedimentation rate: Secondary | ICD-10-CM

## 2019-08-15 ENCOUNTER — Telehealth: Payer: Self-pay | Admitting: Neurology

## 2019-08-15 DIAGNOSIS — R69 Illness, unspecified: Secondary | ICD-10-CM | POA: Diagnosis not present

## 2019-08-15 NOTE — Telephone Encounter (Signed)
Pt is asking if she can take clonazePAM (KLONOPIN) 0.5 MG tablet before her MRI and if so how many hours before and how much.  Please call.

## 2019-08-15 NOTE — Telephone Encounter (Signed)
Called the patient back to advise of the instructions. There was no answer. Left a detailed message advising the patient she should have a driver in the event the medication makes her too sleepy.  Informed to take 1 tablet 30 minutes prior to the scheduled MRI time and she may take an additional tablet if necessary at the time of the appointment.   If patient calls back please reiterate the directions/instructions above.

## 2019-08-16 ENCOUNTER — Other Ambulatory Visit: Payer: Medicare HMO

## 2019-08-16 ENCOUNTER — Ambulatory Visit
Admission: RE | Admit: 2019-08-16 | Discharge: 2019-08-16 | Disposition: A | Discharge status: Home / Self Care | Payer: Medicare HMO | Source: Ambulatory Visit | Attending: Neurology | Admitting: Neurology

## 2019-08-16 ENCOUNTER — Other Ambulatory Visit: Payer: Self-pay

## 2019-08-16 DIAGNOSIS — Q283 Other malformations of cerebral vessels: Secondary | ICD-10-CM

## 2019-08-16 DIAGNOSIS — R700 Elevated erythrocyte sedimentation rate: Secondary | ICD-10-CM

## 2019-08-16 DIAGNOSIS — R419 Unspecified symptoms and signs involving cognitive functions and awareness: Secondary | ICD-10-CM

## 2019-08-16 MED ORDER — GADOBENATE DIMEGLUMINE 529 MG/ML IV SOLN
11.0000 mL | Freq: Once | INTRAVENOUS | Status: AC | PRN
  Administered 2019-08-16: 11 mL via INTRAVENOUS

## 2019-08-16 NOTE — Progress Notes (Signed)
FINDINGS:   This study is of adequate technical quality. Flow signal of the bilateral internal carotid arteries have no stenosis. The bilateral middle and anterior cerebral arteries have no stenosis. The bilateral vertebral, basilar, bilateral posterior cerebral arteries have no stenosis.   No aneurysmal dilatations are seen.  Stable right basal ganglia and left cerebellar cavernous malformations.    IMPRESSION:   Normal MRA head (without).

## 2019-08-17 ENCOUNTER — Encounter: Payer: Self-pay | Admitting: Neurology

## 2019-08-20 NOTE — Progress Notes (Signed)
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 Compared to MRI scan dated 09/17/2013 the cavernous angiomas appear to be stable but there appears to be mild progressive generalized cortical atrophy.  This is normal for age progression given the baseline of vascular malformation.

## 2019-08-27 DIAGNOSIS — R7302 Impaired glucose tolerance (oral): Secondary | ICD-10-CM | POA: Diagnosis not present

## 2019-08-27 DIAGNOSIS — I679 Cerebrovascular disease, unspecified: Secondary | ICD-10-CM | POA: Diagnosis not present

## 2019-08-27 DIAGNOSIS — I340 Nonrheumatic mitral (valve) insufficiency: Secondary | ICD-10-CM | POA: Diagnosis not present

## 2019-08-27 DIAGNOSIS — E785 Hyperlipidemia, unspecified: Secondary | ICD-10-CM | POA: Diagnosis not present

## 2019-08-27 DIAGNOSIS — M353 Polymyalgia rheumatica: Secondary | ICD-10-CM | POA: Diagnosis not present

## 2019-08-27 DIAGNOSIS — H04123 Dry eye syndrome of bilateral lacrimal glands: Secondary | ICD-10-CM | POA: Diagnosis not present

## 2019-08-27 DIAGNOSIS — H539 Unspecified visual disturbance: Secondary | ICD-10-CM | POA: Diagnosis not present

## 2019-08-27 DIAGNOSIS — N1832 Chronic kidney disease, stage 3b: Secondary | ICD-10-CM | POA: Diagnosis not present

## 2019-08-27 DIAGNOSIS — M810 Age-related osteoporosis without current pathological fracture: Secondary | ICD-10-CM | POA: Diagnosis not present

## 2019-08-27 DIAGNOSIS — I10 Essential (primary) hypertension: Secondary | ICD-10-CM | POA: Diagnosis not present

## 2019-08-29 ENCOUNTER — Telehealth: Payer: Self-pay | Admitting: Rheumatology

## 2019-08-29 DIAGNOSIS — I10 Essential (primary) hypertension: Secondary | ICD-10-CM | POA: Diagnosis not present

## 2019-08-29 DIAGNOSIS — Z961 Presence of intraocular lens: Secondary | ICD-10-CM | POA: Diagnosis not present

## 2019-08-29 DIAGNOSIS — H532 Diplopia: Secondary | ICD-10-CM | POA: Diagnosis not present

## 2019-08-29 DIAGNOSIS — Z79899 Other long term (current) drug therapy: Secondary | ICD-10-CM | POA: Diagnosis not present

## 2019-08-29 NOTE — Telephone Encounter (Signed)
Patient left a voicemail stating she had labwork with her PCP Dr. Forde Dandy.  Patient states they will be faxing a copy of the results to Dr. Estanislado Pandy today 08/29/19.  Patient is requesting a return call after Dr. Estanislado Pandy reviews the results to see if there will be a change in her medication.

## 2019-08-29 NOTE — Telephone Encounter (Signed)
Received lab results from PCP and per Dr. Estanislado Pandy there will be no changes to medication. Attempted to contact the patient and left message to advise.   BMP drawn on 08/27/2019.  Reviewed by Dr. Estanislado Pandy Glucose 113, Sodium 130

## 2019-09-04 ENCOUNTER — Telehealth: Payer: Self-pay | Admitting: Neurology

## 2019-09-04 NOTE — Telephone Encounter (Signed)
Called the patient back. She was concerned that she wasn't understanding the information from the recent MRI images.  Advised that based off of Dr Dohmeier's assessment she felt that in comparison to the 2015 imaging there was no progression of the malformations. They are stable meaning they have not gotten any bigger or worse. Patient is just concerned with all the medical issues that are going on with her. Informed her that Dr Brett Fairy didn't see anything that would be a cause for any new symptoms. Pt has an apt in November, she will keep this apt and call if feels she needs to be seen sooner.

## 2019-09-04 NOTE — Telephone Encounter (Signed)
Pt is aware of MRA result, patient asking for call to discuss with Nurse.

## 2019-09-10 DIAGNOSIS — Z961 Presence of intraocular lens: Secondary | ICD-10-CM | POA: Diagnosis not present

## 2019-09-10 DIAGNOSIS — M353 Polymyalgia rheumatica: Secondary | ICD-10-CM | POA: Diagnosis not present

## 2019-09-10 DIAGNOSIS — H532 Diplopia: Secondary | ICD-10-CM | POA: Diagnosis not present

## 2019-09-10 DIAGNOSIS — H04123 Dry eye syndrome of bilateral lacrimal glands: Secondary | ICD-10-CM | POA: Diagnosis not present

## 2019-09-10 DIAGNOSIS — Z79899 Other long term (current) drug therapy: Secondary | ICD-10-CM | POA: Diagnosis not present

## 2019-09-18 ENCOUNTER — Telehealth: Payer: Self-pay | Admitting: Pharmacist

## 2019-09-18 NOTE — Telephone Encounter (Signed)
Med list reviewed and updated 

## 2019-09-24 ENCOUNTER — Telehealth: Payer: Self-pay | Admitting: Neurology

## 2019-09-24 DIAGNOSIS — F514 Sleep terrors [night terrors]: Secondary | ICD-10-CM

## 2019-09-24 NOTE — Telephone Encounter (Signed)
Called the patient back. She states that she is taking 1 at bedtime. She is asking if we can send a refill for quantity of 90 equaling 3 mth supply to [facility]. Pt is not running low and is ok with waiting until Dr Brett Fairy comes in. She just wanted to send it so they have it on file when she is ready to fill it. Advised we would take care of this for her

## 2019-09-24 NOTE — Telephone Encounter (Signed)
Pt is asking for a call from RN to discuss concerns about how she is taking clonazePAM (KLONOPIN) 0.5 MG tablet

## 2019-09-24 NOTE — Addendum Note (Signed)
Addended by: Darleen Crocker on: 09/24/2019 03:48 PM   Modules accepted: Orders

## 2019-09-25 MED ORDER — CLONAZEPAM 0.5 MG PO TABS: 0.5000 mg | ORAL_TABLET | Freq: Every day | ORAL | 3 refills | Status: DC

## 2019-09-25 NOTE — Telephone Encounter (Signed)
Patient will take a full tablet of 0.5 mg klonopin at bedtime po.

## 2019-09-25 NOTE — Addendum Note (Signed)
Addended by: Larey Seat on: 09/25/2019 12:03 PM   Modules accepted: Orders

## 2019-10-14 ENCOUNTER — Telehealth: Payer: Self-pay | Admitting: Cardiology

## 2019-10-14 DIAGNOSIS — I10 Essential (primary) hypertension: Secondary | ICD-10-CM

## 2019-10-14 NOTE — Telephone Encounter (Signed)
Patient called earlier today, concerned about sitting BP in 160s. Asked her to take hydralazine 25 mg evening at night. Patient will call our office 8/16 morning to follow up.  Time spent: 8 min

## 2019-10-16 DIAGNOSIS — M353 Polymyalgia rheumatica: Secondary | ICD-10-CM | POA: Diagnosis not present

## 2019-10-16 DIAGNOSIS — H04123 Dry eye syndrome of bilateral lacrimal glands: Secondary | ICD-10-CM | POA: Diagnosis not present

## 2019-10-16 DIAGNOSIS — Z961 Presence of intraocular lens: Secondary | ICD-10-CM | POA: Diagnosis not present

## 2019-10-16 DIAGNOSIS — Z79899 Other long term (current) drug therapy: Secondary | ICD-10-CM | POA: Diagnosis not present

## 2019-10-24 DIAGNOSIS — Z01419 Encounter for gynecological examination (general) (routine) without abnormal findings: Secondary | ICD-10-CM | POA: Diagnosis not present

## 2019-10-24 DIAGNOSIS — Z1231 Encounter for screening mammogram for malignant neoplasm of breast: Secondary | ICD-10-CM | POA: Diagnosis not present

## 2019-10-29 ENCOUNTER — Telehealth: Payer: Self-pay | Admitting: Rheumatology

## 2019-10-29 MED ORDER — HYDROXYCHLOROQUINE SULFATE 200 MG PO TABS: 200.0000 mg | ORAL_TABLET | Freq: Every day | ORAL | 0 refills | Status: DC

## 2019-10-29 NOTE — Telephone Encounter (Signed)
Last Visit: 07/16/2019 Next Visit: 11/28/2019 Labs: 08/27/2019 Glucose 113, Sodium 130 Eye exam: 10/16/2019 normal.  Current Dose per office note 07/16/2019: Plaquenil 200 mg p.o. daily DX: PMR  Okay to refill per Dr. Estanislado Pandy

## 2019-10-29 NOTE — Telephone Encounter (Signed)
Patient called requesting prescription refill of Hydroxychloroquine to be sent to [facility].  Patient states she only has 8 pills remaining.

## 2019-11-14 DIAGNOSIS — R69 Illness, unspecified: Secondary | ICD-10-CM | POA: Diagnosis not present

## 2019-11-19 NOTE — Progress Notes (Signed)
Office Visit Note  Patient: Gloria Anderson             Date of Birth: Mar 10, 1933           MRN: [id number]             PCP: [name], MD Referring: [name], MD Visit Date: 11/28/2019 Occupation: @GUAROCC @  Subjective:  Memory issues, muscle weakness   History of Present Illness: Gloria Anderson is a 86 y.o. female with history of polymyalgia, osteoarthritis and osteoporosis.  She states that she has been experiencing increased issues with her memory.  She has appointment coming up with Dr. Brett Fairy.  She wanted me to review MRI and MRA findings.  She states that she feels weaker.  She denies any joint pain or muscle pain.  Activities of Daily Living:  Patient reports morning stiffness for 0 minutes.   Patient Denies nocturnal pain.  Difficulty dressing/grooming: Reports Difficulty climbing stairs: Denies Difficulty getting out of chair: Reports Difficulty using hands for taps, buttons, cutlery, and/or writing: Reports  Review of Systems  Constitutional: Positive for fatigue.  HENT: Positive for mouth dryness. Negative for mouth sores and nose dryness.   Eyes: Negative for pain, visual disturbance and dryness.  Respiratory: Negative for cough, hemoptysis, shortness of breath and difficulty breathing.   Cardiovascular: Negative for chest pain, palpitations and swelling in legs/feet.  Gastrointestinal: Positive for constipation. Negative for abdominal pain, blood in stool and diarrhea.  Endocrine: Negative for increased urination.  Genitourinary: Negative for painful urination.  Musculoskeletal: Positive for arthralgias, joint pain and joint swelling. Negative for myalgias, muscle weakness, morning stiffness, muscle tenderness and myalgias.  Skin: Negative for color change, rash and redness.  Allergic/Immunologic: Negative for susceptible to infections.  Neurological: Positive for dizziness, headaches, memory loss and weakness.  Hematological: Negative for  swollen glands.  Psychiatric/Behavioral: Positive for depressed mood. Negative for confusion and sleep disturbance. The patient is nervous/anxious.     PMFS History:  Patient Active Problem List   Diagnosis Date Noted  . Sleep walking 05/23/2019  . Cognitive complaints with normal exam 05/23/2019  . Cerebral arteriovenous malformation (AVM) 05/23/2019  . 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 Nocturnal seizures ([place]) 05/23/2019  . Adult night terrors 01/09/2019  . Degenerative lumbar spinal stenosis 08/15/2018  . Memory difficulty 07/26/2017  . Other parasomnia 07/26/2017  . Anemia 06/02/2017  . Osteopenia 06/02/2017  . Spinal stenosis 06/02/2017  . Iron deficiency anemia secondary to inadequate dietary iron intake 08/05/2016  . Fatigue associated with anemia 08/05/2016  . Primary osteoarthritis of both feet 02/04/2016  . Essential hypertension 02/04/2016  . PMR (polymyalgia rheumatica) (HCC) 02/03/2016  . Osteoarthritis, hand 02/03/2016  . Age-related osteoporosis without current pathological fracture 02/03/2016  . DDD (degenerative disc disease), lumbar 02/03/2016  . High risk medication use 02/03/2016  . 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 Cerebral cavernous malformation type 1 12/04/2013  . Night terrors, adult 09/26/2013  . HEMORRHOIDS-EXTERNAL 09/19/2009  . GERD 09/19/2009  . CONSTIPATION 09/19/2009  . DYSPHAGIA 09/19/2009  . PERSONAL HISTORY OF COLONIC POLYPS 09/19/2009    Past Medical History:  Diagnosis Date  . Arthritis   . Complication of anesthesia   . Constipation   . Depression   . GERD (gastroesophageal reflux disease)   . Hypertension   . Hypothyroidism   . Osteoporosis   . Pneumonia 2018  . Polymyalgia ([place])   . Polymyalgia rheumatica ([place])   .  PONV (postoperative nausea and vomiting)     Family History  Problem Relation Age of Onset  . CVA Father   . Tuberculosis  Mother   . Lung cancer Brother    Past Surgical History:  Procedure Laterality Date  . COLONOSCOPY W/ POLYPECTOMY    . EYE SURGERY     both cataracts  . LUMBAR LAMINECTOMY/DECOMPRESSION MICRODISCECTOMY N/A 08/15/2018   Procedure: Lumbar three to Lumbar five Decompressive lumbar laminectomy;  Surgeon: Erline Levine, MD;  Location: [facility];  Service: Neurosurgery;  Laterality: N/A;  . NASAL SINUS SURGERY    . SHOULDER SURGERY Right 2007   repair  . TONSILLECTOMY    . TRIGGER FINGER RELEASE  03/19/2011   Procedure:  left middle finger RELEASE TRIGGER FINGER/A-1 PULLEY;  Surgeon: Cammie Sickle., MD;  Location: [facility];  Service: Orthopedics;  Laterality: Right;  Procedure:  Release Right Long and Ring Trigger Fingers, Release Left Long Trigger Finger, Injection Left Long Proximal Phalangeal Joint  . TRIGGER FINGER RELEASE Right    Ring finger, middle finger  . UPPER GASTROINTESTINAL ENDOSCOPY     Social History   Social History Narrative   Patient is widowed.   Patient has two children.   Patient does not drink any caffeine.    Patient has a high school education.   Patient is right-handed.            Immunization History  Administered Date(s) Administered  . Influenza, High Dose Seasonal PF 12/19/2016  . PFIZER SARS-COV-2 Vaccination 03/24/2019, 04/13/2019  . Zoster Recombinat (Shingrix) 08/01/2017, 10/10/2017     Objective: Vital Signs: BP 139/72 (BP Location: Right Arm, Patient Position: Standing, Cuff Size: Small)   Pulse 72   Ht $R'5\' 4"'ga$  (1.626 m)   Wt 119 lb (54 kg)   BMI 20.43 kg/m    Physical Exam Vitals and nursing note reviewed.  Constitutional:      Appearance: She is well-developed.  HENT:     Head: Normocephalic and atraumatic.  Eyes:     Conjunctiva/sclera: Conjunctivae normal.  Cardiovascular:     Rate and Rhythm: Normal rate and regular rhythm.     Heart sounds: Normal heart sounds.  Pulmonary:     Effort: Pulmonary effort is  normal.     Breath sounds: Normal breath sounds.  Abdominal:     General: Bowel sounds are normal.     Palpations: Abdomen is soft.  Musculoskeletal:     Cervical back: Normal range of motion.  Lymphadenopathy:     Cervical: No cervical adenopathy.  Skin:    General: Skin is warm and dry.     Capillary Refill: Capillary refill takes less than 2 seconds.  Neurological:     Mental Status: She is alert and oriented to person, place, and time.  Psychiatric:        Behavior: Behavior normal.      Musculoskeletal Exam: She has limited range of motion of her cervical spine without discomfort.  She had no discomfort in her lumbar spine.  Left shoulder joint abduction was limited to 90 degrees.  Right shoulder joint was in good range of motion.  Elbow joints with good range of motion.  She has bilateral CMC PIP and DIP thickening with no synovitis.  Hip joints and knee joints with good range of motion.  No muscular weakness or tenderness was noted. CDAI Exam: CDAI Score: -- Patient Global: --; Provider Global: -- Swollen: --; Tender: -- Joint Exam 11/28/2019  No joint exam has been documented for this visit   There is currently no information documented on the homunculus. Go to the Rheumatology activity and complete the homunculus joint exam.  Investigation: No additional findings.  Imaging: No results found.  Recent Labs: Lab Results  Component Value Date   WBC 5.3 08/11/2018   HGB 11.6 (L) 08/11/2018   PLT 183 08/11/2018   NA 140 08/11/2018   K 4.6 08/11/2018   CL 105 08/11/2018   CO2 25 08/11/2018   GLUCOSE 110 (H) 08/11/2018   BUN 22 08/11/2018   CREATININE 1.26 (H) 08/11/2018   BILITOT 0.9 03/23/2018   AST 34 03/23/2018   ALT 28 03/23/2018   PROT 6.8 03/23/2018   CALCIUM 9.5 08/11/2018   GFRAA 46 (L) 08/11/2018   June 2021 labs from her PCP showed CMP normal, ESR 47  Speciality Comments: PLQ eye exam: 10/16/2019 normal. Dr. Gershon Crane. Follow up in 1  year.  Procedures:  No procedures performed Allergies: Patient has no known allergies.   Assessment / Plan:     Visit Diagnoses: PMR (polymyalgia rheumatica) (HCC) - in remission. -She has been on Plaquenil which she is tolerating well.  I do not see any increased muscle weakness or tenderness.  Although she believes that her muscles are not as strong as before.  I will refer her to physical therapy for lower extremity muscle strengthening and fall prevention.  Plan: Ambulatory referral to Physical Therapy  Elevated sed rate-she has chronic elevation of sedimentation rate.  High risk medication use - Plaquenil 200 mg p.o. daily. PLQ eye exam: 10/16/2019 - Plan: CBC with Differential/Platelet, COMPLETE METABOLIC PANEL WITH GFR today.  Chronic left shoulder pain-she has limited abduction.  History of repair of right rotator cuff-she had good range of motion.  Primary osteoarthritis of both hands-joint protection muscle strengthening was discussed.  Trigger index finger of right hand-improved.  She has intermittent symptoms.  Trigger ring finger of left hand-she has intermittent symptoms.  Primary osteoarthritis of both feet-currently not having any discomfort.  DDD (degenerative disc disease), cervical-she has limited range of motion with no discomfort.  DDD (degenerative disc disease), lumbar-she has been doing better since the surgery.  Age-related osteoporosis without current pathological fracture - she is on Prolia injections.  Last injection was June 14, 2019.- Plan: VITAMIN D 25 Hydroxy (Vit-D Deficiency, Fractures)  Other headache syndrome-she has been experiencing some memory loss.  She has appointment coming up with Dr. Brett Fairy.  Per her request I explained the findings of MRI and MRI.  History of gastroesophageal reflux (GERD)  History of hypertension-her blood pressure is controlled.  Night terrors, adult - and sleepwalking-she has been followed by Dr.  Brett Fairy.  Educated about COVID-19 virus infection-have advised her to get a booster vaccine.  Use of mask, social distancing and hand hygiene was discussed.  I also explained the use of monoclonal antibodies infusion in case she develops COVID-19 infection.  Handout was placed on the AVS for her review.  Orders: Orders Placed This Encounter  Procedures  . CBC with Differential/Platelet  . COMPLETE METABOLIC PANEL WITH GFR  . VITAMIN D 25 Hydroxy (Vit-D Deficiency, Fractures)  . Ambulatory referral to Physical Therapy   No orders of the defined types were placed in this encounter.    Follow-Up Instructions: Return in about 5 months (around 04/28/2020) for PMR, OA.   Bo Merino, MD  Note - This record has been created using Editor, commissioning.  Chart creation errors have been  sought, but may not always  have been located. Such creation errors do not reflect on  the standard of medical care.

## 2019-11-28 ENCOUNTER — Encounter: Payer: Self-pay | Admitting: Rheumatology

## 2019-11-28 ENCOUNTER — Telehealth: Payer: Self-pay

## 2019-11-28 ENCOUNTER — Other Ambulatory Visit: Payer: Self-pay

## 2019-11-28 ENCOUNTER — Ambulatory Visit (INDEPENDENT_AMBULATORY_CARE_PROVIDER_SITE_OTHER): Payer: Medicare HMO | Admitting: Rheumatology

## 2019-11-28 VITALS — BP 139/72 | HR 72 | Ht 64.0 in | Wt 119.0 lb

## 2019-11-28 DIAGNOSIS — Z8679 Personal history of other diseases of the circulatory system: Secondary | ICD-10-CM

## 2019-11-28 DIAGNOSIS — Z9889 Other specified postprocedural states: Secondary | ICD-10-CM

## 2019-11-28 DIAGNOSIS — M19071 Primary osteoarthritis, right ankle and foot: Secondary | ICD-10-CM

## 2019-11-28 DIAGNOSIS — R700 Elevated erythrocyte sedimentation rate: Secondary | ICD-10-CM | POA: Diagnosis not present

## 2019-11-28 DIAGNOSIS — M810 Age-related osteoporosis without current pathological fracture: Secondary | ICD-10-CM | POA: Diagnosis not present

## 2019-11-28 DIAGNOSIS — M5136 Other intervertebral disc degeneration, lumbar region: Secondary | ICD-10-CM

## 2019-11-28 DIAGNOSIS — M51369 Other intervertebral disc degeneration, lumbar region without mention of lumbar back pain or lower extremity pain: Secondary | ICD-10-CM

## 2019-11-28 DIAGNOSIS — Z7189 Other specified counseling: Secondary | ICD-10-CM

## 2019-11-28 DIAGNOSIS — M19041 Primary osteoarthritis, right hand: Secondary | ICD-10-CM | POA: Diagnosis not present

## 2019-11-28 DIAGNOSIS — M19072 Primary osteoarthritis, left ankle and foot: Secondary | ICD-10-CM

## 2019-11-28 DIAGNOSIS — M19042 Primary osteoarthritis, left hand: Secondary | ICD-10-CM

## 2019-11-28 DIAGNOSIS — F514 Sleep terrors [night terrors]: Secondary | ICD-10-CM

## 2019-11-28 DIAGNOSIS — M5030 Other cervical disc degeneration, unspecified cervical region: Secondary | ICD-10-CM | POA: Diagnosis not present

## 2019-11-28 DIAGNOSIS — Z79899 Other long term (current) drug therapy: Secondary | ICD-10-CM | POA: Diagnosis not present

## 2019-11-28 DIAGNOSIS — Z8719 Personal history of other diseases of the digestive system: Secondary | ICD-10-CM

## 2019-11-28 DIAGNOSIS — M25512 Pain in left shoulder: Secondary | ICD-10-CM | POA: Diagnosis not present

## 2019-11-28 DIAGNOSIS — M65321 Trigger finger, right index finger: Secondary | ICD-10-CM

## 2019-11-28 DIAGNOSIS — G8929 Other chronic pain: Secondary | ICD-10-CM

## 2019-11-28 DIAGNOSIS — M65342 Trigger finger, left ring finger: Secondary | ICD-10-CM

## 2019-11-28 DIAGNOSIS — M353 Polymyalgia rheumatica: Secondary | ICD-10-CM

## 2019-11-28 DIAGNOSIS — G4489 Other headache syndrome: Secondary | ICD-10-CM

## 2019-11-28 NOTE — Telephone Encounter (Signed)
Patient is due for prolia injection on 12/14/2019.  Is this approved so order can be placed? Thanks!   Patient had labs drawn today in the office.

## 2019-11-28 NOTE — Patient Instructions (Addendum)
COVID-19 vaccine recommendations:   COVID-19 vaccine is recommended for everyone (unless you are allergic to a vaccine component), even if you are on a medication that suppresses your immune system.   Do not take Tylenol or any anti-inflammatory medications (NSAIDs) 24 hours prior to the COVID-19 vaccination.   There is no direct evidence about the efficacy of the COVID-19 vaccine in individuals who are on medications that suppress the immune system.   Even if you are fully vaccinated, and you are on any medications that suppress your immune system, please continue to wear a mask, maintain at least six feet social distance and practice hand hygiene.   If you develop a COVID-19 infection, please contact your PCP or our office to determine if you need monoclonal antibody infusion.  The booster vaccine is now available for immunocompromised patients. It is advised that if you had Pfizer vaccine you should get [employer] booster.  If you had a Moderna vaccine then you should get a Moderna booster. Johnson and Wynetta Emery does not have a booster vaccine at this time.  Please see the following web sites for updated information.   [email or web address]://[email or web address]/[email or web address]-Vaccination-Patient-Resources.[email or web address]  [email or web address]://[email or web address]/About-Us/[email or web address]-Releases/ID/1159

## 2019-11-29 ENCOUNTER — Other Ambulatory Visit: Payer: Self-pay | Admitting: Rheumatology

## 2019-11-29 ENCOUNTER — Other Ambulatory Visit: Payer: Self-pay | Admitting: Physician Assistant

## 2019-11-29 DIAGNOSIS — M810 Age-related osteoporosis without current pathological fracture: Secondary | ICD-10-CM

## 2019-11-29 LAB — COMPLETE METABOLIC PANEL WITH GFR
AG Ratio: 1.9 (calc) (ref 1.0–2.5)
ALT: 17 U/L (ref 6–29)
AST: 24 U/L (ref 10–35)
Albumin: 4.5 g/dL (ref 3.6–5.1)
Alkaline phosphatase (APISO): 41 U/L (ref 37–153)
BUN/Creatinine Ratio: 17 (calc) (ref 6–22)
BUN: 21 mg/dL (ref 7–25)
CO2: 29 mmol/L (ref 20–32)
Calcium: 10 mg/dL (ref 8.6–10.4)
Chloride: 102 mmol/L (ref 98–110)
Creat: 1.24 mg/dL — ABNORMAL HIGH (ref 0.60–0.88)
GFR, Est African American: 47 mL/min/{1.73_m2} — ABNORMAL LOW (ref >=60)
GFR, Est Non African American: 40 mL/min/{1.73_m2} — ABNORMAL LOW (ref >=60)
Globulin: 2.4 g/dL (calc) (ref 1.9–3.7)
Glucose, Bld: 86 mg/dL (ref 65–99)
Potassium: 4.3 mmol/L (ref 3.5–5.3)
Sodium: 139 mmol/L (ref 135–146)
Total Bilirubin: 1.4 mg/dL — ABNORMAL HIGH (ref 0.2–1.2)
Total Protein: 6.9 g/dL (ref 6.1–8.1)

## 2019-11-29 LAB — CBC WITH DIFFERENTIAL/PLATELET
Absolute Monocytes: 398 cells/uL (ref 200–950)
Basophils Absolute: 38 cells/uL (ref 0–200)
Basophils Relative: 0.8 %
Eosinophils Absolute: 91 cells/uL (ref 15–500)
Eosinophils Relative: 1.9 %
HCT: 32.3 % — ABNORMAL LOW (ref 35.0–45.0)
Hemoglobin: 11.1 g/dL — ABNORMAL LOW (ref 11.7–15.5)
Lymphs Abs: 1138 cells/uL (ref 850–3900)
MCH: 34.4 pg — ABNORMAL HIGH (ref 27.0–33.0)
MCHC: 34.4 g/dL (ref 32.0–36.0)
MCV: 100 fL (ref 80.0–100.0)
MPV: 10.3 fL (ref 7.5–12.5)
Monocytes Relative: 8.3 %
Neutro Abs: 3134 cells/uL (ref 1500–7800)
Neutrophils Relative %: 65.3 %
Platelets: 179 10*3/uL (ref 140–400)
RBC: 3.23 10*6/uL — ABNORMAL LOW (ref 3.80–5.10)
RDW: 12.1 % (ref 11.0–15.0)
Total Lymphocyte: 23.7 %
WBC: 4.8 10*3/uL (ref 3.8–10.8)

## 2019-11-29 LAB — VITAMIN D 25 HYDROXY (VIT D DEFICIENCY, FRACTURES): Vit D, 25-Hydroxy: 77 ng/mL (ref 30–100)

## 2019-11-29 NOTE — Telephone Encounter (Signed)
Order placed for Prolia.  Please notify patient.

## 2019-11-29 NOTE — Telephone Encounter (Signed)
Please place prolia order for the hospital.   After order is placed, we will need to advise patient to schedule at Medical Day.

## 2019-11-29 NOTE — Telephone Encounter (Signed)
[facility] and submitted PA for [id number]. Authorization is approved for medical billing through 11/29/19- 11/28/20. Patient will need to receive at the hospital through buy and bill.  Auth# [id number] Phone# [phone]

## 2019-11-29 NOTE — Progress Notes (Signed)
Anemia is a stable.  GFR is still low but is stable.  Vitamin D is normal.  We can refer patient to nephrology.  Please discussed with patient.

## 2019-11-29 NOTE — Progress Notes (Signed)
Order placed for Prolia 60 mg subcu x1 November 28, 2019 CBC showed hemoglobin 11.1, creatinine 1.24, GFR 47. Follow-up appointment May 01, 2020 Bo Merino, MD

## 2019-11-30 NOTE — Telephone Encounter (Signed)
Orders were canceled.

## 2019-11-30 NOTE — Telephone Encounter (Signed)
Patient states Dr. Forde Dandy is the one who usually manages her Prolia. Patient states Dr. Forde Dandy will place orders when she is ready to have the injection. Patient recently had the Covid booster and wanted to wait prior to getting Prolia. Patient will contact Dr. Forde Dandy when she is ready to schedule.

## 2019-12-04 ENCOUNTER — Other Ambulatory Visit: Payer: Self-pay | Admitting: *Deleted

## 2019-12-04 DIAGNOSIS — N189 Chronic kidney disease, unspecified: Secondary | ICD-10-CM

## 2019-12-07 DIAGNOSIS — N1832 Chronic kidney disease, stage 3b: Secondary | ICD-10-CM | POA: Diagnosis not present

## 2019-12-10 ENCOUNTER — Telehealth: Payer: Self-pay | Admitting: Rheumatology

## 2019-12-10 NOTE — Telephone Encounter (Signed)
[facility] received referral and it's under review. I called patient.

## 2019-12-10 NOTE — Telephone Encounter (Signed)
Patient calling to see if she has been referred to [facility]? Patient does want to proceed with referral. Please call to advise.

## 2019-12-18 DIAGNOSIS — H5051 Esophoria: Secondary | ICD-10-CM | POA: Insufficient documentation

## 2019-12-18 DIAGNOSIS — H5053 Vertical heterophoria: Secondary | ICD-10-CM | POA: Diagnosis not present

## 2019-12-18 DIAGNOSIS — H518 Other specified disorders of binocular movement: Secondary | ICD-10-CM | POA: Diagnosis not present

## 2019-12-24 ENCOUNTER — Other Ambulatory Visit: Payer: Self-pay

## 2019-12-24 ENCOUNTER — Encounter: Payer: Self-pay | Admitting: Cardiology

## 2019-12-24 ENCOUNTER — Ambulatory Visit: Payer: Medicare HMO | Admitting: Cardiology

## 2019-12-24 VITALS — BP 130/63 | HR 84 | Resp 16 | Ht 64.0 in | Wt 117.2 lb

## 2019-12-24 DIAGNOSIS — N1832 Chronic kidney disease, stage 3b: Secondary | ICD-10-CM | POA: Diagnosis not present

## 2019-12-24 DIAGNOSIS — I340 Nonrheumatic mitral (valve) insufficiency: Secondary | ICD-10-CM

## 2019-12-24 DIAGNOSIS — I10 Essential (primary) hypertension: Secondary | ICD-10-CM | POA: Diagnosis not present

## 2019-12-24 DIAGNOSIS — I951 Orthostatic hypotension: Secondary | ICD-10-CM

## 2019-12-24 DIAGNOSIS — I071 Rheumatic tricuspid insufficiency: Secondary | ICD-10-CM

## 2019-12-24 NOTE — Progress Notes (Signed)
Primary Physician/Referring:  [name], MD  Patient ID: Gloria Anderson, female    DOB: 1933-12-21, 86 y.o.   MRN: [id number]  Chief Complaint  Patient presents with  . Orthastatic Hypotesion  . Carotid bruit  . Follow-up    6 month   HPI:    Gloria Anderson  is a 86 y.o. 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  She is extremely sensitive to blood pressure variations.  Generally she has been tolerating carvedilol well without dizziness and fairly good control of hypertension.  She also has hydralazine to use on a as needed basis.  She is also enrolled in   in Remote Patient Monitoring and Principal Care Management as patient is high risk for hospitalization and complications from underlying medical conditions.   She has not noticed any significant dizziness since being compliant with support stockings. She does sleep on a wedge at night due to orthostatic hypertension. Patient states that on the present medical regimen, symptoms of dizziness are very stable. She denies any chest pain or shortness of breath. Continues to remain active.  Past Medical History:  Diagnosis Date  . Arthritis   . Complication of anesthesia   . Constipation   . Depression   . GERD (gastroesophageal reflux disease)   . Hypertension   . Hypothyroidism   . Osteoporosis   . Pneumonia 2018  . Polymyalgia ([place])   . Polymyalgia rheumatica ([place])   . PONV (postoperative nausea and vomiting)    Past Surgical History:  Procedure Laterality Date  . COLONOSCOPY W/ POLYPECTOMY    . EYE SURGERY     both cataracts  . LUMBAR LAMINECTOMY/DECOMPRESSION MICRODISCECTOMY N/A 08/15/2018   Procedure: Lumbar three to Lumbar five Decompressive lumbar laminectomy;  Surgeon: Erline Levine, MD;  Location: [facility];  Service: Neurosurgery;  Laterality: N/A;  . NASAL SINUS SURGERY     . SHOULDER SURGERY Right 2007   repair  . TONSILLECTOMY    . TRIGGER FINGER RELEASE  03/19/2011   Procedure:  left middle finger RELEASE TRIGGER FINGER/A-1 PULLEY;  Surgeon: Cammie Sickle., MD;  Location: [facility];  Service: Orthopedics;  Laterality: Right;  Procedure:  Release Right Long and Ring Trigger Fingers, Release Left Long Trigger Finger, Injection Left Long Proximal Phalangeal Joint  . TRIGGER FINGER RELEASE Right    Ring finger, middle finger  . UPPER GASTROINTESTINAL ENDOSCOPY     Social History   Tobacco Use  . Smoking status: Never Smoker  . Smokeless tobacco: Never Used  Substance Use Topics  . Alcohol use: No    Alcohol/week: 0.0 standard drinks   ROS  Review of Systems  Cardiovascular: Negative for chest pain, claudication, dyspnea on exertion, leg swelling, orthopnea, palpitations and syncope.  Neurological: Negative for dizziness, focal weakness and headaches.  All other systems reviewed and are negative.  Objective  Blood pressure 130/63, pulse 84, resp. rate 16, height 5\' 4"  (1.626 m), weight 117 lb 3.2 oz (53.2 kg), SpO2 100 %.  Vitals with BMI 12/24/2019 11/28/2019 11/28/2019  Height 5\' 4"  - -  Weight 117 lbs 3 oz - -  BMI 19.41 - -  Systolic 740 814 481  Diastolic 63 72 77  Pulse 84 - 72    Orthostatic VS for the past 72 hrs (Last 3 readings):  Orthostatic BP Patient Position BP Location Cuff Size Orthostatic Pulse  12/24/19 1409 116/60  Standing Left Arm Normal 84  12/24/19 1408 144/52 Sitting Left Arm Normal 82  12/24/19 1407 158/62 Supine Left Arm Normal 82    Physical Exam Vitals reviewed.  Constitutional:      Appearance: She is well-developed.  Cardiovascular:     Rate and Rhythm: Normal rate and regular rhythm.     Pulses: Intact distal pulses.          Carotid pulses are on the right side with bruit and on the left side with bruit.      Femoral pulses are on the right side with bruit and on the left side with bruit.     Heart sounds: Murmur heard.  Crescendo-decrescendo mid to late systolic murmur is present with a grade of 2/6 at the apex.   Pulmonary:     Effort: Pulmonary effort is normal. No accessory muscle usage or respiratory distress.     Breath sounds: Normal breath sounds.  Abdominal:     General: There is abdominal bruit.  Neurological:     Mental Status: She is alert and oriented to person, place, and time.    Laboratory examination:   Recent Labs    11/28/19 1511  NA 139  K 4.3  CL 102  CO2 29  GLUCOSE 86  BUN 21  CREATININE 1.24*  CALCIUM 10.0  GFRNONAA 40*  GFRAA 47*   CrCl cannot be calculated (Patient's most recent lab result is older than the maximum 21 days allowed.).  CMP Latest Ref Rng & Units 11/28/2019 08/11/2018 03/23/2018  Glucose 65 - 99 mg/dL 86 110(H) 92  BUN 7 - 25 mg/dL 21 22 21   Creatinine 0.60 - 0.88 mg/dL 1.24(H) 1.26(H) 1.02(H)  Sodium 135 - 146 mmol/L 139 140 140  Potassium 3.5 - 5.3 mmol/L 4.3 4.6 4.5  Chloride 98 - 110 mmol/L 102 105 101  CO2 20 - 32 mmol/L 29 25 30   Calcium 8.6 - 10.4 mg/dL 10.0 9.5 9.9  Total Protein 6.1 - 8.1 g/dL 6.9 - 6.8  Total Bilirubin 0.2 - 1.2 mg/dL 1.4(H) - 0.9  AST 10 - 35 U/L 24 - 34  ALT 6 - 29 U/L 17 - 28   CBC Latest Ref Rng & Units 11/28/2019 08/11/2018 03/23/2018  WBC 3.8 - 10.8 Thousand/uL 4.8 5.3 5.2  Hemoglobin 11.7 - 15.5 g/dL 11.1(L) 11.6(L) 11.4(L)  Hematocrit 35 - 45 % 32.3(L) 34.6(L) 34.0(L)  Platelets 140 - 400 Thousand/uL 179 183 182    External labs:  Cholesterol, total 183.000 m 05/30/2019 HDL 80 MG/DL 05/30/2019 LDL 93.000 mg 05/30/2019 Triglycerides 52.000 05/30/2019  A1C 5.000 % 03/12/2019 TSH 1.980 03/13/2019  Hemoglobin 11.800 g/ 03/12/2019  Creatinine, Serum 1.200 mg/ 04/16/2019 Potassium 4.600 08/11/2018 ALT (SGPT) 20.000 uni 05/30/2019   Medications and allergies  No Known Allergies   Current Outpatient Medications on File Prior to Visit  Medication Sig Dispense Refill  .  acetaminophen (TYLENOL) 325 MG tablet Take 650 mg by mouth every 6 (six) hours as needed.    Marland Kitchen Apoaequorin 10 MG CAPS Take 10 mg by mouth daily.    Marland Kitchen atorvastatin (LIPITOR) 10 MG tablet Take 10 mg by mouth daily.     . Calcium Citrate (CITRACAL PO) Take 1,300 mg by mouth daily. Taking it in the AM    . carvedilol (COREG) 3.125 MG tablet TAKE 1 TABLET(3.125 MG) BY MOUTH FOUR TIMES DAILY (Patient taking differently: 2 (two) times daily with a meal. TAKE 1 TABLET(3.125 MG) BY MOUTH TWICE TIMES DAILY (when  standing SBP >100)) 360 tablet 3  . clonazePAM (KLONOPIN) 0.5 MG tablet Take 1 tablet (0.5 mg total) by mouth at bedtime. 90 tablet 3  . denosumab (PROLIA) 60 MG/ML SOSY injection Inject 60 mg into the skin every 6 (six) months.    Mariane Baumgarten Calcium (STOOL SOFTENER PO) Take 50 mg by mouth daily as needed.     . fish oil-omega-3 fatty acids 1000 MG capsule Take 1 g by mouth daily.     . fluocinonide (LIDEX) 0.05 % external solution Apply 1 application topically 2 (two) times daily.    . Garlic 6503 MG TBEC Take 1,000 mg by mouth daily. [place]    . hydrALAZINE (APRESOLINE) 25 MG tablet Take 1 tablet (25 mg total) by mouth 3 (three) times daily as needed (blood pressure >150). 270 tablet 3  . hydroxychloroquine (PLAQUENIL) 200 MG tablet Take 1 tablet (200 mg total) by mouth daily. 90 tablet 0  . Iron-Vitamins (GERITOL PO) Take 1 tablet by mouth daily.     Marland Kitchen levothyroxine (SYNTHROID, LEVOTHROID) 50 MCG tablet Take 50 mcg by mouth daily before breakfast.     . Lifitegrast (XIIDRA OP) Apply 1 drop to eye 2 (two) times daily.    Marland Kitchen omeprazole (PRILOSEC) 20 MG capsule Take 20 mg by mouth daily.    . polyethylene glycol (MIRALAX / GLYCOLAX) packet Take 17 g by mouth daily as needed for moderate constipation.     . Probiotic Product (PROBIOTIC DAILY PO) Take 1 tablet by mouth daily. [place]    . Propylene Glycol (SYSTANE BALANCE OP) Apply 1 drop to eye in the morning and at bedtime.     No current  facility-administered medications on file prior to visit.     Radiology:  No results found.  Cardiac Studies:   Exercise Treadmill Stress Test 10/07/2017:  Indication: chest pain The patient exercised on Bruce protocol for  06:19 min. Patient achieved  7.40 METS and reached HR  114 bpm, which is  82 % of maximum age-predicted HR.  Stress test terminated due to fatigue.   Exercise capacity was fair for age. HR Response to Exercise: Appropriate. BP Response to Exercise: Normal resting BP- appropriate response. Chest Pain: none. Arrhythmias: Occasional PVC s. Resting EKG demonstrates Normal sinus rhythm. ST Changes: With peak exercise there was no ST-T changes of ischemia.  Overall Impression:  Submaximal stress test with no ischemic changes. Continue primary/secondary prevention.  Echocardiogram 12/13/2018: Normal LV systolic function with EF 67%. Left ventricle cavity is normal in size. Normal left ventricular wall thickness. Normal global wall motion. Diastolic function could not be assessed due to severity of mitral regurgitation.  Moderate (Grade II) mitral regurgitation. Moderate tricuspid regurgitation. Moderate pulmonary hypertension. Estimated pulmonary artery systolic pressure is 50 mmHg.  Small circumferential pericardial effusion wth no hemodynamic compromise.  IVC is dilated with a respiratory response of <50%. Estimated RA pressure 10-15 mmHg. No significant change compared to previous study on 05/09/2018.  Carotid artery duplex  07/04/2019: No hemodynamically significant arterial disease in the internal carotid artery bilaterally. Antegrade right vertebral artery flow. Antegrade left vertebral artery flow.  EKG:    EKG 12/24/2019: Normal sinus rhythm with rate of 81 bpm, normal axis.  Poor R progression, probably normal variant.  Low-voltage complexes.  No significant change from 06/22/2019.  Assessment     ICD-10-CM   1. Orthostatic hypotension  I95.1  EKG 12-Lead  2. Essential hypertension  I10   3. Moderate mitral regurgitation  I34.0  4. Moderate tricuspid regurgitation  I07.1   5. Stage 3b chronic kidney disease (HCC)  N18.32     No orders of the defined types were placed in this encounter.   There are no discontinued medications.   Recommendations:   Cherell Colvin  is a 87 y.o. 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  She is extremely sensitive to blood pressure variations.  Generally she has been tolerating carvedilol well without dizziness and fairly good control of hypertension.  She also has hydralazine to use on a as needed basis.  She is also enrolled in   in Remote Patient Monitoring and Principal Care Management as patient is high risk for hospitalization and complications from underlying medical conditions.  I have again discussed orthostatic hypotension and supine hypertension. Will continue to treat her standing blood pressure and not supine or sitting BP.  She has been compliant with using support stockings and also has been sleeping on a wedge at night to reduce the risk from supine hypertension.  She has been responding well to a combination of carvedilol and hydralazine with supine blood pressure >150 mmHg which brings it down nicely to around 130 to 140 mmHg.  Valvular heart disease has been stable and there is no clinical evidence of heart failure. I do not think she needs repeat echocardiogram. She continues to remain active.  I reviewed her external labs, renal function is remained stable, I discussed with her regarding chronic renal insufficiency, she has made an appointment to see Dr. Harrie Jeans, I will forward a copy to her of this visit. She has had chronic stage IIIa/IIIb kidney disease over the past 3 years.  I also reviewed her carotid artery duplex with the patient,  although she has very prominent carotid bruit, she has no significant carotid disease. Suspect tortuosity. I will see her back on annual basis.  Adrian Prows, MD, [facility] 12/24/2019, 2:43 PM Office: [phone] Pager: [phone]   CC: Harrie Jeans, MD

## 2019-12-28 DIAGNOSIS — I340 Nonrheumatic mitral (valve) insufficiency: Secondary | ICD-10-CM | POA: Diagnosis not present

## 2019-12-28 DIAGNOSIS — E785 Hyperlipidemia, unspecified: Secondary | ICD-10-CM | POA: Diagnosis not present

## 2019-12-28 DIAGNOSIS — R69 Illness, unspecified: Secondary | ICD-10-CM | POA: Diagnosis not present

## 2019-12-28 DIAGNOSIS — N1832 Chronic kidney disease, stage 3b: Secondary | ICD-10-CM | POA: Diagnosis not present

## 2019-12-28 DIAGNOSIS — E039 Hypothyroidism, unspecified: Secondary | ICD-10-CM | POA: Diagnosis not present

## 2019-12-28 DIAGNOSIS — I071 Rheumatic tricuspid insufficiency: Secondary | ICD-10-CM | POA: Diagnosis not present

## 2019-12-28 DIAGNOSIS — R7302 Impaired glucose tolerance (oral): Secondary | ICD-10-CM | POA: Diagnosis not present

## 2019-12-28 DIAGNOSIS — I129 Hypertensive chronic kidney disease with stage 1 through stage 4 chronic kidney disease, or unspecified chronic kidney disease: Secondary | ICD-10-CM | POA: Diagnosis not present

## 2019-12-28 DIAGNOSIS — M353 Polymyalgia rheumatica: Secondary | ICD-10-CM | POA: Diagnosis not present

## 2019-12-28 DIAGNOSIS — I951 Orthostatic hypotension: Secondary | ICD-10-CM | POA: Diagnosis not present

## 2020-01-01 DIAGNOSIS — M353 Polymyalgia rheumatica: Secondary | ICD-10-CM | POA: Diagnosis not present

## 2020-01-01 DIAGNOSIS — I129 Hypertensive chronic kidney disease with stage 1 through stage 4 chronic kidney disease, or unspecified chronic kidney disease: Secondary | ICD-10-CM | POA: Diagnosis not present

## 2020-01-01 DIAGNOSIS — N1832 Chronic kidney disease, stage 3b: Secondary | ICD-10-CM | POA: Diagnosis not present

## 2020-01-01 DIAGNOSIS — K219 Gastro-esophageal reflux disease without esophagitis: Secondary | ICD-10-CM | POA: Diagnosis not present

## 2020-01-07 DIAGNOSIS — R69 Illness, unspecified: Secondary | ICD-10-CM | POA: Diagnosis not present

## 2020-01-09 ENCOUNTER — Encounter: Payer: Self-pay | Admitting: Neurology

## 2020-01-09 ENCOUNTER — Other Ambulatory Visit: Payer: Self-pay | Admitting: Neurology

## 2020-01-09 ENCOUNTER — Other Ambulatory Visit: Payer: Self-pay

## 2020-01-09 ENCOUNTER — Ambulatory Visit: Payer: Medicare HMO | Admitting: Neurology

## 2020-01-09 VITALS — BP 159/72 | HR 76 | Ht 64.0 in | Wt 118.0 lb

## 2020-01-09 DIAGNOSIS — G4750 Parasomnia, unspecified: Secondary | ICD-10-CM | POA: Diagnosis not present

## 2020-01-09 DIAGNOSIS — R419 Unspecified symptoms and signs involving cognitive functions and awareness: Secondary | ICD-10-CM | POA: Diagnosis not present

## 2020-01-09 DIAGNOSIS — F514 Sleep terrors [night terrors]: Secondary | ICD-10-CM

## 2020-01-09 DIAGNOSIS — R569 Unspecified convulsions: Secondary | ICD-10-CM | POA: Diagnosis not present

## 2020-01-09 DIAGNOSIS — Q283 Other malformations of cerebral vessels: Secondary | ICD-10-CM | POA: Diagnosis not present

## 2020-01-09 DIAGNOSIS — R69 Illness, unspecified: Secondary | ICD-10-CM | POA: Diagnosis not present

## 2020-01-09 MED ORDER — CLONAZEPAM 0.5 MG PO TABS: 0.5000 mg | ORAL_TABLET | Freq: Every day | ORAL | 1 refills | Status: DC

## 2020-01-09 MED ORDER — CLONAZEPAM 0.5 MG PO TABS: 0.5000 mg | ORAL_TABLET | Freq: Every day | ORAL | 3 refills | Status: DC

## 2020-01-09 NOTE — Patient Instructions (Signed)
Cerebral Cavernous Malformation, Adult  A cerebral cavernous malformation is an abnormal formation of blood vessels in the brain or spinal cord (central nervous system). The vessels in a cavernous malformation have weak, thin walls that are more likely than regular blood vessels to bleed or leak and cause problems. What are the causes? This condition may be caused by a gene that is passed down from parent to child (inherited). In some cases, the cause is not known. What increases the risk? You are more likely to develop this condition if:  You have a family history of cerebral cavernous malformation.  You have had radiation treatment to treat cancer of the brain. What are the signs or symptoms? Symptoms of this condition include:  Seizures.  Headaches.  Nausea or vomiting.  Tingling, numbness, weakness, or loss of movement in a part of the body.  Problems with vision, hearing, or speech.  Confusion or memory loss. Depending on the location of the cavernous malformation, symptoms may or may not be present. Symptoms may only develop if specific areas of the brain or spinal cord are affected. How is this diagnosed? This condition is diagnosed with an imaging test, such as MRI or a CT scan. How is this treated? Treatment for this condition depends on whether symptoms are present. If you do not have symptoms or a malformation that has caused bleeding, you may only need to have regular MRIs to watch the malformation over time. You may also get medicines to treat any seizures or headaches. If you have symptoms or a malformation that has caused bleeding, surgery to remove the cavernous malformation will be needed. In areas that cannot be safely operated on, a focused beam of radiation (gamma knife) may be used to shrink a cavernous malformation. Follow these instructions at home: Medicines  Take over-the-counter and prescription medicines only as told by your health care provider.  Do not  take blood thinners, such as aspirin, ibuprofen and other NSAIDs, or warfarin, unless your health care provider tells you to do that. General instructions  Learn as much as you can about your condition.  Do not use any products that contain nicotine or tobacco, such as cigarettes, e-cigarettes and chewing tobacco. If you need help quitting, ask your health care provider.  Do physical activities as told by your health care provider. Ask your health care provider what activities are safe for you.  Keep all follow-up visits as told by your health care provider. This is important. Visits may include referrals for physical and occupational therapy, rehabilitation, and lab tests. Contact a health care provider if:  Your symptoms do not improve with treatment.  You develop new symptoms.  You develop a headache that does not go away.  Your symptoms return. Get help right away if you have:   You have any symptoms of a stroke. "BE FAST" is an easy way to remember the main warning signs of a stroke: ? B - Balance. Signs are dizziness, sudden trouble walking, or loss of balance. ? E - Eyes. Signs are trouble seeing or a sudden change in vision. ? F - Face. Signs are sudden weakness or numbness of the face, or the face or eyelid drooping on one side. ? A - Arms. Signs are weakness or numbness in an arm. This happens suddenly and usually on one side of the body. ? S - Speech. Signs are sudden trouble speaking, slurred speech, or trouble understanding what people say. ? T - Time. Time to call  emergency services. Write down what time symptoms started.  You have other signs of a stroke, such as: ? A sudden, severe headache with no known cause. ? Nausea or vomiting. ? Seizure. ? You have a partial or total loss of consciousness. ? Confusion. These symptoms may represent a serious problem that is an emergency. Do not wait to see if the symptoms will go away. Get medical help right away. Call your  local emergency services (911 in the [place] Do not drive yourself to the hospital. Summary  A cerebral cavernous malformation is an abnormal formation of blood vessels in the brain or spinal cord (central nervous system).  Depending on the location of the cavernous malformation, symptoms may or may not be present.  If you do not have symptoms or a malformation that has caused bleeding, you may only need to have regular MRIs to watch the malformation over time.  If you have symptoms or a malformation that has caused bleeding, surgery to remove the cavernous malformation will be needed. This information is not intended to replace advice given to you by your health care provider. Make sure you discuss any questions you have with your health care provider. Document Revised: 09/11/2018 Document Reviewed: 09/11/2018 Elsevier Patient Education  [place].

## 2020-01-09 NOTE — Progress Notes (Signed)
[facility]  Provider:  Larey Seat, MD    Referring Provider: [name], MD Primary Care Physician:  [name], MD  Chief Complaint  Patient presents with  . Follow-up    pt alone, rm 11. pt here 6 mth follow up. states since last visit they were concerned she had some kidney function concerns. at this time stable and they are monitoring. she is following up with MRI/MRA  results.     HPI:   This patient of Dr. 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 She tolerates Klonopin and is happy with the REM sleep control. She has developed higher blood pressures spurs of higher blood pressures that Dr. Forde Dandy and Dr. Einar Gip have followed, and initiated treatment- she developed orthostatic BP!Marland Kitchen She was found to be anemic, sleepy and fatigued. Has normal B 12 and started oral iron. She has a history of PMR. Polymyalgia rheumatica. She had a normal colonoscopy in 2016. Had lost weight , but recovered . Mrs. Kolodny brought her last laboratory results with her, white blood cell count was 3.74K, red blood cell count was 2.9, hemoglobin 9.8 g, hematocrit 28.4%, corpuscular volume 97.1 MCH 33.5. She was over the phone prescribed Nu-iron to take as a supplement. Needs TIBC, Total iron deficiency,   09-26-13- The patient's sleep study from 08-05-13 revealed no apnea, no oxygen desaturation, no irregular heart beats, and only took clusters of limb movements that seem to be related to discomfort with sleeping on the left side. She had no periodic limb movements during REM sleep. There was a prolonged period of slow-wave sleep at around 2 AM during which nor night terrors sleep walking or sleep talking was ordered. The patient reported  that she had another spell- a feeling as if something brushes up on her scalp, or behind her head.  Since the  patient has a long-standing bruit that she can hear at night and that bothers her( tinnitus) I have also suggested to do an MRA of the brain , as I am concerned that they're tortuous vessels , which  may be affecting the left frontal lobe perfusion. This could be a trigger for a nocturnal event as described by the patient clinically. Her MRi documented to my surprise exactly that ! An intracerebral  Venous- cavernous malformation. We are now meeting on 09-26-13 to discuss the results and documented the findings. She had more Night terrors the last weeks. The gabapentin gave her diplopia. I suggest now Topiramate, but she reminded me of involuntary weight loss and loss of apetite. I will try a very low dose of KLONOPIN.  The patient should watch her BP and avoid straining , pressure inducing activities.   RV 02-27-14  Discussed EEG results, no epileptiform activity , but frontal sharps noted. These are of unclear significance.  See report. No change in meds.needed  Patient is free to travel to [place] in April.  The patient,  a caucasian right handed female , originally from [place], reports vivid dreams, with fearful frightful content.  Her dreams have let her to act out some of the dream content, she wakes with her arms extended and with palpitations, tachycardia form fright. She kicks and fell out of bed, yelling, calling out . Following  one dream last year,  she injured her face in a fall . She begun covering all sharp edges.  She has polymyalgia  rheumatica and was treated with prednisone , gaining weight through this time She rises at 8.30, the alarm wakes her at 8.  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 No childhood sleep disorder history. Usually a 7 -8 hour sleeper , refreshed.    Soc history : The patient is widowed for 12 years, has adult children and grandchildren.The bruit in her left temple keeps her form sleeping on the side. She has 3 carotid dopplers over 25 years, diagnosed with  tortious vessel.   MRI brain was normal 4 year ago ( Dr. Carloyn Manner)  No MRA was done at the time , I like to rule out a vascular abnormality at the frontal lobe.  08-04-2015 Her night terrors have responded to the medication, and her spells are rare now. She returned from [place] and had one event which "scared her sister to death "  I have spoken to Dr. Delice Lesch about the possible component of cavernous vascular malformations producing a seizure event. I ordered a 48 hour study but the patient underwent a 72 hour study. This was normal- no seizure activity,  She is doing well on Klonopin. She gained weight on klonopin, (a fact she likes!) She reports vertigo, dizzines with rapid head movementns and bending down, also controlled with Klonopin.  Her eye feel as if pulsating - can this be the AVM? HTN was addressed in the last 2 visits with endocrinology / PCP.  She joked today that her weight has been stabile for so long, she had no excuse for a new wardrobe !  She had no spells for the last 4 month. She needs no refills today.  Interval history from 02/03/2016. [name] reports today that after being almost free of vivid dreams for several month she has 86 years old and not the same frequency as prior to treatment. Some of them are scary they have the character of the night may or or night terror, and she responds was higher heart rate feeling under stress. Klonopin has been working very well at a very low dose for her by now for over 2 years. I would like to continue using Klonopin with the option that if the medication wears off she could increase the dose. She also does not have insomnia when taking Klonopin. She is not fatigued and she is not excessively daytime sleepy, she also does not report that she is groggy in the mornings. She feels stable well-balanced and not at a risk of falling. I will refill her medication today. I also congratulated her to her 86th birthday, which she  celebrates in style this coming Saturday.  Travel history from 15 February 2017, [name] has traveled to summer to [place] with some difficulties.  She had no medical problems however.  She brought me her Klonopin prescription but she had not needed to fill and which is now a year old.  I will refill her Klonopin through [facility] today. She has no complain of grogginess or mental cloudiness in the morning. She has night terrors. She has noted that she is not easily scared , not panicked.   07-26-2017, Tenley Winward is a 86 y.o. female patient for follow up on concerns of cognitive function, memory.  She seems well, is alert and happy. She has a full social life.  She is relieved that the [facility] was normal 26-30 points. Balance is reportedly poor, she is careful.  She continues to report hip pain, and has some night  spells- but much better than before Klonopin was initiated.  Was seen by Dr. Einar Gip cardiology for BP variability- he asked  Her to start wearing compression stockings and elevate her feet at night. She has not always hydrated well, and she is struggling with HTN and orthostatic lightheadedness.   Today 10/05/17:  Ms. Strohm is an 86 year old female with a history of memory disturbance and night terrors.  She returns today to discuss her balance.  She states that she went to see her primary care after she had an episode while ambulating.  She reports that she was walking with a neighbor.  15 minutes in to her walk she lost sensation in the lower part of her back and in her ankles.  She reports that she felt like she had no control over her balance.  She states that she had to sit down.  She reports after that event she had some mild episodes but since then this has resolved and she has not had any trouble walking.  She states that she went for a walk last night and did not have any symptoms.  She states that she has found if she uses a cane this offers her more stability.  In the  past CT of the lumbar spine as well as MRI of the lumbar spine shows stenosis.  She denies any changes with her bowels or bladder.  Denies any numbness or tingling in the lower extremities.  She returns today for an evaluation.  HISTORY Katianne Barre is a 86 y.o. female patient for follow up on concerns of cognitive function, memory.  She seems well, is alert and happy. She has a full social life.  She is relieved that the [facility] was normal 26-30 points. Balance is reportedly poor, she is careful.  She continues to report hip pain, and has some night spells- but much better than before Klonopin was initiated.  Was seen by Dr. Einar Gip cardiology for BP variability- he asked  Her to start wearing compression stockings and elevate her feet at night. She has not always hydrated well, and she is struggling with HTN and orthostatic lightheadedness.   10-20-2017, Mrs. Engen, who is  86 years old reports a sleep walking episode that shook her- she dreamt about a scary situation, left her bed , walked about and finally woke up - realized she had dreamt and is not concerned about repeating this episodes with a risk of injury.  She started on Hydralazine as a new BP medication- could this have contributed/ she has been well controlled on Klonopin, and we will add trazodone tonight.   Interval history from 30 January 2018, I have the pleasure of meeting today with Mr. Jenny Reichmann [employer], this is not an emergency visit but a visit we had on our books for a while.  The patient performed today and a [place] cognitive assessment and she scored 27 out of 30 points which is an entirely normal result.  Especially her short-term recall is excellent.  She had no trouble with naming objects and doing mathematics or arithmetic.  She had normal attention she has some trouble with visual-spatial crest such as drawing or during the Trail Making Test.  This is unchanged to previous results her Epworth sleepiness score was  endorsed at 10 and her fatigue severity score was not endorsed.  Her medication list is available, she is using Klonopin 0.5 mg half tablet by mouth at bedtime. Sleep is fragmented but she feels its related to bathroom breaks.  She reports nocturia 5-6 times, and she drinks water all day. No burning and no foul smelling urine.    RV 01-09-2019, Mrs Newstrom is a meanwhile 86 year old lang time patient whom I refereed toDr Vertell Limber for pain- Aseret Hoffman  is a 86 y.o. female  with essential hypertension and orthostatic hypotension, mild hyperlipidemia, moderate MR and TR, moderate pulmonary hypertension presents here for 6 month follow-up of hypertension and valvular heart disease.  She had back surgery in June 2020 and has not had back pain since then. 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 .  She constantly checks blood pressure and gets worried about elevated blood pressure, I had multiple phone calls in the past. Presently doing well and except for dizziness when she stands up or is on her feet for long period of time, remains asymptomatic, back pain has resolved. Since surgery she had some lightheadedness. Her HTN today is very well controlled, she is wearing compression stockings.   Rv 05-23-2019-  Mrs. Strome describes a frightening sleep spell. She must have left her bed at 4 Am on a Sunday, and remembers dreaming that she reached out for something that moved away , could not be reached, but she remembers reaching out over and over-. She woke up having fallen in front of the door, and a corner of a carpet had turned over- she assumed she stumbled. She has no recollection of any of these complex movements for a while. Had none of the previous spells of feeling someone breathing on her.  She does not have ge headaches, only hematoma on the left arm and elbow. No nausea.some neck stiffness, and she has had cervical evaluation with dr.  Vertell Limber. No numbness in hands or loss of grip strength. Her bottom hurts, like sitting on her hip bones.    Rv 07-09-2019; I have the pleasure of seeing Maleta Mitropoulos today and meanwhile 86 year old Caucasian lady of [place] origin whom I follow for parasomnias and arteriovenous malformations.  She has recently had a carotid Doppler study performed with Dr. Einar Gip and I could see her today the results with her she was very relieved to hear that she had no carotid artery stenosis plaque or flow abnormality.  In addition test of October last year she had an echocardiogram which was also in normal limits.  Today's Montreal cognitive assessment shows 26 out of 30 points which is normal, and the patient was actually able to draw a three-dimensional cube something she has not done in the past.  She recalled 5 out of 5 recall words.  So I am pleasantly supplies and she says that she feels more serene and confident since she has been starting to take her regimen upon recommendation by Dr. Forde Dandy Dr.Perini. It is a very expenesive supplement.   01-09-2020, RV  I am meeting today with Mrs. Mela Perham and meanwhile 86 year old yes patient of Dr. [name].  She had a repeat MRI angio hot without contrast and an MRI with and without contrast of the brain she have the previously noted cavernous malformation in the left cerebellum and right basal ganglia which has not changed, there is no aneurysm seen, all blood brain vessels seem to be without stenosis.  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  There is mild progressive generalized cortical atrophy which is age related.  So overall  there is no acute change or development of a chronic neurodegenerative disease. Her metabolic panels were repeated 11-28-2019, and showed stable GFR 40, Creatinine.     Review of Systems: Out of a complete 14 system review, the patient complains of only  the following symptoms, and all other reviewed systems are negative. Acting out dreams, yelling but not leaving the bed. Sleeps 7- 8 hours, nocturia.   Sleep walking-  05-19-2019 first repeat since 01-30-2018 .  Increased anxiety -Klonopin dependent , but this medication was needed to control her parasomnia. Clonazepam-   Concerned about memory- MOCA was 26/ 30 points 2019, she has not mentioned any more problems. MOCA was today 07-09-2019 also 26/ 30 points. MOCA - repeat in 2022.   Status post spinal stenosis surgery- doing well. Need PT for shoulder.      Social History   Socioeconomic History  . Marital status: Widowed    Spouse name: Not on file  . Number of children: 2  . Years of education: HS  . Highest education level: Not on file  Occupational History  . Occupation: RETIRED    Employer: [employer]  Tobacco Use  . Smoking status: Never Smoker  . Smokeless tobacco: Never Used  Vaping Use  . Vaping Use: Never used  Substance and Sexual Activity  . Alcohol use: No    Alcohol/week: 0.0 standard drinks  . Drug use: Never  . Sexual activity: Not on file  Other Topics Concern  . Not on file  Social History Narrative   Patient is widowed.   Patient has two children.   Patient does not drink any caffeine.    Patient has a high school education.   Patient is right-handed.            Social Determinants of Health   Financial Resource Strain:   . Difficulty of Paying Living Expenses: Not on file  Food Insecurity:   . Worried About Charity fundraiser in the Last Year: Not on file  . Ran Out of Food in the Last Year: Not on file  Transportation Needs:   . Lack of Transportation (Medical): Not on file  . Lack of Transportation (Non-Medical): Not on file  Physical Activity:   . Days of Exercise per Week: Not on file  . Minutes of Exercise per Session: Not on file  Stress:   . Feeling of Stress : Not on file  Social Connections:   . Frequency of Communication  with Friends and Family: Not on file  . Frequency of Social Gatherings with Friends and Family: Not on file  . Attends Religious Services: Not on file  . Active Member of Clubs or Organizations: Not on file  . Attends Archivist Meetings: Not on file  . Marital Status: Not on file  Intimate Partner Violence:   . Fear of Current or Ex-Partner: Not on file  . Emotionally Abused: Not on file  . Physically Abused: Not on file  . Sexually Abused: Not on file    Family History  Problem Relation Age of Onset  . CVA Father   . Tuberculosis Mother   . Lung cancer Brother     Past Medical History:  Diagnosis Date  . Arthritis   . Complication of anesthesia   . Constipation   . Depression   . GERD (gastroesophageal reflux disease)   . Hypertension   . Hypothyroidism   . Osteoporosis   . Pneumonia 2018      .  Polymyalgia rheumatica (HCC) on Plaquenil, not prednisone/   . PONV (postoperative nausea and vomiting)     Past Surgical History:  Procedure Laterality Date  . COLONOSCOPY W/ POLYPECTOMY    . EYE SURGERY     both cataracts  . LUMBAR LAMINECTOMY/DECOMPRESSION MICRODISCECTOMY N/A 08/15/2018   Procedure: Lumbar three to Lumbar five Decompressive lumbar laminectomy;  Surgeon: Erline Levine, MD;  Location: [place];  Service: Neurosurgery;  Laterality: N/A;  . NASAL SINUS SURGERY    . SHOULDER SURGERY Right 2007   repair  . TONSILLECTOMY    . TRIGGER FINGER RELEASE  03/19/2011   Procedure:  left middle finger RELEASE TRIGGER FINGER/A-1 PULLEY;  Surgeon: Cammie Sickle., MD;  Location: [place];  Service: Orthopedics;  Laterality: Right;  Procedure:  Release Right Long and Ring Trigger Fingers, Release Left Long Trigger Finger, Injection Left Long Proximal Phalangeal Joint  . TRIGGER FINGER RELEASE Right    Ring finger, middle finger  . UPPER GASTROINTESTINAL ENDOSCOPY      Current Outpatient Medications  Medication Sig Dispense Refill  .  acetaminophen (TYLENOL) 325 MG tablet Take 650 mg by mouth every 6 (six) hours as needed.    Marland Kitchen Apoaequorin 10 MG CAPS Take 10 mg by mouth daily.    Marland Kitchen atorvastatin (LIPITOR) 10 MG tablet Take 10 mg by mouth daily.     . Calcium Citrate (CITRACAL PO) Take 1,300 mg by mouth daily. Taking it in the AM    . carvedilol (COREG) 3.125 MG tablet TAKE 1 TABLET(3.125 MG) BY MOUTH FOUR TIMES DAILY (Patient taking differently: 2 (two) times daily with a meal. TAKE 1 TABLET(3.125 MG) BY MOUTH TWICE TIMES DAILY (when standing SBP >100)) 360 tablet 3  . clonazePAM (KLONOPIN) 0.5 MG tablet Take 1 tablet (0.5 mg total) by mouth at bedtime. 90 tablet 3  . denosumab (PROLIA) 60 MG/ML SOSY injection Inject 60 mg into the skin every 6 (six) months.    Mariane Baumgarten Calcium (STOOL SOFTENER PO) Take 50 mg by mouth daily as needed.     . fish oil-omega-3 fatty acids 1000 MG capsule Take 1 g by mouth daily.     . fluocinonide (LIDEX) 0.05 % external solution Apply 1 application topically 2 (two) times daily.    . Garlic 9678 MG TBEC Take 1,000 mg by mouth daily. [place]    . hydrALAZINE (APRESOLINE) 25 MG tablet Take 1 tablet (25 mg total) by mouth 3 (three) times daily as needed (blood pressure >150). 270 tablet 3  . hydroxychloroquine (PLAQUENIL) 200 MG tablet Take 1 tablet (200 mg total) by mouth daily. 90 tablet 0  . Iron-Vitamins (GERITOL PO) Take 1 tablet by mouth daily.     Marland Kitchen levothyroxine (SYNTHROID, LEVOTHROID) 50 MCG tablet Take 50 mcg by mouth daily before breakfast.     . Lifitegrast (XIIDRA OP) Apply 1 drop to eye 2 (two) times daily.    Marland Kitchen omeprazole (PRILOSEC) 20 MG capsule Take 20 mg by mouth daily.    . polyethylene glycol (MIRALAX / GLYCOLAX) packet Take 17 g by mouth daily as needed for moderate constipation.     . Probiotic Product (PROBIOTIC DAILY PO) Take 1 tablet by mouth daily. Culturelle    . Propylene Glycol (SYSTANE BALANCE OP) Apply 1 drop to eye in the morning and at bedtime.     No current  facility-administered medications for this visit.    Allergies as of 01/09/2020  . (No Known Allergies)  Vitals: BP (!) 159/72 (BP Location: Left Arm, Patient Position: Sitting)   Pulse 76   Ht 5\' 4"  (1.626 m)   Wt 118 lb (53.5 kg)   BMI 20.25 kg/m  Last Weight:  Wt Readings from Last 1 Encounters:  01/09/20 118 lb (53.5 kg)   Last Height:   Ht Readings from Last 1 Encounters:  01/09/20 5\' 4"  (1.626 m)    Physical exam:  General: The patient is awake, alert and appears not in acute distress. The patient is well groomed. Head: Normocephalic, atraumatic. Neck is supple. Mallampati 3 , retrognathia , neck circumference:14. 25". inches.  Cardiovascular:  Regular rate and rhythm , without  murmurs , has a left sided carotid bruit, no distended neck veins. Respiratory: Lungs are clear to auscultation. Skin:  Without evidence of edema, or rash Trunk:  normal posture.  Neurologic exam : [facility] Cognitive Assessment  07/09/2019 01/09/2019 01/30/2018 07/26/2017 07/26/2017  Visuospatial/ Executive (0/5) 3 2 3 3 3   Naming (0/3) 2 3 3 3 3   Attention: Read list of digits (0/2) 2 1 2 2 2   Attention: Read list of letters (0/1) 1 1 1 1 1   Attention: Serial 7 subtraction starting at 100 (0/3) 3 3 3 3 3   Language: Repeat phrase (0/2) 2 2 2 1 1   Language : Fluency (0/1) 0 1 0 0 0  Abstraction (0/2) 2 2 2 2 2   Delayed Recall (0/5) 5 5 5 5 5   Orientation (0/6) 6 6 6 6 6   Total 26 26 27 26 26   Adjusted Score (based on education) - - - - -   her blood pressue ,   The patient is awake and alert, oriented to place and time.  Memory see above; we did not repeat MOCA.  She reports some delay in word-finding. Auditory processing speed- she felt subjectively delayed last visit - today not at all- she appears to be happier, more confident. What ever Prevagen does or doesn't do , it's not harmful to her-There is a normal attention span & concentration ability.  Speech is fluent with mild hoarseness  - and no aphasia .Mood and affect are appropriate. Cranial nerves: Pupils are equal and briskly reactive to light.  Hearing to finger rub intact. Facial sensation intact to fine touch. Facial motor strength is symmetric and tongue and uvula move again in midline.no tremor, no fasciculating.   Motor tone: normal, there is very little muscle mass, no cog wheeling, grip strength and hip flexion, adduction and abduction.  The hands hurt due to rheumatic condition. Right hip pain. This more than anything restricts ROM and fluidity of movements.    Mrs. Bachand gait is still very measured but not displaying difficulties with  balance, she can turn with 3.5 steps 180 degrees, she does not have a drift ,no propulsive tendency was noted. She can walk tandem (!) . Her right foot points a bit more outwards.   She could rise from a seated position in a chair without bracing herself, there is no tremor noted normal arm swing. There is also no focal weakness, focal sensory loss and good coordination.  Handwriting and drawing do not reveal any changes.  Cognitive testing as below was unremarkable.  The patient performed a Montreal cognitive assessment again- which is more difficult than the Mini-Mental Status Examination- and mastered it again well.  She has  Parasomnia with REM BD.   She has had back pain, She underwent L3-4 and L 4-5 severe stenosis surgery-  Buttocks feel much better, not as sore - sacroiliac pain rather than hip?   -No surgery for the neck was recommended and only massage was recommended.   After physical and neurologic examination, review of laboratory studies, imaging, neurophysiology testing and pre-existing records, assessment is :  PLAN:   stay on Klonopin, low dose of 0.25 mg nightly, will increase to 0.5 mg thus increasing the threshold to sleep walking. What ever Prevagen does or doesn't do , it's not harmful to her-There is a normal attention span & concentration ability.     RV every 10-12 month with me- MOCA next time in May 2022.   Larey Seat, MD     07-09-2019.   Cc Dr Forde Dandy ,  Dr Einar Gip.  Dr Vertell Limber.

## 2020-01-10 ENCOUNTER — Other Ambulatory Visit (HOSPITAL_COMMUNITY): Payer: Self-pay | Admitting: *Deleted

## 2020-01-11 ENCOUNTER — Ambulatory Visit (HOSPITAL_COMMUNITY)
Admission: RE | Admit: 2020-01-11 | Discharge: 2020-01-11 | Disposition: A | Discharge status: Home / Self Care | Payer: Medicare HMO | Source: Ambulatory Visit | Attending: Endocrinology | Admitting: Endocrinology

## 2020-01-11 ENCOUNTER — Other Ambulatory Visit: Payer: Self-pay

## 2020-01-11 DIAGNOSIS — M810 Age-related osteoporosis without current pathological fracture: Secondary | ICD-10-CM | POA: Diagnosis not present

## 2020-01-11 MED ORDER — DENOSUMAB 60 MG/ML ~~LOC~~ SOSY
60.0000 mg | PREFILLED_SYRINGE | Freq: Once | SUBCUTANEOUS | Status: AC
  Administered 2020-01-11: 60 mg via SUBCUTANEOUS

## 2020-01-11 MED ORDER — DENOSUMAB 60 MG/ML ~~LOC~~ SOSY
PREFILLED_SYRINGE | SUBCUTANEOUS | Status: AC
  Filled 2020-01-11: qty 1

## 2020-01-16 ENCOUNTER — Telehealth: Payer: Self-pay

## 2020-01-16 NOTE — Telephone Encounter (Signed)
Patient advised we have not received the records from [facility] as of yet. Patient advised will call once we receive the office notes and results we will have Dr. Estanislado Pandy review and call to review with her. Patient expressed understanding.

## 2020-01-16 NOTE — Telephone Encounter (Signed)
Patient called stating she has decided that she does want to attend physical therapy.  Patient states she called [facility] where Dr. Estanislado Pandy sent her referral, but was told the referral was too old and would need a new referral before she could schedule an appointment.  Patient is also asking if her left shoulder could be added to the order which included lower extremity muscle strengthening and fall prevention.

## 2020-01-16 NOTE — Telephone Encounter (Signed)
I called patient, Left shoulder PT added to original PT referral, referral faxed to [facility].

## 2020-01-16 NOTE — Telephone Encounter (Signed)
Patient called to check if Dr. Estanislado Pandy received the results from the testing she had at [facility].  Patient states she received a copy, but is having "trouble understanding some of the numbers."  Patient requested a return call.

## 2020-01-18 ENCOUNTER — Other Ambulatory Visit: Payer: Self-pay

## 2020-01-18 NOTE — Telephone Encounter (Signed)
Patient called back requesting a return call to let her know the prescription has been sent.

## 2020-01-18 NOTE — Telephone Encounter (Signed)
Patient left a voicemail requesting prescription refill of Hydroxychloroquine to be sent to [facility].

## 2020-01-18 NOTE — Telephone Encounter (Signed)
Last Visit: 11/28/2019 Next Visit: 05/01/2020 Labs: 11/28/2019 Anemia is a stable. GFR is still low but is stable.  Eye exam: 10/16/2019 normal.  Current Dose per office note 11/28/2019: Plaquenil 200 mg p.o. daily DX: PMR   Okay to refill Plaquenil?

## 2020-01-21 MED ORDER — HYDROXYCHLOROQUINE SULFATE 200 MG PO TABS
200.0000 mg | ORAL_TABLET | Freq: Every day | ORAL | 0 refills | Status: DC
Start: 2020-01-21 — End: 2020-01-31

## 2020-01-22 DIAGNOSIS — L821 Other seborrheic keratosis: Secondary | ICD-10-CM | POA: Diagnosis not present

## 2020-01-22 DIAGNOSIS — L308 Other specified dermatitis: Secondary | ICD-10-CM | POA: Diagnosis not present

## 2020-01-22 DIAGNOSIS — L218 Other seborrheic dermatitis: Secondary | ICD-10-CM | POA: Diagnosis not present

## 2020-01-31 ENCOUNTER — Telehealth: Payer: Self-pay | Admitting: Neurology

## 2020-01-31 ENCOUNTER — Other Ambulatory Visit: Payer: Self-pay | Admitting: Rheumatology

## 2020-01-31 NOTE — Telephone Encounter (Signed)
Noted we have taken the local pharmacy out of the chart

## 2020-01-31 NOTE — Telephone Encounter (Signed)
Pt has asked that Casey,RN be aware that Pt wants her medications sent only thru [facility], this is FYI no call back requested

## 2020-01-31 NOTE — Telephone Encounter (Signed)
Last Visit: 11/28/2019 Next Visit: 05/01/2020 Labs: 11/28/2019 Anemia is a stable. GFR is still low but is stable.  Eye exam: 10/16/2019 normal.  Current Dose per office note 11/28/2019: Plaquenil 200 mg p.o. daily DX: PMR   Okay to refill per Dr. Estanislado Pandy

## 2020-02-04 ENCOUNTER — Other Ambulatory Visit: Payer: Self-pay | Admitting: Physician Assistant

## 2020-02-04 NOTE — Telephone Encounter (Signed)
Last Visit: 11/28/2019 Next Visit: 05/01/2020 Labs: 11/28/2019 Anemia is a stable. GFR is still low but is stable.  Eye exam: 10/16/2019 normal.  Current Dose per office note 11/28/2019: Plaquenil 200 mg p.o. daily DX: PMR   Okay to refill PLQ?

## 2020-02-19 ENCOUNTER — Ambulatory Visit: Payer: Medicare HMO | Attending: Rheumatology | Admitting: Physical Therapy

## 2020-02-19 ENCOUNTER — Encounter: Payer: Self-pay | Admitting: Physical Therapy

## 2020-02-19 ENCOUNTER — Other Ambulatory Visit: Payer: Self-pay

## 2020-02-19 DIAGNOSIS — G8929 Other chronic pain: Secondary | ICD-10-CM | POA: Diagnosis not present

## 2020-02-19 DIAGNOSIS — M25512 Pain in left shoulder: Secondary | ICD-10-CM | POA: Diagnosis not present

## 2020-02-19 DIAGNOSIS — M6281 Muscle weakness (generalized): Secondary | ICD-10-CM | POA: Insufficient documentation

## 2020-02-19 DIAGNOSIS — R279 Unspecified lack of coordination: Secondary | ICD-10-CM | POA: Insufficient documentation

## 2020-02-19 NOTE — Therapy (Signed)
[facility] [address] [place], [place], [address] Phone: [phone]   Fax:  [phone]  Physical Therapy Evaluation  Patient Details  Name: Gloria Anderson MRN: [id number] Date of Birth: 1933-11-15 Referring Provider (PT): Bo Merino, MD   Encounter Date: 02/19/2020   PT End of Session - 02/19/20 1339    Visit Number 1    Date for PT Re-Evaluation 05/13/20    Authorization Type [employer]    Progress Note Due on Visit 10    PT Start Time 1230    PT Stop Time 1315    PT Time Calculation (min) 45 min    Activity Tolerance Patient tolerated treatment well    Behavior During Therapy [facility] for tasks assessed/performed           Past Medical History:  Diagnosis Date  . Arthritis   . Complication of anesthesia   . Constipation   . Depression   . GERD (gastroesophageal reflux disease)   . Hypertension   . Hypothyroidism   . Osteoporosis   . Pneumonia 2018  . Polymyalgia ([place])   . Polymyalgia rheumatica ([place])   . PONV (postoperative nausea and vomiting)     Past Surgical History:  Procedure Laterality Date  . COLONOSCOPY W/ POLYPECTOMY    . EYE SURGERY     both cataracts  . LUMBAR LAMINECTOMY/DECOMPRESSION MICRODISCECTOMY N/A 08/15/2018   Procedure: Lumbar three to Lumbar five Decompressive lumbar laminectomy;  Surgeon: Erline Levine, MD;  Location: [facility];  Service: Neurosurgery;  Laterality: N/A;  . NASAL SINUS SURGERY    . SHOULDER SURGERY Right 2007   repair  . TONSILLECTOMY    . TRIGGER FINGER RELEASE  03/19/2011   Procedure:  left middle finger RELEASE TRIGGER FINGER/A-1 PULLEY;  Surgeon: Cammie Sickle., MD;  Location: [facility];  Service: Orthopedics;  Laterality: Right;  Procedure:  Release Right Long and Ring Trigger Fingers, Release Left Long Trigger Finger, Injection Left Long Proximal Phalangeal Joint  . TRIGGER FINGER RELEASE Right    Ring finger, middle  finger  . UPPER GASTROINTESTINAL ENDOSCOPY      There were no vitals filed for this visit.    Subjective Assessment - 02/19/20 1232    Subjective Pt is in remission with polymyalgia rheumatica.  Pt has some intermittent Lt shoulder pain and loss of range.  Pt is referred to address shoulder pain and provide LE strength and fall prevention as she has osteoporosis.    Pertinent History lumbar laminectomy 2021, Rt shoulder surgery    Limitations Lifting;House hold activities;Other (comment)   rising from floor or deep squat   Patient Stated Goals LE strength, balance, Lt shoulder use for functional tasks    Multiple Pain Sites No   lt shoulder can reach 7/10, when shoulder hurts it is hard to use, I avoid it, no pain today             [facility] PT Assessment - 02/19/20 0001      Assessment   Medical Diagnosis M25.512 (ICD-10-CM) - Left shoulder pain M35.3 (ICD-10-CM) - PMR (polymyalgia rheumatica) (HCC)    Referring Provider (PT) Bo Merino, MD    Next MD Visit 4 mos    Prior Therapy yes for lumbar at this facility      Precautions   Precautions Fall    Precaution Comments osteoporosis, no history of recent falls      Balance Screen   Has the patient fallen  in the past 6 months No    Has the patient had a decrease in activity level because of a fear of falling?  No    Is the patient reluctant to leave their home because of a fear of falling?  No      Home Environment   Living Environment Private residence    Living Arrangements Alone    Type of [place] Access Stairs to enter    Entrance Stairs-Number of Steps 4    Entrance Stairs-Rails Can reach both    [place] One level      Prior Function   Level of [place] Retired    Leisure Clinical research associate   Overall Cognitive Status Within Functional Limits for tasks assessed      Observation/Other Assessments   Focus on Therapeutic Outcomes (FOTO)  45% goal 35%       Functional Tests   Functional tests Squat      Squat   Comments knee valgus bil, some weakness on return to stand from deep squat      Posture/Postural Control   Posture/Postural Control Postural limitations    Postural Limitations Increased thoracic kyphosis    Posture Comments scapular winging bil      ROM / Strength   AROM / PROM / Strength AROM;Strength;PROM      AROM   AROM Assessment Site Shoulder;Cervical    Right/Left Shoulder Left    Left Shoulder Flexion 120 Degrees    Left Shoulder ABduction 100 Degrees    Cervical - Right Rotation 60    Cervical - Left Rotation 30      PROM   Overall PROM Comments Lt shoulder abd 120, flexion 110 capsular end feel      Strength   Overall Strength Comments bil shoulders 4/5 tested at side, serratus anterior 4-/5 bil, LEs 4+/5      Ambulation/Gait   Gait Pattern Decreased weight shift to right;Decreased weight shift to left;Decreased step length - right;Decreased step length - left;Decreased arm swing - right;Decreased arm swing - left      Standardized Balance Assessment   Standardized Balance Assessment Five Times Sit to Stand;Berg Balance Test    Five times sit to stand comments  13 sec no hands      Berg Balance Test   Sit to Stand Able to stand without using hands and stabilize independently    Standing Unsupported Able to stand safely 2 minutes    Sitting with Back Unsupported but Feet Supported on Floor or Stool Able to sit safely and securely 2 minutes    Stand to Sit Sits safely with minimal use of hands    Transfers Able to transfer safely, minor use of hands    Standing Unsupported with Eyes Closed Able to stand 10 seconds safely    Standing Unsupported with Feet Together Able to place feet together independently and stand for 1 minute with supervision    From Standing, Reach Forward with Outstretched Arm Can reach forward >5 cm safely (2")    From Standing Position, Pick up Object from Floor Able to pick up shoe  safely and easily    From Standing Position, Turn to Look Behind Over each Shoulder Looks behind from both sides and weight shifts well    Turn 360 Degrees Able to turn 360 degrees safely but slowly    Standing Unsupported, Alternately Place Feet on Step/Stool Able  to stand independently and safely and complete 8 steps in 20 seconds    Standing Unsupported, One Foot in [place] to plae foot ahead of the other independently and hold 30 seconds    Standing on One Leg Able to lift leg independently and hold equal to or more than 3 seconds    Total Score 48    Berg comment: 48/56                      Objective measurements completed on examination: See above findings.                 PT Short Term Goals - 02/19/20 1333      PT SHORT TERM GOAL #1   Title Pt will be ind with initial HEP    Time 4    Period Weeks    Status New    Target Date 03/18/20      PT SHORT TERM GOAL #2   Title Pt will report at least 25% less pain with functional use of Lt UE for ADLs and light household tasks.    Time 6    Period Weeks    Status New    Target Date 04/01/20      PT SHORT TERM GOAL #3   Title Pt will demo proper hip and knee alignment with deep squat to reach floor item.    Time 6    Period Weeks    Status New    Target Date 04/01/20             PT Long Term Goals - 02/19/20 1335      PT LONG TERM GOAL #1   Title Pt will be ind with advanced HEP and understand how to safely progress    Time 12    Period Weeks    Status New    Target Date 05/13/20      PT LONG TERM GOAL #2   Title Improve FOTO to </= 35% to demo less limitation of Lt shoulder.    Baseline 45%    Time 12    Period Weeks    Status New    Target Date 05/13/20      PT LONG TERM GOAL #3   Title Pt will achieve at least 4+/5 strength in scapular stabilizers and Lt shoulder for improved functional use with less pain.    Baseline -    Time 12    Period Weeks    Status New    Target  Date 05/13/20      PT LONG TERM GOAL #4   Title Pt will improve functional strength of bil LEs to allow her to rise from floor sitting.    Baseline -    Time 12    Period Weeks    Status New    Target Date 05/13/20      PT LONG TERM GOAL #5   Title Pt will improve BERG score to at least 52/56 to demo reduced fall risk with ability to perform SLS for at least 10 sec on each leg    Time 12    Period Weeks    Status New    Target Date 05/13/20                  Plan - 02/19/20 1324    Clinical Impression Statement Pt is referred to PT to address Lt shoulder pain and for LE strength and fall prevention.  Pt has no history of falls but has osteoporosis and lacks confidence in her balance.  Pt has intermittent Lt shoulder pain and she avoids using it when it hurts.  Pain can reach a 7/10.  She lacks both active and passive motion at end range flexion and abduction with capsular end feel.  Rotation of Lt shoulder is WFL for reaching behind back and to head but there is some pain with functional ER.  Pt has 4/5 strength in bil shoulders with scaplar winging bil.  LE strength is 4+/5 throughout.  FOTO IS 45% with goal of 35% for Lt shoulder.  She scores 48/56 on Berg, with most challenging tasks including SLS and narrow BOS.  5x sit to stand is 13 sec with good control and no use of UEs, although knee valgus is present with deeper squat challenge in [place].  Pt hopes to become strong enough to rise from floor.  She will benefit from skilled PT to address deficits and improve strenth and balance.  She plans to return to aquatic classes in Jan 2022.    Personal Factors and Comorbidities Age    Examination-Activity Limitations Squat;Reach Overhead;Lift    Examination-Participation Restrictions Cleaning;Laundry;Shop    Stability/Clinical Decision Making Stable/Uncomplicated    Clinical Decision Making Low    Rehab Potential Good    PT Frequency 1x / week    PT Duration 12 weeks    PT  Treatment/Interventions ADLs/Self Care Home Management;Electrical Stimulation;Cryotherapy;Moist Heat;Balance training;Neuromuscular re-education;Therapeutic exercise;Therapeutic activities;Functional mobility training;Gait training;Patient/family education;Manual techniques;Dry needling;Passive range of motion    PT Next Visit Plan spinal decompression series, Lt shoulder AA/ROM (supine with dowel, pulleys), scapular row and protraction, bil shoulder ER and ext, hip abd with sit to stand, counter march and functional squat    PT Home Exercise Plan Access Code: [email or web address] (counter SLS and tandem stance)    Consulted and Agree with Plan of Care Patient           Patient will benefit from skilled therapeutic intervention in order to improve the following deficits and impairments:  Decreased range of motion,Postural dysfunction,Decreased strength,Pain,Improper body mechanics,Impaired UE functional use,Decreased balance  Visit Diagnosis: Chronic left shoulder pain - Plan: PT plan of care cert/re-cert  Muscle weakness (generalized) - Plan: PT plan of care cert/re-cert  Unspecified lack of coordination - Plan: PT plan of care cert/re-cert     Problem List Patient Active Problem List   Diagnosis Date Noted  . Sleep walking 05/23/2019  . Cognitive complaints with normal exam 05/23/2019  . Cerebral arteriovenous malformation (AVM) 05/23/2019  . 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 Nocturnal seizures ([place]) 05/23/2019  . Adult night terrors 01/09/2019  . Degenerative lumbar spinal stenosis 08/15/2018  . Memory difficulty 07/26/2017  . Other parasomnia 07/26/2017  . Anemia 06/02/2017  . Osteopenia 06/02/2017  . Spinal stenosis 06/02/2017  . Iron deficiency anemia secondary to inadequate dietary iron intake 08/05/2016  . Fatigue associated with anemia 08/05/2016  . Primary osteoarthritis of both feet 02/04/2016  . Essential hypertension 02/04/2016  . PMR  (polymyalgia rheumatica) (HCC) 02/03/2016  . Osteoarthritis, hand 02/03/2016  . Age-related osteoporosis without current pathological fracture 02/03/2016  . DDD (degenerative disc disease), lumbar 02/03/2016  . High risk medication use 02/03/2016  . 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 Cerebral cavernous malformation type 1 12/04/2013  . Night terrors, adult 09/26/2013  . HEMORRHOIDS-EXTERNAL 09/19/2009  . GERD 09/19/2009  . CONSTIPATION 09/19/2009  .  DYSPHAGIA 09/19/2009  . PERSONAL HISTORY OF COLONIC POLYPS 09/19/2009    Baruch Merl, PT 02/19/20 1:40 PM   [facility] [address], [place], [place], [address] Phone: [phone]   Fax:  [phone]  Name: Gloria Anderson MRN: [id number] Date of Birth: [birth year]

## 2020-02-19 NOTE — Patient Instructions (Signed)
Access Code: [email or web address] URL: [email or web address] Date: 02/19/2020 Prepared by: Venetia Night Keo Schirmer  Exercises Standing Single Leg Stance with Counter Support - 1 x daily - 7 x weekly - 1 sets - 3 reps - 30 hold Tandem Stance with Support - 1 x daily - 7 x weekly - 1 sets - 3 reps - 15 hold

## 2020-03-01 HISTORY — PX: BASAL CELL CARCINOMA EXCISION: SHX1214

## 2020-03-11 ENCOUNTER — Other Ambulatory Visit: Payer: Self-pay | Admitting: Cardiology

## 2020-03-11 ENCOUNTER — Ambulatory Visit: Payer: Medicare HMO | Admitting: Physical Therapy

## 2020-03-11 DIAGNOSIS — I10 Essential (primary) hypertension: Secondary | ICD-10-CM

## 2020-03-18 ENCOUNTER — Encounter: Payer: Medicare HMO | Admitting: Physical Therapy

## 2020-03-21 ENCOUNTER — Encounter: Payer: Self-pay | Admitting: Rheumatology

## 2020-03-21 ENCOUNTER — Ambulatory Visit: Payer: Medicare HMO | Admitting: Rheumatology

## 2020-03-21 ENCOUNTER — Other Ambulatory Visit: Payer: Self-pay

## 2020-03-21 VITALS — BP 95/59 | HR 84 | Resp 12 | Ht 64.0 in | Wt 120.2 lb

## 2020-03-21 DIAGNOSIS — M19042 Primary osteoarthritis, left hand: Secondary | ICD-10-CM

## 2020-03-21 DIAGNOSIS — M5136 Other intervertebral disc degeneration, lumbar region: Secondary | ICD-10-CM | POA: Diagnosis not present

## 2020-03-21 DIAGNOSIS — F419 Anxiety disorder, unspecified: Secondary | ICD-10-CM

## 2020-03-21 DIAGNOSIS — M19041 Primary osteoarthritis, right hand: Secondary | ICD-10-CM | POA: Diagnosis not present

## 2020-03-21 DIAGNOSIS — M353 Polymyalgia rheumatica: Secondary | ICD-10-CM

## 2020-03-21 DIAGNOSIS — M25512 Pain in left shoulder: Secondary | ICD-10-CM

## 2020-03-21 DIAGNOSIS — Z9889 Other specified postprocedural states: Secondary | ICD-10-CM

## 2020-03-21 DIAGNOSIS — M19071 Primary osteoarthritis, right ankle and foot: Secondary | ICD-10-CM

## 2020-03-21 DIAGNOSIS — M5030 Other cervical disc degeneration, unspecified cervical region: Secondary | ICD-10-CM

## 2020-03-21 DIAGNOSIS — M810 Age-related osteoporosis without current pathological fracture: Secondary | ICD-10-CM

## 2020-03-21 DIAGNOSIS — F514 Sleep terrors [night terrors]: Secondary | ICD-10-CM

## 2020-03-21 DIAGNOSIS — Z79899 Other long term (current) drug therapy: Secondary | ICD-10-CM | POA: Diagnosis not present

## 2020-03-21 DIAGNOSIS — F32A Depression, unspecified: Secondary | ICD-10-CM

## 2020-03-21 DIAGNOSIS — N189 Chronic kidney disease, unspecified: Secondary | ICD-10-CM | POA: Diagnosis not present

## 2020-03-21 DIAGNOSIS — G8929 Other chronic pain: Secondary | ICD-10-CM

## 2020-03-21 DIAGNOSIS — G4489 Other headache syndrome: Secondary | ICD-10-CM

## 2020-03-21 DIAGNOSIS — Z8719 Personal history of other diseases of the digestive system: Secondary | ICD-10-CM

## 2020-03-21 DIAGNOSIS — M19072 Primary osteoarthritis, left ankle and foot: Secondary | ICD-10-CM

## 2020-03-21 DIAGNOSIS — Z8679 Personal history of other diseases of the circulatory system: Secondary | ICD-10-CM

## 2020-03-21 NOTE — Progress Notes (Signed)
Office Visit Note  Patient: Gloria Anderson             Date of Birth: 04-03-1933           MRN: [id number]             PCP: [name], MD Referring: [name], MD Visit Date: 03/21/2020 Occupation: @GUAROCC @  Subjective:  Medication management.   History of Present Illness: Chatney Gregory is a 87 y.o. female with history of polymyalgia rheumatica and osteoarthritis.  She denies any increased muscle weakness or pain.  She continues to have some discomfort in her joints from osteoarthritis.  She also has a stiffness in her neck and lower back.  Her main concern is her hypertension today.  She states her blood pressure fluctuates all the time.  She states her blood pressure runs very high and then when she takes the blood pressure medication her blood pressure drops down.  She says recently her blood pressures been running very low.  She also gets dizzy when she gets up from the sitting position.  Activities of Daily Living:  Patient reports morning stiffness for 0 minutes.   Patient Denies nocturnal pain.  Difficulty dressing/grooming: Denies Difficulty climbing stairs: Denies Difficulty getting out of chair: Denies Difficulty using hands for taps, buttons, cutlery, and/or writing: Reports  Review of Systems  Constitutional: Negative for fatigue, night sweats, weight gain and weight loss.  HENT: Negative for mouth sores, trouble swallowing, trouble swallowing, mouth dryness and nose dryness.   Eyes: Positive for dryness. Negative for pain, redness, itching and visual disturbance.  Respiratory: Negative for cough, shortness of breath and difficulty breathing.   Cardiovascular: Negative for chest pain, palpitations, hypertension, irregular heartbeat and swelling in legs/feet.  Gastrointestinal: Positive for constipation. Negative for blood in stool and diarrhea.  Endocrine: Negative for increased urination.  Genitourinary: Negative for difficulty urinating and vaginal  dryness.  Musculoskeletal: Positive for arthralgias and joint pain. Negative for joint swelling, myalgias, muscle weakness, morning stiffness, muscle tenderness and myalgias.  Skin: Negative for color change, rash, hair loss, redness, skin tightness, ulcers and sensitivity to sunlight.  Allergic/Immunologic: Negative for susceptible to infections.  Neurological: Positive for dizziness, headaches and memory loss. Negative for numbness, night sweats and weakness.  Hematological: Negative for bruising/bleeding tendency and swollen glands.  Psychiatric/Behavioral: Positive for depressed mood and sleep disturbance. Negative for confusion. The patient is nervous/anxious.     PMFS History:  Patient Active Problem List   Diagnosis Date Noted  . Sleep walking 05/23/2019  . Cognitive complaints with normal exam 05/23/2019  . Cerebral arteriovenous malformation (AVM) 05/23/2019  . 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 Nocturnal seizures ([place]) 05/23/2019  . Adult night terrors 01/09/2019  . Degenerative lumbar spinal stenosis 08/15/2018  . Memory difficulty 07/26/2017  . Other parasomnia 07/26/2017  . Anemia 06/02/2017  . Osteopenia 06/02/2017  . Spinal stenosis 06/02/2017  . Iron deficiency anemia secondary to inadequate dietary iron intake 08/05/2016  . Fatigue associated with anemia 08/05/2016  . Primary osteoarthritis of both feet 02/04/2016  . Essential hypertension 02/04/2016  . PMR (polymyalgia rheumatica) (HCC) 02/03/2016  . Osteoarthritis, hand 02/03/2016  . Age-related osteoporosis without current pathological fracture 02/03/2016  . DDD (degenerative disc disease), lumbar 02/03/2016  . High risk medication use 02/03/2016  . 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 Cerebral cavernous malformation type 1 12/04/2013  . Night terrors, adult 09/26/2013  . HEMORRHOIDS-EXTERNAL 09/19/2009  .  GERD 09/19/2009  .  CONSTIPATION 09/19/2009  . DYSPHAGIA 09/19/2009  . PERSONAL HISTORY OF COLONIC POLYPS 09/19/2009    Past Medical History:  Diagnosis Date  . Arthritis   . Complication of anesthesia   . Constipation   . Depression   . GERD (gastroesophageal reflux disease)   . Hypertension   . Hypothyroidism   . Osteoporosis   . Pneumonia 2018  . Polymyalgia ([place])   . Polymyalgia rheumatica ([place])   . PONV (postoperative nausea and vomiting)     Family History  Problem Relation Age of Onset  . CVA Father   . Tuberculosis Mother   . Lung cancer Brother    Past Surgical History:  Procedure Laterality Date  . COLONOSCOPY W/ POLYPECTOMY    . EYE SURGERY     both cataracts  . LUMBAR LAMINECTOMY/DECOMPRESSION MICRODISCECTOMY N/A 08/15/2018   Procedure: Lumbar three to Lumbar five Decompressive lumbar laminectomy;  Surgeon: Erline Levine, MD;  Location: [facility];  Service: Neurosurgery;  Laterality: N/A;  . NASAL SINUS SURGERY    . SHOULDER SURGERY Right 2007   repair  . TONSILLECTOMY    . TRIGGER FINGER RELEASE  03/19/2011   Procedure:  left middle finger RELEASE TRIGGER FINGER/A-1 PULLEY;  Surgeon: Cammie Sickle., MD;  Location: [facility];  Service: Orthopedics;  Laterality: Right;  Procedure:  Release Right Long and Ring Trigger Fingers, Release Left Long Trigger Finger, Injection Left Long Proximal Phalangeal Joint  . TRIGGER FINGER RELEASE Right    Ring finger, middle finger  . UPPER GASTROINTESTINAL ENDOSCOPY     Social History   Social History Narrative   Patient is widowed.   Patient has two children.   Patient does not drink any caffeine.    Patient has a high school education.   Patient is right-handed.            Immunization History  Administered Date(s) Administered  . Influenza, High Dose Seasonal PF 12/19/2016  . PFIZER(Purple Top)SARS-COV-2 Vaccination 03/24/2019, 04/13/2019, 11/29/2019  . Zoster Recombinat (Shingrix) 08/01/2017, 10/10/2017      Objective: Vital Signs: BP (!) 95/59 (BP Location: Left Arm, Patient Position: Standing, Cuff Size: Normal)   Pulse 84   Resp 12   Ht 5\' 4"  (1.626 m)   Wt 120 lb 3.2 oz (54.5 kg)   BMI 20.63 kg/m    Physical Exam Vitals and nursing note reviewed.  Constitutional:      Appearance: She is well-developed and well-nourished.  HENT:     Head: Normocephalic and atraumatic.  Eyes:     Extraocular Movements: EOM normal.     Conjunctiva/sclera: Conjunctivae normal.  Cardiovascular:     Rate and Rhythm: Normal rate and regular rhythm.     Pulses: Intact distal pulses.     Heart sounds: Normal heart sounds.  Pulmonary:     Effort: Pulmonary effort is normal.     Breath sounds: Normal breath sounds.  Abdominal:     General: Bowel sounds are normal.     Palpations: Abdomen is soft.  Musculoskeletal:     Cervical back: Normal range of motion.  Lymphadenopathy:     Cervical: No cervical adenopathy.  Skin:    General: Skin is warm and dry.     Capillary Refill: Capillary refill takes less than 2 seconds.  Neurological:     Mental Status: She is alert and oriented to person, place, and time.  Psychiatric:        Mood and  Affect: Mood and affect normal.        Behavior: Behavior normal.      Musculoskeletal Exam: C-spine was in good range of motion.  Shoulder joints, elbow joints, wrist joints with good range of motion.  She has some discomfort range of motion of her left shoulder joint.  PIP and DIP thickening was noted.  No synovitis was noted.  Hip joints and knee joints with good range of motion.  There was no tenderness over ankles or MTPs.  CDAI Exam: CDAI Score: -- Patient Global: --; Provider Global: -- Swollen: --; Tender: -- Joint Exam 03/21/2020   No joint exam has been documented for this visit   There is currently no information documented on the homunculus. Go to the Rheumatology activity and complete the homunculus joint exam.  Investigation: No additional  findings.  Imaging: No results found.  Recent Labs: Lab Results  Component Value Date   WBC 4.8 11/28/2019   HGB 11.1 (L) 11/28/2019   PLT 179 11/28/2019   NA 139 11/28/2019   K 4.3 11/28/2019   CL 102 11/28/2019   CO2 29 11/28/2019   GLUCOSE 86 11/28/2019   BUN 21 11/28/2019   CREATININE 1.24 (H) 11/28/2019   BILITOT 1.4 (H) 11/28/2019   AST 24 11/28/2019   ALT 17 11/28/2019   PROT 6.9 11/28/2019   CALCIUM 10.0 11/28/2019   GFRAA 47 (L) 11/28/2019    Speciality Comments: PLQ eye exam: 10/16/2019 normal. Dr. Gershon Crane. Follow up in 1 year.  Procedures:  No procedures performed Allergies: Patient has no known allergies.     Assessment / Plan:     Visit Diagnoses: PMR (polymyalgia rheumatica) (HCC)-patient has no increased muscular weakness or tenderness.  High risk medication use - Plaquenil 200 mg p.o. daily. PLQ eye exam: 10/16/2019 - Plan: CBC with Differential/Platelet, COMPLETE METABOLIC PANEL WITH GFR today and then every 5 months to monitor for drug toxicity.  Patient brought records from the nephrologist office which were reviewed.  Her creatinine has decreased in the last 2 years.  Although its been stable in the last 18 months.  She was evaluated by the nephrologist who felt is related to the hypertension.  She has been followed by Dr. Einar Gip.  Chronic left shoulder pain-she is going to physical therapy.  History of repair of right rotator cuff-chronic discomfort.  Primary osteoarthritis of both hands-joint protection muscle strengthening was discussed.  Primary osteoarthritis of both feet-proper fitting shoes were discussed.  DDD (degenerative disc disease), cervical-she had good range of motion in her cervical spine.  DDD (degenerative disc disease), lumbar-she continues to have some lower back pain.  Age-related osteoporosis without current pathological fracture-her last bone density was on October 10, 2018.  Left femoral T score was -2.3, BMD 0.591.  She has  been on Prolia by Dr. Forde Dandy.  Use of calcium, vitamin D and exercises was emphasized.  Chronic kidney disease, unspecified CKD stage-she is followed by nephrology.  Her creatinine has been stable in the last 18 months.  Other headache syndrome-followed by Dr. Brett Fairy.  History of hypertension-followed by Dr. Einar Gip.  Have advised her to schedule an appointment with Dr. Einar Gip as her blood pressure has been fluctuating.  Today she was hypotensive.  She also complains of dizziness when she gets up from the chair.  Night terrors, adult-followed by Dr. Brett Fairy.  History of gastroesophageal reflux (GERD)  Anxiety and depression-patient states she has not been very happy in the last few months.  And feels  very anxious about her health.  She is also thinking about moving into a retirement community.  Orders: Orders Placed This Encounter  Procedures  . CBC with Differential/Platelet  . COMPLETE METABOLIC PANEL WITH GFR   No orders of the defined types were placed in this encounter.  Time spent with patient was 45 minutes.  Over 50% time was spent in counseling and coordination of care.  Follow-Up Instructions: Return in about 5 months (around 08/19/2020) for Polymyalgia rheumatica.   Bo Merino, MD  Note - This record has been created using Editor, commissioning.  Chart creation errors have been sought, but may not always  have been located. Such creation errors do not reflect on  the standard of medical care.

## 2020-03-21 NOTE — Patient Instructions (Signed)
Standing Labs We placed an order today for your standing lab work.   Please have your standing labs drawn in June  If possible, please have your labs drawn 2 weeks prior to your appointment so that the provider can discuss your results at your appointment.  We have open lab daily Monday through Thursday from 8:30-12:30 PM and 1:30-4:30 PM and Friday from 8:30-12:30 PM and 1:30-4:00 PM at the office of Dr. Bo Merino, [facility].   Please be advised, patients with office appointments requiring lab work will take precedents over walk-in lab work.  If possible, please come for your lab work on Monday and Friday afternoons, as you may experience shorter wait times. The office is located at [address], [place], [place], [place] [address] No appointment is necessary.   Labs are drawn by [facility]. Please bring your co-pay at the time of your lab draw.  You may receive a bill from [facility] for your lab work.  If you wish to have your labs drawn at another location, please call the office 24 hours in advance to send orders.  If you have any questions regarding directions or hours of operation,  please call [phone].   As a reminder, please drink plenty of water prior to coming for your lab work. Thanks!

## 2020-03-22 LAB — CBC WITH DIFFERENTIAL/PLATELET
Absolute Monocytes: 441 cells/uL (ref 200–950)
Basophils Absolute: 29 cells/uL (ref 0–200)
Basophils Relative: 0.6 %
Eosinophils Absolute: 98 cells/uL (ref 15–500)
Eosinophils Relative: 2 %
HCT: 33.5 % — ABNORMAL LOW (ref 35.0–45.0)
Hemoglobin: 11.4 g/dL — ABNORMAL LOW (ref 11.7–15.5)
Lymphs Abs: 1078 cells/uL (ref 850–3900)
MCH: 33.5 pg — ABNORMAL HIGH (ref 27.0–33.0)
MCHC: 34 g/dL (ref 32.0–36.0)
MCV: 98.5 fL (ref 80.0–100.0)
MPV: 10.6 fL (ref 7.5–12.5)
Monocytes Relative: 9 %
Neutro Abs: 3254 cells/uL (ref 1500–7800)
Neutrophils Relative %: 66.4 %
Platelets: 186 10*3/uL (ref 140–400)
RBC: 3.4 10*6/uL — ABNORMAL LOW (ref 3.80–5.10)
RDW: 12.1 % (ref 11.0–15.0)
Total Lymphocyte: 22 %
WBC: 4.9 10*3/uL (ref 3.8–10.8)

## 2020-03-22 LAB — COMPLETE METABOLIC PANEL WITH GFR
AG Ratio: 2 (calc) (ref 1.0–2.5)
ALT: 18 U/L (ref 6–29)
AST: 25 U/L (ref 10–35)
Albumin: 4.5 g/dL (ref 3.6–5.1)
Alkaline phosphatase (APISO): 40 U/L (ref 37–153)
BUN/Creatinine Ratio: 25 (calc) — ABNORMAL HIGH (ref 6–22)
BUN: 27 mg/dL — ABNORMAL HIGH (ref 7–25)
CO2: 29 mmol/L (ref 20–32)
Calcium: 9.7 mg/dL (ref 8.6–10.4)
Chloride: 103 mmol/L (ref 98–110)
Creat: 1.09 mg/dL — ABNORMAL HIGH (ref 0.60–0.88)
GFR, Est African American: 54 mL/min/{1.73_m2} — ABNORMAL LOW (ref >=60)
GFR, Est Non African American: 47 mL/min/{1.73_m2} — ABNORMAL LOW (ref >=60)
Globulin: 2.3 g/dL (calc) (ref 1.9–3.7)
Glucose, Bld: 94 mg/dL (ref 65–99)
Potassium: 4.5 mmol/L (ref 3.5–5.3)
Sodium: 139 mmol/L (ref 135–146)
Total Bilirubin: 1.2 mg/dL (ref 0.2–1.2)
Total Protein: 6.8 g/dL (ref 6.1–8.1)

## 2020-03-23 NOTE — Progress Notes (Signed)
Hemoglobin is low and stable.  Creatinine and GFR have improved.  Patient should avoid all NSAIDs.  Please forward results to her PCP and nephrologist.

## 2020-03-25 ENCOUNTER — Encounter: Payer: Self-pay | Admitting: Physical Therapy

## 2020-03-25 ENCOUNTER — Ambulatory Visit: Payer: Medicare HMO | Attending: Rheumatology | Admitting: Physical Therapy

## 2020-03-25 ENCOUNTER — Other Ambulatory Visit: Payer: Self-pay

## 2020-03-25 DIAGNOSIS — M6281 Muscle weakness (generalized): Secondary | ICD-10-CM | POA: Insufficient documentation

## 2020-03-25 DIAGNOSIS — M25512 Pain in left shoulder: Secondary | ICD-10-CM | POA: Insufficient documentation

## 2020-03-25 DIAGNOSIS — R279 Unspecified lack of coordination: Secondary | ICD-10-CM | POA: Insufficient documentation

## 2020-03-25 DIAGNOSIS — G8929 Other chronic pain: Secondary | ICD-10-CM | POA: Diagnosis not present

## 2020-03-25 DIAGNOSIS — R293 Abnormal posture: Secondary | ICD-10-CM | POA: Diagnosis not present

## 2020-03-25 NOTE — Therapy (Signed)
[facility] [address] [place], [place], [address] Phone: [phone]   Fax:  [phone]  Physical Therapy Treatment  Patient Details  Name: Gloria Anderson MRN: [id number] Date of Birth: Sep 17, 1933 Referring Provider (PT): Bo Merino, MD   Encounter Date: 03/25/2020   PT End of Session - 03/25/20 1357    Visit Number 2    Date for PT Re-Evaluation 05/13/20    Authorization Type [employer] Medicare    PT Start Time 1400    PT Stop Time 1441    PT Time Calculation (min) 41 min    Activity Tolerance Patient tolerated treatment well    Behavior During Therapy [facility] for tasks assessed/performed           Past Medical History:  Diagnosis Date  . Arthritis   . Complication of anesthesia   . Constipation   . Depression   . GERD (gastroesophageal reflux disease)   . Hypertension   . Hypothyroidism   . Osteoporosis   . Pneumonia 2018  . Polymyalgia ([place])   . Polymyalgia rheumatica ([place])   . PONV (postoperative nausea and vomiting)     Past Surgical History:  Procedure Laterality Date  . COLONOSCOPY W/ POLYPECTOMY    . EYE SURGERY     both cataracts  . LUMBAR LAMINECTOMY/DECOMPRESSION MICRODISCECTOMY N/A 08/15/2018   Procedure: Lumbar three to Lumbar five Decompressive lumbar laminectomy;  Surgeon: Erline Levine, MD;  Location: [facility];  Service: Neurosurgery;  Laterality: N/A;  . NASAL SINUS SURGERY    . SHOULDER SURGERY Right 2007   repair  . TONSILLECTOMY    . TRIGGER FINGER RELEASE  03/19/2011   Procedure:  left middle finger RELEASE TRIGGER FINGER/A-1 PULLEY;  Surgeon: Cammie Sickle., MD;  Location: [facility];  Service: Orthopedics;  Laterality: Right;  Procedure:  Release Right Long and Ring Trigger Fingers, Release Left Long Trigger Finger, Injection Left Long Proximal Phalangeal Joint  . TRIGGER FINGER RELEASE Right    Ring finger, middle finger  . UPPER GASTROINTESTINAL  ENDOSCOPY      There were no vitals filed for this visit.   Subjective Assessment - 03/25/20 1401    Subjective I am doing the balance exercises at the kitchen counter.  I hate the tandem exercise.  I'm not sure I'm improving yet.  No pain today in Lt shoulder but yesterday some pain.  I have had trouble with my blood pressure lately and had to miss an appointment.  I am taking meds for this.    Pertinent History lumbar laminectomy 2021, Rt shoulder surgery    Patient Stated Goals LE strength, balance, Lt shoulder use for functional tasks    Currently in Pain? No/denies                             [facility] Adult PT Treatment/Exercise - 03/25/20 0001      Exercises   Exercises Shoulder;Lumbar;Knee/Hip      Lumbar Exercises: Aerobic   Nustep L2 x 5' PT present to discuss symtoms      Knee/Hip Exercises: Standing   Heel Raises Both;20 reps    Heel Raises Limitations with toe raises    Hip Abduction Stengthening;Both;2 sets;10 reps;Knee straight    Abduction Limitations at counter    Hip Extension Stengthening;Both;2 sets;10 reps;Knee straight    Extension Limitations at counter    Functional Squat 2 sets;10 reps  Functional Squat Limitations at counter    SLS with light UE support bil 3x15 each leg      Shoulder Exercises: Standing   External Rotation Strengthening;Both;Theraband;20 reps    Theraband Level (Shoulder External Rotation) Level 2 (Red)    Flexion Limitations doorway slides Lt shoulder x 10 reps, then hands on table walkbacks 2x10 sec    Extension Strengthening;20 reps;Theraband;Both    Theraband Level (Shoulder Extension) Level 2 (Red)    Row Strengthening;Both;Theraband;20 reps                    PT Short Term Goals - 03/25/20 1444      PT SHORT TERM GOAL #1   Title Pt will be ind with initial HEP    Status On-going      PT SHORT TERM GOAL #2   Title Pt will report at least 25% less pain with functional use of Lt UE for ADLs and  light household tasks.    Status On-going      PT SHORT TERM GOAL #3   Title Pt will demo proper hip and knee alignment with deep squat to reach floor item.    Status On-going             PT Long Term Goals - 02/19/20 1335      PT LONG TERM GOAL #1   Title Pt will be ind with advanced HEP and understand how to safely progress    Time 12    Period Weeks    Status New    Target Date 05/13/20      PT LONG TERM GOAL #2   Title Improve FOTO to </= 35% to demo less limitation of Lt shoulder.    Baseline 45%    Time 12    Period Weeks    Status New    Target Date 05/13/20      PT LONG TERM GOAL #3   Title Pt will achieve at least 4+/5 strength in scapular stabilizers and Lt shoulder for improved functional use with less pain.    Baseline -    Time 12    Period Weeks    Status New    Target Date 05/13/20      PT LONG TERM GOAL #4   Title Pt will improve functional strength of bil LEs to allow her to rise from floor sitting.    Baseline -    Time 12    Period Weeks    Status New    Target Date 05/13/20      PT LONG TERM GOAL #5   Title Pt will improve BERG score to at least 52/56 to demo reduced fall risk with ability to perform SLS for at least 10 sec on each leg    Time 12    Period Weeks    Status New    Target Date 05/13/20                 Plan - 03/25/20 1441    Clinical Impression Statement Pt returns for first follow up visit since evaluation.  PT progressed HEP for Lt shoulder ROM and strength and LE strength with good tolerance.  All LE strength is to be done at counter with UE support due to fall risk.  Pt will likely need higher resistance band from red next visit for UE ther ex.  She will benefit from more balance challenges next time but today was used to develop safe comprehensive  HEP for LE strength and Lt shoulder.    PT Frequency 1x / week    PT Duration 12 weeks    PT Treatment/Interventions ADLs/Self Care Home Management;Electrical  Stimulation;Cryotherapy;Moist Heat;Balance training;Neuromuscular re-education;Therapeutic exercise;Therapeutic activities;Functional mobility training;Gait training;Patient/family education;Manual techniques;Dry needling;Passive range of motion    PT Next Visit Plan f/u on HEP, progress dynamic balance challenges    PT Home Exercise Plan Access Code: [email or web address] (counter SLS and tandem stance)    Consulted and Agree with Plan of Care Patient           Patient will benefit from skilled therapeutic intervention in order to improve the following deficits and impairments:     Visit Diagnosis: Chronic left shoulder pain  Muscle weakness (generalized)  Unspecified lack of coordination  Abnormal posture     Problem List Patient Active Problem List   Diagnosis Date Noted  . Sleep walking 05/23/2019  . Cognitive complaints with normal exam 05/23/2019  . Cerebral arteriovenous malformation (AVM) 05/23/2019  . 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 Nocturnal seizures ([place]) 05/23/2019  . Adult night terrors 01/09/2019  . Degenerative lumbar spinal stenosis 08/15/2018  . Memory difficulty 07/26/2017  . Other parasomnia 07/26/2017  . Anemia 06/02/2017  . Osteopenia 06/02/2017  . Spinal stenosis 06/02/2017  . Iron deficiency anemia secondary to inadequate dietary iron intake 08/05/2016  . Fatigue associated with anemia 08/05/2016  . Primary osteoarthritis of both feet 02/04/2016  . Essential hypertension 02/04/2016  . PMR (polymyalgia rheumatica) (HCC) 02/03/2016  . Osteoarthritis, hand 02/03/2016  . Age-related osteoporosis without current pathological fracture 02/03/2016  . DDD (degenerative disc disease), lumbar 02/03/2016  . High risk medication use 02/03/2016  . 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 Cerebral cavernous malformation type 1 12/04/2013  . Night terrors, adult 09/26/2013  .  HEMORRHOIDS-EXTERNAL 09/19/2009  . GERD 09/19/2009  . CONSTIPATION 09/19/2009  . DYSPHAGIA 09/19/2009  . PERSONAL HISTORY OF COLONIC POLYPS 09/19/2009    Venetia Night E [place] 03/25/2020, 2:44 PM   [facility] [address] [place], [place], [address] Phone: [phone]   Fax:  [phone]  Name: Gloria Anderson MRN: [id number] Date of Birth: 1933-11-29

## 2020-03-28 ENCOUNTER — Encounter: Payer: Self-pay | Admitting: Cardiology

## 2020-03-28 ENCOUNTER — Ambulatory Visit: Payer: Medicare HMO | Admitting: Cardiology

## 2020-03-28 ENCOUNTER — Other Ambulatory Visit: Payer: Self-pay

## 2020-03-28 VITALS — BP 156/81 | HR 92 | Temp 98.2°F | Ht 64.0 in | Wt 118.0 lb

## 2020-03-28 DIAGNOSIS — N1831 Chronic kidney disease, stage 3a: Secondary | ICD-10-CM | POA: Diagnosis not present

## 2020-03-28 DIAGNOSIS — I10 Essential (primary) hypertension: Secondary | ICD-10-CM

## 2020-03-28 DIAGNOSIS — I951 Orthostatic hypotension: Secondary | ICD-10-CM | POA: Diagnosis not present

## 2020-03-28 NOTE — Progress Notes (Signed)
Primary Physician/Referring:  Adrian PrinceSouth, Stephen, MD  Patient ID: Gloria SalterJoanne Zambrana, female    DOB: 10/18/1933, 87 y.o.   MRN: [id number]  Chief Complaint  Patient presents with  . Orthostatic hypotension   HPI:    Gloria Anderson  is a 87 y.o. 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  She is extremely sensitive to blood pressure variations.  Generally she has been tolerating carvedilol well without dizziness and fairly good control of hypertension.  She also has hydralazine to use on a as needed basis.  She is also enrolled in   in Remote Patient Monitoring and Principal Care Management as patient is high risk for hospitalization and complications from underlying medical conditions.   She did not notice any significant change in dizziness with support stocking and stopped using them. She does sleep on a wedge at night due to orthostatic hypertension. Patient states that on the present medical regimen, symptoms of dizziness are very stable. She denies any chest pain or shortness of breath. Continues to remain active.  Past Medical History:  Diagnosis Date  . Arthritis   . Complication of anesthesia   . Constipation   . Depression   . GERD (gastroesophageal reflux disease)   . Hypertension   . Hypothyroidism   . Osteoporosis   . Pneumonia 2018  . Polymyalgia (HCC)   . Polymyalgia rheumatica (HCC)   . PONV (postoperative nausea and vomiting)    Past Surgical History:  Procedure Laterality Date  . COLONOSCOPY W/ POLYPECTOMY    . EYE SURGERY     both cataracts  . LUMBAR LAMINECTOMY/DECOMPRESSION MICRODISCECTOMY N/A 08/15/2018   Procedure: Lumbar three to Lumbar five Decompressive lumbar laminectomy;  Surgeon: Maeola HarmanStern, Joseph, MD;  [facility];  Service: Neurosurgery;  Laterality: N/A;  . NASAL SINUS SURGERY    . SHOULDER SURGERY Right 2007    repair  . TONSILLECTOMY    . TRIGGER FINGER RELEASE  03/19/2011   Procedure:  left middle finger RELEASE TRIGGER FINGER/A-1 PULLEY;  Surgeon: Wyn Forsterobert V Sypher Jr., MD;  Location: [facility];  Service: Orthopedics;  Laterality: Right;  Procedure:  Release Right Long and Ring Trigger Fingers, Release Left Long Trigger Finger, Injection Left Long Proximal Phalangeal Joint  . TRIGGER FINGER RELEASE Right    Ring finger, middle finger  . UPPER GASTROINTESTINAL ENDOSCOPY     Social History   Tobacco Use  . Smoking status: Never Smoker  . Smokeless tobacco: Never Used  Substance Use Topics  . Alcohol use: No    Alcohol/week: 0.0 standard drinks   ROS  Review of Systems  Cardiovascular: Negative for chest pain, dyspnea on exertion and leg swelling.  Gastrointestinal: Negative for melena.  Neurological: Positive for dizziness.  Psychiatric/Behavioral: The patient is nervous/anxious (about health).    Objective  Blood pressure (!) 156/81, pulse 92, temperature 98.2 F (36.8 C), height 5\' 4"  (1.626 m), weight 118 lb (53.5 kg), SpO2 100 %.  Vitals with BMI 03/28/2020 03/28/2020 03/21/2020  Height - 5\' 4"  5\' 4"   Weight - 118 lbs 120 lbs 3 oz  BMI - 20.24 20.62  Systolic 156 186 95  Diastolic 81 89 59  Pulse 92 90 84    Orthostatic VS for the past 72 hrs (Last 3 readings):  Patient Position BP Location Cuff Size  03/28/20 1425 Standing Left Arm Normal  03/28/20 1421 Sitting Left Arm Normal  Physical Exam Vitals reviewed.  Constitutional:      Appearance: Normal appearance. She is well-developed.  Cardiovascular:     Rate and Rhythm: Normal rate and regular rhythm.     Pulses: Intact distal pulses.          Carotid pulses are on the right side with bruit and on the left side with bruit.      Femoral pulses are on the right side with bruit and on the left side with bruit.    Heart sounds: Murmur heard.   Crescendo-decrescendo mid to late systolic murmur is present with a  grade of 2/6 at the apex.   Pulmonary:     Effort: Pulmonary effort is normal. No accessory muscle usage or respiratory distress.     Breath sounds: Normal breath sounds.  Abdominal:     General: Abdomen is flat. Bowel sounds are normal. There is abdominal bruit.     Palpations: Abdomen is soft.  Neurological:     General: No focal deficit present.     Mental Status: She is alert and oriented to person, place, and time.    Laboratory examination:   Recent Labs    11/28/19 1511 03/21/20 1223  NA 139 139  K 4.3 4.5  CL 102 103  CO2 29 29  GLUCOSE 86 94  BUN 21 27*  CREATININE 1.24* 1.09*  CALCIUM 10.0 9.7  GFRNONAA 40* 47*  GFRAA 47* 54*   estimated creatinine clearance is 32.4 mL/min (A) (by C-G formula based on SCr of 1.09 mg/dL (H)).  CMP Latest Ref Rng & Units 03/21/2020 11/28/2019 08/11/2018  Glucose 65 - 99 mg/dL 94 86 110(H)  BUN 7 - 25 mg/dL 27(H) 21 22  Creatinine 0.60 - 0.88 mg/dL 1.09(H) 1.24(H) 1.26(H)  Sodium 135 - 146 mmol/L 139 139 140  Potassium 3.5 - 5.3 mmol/L 4.5 4.3 4.6  Chloride 98 - 110 mmol/L 103 102 105  CO2 20 - 32 mmol/L 29 29 25   Calcium 8.6 - 10.4 mg/dL 9.7 10.0 9.5  Total Protein 6.1 - 8.1 g/dL 6.8 6.9 -  Total Bilirubin 0.2 - 1.2 mg/dL 1.2 1.4(H) -  AST 10 - 35 U/L 25 24 -  ALT 6 - 29 U/L 18 17 -   CBC Latest Ref Rng & Units 03/21/2020 11/28/2019 08/11/2018  WBC 3.8 - 10.8 Thousand/uL 4.9 4.8 5.3  Hemoglobin 11.7 - 15.5 g/dL 11.4(L) 11.1(L) 11.6(L)  Hematocrit 35.0 - 45.0 % 33.5(L) 32.3(L) 34.6(L)  Platelets 140 - 400 Thousand/uL 186 179 183    External labs:  Cholesterol, total 183.000 m 05/30/2019 HDL 80 MG/DL 05/30/2019 LDL 93.000 mg 05/30/2019 Triglycerides 52.000 05/30/2019  A1C 5.000 % 03/12/2019 TSH 1.980 03/13/2019  Hemoglobin 11.800 g/ 03/12/2019  Creatinine, Serum 1.200 mg/ 04/16/2019 Potassium 4.600 08/11/2018 ALT (SGPT) 20.000 uni 05/30/2019   Medications and allergies  No Known Allergies   Current Outpatient Medications  on File Prior to Visit  Medication Sig Dispense Refill  . acetaminophen (TYLENOL) 325 MG tablet Take 650 mg by mouth every 6 (six) hours as needed.    Marland Kitchen Apoaequorin 10 MG CAPS Take 10 mg by mouth daily.    Marland Kitchen atorvastatin (LIPITOR) 10 MG tablet Take 10 mg by mouth daily.     . Calcium Citrate (CITRACAL PO) Take 1,300 mg by mouth daily. Taking it in the AM    . carvedilol (COREG) 3.125 MG tablet TAKE 1 TABLET(3.125 MG) BY MOUTH FOUR TIMES DAILY (Patient taking differently: Take 3.125 mg by  mouth as needed. TAKE 1 TABLET(3.125 MG) BY MOUTH TWICE TIMES DAILY (when standing SBP >100)) 360 tablet 3  . clonazePAM (KLONOPIN) 0.5 MG tablet Take 1 tablet (0.5 mg total) by mouth at bedtime. 90 tablet 1  . denosumab (PROLIA) 60 MG/ML SOSY injection Inject 60 mg into the skin every 6 (six) months.    . fish oil-omega-3 fatty acids 1000 MG capsule Take 1 g by mouth daily.     . fluocinonide (LIDEX) 0.05 % external solution Apply 1 application topically 2 (two) times daily.    . Garlic 9381 MG TBEC Take 1,000 mg by mouth daily. [place]    . hydrALAZINE (APRESOLINE) 25 MG tablet TAKE 1 TABLET BY MOUTH THREE TIMES DAILY AS NEEDED FOR SYSTOLIC BLOOD PRESSURE GREATER THAN 150 270 tablet 3  . hydroxychloroquine (PLAQUENIL) 200 MG tablet TAKE 1 TABLET BY MOUTH EVERY DAY 90 tablet 0  . Iron-Vitamins (GERITOL PO) Take 1 tablet by mouth daily.     Marland Kitchen levothyroxine (SYNTHROID, LEVOTHROID) 50 MCG tablet Take 50 mcg by mouth daily before breakfast.     . Lifitegrast (XIIDRA OP) Apply 1 drop to eye 2 (two) times daily.    Marland Kitchen omeprazole (PRILOSEC) 20 MG capsule Take 20 mg by mouth daily.    . polyethylene glycol (MIRALAX / GLYCOLAX) packet Take 17 g by mouth daily as needed for moderate constipation.     . Probiotic Product (PROBIOTIC DAILY PO) Take 1 tablet by mouth daily. Culturelle    . Propylene Glycol (SYSTANE BALANCE OP) Apply 1 drop to eye in the morning and at bedtime.     No current facility-administered  medications on file prior to visit.     Radiology:  No results found.  Cardiac Studies:   Exercise Treadmill Stress Test 10/07/2017:  Indication: chest pain The patient exercised on Bruce protocol for  06:19 min. Patient achieved  7.40 METS and reached HR  114 bpm, which is  82 % of maximum age-predicted HR.  Stress test terminated due to fatigue.   Exercise capacity was fair for age. HR Response to Exercise: Appropriate. BP Response to Exercise: Normal resting BP- appropriate response. Chest Pain: none. Arrhythmias: Occasional PVC s. Resting EKG demonstrates Normal sinus rhythm. ST Changes: With peak exercise there was no ST-T changes of ischemia.  Overall Impression:  Submaximal stress test with no ischemic changes. Continue primary/secondary prevention.  Echocardiogram 12/13/2018: Normal LV systolic function with EF 67%. Left ventricle cavity is normal in size. Normal left ventricular wall thickness. Normal global wall motion. Diastolic function could not be assessed due to severity of mitral regurgitation.  Moderate (Grade II) mitral regurgitation. Moderate tricuspid regurgitation. Moderate pulmonary hypertension. Estimated pulmonary artery systolic pressure is 50 mmHg.  Small circumferential pericardial effusion wth no hemodynamic compromise.  IVC is dilated with a respiratory response of <50%. Estimated RA pressure 10-15 mmHg. No significant change compared to previous study on 05/09/2018.  Carotid artery duplex  07/04/2019: No hemodynamically significant arterial disease in the internal carotid artery bilaterally. Antegrade right vertebral artery flow. Antegrade left vertebral artery flow.  EKG:    EKG 12/24/2019: Normal sinus rhythm with rate of 81 bpm, normal axis.  Poor R progression, probably normal variant.  Low-voltage complexes.  No significant change from 06/22/2019.  Assessment     ICD-10-CM   1. Essential hypertension  I10   2. Orthostatic  hypotension  I95.1   3. Stage 3a chronic kidney disease (HCC)  N18.31     No orders  of the defined types were placed in this encounter.   Medications Discontinued During This Encounter  Medication Reason  . clonazePAM (KLONOPIN) 0.5 MG tablet Error    Recommendations:   Jamanda Holgerson  is a 87 y.o. 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  She is extremely sensitive to blood pressure variations.  Generally she has been tolerating carvedilol well without dizziness and fairly good control of hypertension.  She also has hydralazine to use on a as needed basis.  She is also enrolled in   in Remote Patient Monitoring and Principal Care Management as patient is high risk for hospitalization and complications from underlying medical conditions.  I have again discussed orthostatic hypotension and supine hypertension again with the patient. Overall stable and although her BP is elevated, home BP recordings are very stable and good with occasional drop to low 90s.  Will continue to treat her standing blood pressure and not supine or sitting BP.  She has been compliant with using support stockings and also has been sleeping on a wedge at night to reduce the risk from supine hypertension.  She has been responding well to a combination of carvedilol and hydralazine with supine blood pressure >150 mmHg which brings it down nicely to around 130 to 140 mmHg.  Valvular heart disease has been stable and there is no clinical evidence of heart failure. I do not think she needs repeat echocardiogram. She continues to remain active.  I reviewed her external labs, renal function is remained stable, I discussed with her regarding chronic renal insufficiency again and reassured her that it has been stable for at least 3-4 years, she also saw Dr. Harrie Jeans. I will see the patient  back in 6 months.     Adrian Prows, MD, [facility] 03/30/2020, 8:06 AM Office: [phone] Pager: [phone]   CC: Harrie Jeans, MD

## 2020-03-30 ENCOUNTER — Encounter: Payer: Self-pay | Admitting: Cardiology

## 2020-04-01 ENCOUNTER — Encounter: Payer: Self-pay | Admitting: Physical Therapy

## 2020-04-01 ENCOUNTER — Other Ambulatory Visit: Payer: Self-pay

## 2020-04-01 ENCOUNTER — Ambulatory Visit: Payer: Medicare HMO | Attending: Rheumatology | Admitting: Physical Therapy

## 2020-04-01 DIAGNOSIS — M6281 Muscle weakness (generalized): Secondary | ICD-10-CM | POA: Insufficient documentation

## 2020-04-01 DIAGNOSIS — R293 Abnormal posture: Secondary | ICD-10-CM | POA: Insufficient documentation

## 2020-04-01 DIAGNOSIS — M25512 Pain in left shoulder: Secondary | ICD-10-CM | POA: Diagnosis not present

## 2020-04-01 DIAGNOSIS — G8929 Other chronic pain: Secondary | ICD-10-CM | POA: Diagnosis not present

## 2020-04-01 NOTE — Therapy (Signed)
[facility] [address], [place], [place], [address] Phone: [phone]   Fax:  [phone]  Physical Therapy Treatment  Patient Details  Name: Gloria Anderson MRN: [id number] Date of Birth: 11/09/1933 Referring Provider (PT): Bo Merino, MD   Encounter Date: 04/01/2020   PT End of Session - 04/01/20 1400    Visit Number 3    Date for PT Re-Evaluation 05/13/20    Authorization Type [employer]    Progress Note Due on Visit 10    PT Start Time 1400    PT Stop Time 1445    PT Time Calculation (min) 45 min    Activity Tolerance Patient tolerated treatment well    Behavior During Therapy [facility] for tasks assessed/performed           Past Medical History:  Diagnosis Date  . Arthritis   . Complication of anesthesia   . Constipation   . Depression   . GERD (gastroesophageal reflux disease)   . Hypertension   . Hypothyroidism   . Osteoporosis   . Pneumonia 2018  . Polymyalgia ([place])   . Polymyalgia rheumatica ([place])   . PONV (postoperative nausea and vomiting)     Past Surgical History:  Procedure Laterality Date  . COLONOSCOPY W/ POLYPECTOMY    . EYE SURGERY     both cataracts  . LUMBAR LAMINECTOMY/DECOMPRESSION MICRODISCECTOMY N/A 08/15/2018   Procedure: Lumbar three to Lumbar five Decompressive lumbar laminectomy;  Surgeon: Erline Levine, MD;  Location: [place];  Service: Neurosurgery;  Laterality: N/A;  . NASAL SINUS SURGERY    . SHOULDER SURGERY Right 2007   repair  . TONSILLECTOMY    . TRIGGER FINGER RELEASE  03/19/2011   Procedure:  left middle finger RELEASE TRIGGER FINGER/A-1 PULLEY;  Surgeon: Cammie Sickle., MD;  Location: [place];  Service: Orthopedics;  Laterality: Right;  Procedure:  Release Right Long and Ring Trigger Fingers, Release Left Long Trigger Finger, Injection Left Long Proximal Phalangeal Joint  . TRIGGER FINGER RELEASE Right    Ring finger, middle  finger  . UPPER GASTROINTESTINAL ENDOSCOPY      There were no vitals filed for this visit.   Subjective Assessment - 04/01/20 1401    Subjective I get a popping in the Lt shoulder when I do the wall slide and it makes me wonder if I should be doing it.  I already feel stronger in my legs with the HEP especially with the counter squats.    Pertinent History lumbar laminectomy 2021, Rt shoulder surgery    Limitations Lifting;House hold activities;Other (comment)    Patient Stated Goals LE strength, balance, Lt shoulder use for functional tasks    Currently in Pain? No/denies                             [facility] Adult PT Treatment/Exercise - 04/01/20 0001      Exercises   Exercises Lumbar;Knee/Hip;Shoulder      Lumbar Exercises: Aerobic   Nustep L4 x 5' PT present to discuss status and plan for today      Lumbar Exercises: Standing   Other Standing Lumbar Exercises standing in narrow BOS ball handoff Lt/Rt each x 5, Pt slight dizziness with this      Knee/Hip Exercises: Standing   Heel Raises Both;20 reps    Heel Raises Limitations with toe raises    Rocker Board 2 minutes  Rebounder 1' weight shifting with march stagger stance and square stance each    Gait Training with ladder sit to stands 1 reps up to 5 reps with 5 steps fwd/bwd each cycle, pool noodle step overs with gait x 5 rounds small/med/large noodle    Other Standing Knee Exercises side step over medium pool noodle x 10 reps each to narrow BOS      Knee/Hip Exercises: Seated   Sit to Sand 5 reps;without UE support      Shoulder Exercises: Standing   Extension Strengthening;20 reps;Theraband;Both    Theraband Level (Shoulder Extension) Level 2 (Red)    Row Strengthening;Both;Theraband;20 reps    Theraband Level (Shoulder Row) Level 2 (Red)                    PT Short Term Goals - 04/01/20 1714      PT SHORT TERM GOAL #1   Title Pt will be ind with initial HEP    Status Achieved       PT SHORT TERM GOAL #2   Title Pt will report at least 25% less pain with functional use of Lt UE for ADLs and light household tasks.    Status On-going      PT SHORT TERM GOAL #3   Title Pt will demo proper hip and knee alignment with deep squat to reach floor item.    Status Achieved             PT Long Term Goals - 02/19/20 1335      PT LONG TERM GOAL #1   Title Pt will be ind with advanced HEP and understand how to safely progress    Time 12    Period Weeks    Status New    Target Date 05/13/20      PT LONG TERM GOAL #2   Title Improve FOTO to </= 35% to demo less limitation of Lt shoulder.    Baseline 45%    Time 12    Period Weeks    Status New    Target Date 05/13/20      PT LONG TERM GOAL #3   Title Pt will achieve at least 4+/5 strength in scapular stabilizers and Lt shoulder for improved functional use with less pain.    Baseline -    Time 12    Period Weeks    Status New    Target Date 05/13/20      PT LONG TERM GOAL #4   Title Pt will improve functional strength of bil LEs to allow her to rise from floor sitting.    Baseline -    Time 12    Period Weeks    Status New    Target Date 05/13/20      PT LONG TERM GOAL #5   Title Pt will improve BERG score to at least 52/56 to demo reduced fall risk with ability to perform SLS for at least 10 sec on each leg    Time 12    Period Weeks    Status New    Target Date 05/13/20                 Plan - 04/01/20 1709    Clinical Impression Statement Pt reports she has been more than compliant with HEP, doing more than asked of her within her current program with good tolerance.  She states she feels stronger already especially with return to stand from squat position.  She has had 2 short-lived bouts of Lt shoulder pain since last visit.  She was eager to work on balance so PT utilized various gait training challenges with need for cueing for heel strike with large foam roller step over and intial cueing for  task performance for understanding.  She had several lateral balance losses within sessioni so PT using close supervision for safety.  Pt demos less or no use of UEs for sit to stand.  She gets mildly dizzy when performing trunk rotation ball hand offs so need to limit reps with this.  Continue along POC.    Rehab Potential Good    PT Frequency 1x / week    PT Duration 12 weeks    PT Treatment/Interventions ADLs/Self Care Home Management;Electrical Stimulation;Cryotherapy;Moist Heat;Balance training;Neuromuscular re-education;Therapeutic exercise;Therapeutic activities;Functional mobility training;Gait training;Patient/family education;Manual techniques;Dry needling;Passive range of motion    PT Next Visit Plan continue balance, LE strength, Lt shoulder ROM/strength, try resisted walking, step ups/downs, leg press, multi-directional stepping    PT Home Exercise Plan Access Code: [email or web address]    Consulted and Agree with Plan of Care Patient           Patient will benefit from skilled therapeutic intervention in order to improve the following deficits and impairments:     Visit Diagnosis: Chronic left shoulder pain  Muscle weakness (generalized)  Abnormal posture     Problem List Patient Active Problem List   Diagnosis Date Noted  . Sleep walking 05/23/2019  . Cognitive complaints with normal exam 05/23/2019  . Cerebral arteriovenous malformation (AVM) 05/23/2019  . 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 Nocturnal seizures ([place]) 05/23/2019  . Adult night terrors 01/09/2019  . Degenerative lumbar spinal stenosis 08/15/2018  . Memory difficulty 07/26/2017  . Other parasomnia 07/26/2017  . Anemia 06/02/2017  . Osteopenia 06/02/2017  . Spinal stenosis 06/02/2017  . Iron deficiency anemia secondary to inadequate dietary iron intake 08/05/2016  . Fatigue associated with anemia 08/05/2016  . Primary osteoarthritis of both feet 02/04/2016  . Essential  hypertension 02/04/2016  . PMR (polymyalgia rheumatica) (HCC) 02/03/2016  . Osteoarthritis, hand 02/03/2016  . Age-related osteoporosis without current pathological fracture 02/03/2016  . DDD (degenerative disc disease), lumbar 02/03/2016  . High risk medication use 02/03/2016  . 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 Cerebral cavernous malformation type 1 12/04/2013  . Night terrors, adult 09/26/2013  . HEMORRHOIDS-EXTERNAL 09/19/2009  . GERD 09/19/2009  . CONSTIPATION 09/19/2009  . DYSPHAGIA 09/19/2009  . PERSONAL HISTORY OF COLONIC POLYPS 09/19/2009    Harlow Carrizales E Meigs 04/01/2020, 5:15 PM  [facility] [address] [place], [place], [address] Phone: [phone]   Fax:  [phone]  Name: Katalina Pettibone MRN: [id number] Date of Birth: Nov 24, 1933

## 2020-04-07 ENCOUNTER — Other Ambulatory Visit: Payer: Self-pay | Admitting: Neurology

## 2020-04-07 DIAGNOSIS — F514 Sleep terrors [night terrors]: Secondary | ICD-10-CM

## 2020-04-07 MED ORDER — CLONAZEPAM 0.5 MG PO TABS: 0.5000 mg | ORAL_TABLET | Freq: Every day | ORAL | 1 refills | Status: DC

## 2020-04-08 ENCOUNTER — Encounter: Payer: Self-pay | Admitting: Neurology

## 2020-04-08 ENCOUNTER — Encounter: Payer: Medicare HMO | Admitting: Physical Therapy

## 2020-04-08 DIAGNOSIS — H5051 Esophoria: Secondary | ICD-10-CM | POA: Diagnosis not present

## 2020-04-08 DIAGNOSIS — H5053 Vertical heterophoria: Secondary | ICD-10-CM | POA: Diagnosis not present

## 2020-04-08 DIAGNOSIS — H532 Diplopia: Secondary | ICD-10-CM | POA: Diagnosis not present

## 2020-04-08 DIAGNOSIS — H518 Other specified disorders of binocular movement: Secondary | ICD-10-CM | POA: Diagnosis not present

## 2020-04-15 ENCOUNTER — Ambulatory Visit: Payer: Medicare HMO | Admitting: Physical Therapy

## 2020-04-15 ENCOUNTER — Other Ambulatory Visit: Payer: Self-pay

## 2020-04-17 NOTE — Progress Notes (Deleted)
Office Visit Note  Patient: Gloria Anderson             Date of Birth: [birth year]           MRN: [id number]             PCP: [name], MD Referring: [name], MD Visit Date: 05/01/2020 Occupation: @GUAROCC @  Subjective:  No chief complaint on file.   History of Present Illness: Gloria Anderson is a 87 y.o. female ***   Activities of Daily Living:  Patient reports morning stiffness for *** {minute/hour:19697}.   Patient {ACTIONS;DENIES/REPORTS:21021675::"Denies"} nocturnal pain.  Difficulty dressing/grooming: {ACTIONS;DENIES/REPORTS:21021675::"Denies"} Difficulty climbing stairs: {ACTIONS;DENIES/REPORTS:21021675::"Denies"} Difficulty getting out of chair: {ACTIONS;DENIES/REPORTS:21021675::"Denies"} Difficulty using hands for taps, buttons, cutlery, and/or writing: {ACTIONS;DENIES/REPORTS:21021675::"Denies"}  No Rheumatology ROS completed.   PMFS History:  Patient Active Problem List   Diagnosis Date Noted  . Sleep walking 05/23/2019  . Cognitive complaints with normal exam 05/23/2019  . Cerebral arteriovenous malformation (AVM) 05/23/2019  . 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 Nocturnal seizures ([place]) 05/23/2019  . Adult night terrors 01/09/2019  . Degenerative lumbar spinal stenosis 08/15/2018  . Memory difficulty 07/26/2017  . Other parasomnia 07/26/2017  . Anemia 06/02/2017  . Osteopenia 06/02/2017  . Spinal stenosis 06/02/2017  . Iron deficiency anemia secondary to inadequate dietary iron intake 08/05/2016  . Fatigue associated with anemia 08/05/2016  . Primary osteoarthritis of both feet 02/04/2016  . Essential hypertension 02/04/2016  . PMR (polymyalgia rheumatica) (HCC) 02/03/2016  . Osteoarthritis, hand 02/03/2016  . Age-related osteoporosis without current pathological fracture 02/03/2016  . DDD (degenerative disc disease), lumbar 02/03/2016  . High risk medication use 02/03/2016  . Parasomnia, organic  08/04/2015  . 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 Cerebral cavernous malformation type 1 12/04/2013  . Night terrors, adult 09/26/2013  . HEMORRHOIDS-EXTERNAL 09/19/2009  . GERD 09/19/2009  . CONSTIPATION 09/19/2009  . DYSPHAGIA 09/19/2009  . PERSONAL HISTORY OF COLONIC POLYPS 09/19/2009    Past Medical History:  Diagnosis Date  . Arthritis   . Complication of anesthesia   . Constipation   . Depression   . GERD (gastroesophageal reflux disease)   . Hypertension   . Hypothyroidism   . Osteoporosis   . Pneumonia 2018  . Polymyalgia ([place])   . Polymyalgia rheumatica ([place])   . PONV (postoperative nausea and vomiting)     Family History  Problem Relation Age of Onset  . CVA Father   . Tuberculosis Mother   . Lung cancer Brother    Past Surgical History:  Procedure Laterality Date  . COLONOSCOPY W/ POLYPECTOMY    . EYE SURGERY     both cataracts  . LUMBAR LAMINECTOMY/DECOMPRESSION MICRODISCECTOMY N/A 08/15/2018   Procedure: Lumbar three to Lumbar five Decompressive lumbar laminectomy;  Surgeon: Erline Levine, MD;  Location: [place];  Service: Neurosurgery;  Laterality: N/A;  . NASAL SINUS SURGERY    . SHOULDER SURGERY Right 2007   repair  . TONSILLECTOMY    . TRIGGER FINGER RELEASE  03/19/2011   Procedure:  left middle finger RELEASE TRIGGER FINGER/A-1 PULLEY;  Surgeon: Cammie Sickle., MD;  Location: [place];  Service: Orthopedics;  Laterality: Right;  Procedure:  Release Right Long and Ring Trigger Fingers, Release Left Long Trigger Finger, Injection Left Long Proximal Phalangeal Joint  . TRIGGER FINGER RELEASE Right    Ring finger, middle finger  . UPPER GASTROINTESTINAL ENDOSCOPY     Social History   Social History  Narrative   Patient is widowed.   Patient has two children.   Patient does not drink any caffeine.    Patient has a high school education.   Patient is right-handed.            Immunization History  Administered  Date(s) Administered  . Influenza, High Dose Seasonal PF 12/19/2016  . PFIZER(Purple Top)SARS-COV-2 Vaccination 03/24/2019, 04/13/2019, 11/29/2019  . Zoster Recombinat (Shingrix) 08/01/2017, 10/10/2017     Objective: Vital Signs: There were no vitals taken for this visit.   Physical Exam   Musculoskeletal Exam: ***  CDAI Exam: CDAI Score: -- Patient Global: --; Provider Global: -- Swollen: --; Tender: -- Joint Exam 05/01/2020   No joint exam has been documented for this visit   There is currently no information documented on the homunculus. Go to the Rheumatology activity and complete the homunculus joint exam.  Investigation: No additional findings.  Imaging: No results found.  Recent Labs: Lab Results  Component Value Date   WBC 4.9 03/21/2020   HGB 11.4 (L) 03/21/2020   PLT 186 03/21/2020   NA 139 03/21/2020   K 4.5 03/21/2020   CL 103 03/21/2020   CO2 29 03/21/2020   GLUCOSE 94 03/21/2020   BUN 27 (H) 03/21/2020   CREATININE 1.09 (H) 03/21/2020   BILITOT 1.2 03/21/2020   AST 25 03/21/2020   ALT 18 03/21/2020   PROT 6.8 03/21/2020   CALCIUM 9.7 03/21/2020   GFRAA 54 (L) 03/21/2020    Speciality Comments: PLQ eye exam: 10/16/2019 normal. Dr. Gershon Crane. Follow up in 1 year.  Procedures:  No procedures performed Allergies: Patient has no known allergies.   Assessment / Plan:     Visit Diagnoses: No diagnosis found.  Orders: No orders of the defined types were placed in this encounter.  No orders of the defined types were placed in this encounter.   Face-to-face time spent with patient was *** minutes. Greater than 50% of time was spent in counseling and coordination of care.  Follow-Up Instructions: No follow-ups on file.   Earnestine Mealing, CMA  Note - This record has been created using Editor, commissioning.  Chart creation errors have been sought, but may not always  have been located. Such creation errors do not reflect on  the standard of medical  care.

## 2020-04-22 ENCOUNTER — Ambulatory Visit: Payer: Medicare HMO | Admitting: Physical Therapy

## 2020-04-22 ENCOUNTER — Other Ambulatory Visit: Payer: Self-pay

## 2020-04-22 ENCOUNTER — Encounter: Payer: Self-pay | Admitting: Physical Therapy

## 2020-04-22 DIAGNOSIS — G8929 Other chronic pain: Secondary | ICD-10-CM | POA: Diagnosis not present

## 2020-04-22 DIAGNOSIS — M6281 Muscle weakness (generalized): Secondary | ICD-10-CM

## 2020-04-22 DIAGNOSIS — R293 Abnormal posture: Secondary | ICD-10-CM

## 2020-04-22 DIAGNOSIS — M25512 Pain in left shoulder: Secondary | ICD-10-CM | POA: Diagnosis not present

## 2020-04-22 NOTE — Therapy (Signed)
[facility] [address], [place], [place], [address] Phone: [phone]   Fax:  [phone]  Physical Therapy Treatment  Patient Details  Name: Gloria Anderson MRN: [id number] Date of Birth: 1933-11-15 Referring Provider (PT): Bo Merino, MD   Encounter Date: 04/22/2020   PT End of Session - 04/22/20 1351    Visit Number 4    Date for PT Re-Evaluation 05/13/20    Authorization Type [employer]    Progress Note Due on Visit 10    PT Start Time 1400    PT Stop Time 1442    PT Time Calculation (min) 42 min    Activity Tolerance Patient tolerated treatment well    Behavior During Therapy [facility] for tasks assessed/performed           Past Medical History:  Diagnosis Date  . Arthritis   . Complication of anesthesia   . Constipation   . Depression   . GERD (gastroesophageal reflux disease)   . Hypertension   . Hypothyroidism   . Osteoporosis   . Pneumonia 2018  . Polymyalgia ([place])   . Polymyalgia rheumatica ([place])   . PONV (postoperative nausea and vomiting)     Past Surgical History:  Procedure Laterality Date  . COLONOSCOPY W/ POLYPECTOMY    . EYE SURGERY     both cataracts  . LUMBAR LAMINECTOMY/DECOMPRESSION MICRODISCECTOMY N/A 08/15/2018   Procedure: Lumbar three to Lumbar five Decompressive lumbar laminectomy;  Surgeon: Erline Levine, MD;  Location: [place];  Service: Neurosurgery;  Laterality: N/A;  . NASAL SINUS SURGERY    . SHOULDER SURGERY Right 2007   repair  . TONSILLECTOMY    . TRIGGER FINGER RELEASE  03/19/2011   Procedure:  left middle finger RELEASE TRIGGER FINGER/A-1 PULLEY;  Surgeon: Cammie Sickle., MD;  Location: [place];  Service: Orthopedics;  Laterality: Right;  Procedure:  Release Right Long and Ring Trigger Fingers, Release Left Long Trigger Finger, Injection Left Long Proximal Phalangeal Joint  . TRIGGER FINGER RELEASE Right    Ring finger, middle  finger  . UPPER GASTROINTESTINAL ENDOSCOPY      There were no vitals filed for this visit.   Subjective Assessment - 04/22/20 1405    Subjective I feel better today and am ready to work.  My Lt shoulder hurts if I move the wrong way.    Pertinent History lumbar laminectomy 2021, Rt shoulder surgery    Limitations Lifting;House hold activities;Other (comment)    Patient Stated Goals LE strength, balance, Lt shoulder use for functional tasks    Currently in Pain? No/denies                             [facility] Adult PT Treatment/Exercise - 04/22/20 0001      Exercises   Exercises Shoulder;Lumbar;Knee/Hip      Lumbar Exercises: Aerobic   Nustep L4 x 5' PT present to discuss status and monitor      Lumbar Exercises: Standing   Lifting Limitations modified dead lift to mat table 1x10 each at 5lb and 10lb      Knee/Hip Exercises: Machines for Strengthening   Cybex Leg Press bil LEs 70lb 25 reps, 1x15 Rt/Lt each 40lb      Knee/Hip Exercises: Standing   Forward Lunges Limitations backward stepping lunge at counter single UE support x 10 each  Balance Exercises - 04/22/20 0001      Balance Exercises: Standing   SLS Eyes open;Foam/compliant surface;Upper extremity support 1;Upper extremity support 2;3 reps;10 secs   on foam 3x10 2 ski poles, 3x10 on level surface 1 ski pole 2 hands   SLS with Vectors 2 reps;5 reps;Solid surface;Upper extremity assist 2   1 ski pole, fwd/lat/post toe tap clock drill   Step Ups Forward;6 inch;UE support 2   15 reps, march to 3rd step              PT Short Term Goals - 04/01/20 1714      PT SHORT TERM GOAL #1   Title Pt will be ind with initial HEP    Status Achieved      PT SHORT TERM GOAL #2   Title Pt will report at least 25% less pain with functional use of Lt UE for ADLs and light household tasks.    Status On-going      PT SHORT TERM GOAL #3   Title Pt will demo proper hip and knee alignment with  deep squat to reach floor item.    Status Achieved             PT Long Term Goals - 02/19/20 1335      PT LONG TERM GOAL #1   Title Pt will be ind with advanced HEP and understand how to safely progress    Time 12    Period Weeks    Status New    Target Date 05/13/20      PT LONG TERM GOAL #2   Title Improve FOTO to </= 35% to demo less limitation of Lt shoulder.    Baseline 45%    Time 12    Period Weeks    Status New    Target Date 05/13/20      PT LONG TERM GOAL #3   Title Pt will achieve at least 4+/5 strength in scapular stabilizers and Lt shoulder for improved functional use with less pain.    Baseline -    Time 12    Period Weeks    Status New    Target Date 05/13/20      PT LONG TERM GOAL #4   Title Pt will improve functional strength of bil LEs to allow her to rise from floor sitting.    Baseline -    Time 12    Period Weeks    Status New    Target Date 05/13/20      PT LONG TERM GOAL #5   Title Pt will improve BERG score to at least 52/56 to demo reduced fall risk with ability to perform SLS for at least 10 sec on each leg    Time 12    Period Weeks    Status New    Target Date 05/13/20                 Plan - 04/22/20 1445    Clinical Impression Statement Pt took blood pressure meds today and was feeling much better.  She participated in SLS drills with balance on foam pad and multi-directional stepping with single and double UE support with close supervision/CGA due to occassional balance loss with this - it seemed a good level of challenge and Pt reported "this means more to me than working on anything else." PT updated HEP to include backward lunges to work on floor transfer strength for LTG.  Pt would like to continue  focusing on balance drills to improve safety and confidence.    PT Frequency 1x / week    PT Duration 12 weeks    PT Treatment/Interventions ADLs/Self Care Home Management;Electrical Stimulation;Cryotherapy;Moist Heat;Balance  training;Neuromuscular re-education;Therapeutic exercise;Therapeutic activities;Functional mobility training;Gait training;Patient/family education;Manual techniques;Dry needling;Passive range of motion    PT Next Visit Plan continue balance on foam pad, LE strength, Lt shoulder ROM/strength, try resisted walking, step ups/downs, leg press, multi-directional stepping    PT Home Exercise Plan Access Code: [email or web address]    Consulted and Agree with Plan of Care Patient           Patient will benefit from skilled therapeutic intervention in order to improve the following deficits and impairments:     Visit Diagnosis: Chronic left shoulder pain  Muscle weakness (generalized)  Abnormal posture     Problem List Patient Active Problem List   Diagnosis Date Noted  . Sleep walking 05/23/2019  . Cognitive complaints with normal exam 05/23/2019  . Cerebral arteriovenous malformation (AVM) 05/23/2019  . 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 Nocturnal seizures ([place]) 05/23/2019  . Adult night terrors 01/09/2019  . Degenerative lumbar spinal stenosis 08/15/2018  . Memory difficulty 07/26/2017  . Other parasomnia 07/26/2017  . Anemia 06/02/2017  . Osteopenia 06/02/2017  . Spinal stenosis 06/02/2017  . Iron deficiency anemia secondary to inadequate dietary iron intake 08/05/2016  . Fatigue associated with anemia 08/05/2016  . Primary osteoarthritis of both feet 02/04/2016  . Essential hypertension 02/04/2016  . PMR (polymyalgia rheumatica) (HCC) 02/03/2016  . Osteoarthritis, hand 02/03/2016  . Age-related osteoporosis without current pathological fracture 02/03/2016  . DDD (degenerative disc disease), lumbar 02/03/2016  . High risk medication use 02/03/2016  . 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 Cerebral cavernous malformation type 1 12/04/2013  . Night terrors, adult 09/26/2013  . HEMORRHOIDS-EXTERNAL  09/19/2009  . GERD 09/19/2009  . CONSTIPATION 09/19/2009  . DYSPHAGIA 09/19/2009  . PERSONAL HISTORY OF COLONIC POLYPS 09/19/2009    Alene Mires Ulen 04/22/2020, 2:48 PM  [facility] [address], [place], [place], [address] Phone: [phone]   Fax:  [phone]  Name: Adeana Grilliot MRN: [id number] Date of Birth: 1933-12-10

## 2020-04-25 ENCOUNTER — Other Ambulatory Visit: Payer: Self-pay | Admitting: *Deleted

## 2020-04-25 DIAGNOSIS — Z0100 Encounter for examination of eyes and vision without abnormal findings: Secondary | ICD-10-CM | POA: Diagnosis not present

## 2020-04-25 MED ORDER — HYDROXYCHLOROQUINE SULFATE 200 MG PO TABS
200.0000 mg | ORAL_TABLET | Freq: Every day | ORAL | 0 refills | Status: DC
Start: 2020-04-25 — End: 2020-07-04

## 2020-04-25 NOTE — Telephone Encounter (Signed)
LMOM for patient to call to reschedule her 05/01/20 appointment to 5 month follow-up in June.

## 2020-04-25 NOTE — Telephone Encounter (Signed)
Please cancel 05/01/2020 and reschedule to  Return in about 5 months (around 08/19/2020) for Polymyalgia rheumatica. Patient seen 03/21/2020  Thank you.

## 2020-04-25 NOTE — Telephone Encounter (Signed)
RX faxed from [facility] Visit: 03/21/2020 Next Visit: 05/01/2020, message sent to front desk to Please cancel 05/01/2020 and reschedule to  Return in about 5 months (around 08/19/2020) for Polymyalgia rheumatica. Patient seen 03/21/2020  Labs: 03/21/2020, Hemoglobin is low and stable. Creatinine and GFR have improved. Patient should avoid all NSAIDs. Please forward results to her PCP and nephrologist. Eye exam: 10/16/2019  Current Dose per office note 03/21/2020, Plaquenil 200 mg p.o. daily DX: PMR (polymyalgia rheumatica)  Last Fill: 02/04/2020  Okay to refill Plaquenil?

## 2020-05-01 ENCOUNTER — Ambulatory Visit: Payer: Medicare HMO | Admitting: Rheumatology

## 2020-05-01 DIAGNOSIS — N189 Chronic kidney disease, unspecified: Secondary | ICD-10-CM

## 2020-05-01 DIAGNOSIS — F514 Sleep terrors [night terrors]: Secondary | ICD-10-CM

## 2020-05-01 DIAGNOSIS — Z79899 Other long term (current) drug therapy: Secondary | ICD-10-CM

## 2020-05-01 DIAGNOSIS — G8929 Other chronic pain: Secondary | ICD-10-CM

## 2020-05-01 DIAGNOSIS — M5136 Other intervertebral disc degeneration, lumbar region: Secondary | ICD-10-CM

## 2020-05-01 DIAGNOSIS — M5030 Other cervical disc degeneration, unspecified cervical region: Secondary | ICD-10-CM

## 2020-05-01 DIAGNOSIS — Z8719 Personal history of other diseases of the digestive system: Secondary | ICD-10-CM

## 2020-05-01 DIAGNOSIS — F32A Depression, unspecified: Secondary | ICD-10-CM

## 2020-05-01 DIAGNOSIS — M353 Polymyalgia rheumatica: Secondary | ICD-10-CM

## 2020-05-01 DIAGNOSIS — M810 Age-related osteoporosis without current pathological fracture: Secondary | ICD-10-CM

## 2020-05-01 DIAGNOSIS — M19041 Primary osteoarthritis, right hand: Secondary | ICD-10-CM

## 2020-05-01 DIAGNOSIS — G4489 Other headache syndrome: Secondary | ICD-10-CM

## 2020-05-01 DIAGNOSIS — Z9889 Other specified postprocedural states: Secondary | ICD-10-CM

## 2020-05-01 DIAGNOSIS — M19071 Primary osteoarthritis, right ankle and foot: Secondary | ICD-10-CM

## 2020-05-01 DIAGNOSIS — Z8679 Personal history of other diseases of the circulatory system: Secondary | ICD-10-CM

## 2020-05-06 ENCOUNTER — Ambulatory Visit: Payer: Medicare HMO | Admitting: Physical Therapy

## 2020-05-08 ENCOUNTER — Telehealth: Payer: Self-pay

## 2020-05-08 NOTE — Telephone Encounter (Signed)
Spoke with [employer] rep who states that tier exception was denied d/t not receiving response from provider in time.  She advised I call [phone] to reopen tier exception case.  Patient's copay is $11.30 per 90 day supply.  Knox Saliva, PharmD, MPH Clinical Pharmacist (Rheumatology and Pulmonology)

## 2020-05-08 NOTE — Telephone Encounter (Signed)
Apolonio Schneiders from the [facility] Medication Prior Authorization department left a voicemail regarding PA request.  Patient is requesting a tier exception for Hydroxychloroquine 200 mg tablets.  It is too expensive to try and get it covered at a lower tier.  We need patient's diagnosis and which medication they have tried in the past.  Please call [phone]

## 2020-05-08 NOTE — Telephone Encounter (Signed)
Please advise 

## 2020-05-13 ENCOUNTER — Other Ambulatory Visit: Payer: Self-pay

## 2020-05-13 ENCOUNTER — Ambulatory Visit: Payer: Medicare HMO | Attending: Rheumatology | Admitting: Physical Therapy

## 2020-05-13 ENCOUNTER — Encounter: Payer: Self-pay | Admitting: Physical Therapy

## 2020-05-13 DIAGNOSIS — R293 Abnormal posture: Secondary | ICD-10-CM | POA: Insufficient documentation

## 2020-05-13 DIAGNOSIS — M6281 Muscle weakness (generalized): Secondary | ICD-10-CM | POA: Diagnosis not present

## 2020-05-13 DIAGNOSIS — G8929 Other chronic pain: Secondary | ICD-10-CM | POA: Diagnosis not present

## 2020-05-13 DIAGNOSIS — M25512 Pain in left shoulder: Secondary | ICD-10-CM | POA: Diagnosis not present

## 2020-05-13 NOTE — Therapy (Signed)
[facility] [address], [place], [place], [address] Phone: [phone]   Fax:  [phone]  Physical Therapy Treatment  Patient Details  Name: Gloria Anderson MRN: [id number] Date of Birth: 10-Apr-1933 Referring Provider (PT): Bo Merino, MD   Encounter Date: 05/13/2020   PT End of Session - 05/13/20 1153    Visit Number 5    Date for PT Re-Evaluation 05/13/20    Authorization Type [employer]    Progress Note Due on Visit 10    PT Start Time 1145    PT Stop Time 1230    PT Time Calculation (min) 45 min    Activity Tolerance Patient tolerated treatment well    Behavior During Therapy [facility] for tasks assessed/performed           Past Medical History:  Diagnosis Date   Arthritis    Complication of anesthesia    Constipation    Depression    GERD (gastroesophageal reflux disease)    Hypertension    Hypothyroidism    Osteoporosis    Pneumonia 2018   Polymyalgia ([place])    Polymyalgia rheumatica ([place])    PONV (postoperative nausea and vomiting)     Past Surgical History:  Procedure Laterality Date   COLONOSCOPY W/ POLYPECTOMY     EYE SURGERY     both cataracts   LUMBAR LAMINECTOMY/DECOMPRESSION MICRODISCECTOMY N/A 08/15/2018   Procedure: Lumbar three to Lumbar five Decompressive lumbar laminectomy;  Surgeon: Erline Levine, MD;  Location: [place];  Service: Neurosurgery;  Laterality: N/A;   NASAL SINUS SURGERY     SHOULDER SURGERY Right 2007   repair   TONSILLECTOMY     TRIGGER FINGER RELEASE  03/19/2011   Procedure:  left middle finger RELEASE TRIGGER FINGER/A-1 PULLEY;  Surgeon: Cammie Sickle., MD;  Location: [place];  Service: Orthopedics;  Laterality: Right;  Procedure:  Release Right Long and Ring Trigger Fingers, Release Left Long Trigger Finger, Injection Left Long Proximal Phalangeal Joint   TRIGGER FINGER RELEASE Right    Ring finger, middle  finger   UPPER GASTROINTESTINAL ENDOSCOPY      There were no vitals filed for this visit.   Subjective Assessment - 05/13/20 1147    Subjective I am getting ready to move into a retirement home and have been feeling so overwhelmed. I haven't been able to do much of my HEP due to being overwhelmed and busy.  I haven't had any Lt shoulder pain.  My balance is still very bad and worries me about falling.  I am too overwhelmed to continue PT right now so ready to d/c.    Pertinent History lumbar laminectomy 2021, Rt shoulder surgery    Limitations Lifting;House hold activities;Other (comment)    Patient Stated Goals LE strength, balance    Currently in Pain? No/denies              [facility] PT Assessment - 05/13/20 0001      Assessment   Medical Diagnosis M25.512 (ICD-10-CM) - Left shoulder pain M35.3 (ICD-10-CM) - PMR (polymyalgia rheumatica) (HCC)    Referring Provider (PT) Bo Merino, MD    Next MD Visit 4 mos    Prior Therapy yes for lumbar at this facility      Precautions   Precautions Fall    Precaution Comments osteoporosis, no history of recent falls      Observation/Other Assessments   Focus on Therapeutic Outcomes (FOTO)  49%  Functional Tests   Functional tests Squat      Squat   Comments improved form and valgus knee control      Posture/Postural Control   Posture Comments scapular winging bil      AROM   AROM Assessment Site Shoulder    Right/Left Shoulder Left    Left Shoulder Flexion 125 Degrees      Strength   Overall Strength Comments bil shoulders 4/5, scapular strength 4/5 with exception of serratus anterior 4-/5 bil      Berg Balance Test   Sit to Stand Able to stand without using hands and stabilize independently    Standing Unsupported Able to stand safely 2 minutes    Sitting with Back Unsupported but Feet Supported on Floor or Stool Able to sit safely and securely 2 minutes    Stand to Sit Sits safely with minimal use of hands     Transfers Able to transfer safely, minor use of hands    Standing Unsupported with Eyes Closed Able to stand 10 seconds safely    Standing Unsupported with Feet Together Able to place feet together independently and stand for 1 minute with supervision    From Standing, Reach Forward with Outstretched Arm Can reach forward >12 cm safely (5")    From Standing Position, Pick up Object from Floor Able to pick up shoe safely and easily    From Standing Position, Turn to Look Behind Over each Shoulder Looks behind from both sides and weight shifts well    Turn 360 Degrees Able to turn 360 degrees safely one side only in 4 seconds or less    Standing Unsupported, Alternately Place Feet on Step/Stool Able to stand independently and safely and complete 8 steps in 20 seconds    Standing Unsupported, One Foot in Front Able to plae foot ahead of the other independently and hold 30 seconds    Standing on One Leg Able to lift leg independently and hold equal to or more than 3 seconds    Total Score 50    Berg comment: 48/56                         [facility] Adult PT Treatment/Exercise - 05/13/20 0001      Knee/Hip Exercises: Standing   Hip Abduction Stengthening;Both;5 reps;Knee straight    Abduction Limitations VC slow for better glut engagement    Hip Extension Stengthening;Both;5 reps;Knee straight    Extension Limitations VC slow for better glut engagement    Functional Squat 10 reps    Functional Squat Limitations PT demod for form then Pt performed with improved form    Other Standing Knee Exercises high knee march x 20 with single UE support at counter, VC for slow march and glut engagement      Shoulder Exercises: Supine   Horizontal ABduction Strengthening;Both;10 reps;Theraband    Theraband Level (Shoulder Horizontal ABduction) Level 2 (Red)    External Rotation Strengthening;Both;15 reps;Theraband    Theraband Level (Shoulder External Rotation) Level 2 (Red)                Balance Exercises - 05/13/20 0001      Balance Exercises: Standing   SLS Eyes open;2 reps;10 secs;Upper extremity support 2    Tandem Gait Forward;Retro;3 reps;Upper extremity support               PT Short Term Goals - 05/13/20 1235      PT SHORT  TERM GOAL #1   Title Pt will be ind with initial HEP    Status Achieved      PT SHORT TERM GOAL #2   Title Pt will report at least 25% less pain with functional use of Lt UE for ADLs and light household tasks.    Status Achieved      PT SHORT TERM GOAL #3   Title Pt will demo proper hip and knee alignment with deep squat to reach floor item.    Status Achieved             PT Long Term Goals - 05/13/20 1235      PT LONG TERM GOAL #1   Title Pt will be ind with advanced HEP and understand how to safely progress    Status Achieved      PT LONG TERM GOAL #2   Title Improve FOTO to </= 35% to demo less limitation of Lt shoulder.    Baseline 49%      PT LONG TERM GOAL #3   Title Pt will achieve at least 4+/5 strength in scapular stabilizers and Lt shoulder for improved functional use with less pain.    Baseline 4-/5 to 4/5 Lt shoulder    Status Partially Met      PT LONG TERM GOAL #4   Title Pt will improve functional strength of bil LEs to allow her to rise from floor sitting.    Baseline can do but a challenge to get back up    Status Partially Met      PT LONG TERM GOAL #5   Title Pt will improve BERG score to at least 52/56 to demo reduced fall risk with ability to perform SLS for at least 10 sec on each leg    Baseline 50/56    Status Partially Met                 Plan - 05/13/20 1237    Clinical Impression Statement Pt reports she is too overwhelmed with her upcoming move into a retirement home to continue PT at this time.  She has met all STGs and is making progress toward LTGs.  She has had limited attendance due to other health issues with her BP.  She hasn't had experience of Lt shoulder  pain lately although she did report pain on ROM assessment today.  She is most concerned about her balance and worries about falling although she hasn't had any recent history of falls.  BERG balance improved from 48/56 to 50/56, partially meeting this goal.  She continues to have limited Lt shoulder A/ROM and P/ROM with pain but she states she does everything she needs to do regardless of the pain.  Pt's FOTO score regressed for Rt shoulder by 6 points today.  PT transitioned some UE tband exercises to supine which were better tolerated without pain.  PT also reviewed LE strength and balance work that is safe for her to do unsupervised at counter with UE support which she needed initial cueing on to slow down for form or better engagement of muscle groups.  She hopes to return for more PT once she is settled in her new place.  D/C for now per Pt request.    PT Frequency 1x / week    PT Duration 12 weeks    PT Treatment/Interventions ADLs/Self Care Home Management;Electrical Stimulation;Cryotherapy;Moist Heat;Balance training;Neuromuscular re-education;Therapeutic exercise;Therapeutic activities;Functional mobility training;Gait training;Patient/family education;Manual techniques;Dry needling;Passive range of motion    PT  Next Visit Plan d/c to HEP per Pt request - too overwhelmed with upcoming move    PT Home Exercise Plan Access Code: [email or web address]    Consulted and Agree with Plan of Care Patient           Patient will benefit from skilled therapeutic intervention in order to improve the following deficits and impairments:     Visit Diagnosis: Chronic left shoulder pain  Muscle weakness (generalized)  Abnormal posture     Problem List Patient Active Problem List   Diagnosis Date Noted   Sleep walking 05/23/2019   Cognitive complaints with normal exam 05/23/2019   Cerebral arteriovenous malformation (AVM) 05/23/2019   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[place]) 05/23/2019    Nocturnal seizures ([place]) 05/23/2019   Adult night terrors 01/09/2019   Degenerative lumbar spinal stenosis 08/15/2018   Memory difficulty 07/26/2017   Other parasomnia 07/26/2017   Anemia 06/02/2017   Osteopenia 06/02/2017   Spinal stenosis 06/02/2017   Iron deficiency anemia secondary to inadequate dietary iron intake 08/05/2016   Fatigue associated with anemia 08/05/2016   Primary osteoarthritis of both feet 02/04/2016   Essential hypertension 02/04/2016   PMR (polymyalgia rheumatica) ([place]) 02/03/2016   Osteoarthritis, hand 02/03/2016   Age-related osteoporosis without current pathological fracture 02/03/2016   DDD (degenerative disc disease), lumbar 02/03/2016   High risk medication use 02/03/2016   Parasomnia, organic 08/04/2015   Parasomnia due to medical condition 02/03/2015   Cerebral cavernous malformation type 1 12/04/2013   Night terrors, adult 09/26/2013   HEMORRHOIDS-EXTERNAL 09/19/2009   GERD 09/19/2009   CONSTIPATION 09/19/2009   DYSPHAGIA 09/19/2009   PERSONAL HISTORY OF COLONIC POLYPS 09/19/2009    PHYSICAL THERAPY DISCHARGE SUMMARY  Visits from Start of Care: 5  Current functional level related to goals / functional outcomes: See above.  Pt requested d/c at this time due to upcoming move/feeling overwhelmed.  She hopes to return for more PT after she moves in April/May time frame.  She has ongoing shoulder pain and limited motion with reaching tasks and ongoing balance deficits/fear of falls.  She will benefit from PT when she is ready to return.   Remaining deficits: See above   Education / Equipment: HEP Plan: Patient agrees to discharge.  Patient goals were partially met. Patient is being discharged due to the patient's request.  ?????        Baruch Merl, PT 05/13/20 12:44 PM   [facility] [address], [place], [place], [address] Phone:  [phone]   Fax:  [phone]  Name: Gloria Anderson MRN: [id number] Date of Birth: 09-29-1933

## 2020-05-14 DIAGNOSIS — D485 Neoplasm of uncertain behavior of skin: Secondary | ICD-10-CM | POA: Diagnosis not present

## 2020-05-14 NOTE — Telephone Encounter (Signed)
Patient advised that Tier Exception request was denied.  Patient is comfortable with paying approximately $12 per 90 days for hydroxychloroquine. Nothing further needed.  Knox Saliva, PharmD, MPH Clinical Pharmacist (Rheumatology and Pulmonology)

## 2020-05-20 ENCOUNTER — Ambulatory Visit: Payer: Medicare HMO | Admitting: Physical Therapy

## 2020-05-22 DIAGNOSIS — D485 Neoplasm of uncertain behavior of skin: Secondary | ICD-10-CM | POA: Diagnosis not present

## 2020-05-22 DIAGNOSIS — L989 Disorder of the skin and subcutaneous tissue, unspecified: Secondary | ICD-10-CM | POA: Diagnosis not present

## 2020-05-26 DIAGNOSIS — N1832 Chronic kidney disease, stage 3b: Secondary | ICD-10-CM | POA: Diagnosis not present

## 2020-05-26 DIAGNOSIS — R7302 Impaired glucose tolerance (oral): Secondary | ICD-10-CM | POA: Diagnosis not present

## 2020-05-26 DIAGNOSIS — I340 Nonrheumatic mitral (valve) insufficiency: Secondary | ICD-10-CM | POA: Diagnosis not present

## 2020-05-26 DIAGNOSIS — M810 Age-related osteoporosis without current pathological fracture: Secondary | ICD-10-CM | POA: Diagnosis not present

## 2020-05-26 DIAGNOSIS — R69 Illness, unspecified: Secondary | ICD-10-CM | POA: Diagnosis not present

## 2020-05-26 DIAGNOSIS — M5416 Radiculopathy, lumbar region: Secondary | ICD-10-CM | POA: Diagnosis not present

## 2020-05-26 DIAGNOSIS — I071 Rheumatic tricuspid insufficiency: Secondary | ICD-10-CM | POA: Diagnosis not present

## 2020-05-26 DIAGNOSIS — M353 Polymyalgia rheumatica: Secondary | ICD-10-CM | POA: Diagnosis not present

## 2020-05-26 DIAGNOSIS — I951 Orthostatic hypotension: Secondary | ICD-10-CM | POA: Diagnosis not present

## 2020-05-26 DIAGNOSIS — D649 Anemia, unspecified: Secondary | ICD-10-CM | POA: Diagnosis not present

## 2020-05-26 DIAGNOSIS — E039 Hypothyroidism, unspecified: Secondary | ICD-10-CM | POA: Diagnosis not present

## 2020-05-26 DIAGNOSIS — I129 Hypertensive chronic kidney disease with stage 1 through stage 4 chronic kidney disease, or unspecified chronic kidney disease: Secondary | ICD-10-CM | POA: Diagnosis not present

## 2020-05-26 DIAGNOSIS — R251 Tremor, unspecified: Secondary | ICD-10-CM | POA: Diagnosis not present

## 2020-05-26 DIAGNOSIS — K5900 Constipation, unspecified: Secondary | ICD-10-CM | POA: Diagnosis not present

## 2020-05-26 DIAGNOSIS — E785 Hyperlipidemia, unspecified: Secondary | ICD-10-CM | POA: Diagnosis not present

## 2020-05-26 DIAGNOSIS — D126 Benign neoplasm of colon, unspecified: Secondary | ICD-10-CM | POA: Diagnosis not present

## 2020-05-26 DIAGNOSIS — R002 Palpitations: Secondary | ICD-10-CM | POA: Diagnosis not present

## 2020-05-26 DIAGNOSIS — I679 Cerebrovascular disease, unspecified: Secondary | ICD-10-CM | POA: Diagnosis not present

## 2020-05-26 DIAGNOSIS — K219 Gastro-esophageal reflux disease without esophagitis: Secondary | ICD-10-CM | POA: Diagnosis not present

## 2020-05-27 ENCOUNTER — Ambulatory Visit: Payer: Medicare HMO | Admitting: Physical Therapy

## 2020-05-30 NOTE — Progress Notes (Signed)
Labs 05/26/2020:  Serum glucose 112 mg, BUN 26, creatinine 1.2, EGFR 42 mL, potassium 4.5, CMP otherwise normal.

## 2020-05-30 NOTE — Progress Notes (Signed)
Creatinine has been stable overall from previous.

## 2020-07-01 ENCOUNTER — Other Ambulatory Visit: Payer: Self-pay | Admitting: Pharmacist

## 2020-07-01 ENCOUNTER — Telehealth: Payer: Self-pay | Admitting: Pharmacist

## 2020-07-01 DIAGNOSIS — C44311 Basal cell carcinoma of skin of nose: Secondary | ICD-10-CM | POA: Diagnosis not present

## 2020-07-01 DIAGNOSIS — Z79899 Other long term (current) drug therapy: Secondary | ICD-10-CM

## 2020-07-01 NOTE — Telephone Encounter (Signed)
Patient due for Prolia on 07/09/20. Last Prolia was 01/11/20.  Future orders for CBC, CMP, and Vitamin D placed.  Called patient and she states she is having some work up for potential [facility] and then dental surgery thereafter. She would like to hold on Prolia until this workup is completed.  She requested I reach back out in about a month to check on status.  Patient has f/u with Dr. Estanislado Pandy scheduled on 08/20/20  Knox Saliva, PharmD, MPH Clinical Pharmacist (Rheumatology and Pulmonology)

## 2020-07-04 ENCOUNTER — Other Ambulatory Visit: Payer: Self-pay

## 2020-07-04 NOTE — Telephone Encounter (Signed)
Patient called requesting prescription refill of Hydroxychloroquine to be sent to [facility].

## 2020-07-04 NOTE — Telephone Encounter (Signed)
Last Visit: 1/212022 Next Visit: 08/20/2020 Labs: BMP 05/26/2020, Glucose 112, BUN 26, 03/21/2020, Hemoglobin is low and stable. Creatinine and GFR have improved. Patient should avoid all NSAIDs. Please forward results to her PCP and nephrologist. Eye exam: 817/2021  Current Dose per office note 03/21/2020, Plaquenil 200 mg p.o. daily  DX :PMR (polymyalgia rheumatica  Last Fill: 04/25/2020  Okay to refill Plaquenil? [facility]

## 2020-07-07 MED ORDER — HYDROXYCHLOROQUINE SULFATE 200 MG PO TABS
200.0000 mg | ORAL_TABLET | Freq: Every day | ORAL | 0 refills | Status: DC
Start: 2020-07-07 — End: 2020-10-10

## 2020-07-08 ENCOUNTER — Ambulatory Visit: Payer: Medicare HMO | Admitting: Neurology

## 2020-07-15 ENCOUNTER — Ambulatory Visit: Payer: Medicare HMO | Admitting: Neurology

## 2020-07-17 ENCOUNTER — Other Ambulatory Visit: Payer: Self-pay | Admitting: Neurology

## 2020-07-17 DIAGNOSIS — F514 Sleep terrors [night terrors]: Secondary | ICD-10-CM

## 2020-08-05 ENCOUNTER — Encounter: Payer: Self-pay | Admitting: Neurology

## 2020-08-05 ENCOUNTER — Ambulatory Visit: Payer: Medicare HMO | Admitting: Neurology

## 2020-08-05 DIAGNOSIS — R69 Illness, unspecified: Secondary | ICD-10-CM | POA: Diagnosis not present

## 2020-08-05 DIAGNOSIS — F514 Sleep terrors [night terrors]: Secondary | ICD-10-CM | POA: Diagnosis not present

## 2020-08-05 MED ORDER — PREVAGEN 10 MG PO CAPS: 10.0000 mg | ORAL_CAPSULE | Freq: Every day | ORAL | 0 refills | Status: DC

## 2020-08-05 MED ORDER — CLONAZEPAM 0.5 MG PO TABS: 0.5000 mg | ORAL_TABLET | Freq: Every day | ORAL | 0 refills | Status: DC

## 2020-08-05 NOTE — Progress Notes (Signed)
[facility]  Provider:  Larey Seat, MD    Referring Provider: [name], MD Primary Care Physician:  [name], MD  Chief Complaint  Patient presents with  . Follow-up    Room 11. Alone. MOCA today 27/30. Last MOCA 26/30 (completed on 07/09/19). Reports slight decline in memory. Good and bad days. She is sleeping well. Recently moved to an independent living community.    HPI:   This patient of Dr. 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 She tolerates Klonopin and is happy with the REM sleep control. She has developed higher blood pressures spurs of higher blood pressures that Dr. Forde Dandy and Dr. Einar Gip have followed, and initiated treatment- she developed orthostatic BP!Marland Kitchen She was found to be anemic, sleepy and fatigued. Has normal B 12 and started oral iron. She has a history of PMR. Polymyalgia rheumatica. She had a normal colonoscopy in 2016. Had lost weight , but recovered . Mrs. Rowe brought her last laboratory results with her, white blood cell count was 3.74K, red blood cell count was 2.9, hemoglobin 9.8 g, hematocrit 28.4%, corpuscular volume 97.1 MCH 33.5. She was over the phone prescribed Nu-iron to take as a supplement. Needs TIBC, Total iron deficiency,   09-26-13- The patient's sleep study from 08-05-13 revealed no apnea, no oxygen desaturation, no irregular heart beats, and only took clusters of limb movements that seem to be related to discomfort with sleeping on the left side. She had no periodic limb movements during REM sleep. There was a prolonged period of slow-wave sleep at around 2 AM during which nor night terrors sleep walking or sleep talking was ordered. The patient reported  that she had another spell- a feeling as if something brushes up on her scalp, or behind her head.  Since the patient has a  long-standing bruit that she can hear at night and that bothers her( tinnitus) I have also suggested to do an MRA of the brain , as I am concerned that they're tortuous vessels , which  may be affecting the left frontal lobe perfusion. This could be a trigger for a nocturnal event as described by the patient clinically. Her MRi documented to my surprise exactly that ! An intracerebral  Venous- cavernous malformation. We are now meeting on 09-26-13 to discuss the results and documented the findings. She had more Night terrors the last weeks. The gabapentin gave her diplopia. I suggest now Topiramate, but she reminded me of involuntary weight loss and loss of apetite. I will try a very low dose of KLONOPIN.  The patient should watch her BP and avoid straining , pressure inducing activities.   RV 02-27-14  Discussed EEG results, no epileptiform activity , but frontal sharps noted. These are of unclear significance.  See report. No change in meds.needed  Patient is free to travel to [place] in April.  The patient,  a caucasian right handed female , originally from [place], reports vivid dreams, with fearful frightful content.  Her dreams have let her to act out some of the dream content, she wakes with her arms extended and with palpitations, tachycardia form fright. She kicks and fell out of bed, yelling, calling out . Following  one dream last year,  she injured her face in a fall . She begun covering all sharp edges.  She has polymyalgia rheumatica and was treated with prednisone , gaining  weight through this time She rises at 8.30, the alarm wakes her at 8.  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 No childhood sleep disorder history. Usually a 7 -8 hour sleeper , refreshed.    Soc history : The patient is widowed for 12 years, has adult children and grandchildren.The bruit in her left temple keeps her form sleeping on the side. She has 3 carotid dopplers over 25 years, diagnosed with  tortious vessel.  MRI brain was  normal 4 year ago ( Dr. Carloyn Manner)  No MRA was done at the time , I like to rule out a vascular abnormality at the frontal lobe.  08-04-2015 Her night terrors have responded to the medication, and her spells are rare now. She returned from [place] and had one event which "scared her sister to death "  I have spoken to Dr. Delice Lesch about the possible component of cavernous vascular malformations producing a seizure event. I ordered a 48 hour study but the patient underwent a 72 hour study. This was normal- no seizure activity,  She is doing well on Klonopin. She gained weight on klonopin, (a fact she likes!) She reports vertigo, dizzines with rapid head movementns and bending down, also controlled with Klonopin.  Her eye feel as if pulsating - can this be the AVM? HTN was addressed in the last 2 visits with endocrinology / PCP.  She joked today that her weight has been stabile for so long, she had no excuse for a new wardrobe !  She had no spells for the last 4 month. She needs no refills today.  Interval history from 02/03/2016. [name] reports today that after being almost free of vivid dreams for several month she has returned having some not to the same intensity and not the same frequency as prior to treatment. Some of them are scary they have the character of the night may or or night terror, and she responds was higher heart rate feeling under stress. Klonopin has been working very well at a very low dose for her by now for over 2 years. I would like to continue using Klonopin with the option that if the medication wears off she could increase the dose. She also does not have insomnia when taking Klonopin. She is not fatigued and she is not excessively daytime sleepy, she also does not report that she is groggy in the mornings. She feels stable well-balanced and not at a risk of falling. I will refill her medication today. I also congratulated her to her 87th birthday, which she celebrates in style  this coming Saturday.  Travel history from 15 February 2017, [name] has traveled to summer to [place] with some difficulties.  She had no medical problems however.  She brought me her Klonopin prescription but she had not needed to fill and which is now a year old.  I will refill her Klonopin through [facility] today. She has no complain of grogginess or mental cloudiness in the morning. She has night terrors. She has noted that she is not easily scared , not panicked.   07-26-2017, Mikeya Tomasetti is a 87 y.o. female patient for follow up on concerns of cognitive function, memory.  She seems well, is alert and happy. She has a full social life.  She is relieved that the [facility] was normal 26-30 points. Balance is reportedly poor, she is careful.  She continues to report hip pain, and has some night spells- but much better than before Klonopin was  initiated.  Was seen by Dr. Einar Gip cardiology for BP variability- he asked  Her to start wearing compression stockings and elevate her feet at night. She has not always hydrated well, and she is struggling with HTN and orthostatic lightheadedness.   Today 10/05/17:  Ms. Buller is an 87 year old female with a history of memory disturbance and night terrors.  She returns today to discuss her balance.  She states that she went to see her primary care after she had an episode while ambulating.  She reports that she was walking with a neighbor.  15 minutes in to her walk she lost sensation in the lower part of her back and in her ankles.  She reports that she felt like she had no control over her balance.  She states that she had to sit down.  She reports after that event she had some mild episodes but since then this has resolved and she has not had any trouble walking.  She states that she went for a walk last night and did not have any symptoms.  She states that she has found if she uses a cane this offers her more stability.  In the past CT of the  lumbar spine as well as MRI of the lumbar spine shows stenosis.  She denies any changes with her bowels or bladder.  Denies any numbness or tingling in the lower extremities.  She returns today for an evaluation.  HISTORY Andrey Hoobler is a 87 y.o. female patient for follow up on concerns of cognitive function, memory.  She seems well, is alert and happy. She has a full social life.  She is relieved that the [facility] was normal 26-30 points. Balance is reportedly poor, she is careful.  She continues to report hip pain, and has some night spells- but much better than before Klonopin was initiated.  Was seen by Dr. Einar Gip cardiology for BP variability- he asked  Her to start wearing compression stockings and elevate her feet at night. She has not always hydrated well, and she is struggling with HTN and orthostatic lightheadedness.   10-20-2017, Mrs. Coverdale, who is  87 years old reports a sleep walking episode that shook her- she dreamt about a scary situation, left her bed , walked about and finally woke up - realized she had dreamt and is not concerned about repeating this episodes with a risk of injury.  She started on Hydralazine as a new BP medication- could this have contributed/ she has been well controlled on Klonopin, and we will add trazodone tonight.   Interval history from 30 January 2018, I have the pleasure of meeting today with Mr. Jenny Reichmann [employer], this is not an emergency visit but a visit we had on our books for a while.  The patient performed today and a Montreal cognitive assessment and she scored 27 out of 30 points which is an entirely normal result.  Especially her short-term recall is excellent.  She had no trouble with naming objects and doing mathematics or arithmetic.  She had normal attention she has some trouble with visual-spatial crest such as drawing or during the Trail Making Test.  This is unchanged to previous results her Epworth sleepiness score was endorsed at 10 and  her fatigue severity score was not endorsed.  Her medication list is available, she is using Klonopin 0.5 mg half tablet by mouth at bedtime. Sleep is fragmented but she feels its related to bathroom breaks. She reports nocturia 5-6 times, and she drinks  water all day. No burning and no foul smelling urine.    RV 01-09-2019, Mrs Lisbon is a meanwhile 87 year old lang time patient whom I refereed toDr Vertell Limber for pain- Shemicka Cohrs  is a 87 y.o. female  with essential hypertension and orthostatic hypotension, mild hyperlipidemia, moderate MR and TR, moderate pulmonary hypertension presents here for 6 month follow-up of hypertension and valvular heart disease.  She had back surgery in June 2020 and has not had back pain since then. 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 .  She constantly checks blood pressure and gets worried about elevated blood pressure, I had multiple phone calls in the past. Presently doing well and except for dizziness when she stands up or is on her feet for long period of time, remains asymptomatic, back pain has resolved. Since surgery she had some lightheadedness. Her HTN today is very well controlled, she is wearing compression stockings.   Rv 05-23-2019-  Mrs. Harshbarger describes a frightening sleep spell. She must have left her bed at 4 Am on a Sunday, and remembers dreaming that she reached out for something that moved away , could not be reached, but she remembers reaching out over and over-. She woke up having fallen in front of the door, and a corner of a carpet had turned over- she assumed she stumbled. She has no recollection of any of these complex movements for a while. Had none of the previous spells of feeling someone breathing on her.  She does not have ge headaches, only hematoma on the left arm and elbow. No nausea.some neck stiffness, and she has had cervical evaluation with dr. Vertell Limber. No numbness  in hands or loss of grip strength. Her bottom hurts, like sitting on her hip bones.    Rv 07-09-2019; I have the pleasure of seeing Aracelys Mitropoulos today and meanwhile 87 year old Caucasian lady of [place] origin whom I follow for parasomnias and arteriovenous malformations.  She has recently had a carotid Doppler study performed with Dr. Einar Gip and I could see her today the results with her she was very relieved to hear that she had no carotid artery stenosis plaque or flow abnormality.  In addition test of October last year she had an echocardiogram which was also in normal limits.  Today's Montreal cognitive assessment shows 26 out of 30 points which is normal, and the patient was actually able to draw a three-dimensional cube something she has not done in the past.  She recalled 5 out of 5 recall words.  So I am pleasantly supplies and she says that she feels more serene and confident since she has been starting to take her regimen upon recommendation by Dr. Forde Dandy Dr.Perini. It is a very expenesive supplement.   01-09-2020, RV  I am meeting today with Mrs. Vertie Dibbern and meanwhile 87 year old yes patient of Dr. [name].  She had a repeat MRI angio hot without contrast and an MRI with and without contrast of the brain she have the previously noted cavernous malformation in the left cerebellum and right basal ganglia which has not changed, there is no aneurysm seen, all blood brain vessels seem to be without stenosis.  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  There is mild progressive generalized cortical atrophy which is age related.  So overall there is no acute change or development of  a chronic neurodegenerative disease. Her metabolic panels were repeated 11-28-2019, and showed stable GFR 40, Creatinine.   RV : 08-05-2020 patient is here alone. MOCA today 27/30. Last MOCA 26/30 (completed on 07/09/19).  Reports slight  decline in memory, delayed recall- she takes Prevagen and attributes her success to this OTC supplement. Kermit Balo and bad days. She is sleeping well. Recently moved to an independent living community: [facility]. She has had moderate to good control of her parasomnia, and she had improvement in low back pain. Since February she had onset of diplopia and was seen at [facility]. She is in need of her 4 th covid shot. Had 3 times Pfizer.    Review of Systems: Out of a complete 14 system review, the patient complains of only the following symptoms, and all other reviewed systems are negative. Acting out dreams, yelling but not leaving the bed. Sleeps 7- 8 hours, nocturia.   Sleep walking-  05-19-2019 first repeat since 01-30-2018 .  Increased anxiety -Klonopin dependent , but this medication was needed to control her parasomnia. Clonazepam-   Concerned about memory- MOCA was 26/ 30 points 2019, she has not mentioned any more problems. MOCA was today 07-09-2019 also 26/ 30 points. MOCA - repeat in 2022.   Status post spinal stenosis surgery- doing well. Need PT for shoulder.      Social History   Socioeconomic History  . Marital status: Widowed    Spouse name: Not on file  . Number of children: 2  . Years of education: HS  . Highest education level: Not on file  Occupational History  . Occupation: RETIRED    Employer: [employer]  Tobacco Use  . Smoking status: Never Smoker  . Smokeless tobacco: Never Used  Vaping Use  . Vaping Use: Never used  Substance and Sexual Activity  . Alcohol use: No    Alcohol/week: 0.0 standard drinks  . Drug use: Never  . Sexual activity: Not on file  Other Topics Concern  . Not on file  Social History Narrative   Patient is widowed.   Patient has two children.   Patient does not drink any caffeine.    Patient has a high school education.   Patient is right-handed.            Social Determinants of Health   Financial Resource Strain:  Not on file  Food Insecurity: Not on file  Transportation Needs: Not on file  Physical Activity: Not on file  Stress: Not on file  Social Connections: Not on file  Intimate Partner Violence: Not on file    Family History  Problem Relation Age of Onset  . CVA Father   . Tuberculosis Mother   . Lung cancer Brother     Past Medical History:  Diagnosis Date  . Arthritis   . Complication of anesthesia   . Constipation   . Depression   . GERD (gastroesophageal reflux disease)   . Hypertension   . Hypothyroidism   . Osteoporosis   . Pneumonia 2018      . Polymyalgia rheumatica (HCC) on Plaquenil, not prednisone/   . PONV (postoperative nausea and vomiting)     Past Surgical History:  Procedure Laterality Date  . COLONOSCOPY W/ POLYPECTOMY    . EYE SURGERY     both cataracts  . LUMBAR LAMINECTOMY/DECOMPRESSION MICRODISCECTOMY N/A 08/15/2018   Procedure: Lumbar three to Lumbar five Decompressive lumbar laminectomy;  Surgeon: Erline Levine, MD;  Location: [place];  Service: Neurosurgery;  Laterality: N/A;  . NASAL SINUS SURGERY    . SHOULDER SURGERY Right 2007   repair  . TONSILLECTOMY    . TRIGGER FINGER RELEASE  03/19/2011   Procedure:  left middle finger RELEASE TRIGGER FINGER/A-1 PULLEY;  Surgeon: Cammie Sickle., MD;  Location: [place];  Service: Orthopedics;  Laterality: Right;  Procedure:  Release Right Long and Ring Trigger Fingers, Release Left Long Trigger Finger, Injection Left Long Proximal Phalangeal Joint  . TRIGGER FINGER RELEASE Right    Ring finger, middle finger  . UPPER GASTROINTESTINAL ENDOSCOPY      Current Outpatient Medications  Medication Sig Dispense Refill  . acetaminophen (TYLENOL) 325 MG tablet Take 650 mg by mouth every 6 (six) hours as needed.    Marland Kitchen atorvastatin (LIPITOR) 10 MG tablet Take 10 mg by mouth daily.     . Calcium Citrate (CITRACAL PO) Take 1,300 mg by mouth daily. Taking it in the AM    . carvedilol (COREG) 3.125  MG tablet TAKE 1 TABLET(3.125 MG) BY MOUTH FOUR TIMES DAILY (Patient taking differently: Take 3.125 mg by mouth as needed. TAKE 1 TABLET(3.125 MG) BY MOUTH TWICE TIMES DAILY (when standing SBP >100)) 360 tablet 3  . [START ON 09/29/2020] clonazePAM (KLONOPIN) 0.5 MG tablet TAKE 1 TABLET AT BEDTIME 90 tablet 0  . denosumab (PROLIA) 60 MG/ML SOSY injection Inject 60 mg into the skin every 6 (six) months.    . fish oil-omega-3 fatty acids 1000 MG capsule Take 1 g by mouth daily.     . Garlic 0454 MG TBEC Take 1,000 mg by mouth daily. [place]    . hydrALAZINE (APRESOLINE) 25 MG tablet TAKE 1 TABLET BY MOUTH THREE TIMES DAILY AS NEEDED FOR SYSTOLIC BLOOD PRESSURE GREATER THAN 150 270 tablet 3  . hydroxychloroquine (PLAQUENIL) 200 MG tablet Take 1 tablet (200 mg total) by mouth daily. 90 tablet 0  . Iron-Vitamins (GERITOL PO) Take 1 tablet by mouth daily.     Marland Kitchen levothyroxine (SYNTHROID, LEVOTHROID) 50 MCG tablet Take 50 mcg by mouth daily before breakfast.     . Lifitegrast (XIIDRA OP) Apply 1 drop to eye 2 (two) times daily.    Marland Kitchen omeprazole (PRILOSEC) 20 MG capsule Take 20 mg by mouth daily.    . polyethylene glycol (MIRALAX / GLYCOLAX) packet Take 17 g by mouth daily as needed for moderate constipation.     . Probiotic Product (PROBIOTIC DAILY PO) Take 1 tablet by mouth daily. Culturelle    . Propylene Glycol (SYSTANE BALANCE OP) Apply 1 drop to eye in the morning and at bedtime.     No current facility-administered medications for this visit.    Allergies as of 08/05/2020  . (No Known Allergies)    Vitals: BP (!) 142/80   Pulse 74   Ht 5\' 4"  (1.626 m)   Wt 116 lb 8 oz (52.8 kg)   BMI 20.00 kg/m  Last Weight:  Wt Readings from Last 1 Encounters:  08/05/20 116 lb 8 oz (52.8 kg)   Last Height:   Ht Readings from Last 1 Encounters:  08/05/20 5\' 4"  (1.626 m)    Physical exam:  General: The patient is awake, alert and appears not in acute distress. The patient is well  groomed. Head: Normocephalic, atraumatic. Neck is supple. Mallampati 3 , retrognathia , neck circumference:14. 25". inches.  Cardiovascular:  Regular rate and rhythm , without  murmurs , has a left sided carotid bruit, no  distended neck veins. Respiratory: Lungs are clear to auscultation. Skin:  Without evidence of edema, or rash Trunk:  normal posture.  Neurologic exam : [facility] Cognitive Assessment  08/05/2020 07/09/2019 01/09/2019 01/30/2018 07/26/2017  Visuospatial/ Executive (0/5) 4 3 2 3 3   Naming (0/3) 3 2 3 3 3   Attention: Read list of digits (0/2) 2 2 1 2 2   Attention: Read list of letters (0/1) 1 1 1 1 1   Attention: Serial 7 subtraction starting at 100 (0/3) 3 3 3 3 3   Language: Repeat phrase (0/2) 0 2 2 2 1   Language : Fluency (0/1) 1 0 1 0 0  Abstraction (0/2) 2 2 2 2 2   Delayed Recall (0/5) 5 5 5 5 5   Orientation (0/6) 6 6 6 6 6   Total 27 26 26 27 26   Adjusted Score (based on education) 35 - - - -    The patient is awake and alert, oriented to place and time.  Memory see above; we did not repeat MOCA.  She reports some delay in word-finding. Auditory processing speed- she felt subjectively delayed last visit - today not at all- she appears to be happier, more confident. What ever Prevagen does or doesn't do , it's not harmful to her-There is a normal attention span & concentration ability.  Speech is fluent with mild hoarseness - and no aphasia .Mood and affect are appropriate. Cranial nerves: Pupils are equal and briskly reactive to light.  Hearing to finger rub intact. Facial sensation intact to fine touch. Facial motor strength is symmetric and tongue and uvula move again in midline.no tremor, no fasciculating.   Motor tone: normal, there is very little muscle mass, no cog wheeling, grip strength and hip flexion, adduction and abduction.  The hands hurt due to rheumatic condition.This more than anything restricts ROM and fluidity of movements.   Mrs. Lastra gait is  still very measured but not displaying difficulties with balance, she can turn with 3.5 steps 180 degrees, she does not have a drift ,no propulsive tendency was noted. She can walk tandem (!) . Her right foot points a bit more outwards. There is normal arm-swing.   She could rise from a seated position in a chair without bracing herself, there is no tremor noted normal arm swing. There is also no focal weakness, focal sensory loss and good coordination.   Handwriting and drawing do not reveal any changes   Cognitive testing as below was unremarkable. The patient performed a Montreal cognitive assessment again- which is more difficult than the Mini-Mental Status Examination- and mastered it again well.  She has Parasomnia with REM BD.  She has had back pain, She underwent L3-4 and L 4-5 severe stenosis surgery-  Buttocks feel still better, not as sore - sacroiliac pain rather than hip?   -No surgery for the neck was recommended and only massage was recommended.    ASSESSMENT: she has better Blood pressure control now, she feels relieved by selling her house and having moved.  She has new DIPLOPIA and needed a prism lens, which has corrected it. No myasthenia evident.    PLAN:   stay on Klonopin, low dose of 0.25 mg nightly, will increase to 0.5 mg thus increasing the threshold to sleep walking. What ever Prevagen does or doesn't do , it's not harmful to her-There is a normal attention span & concentration ability.  She had a recent fall out of bed - the first time at  [facility]- - for  which she takes Klonipin, and she suffered no injuries.   RV every 6 month with me- MOCA next time in November 2022.   Larey Seat, MD     07-09-2019.   Cc Dr Forde Dandy ,  Dr Einar Gip.  Dr Vertell Limber.

## 2020-08-05 NOTE — Patient Instructions (Signed)

## 2020-08-06 NOTE — Progress Notes (Signed)
Office Visit Note  Patient: Gloria Anderson             Date of Birth: 05-19-1933           MRN: [id number]             PCP: [name], MD Referring: [name], MD Visit Date: 08/20/2020 Occupation: @GUAROCC @  Subjective:  Medication management.   History of Present Illness: Gloria Anderson is a 87 y.o. female with a history of polymyalgia rheumatica, osteoarthritis and osteoporosis.  She denies any muscle weakness or muscle tenderness.  She continues to have some stiffness and discomfort in her joints due to underlying osteoarthritis.  She still have some discomfort in her left shoulder joint.  She states she has difficulty with closing her bra.  She denies any discomfort in her cervical and lumbar spine.  She has been getting Prolia injections through Dr. Forde Dandy.  She states that she fell off the bed while she was sleeping recently and landed up on her face and her knees.  She has some bruising which is improving.  Activities of Daily Living:  Patient reports morning stiffness for 0 minute.   Patient Denies nocturnal pain.  Difficulty dressing/grooming: Denies Difficulty climbing stairs: Denies Difficulty getting out of chair: Denies Difficulty using hands for taps, buttons, cutlery, and/or writing: Reports  Review of Systems  Constitutional:  Positive for fatigue.  HENT:  Negative for mouth sores, mouth dryness and nose dryness.   Eyes:  Negative for pain, itching and dryness.       Eye burning   Respiratory:  Negative for shortness of breath and difficulty breathing.   Cardiovascular:  Negative for chest pain and palpitations.  Gastrointestinal:  Positive for constipation. Negative for blood in stool and diarrhea.  Endocrine: Negative for increased urination.  Genitourinary:  Negative for difficulty urinating.  Musculoskeletal:  Positive for joint pain and joint pain. Negative for joint swelling, myalgias, morning stiffness, muscle tenderness and myalgias.   Skin:  Negative for rash, hair loss and redness.  Allergic/Immunologic: Negative for susceptible to infections.  Neurological:  Positive for dizziness, headaches and memory loss. Negative for numbness.  Hematological:  Negative for bruising/bleeding tendency.  Psychiatric/Behavioral:  Negative for confusion.    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   Nocturnal seizures ([place]) 05/23/2019   Adult night terrors 01/09/2019   Degenerative lumbar spinal stenosis 08/15/2018   Memory difficulty 07/26/2017   Other parasomnia 07/26/2017   Anemia 06/02/2017   Osteopenia 06/02/2017   Spinal stenosis 06/02/2017   Iron deficiency anemia secondary to inadequate dietary iron intake 08/05/2016   Fatigue associated with anemia 08/05/2016   Primary osteoarthritis of both feet 02/04/2016   Essential hypertension 02/04/2016   PMR (polymyalgia rheumatica) ([place]) 02/03/2016   Osteoarthritis, hand 02/03/2016   Age-related osteoporosis without current pathological fracture 02/03/2016   DDD (degenerative disc disease), lumbar 02/03/2016   High risk medication use 02/03/2016   Parasomnia, organic 08/04/2015   Parasomnia due to medical condition 02/03/2015   Cerebral cavernous malformation type 1 12/04/2013   Night terrors, adult 09/26/2013   HEMORRHOIDS-EXTERNAL 09/19/2009   GERD 09/19/2009   CONSTIPATION 09/19/2009   DYSPHAGIA 09/19/2009   PERSONAL HISTORY OF COLONIC POLYPS 09/19/2009    Past Medical History:  Diagnosis Date   Arthritis  Complication of anesthesia    Constipation    Depression    GERD (gastroesophageal reflux disease)    Hypertension    Hypothyroidism    Osteoporosis    Pneumonia 2018   Polymyalgia (HCC)    Polymyalgia rheumatica (HCC)    PONV  (postoperative nausea and vomiting)     Family History  Problem Relation Age of Onset   CVA Father    Tuberculosis Mother    Lung cancer Brother    Past Surgical History:  Procedure Laterality Date   COLONOSCOPY W/ POLYPECTOMY     EYE SURGERY     both cataracts   LUMBAR LAMINECTOMY/DECOMPRESSION MICRODISCECTOMY N/A 08/15/2018   Procedure: Lumbar three to Lumbar five Decompressive lumbar laminectomy;  Surgeon: Erline Levine, MD;  Location: [facility];  Service: Neurosurgery;  Laterality: N/A;   NASAL SINUS SURGERY     SHOULDER SURGERY Right 2007   repair   TONSILLECTOMY     TRIGGER FINGER RELEASE  03/19/2011   Procedure:  left middle finger RELEASE TRIGGER FINGER/A-1 PULLEY;  Surgeon: Cammie Sickle., MD;  Location: [facility];  Service: Orthopedics;  Laterality: Right;  Procedure:  Release Right Long and Ring Trigger Fingers, Release Left Long Trigger Finger, Injection Left Long Proximal Phalangeal Joint   TRIGGER FINGER RELEASE Right    Ring finger, middle finger   UPPER GASTROINTESTINAL ENDOSCOPY     Social History   Social History Narrative   Patient is widowed.   Patient has two children.   Patient does not drink any caffeine.    Patient has a high school education.   Patient is right-handed.            Immunization History  Administered Date(s) Administered   Influenza, High Dose Seasonal PF 12/19/2016   PFIZER(Purple Top)SARS-COV-2 Vaccination 03/24/2019, 04/13/2019, 11/29/2019   Zoster Recombinat (Shingrix) 08/01/2017, 10/10/2017     Objective: Vital Signs: BP 114/67 (BP Location: Left Arm, Patient Position: Sitting, Cuff Size: Normal)   Pulse 79   Ht 5\' 4"  (1.626 m)   Wt 119 lb 3.2 oz (54.1 kg)   BMI 20.46 kg/m    Physical Exam Vitals and nursing note reviewed.  Constitutional:      Appearance: She is well-developed.  HENT:     Head: Normocephalic and atraumatic.  Eyes:     Conjunctiva/sclera: Conjunctivae normal.  Cardiovascular:      Rate and Rhythm: Normal rate and regular rhythm.     Heart sounds: Normal heart sounds.  Pulmonary:     Effort: Pulmonary effort is normal.     Breath sounds: Normal breath sounds.  Abdominal:     General: Bowel sounds are normal.     Palpations: Abdomen is soft.  Musculoskeletal:     Cervical back: Normal range of motion.  Lymphadenopathy:     Cervical: No cervical adenopathy.  Skin:    General: Skin is warm and dry.     Capillary Refill: Capillary refill takes less than 2 seconds.  Neurological:     Mental Status: She is alert and oriented to person, place, and time.  Psychiatric:        Behavior: Behavior normal.     Musculoskeletal Exam: C-spine was in good range of motion.  Shoulder joints, elbow joints, wrist joints with good range of motion.  She had some discomfort range of motion of her left shoulder joint with internal rotation.  DIP and PIP thickening was noted.  Hip joints and knee joints with  good range of motion without any warmth or tenderness.  She had no tenderness over ankles or MTPs.  CDAI Exam: CDAI Score: -- Patient Global: --; Provider Global: -- Swollen: --; Tender: -- Joint Exam 08/20/2020   No joint exam has been documented for this visit   There is currently no information documented on the homunculus. Go to the Rheumatology activity and complete the homunculus joint exam.  Investigation: No additional findings.  Imaging: No results found.  Recent Labs: Lab Results  Component Value Date   WBC 4.9 03/21/2020   HGB 11.4 (L) 03/21/2020   PLT 186 03/21/2020   NA 139 03/21/2020   K 4.5 03/21/2020   CL 103 03/21/2020   CO2 29 03/21/2020   GLUCOSE 94 03/21/2020   BUN 27 (H) 03/21/2020   CREATININE 1.09 (H) 03/21/2020   BILITOT 1.2 03/21/2020   AST 25 03/21/2020   ALT 18 03/21/2020   PROT 6.8 03/21/2020   CALCIUM 9.7 03/21/2020   GFRAA 54 (L) 03/21/2020    Speciality Comments: PLQ eye exam: 10/16/2019 normal. Dr. Gershon Crane. Follow up in 1  year.  Procedures:  No procedures performed Allergies: Patient has no known allergies.   Assessment / Plan:     Visit Diagnoses: PMR (polymyalgia rheumatica) (HCC)-no muscular tenderness of weakness was noted.  High risk medication use - Plaquenil 200 mg p.o. daily.  I will check labs today which will include CBC with differential and comprehensive metabolic panel with GFR.  PLQ eye exam: 10/16/2019.  She will get repeat eye examination in August.  Chronic left shoulder pain -she continues to have some left shoulder joint discomfort especially with internal rotation.  She has done physical therapy in the past.  I have given her a handout on shoulder muscle strengthening exercises.  History of repair of right rotator cuff  Primary osteoarthritis of both hands-she complains of stiffness.  No synovitis was noted.  Primary osteoarthritis of both feet-she uses compression socks and proper wearing shoes.  DDD (degenerative disc disease), cervical-denies discomfort.  DDD (degenerative disc disease), lumbar-denies discomfort.  Age-related osteoporosis without current pathological fracture - her last bone density was on October 10, 2018.  Left femoral T score was -2.3, BMD 0.591.  She has been on Prolia by Dr. Forde Dandy.  Calcium intake, vitamin D and need for regular exercise was emphasized.  Chronic kidney disease, unspecified CKD stage - she is followed by nephrology.  Other headache syndrome  Night terrors, adult - followed by Dr. Brett Fairy.  She had a recent fall from the bed.  She states she landed up on her face and her knees.  She had bruising which is improving per patient.  History of hypertension - followed by Dr. Einar Gip.   Anxiety and depression  History of gastroesophageal reflux (GERD)  Orders: No orders of the defined types were placed in this encounter.  No orders of the defined types were placed in this encounter.   Follow-Up Instructions: Return in about 6 months (around  02/19/2021) for Polymyalgia rheumatica, Osteoarthritis.   Bo Merino, MD  Note - This record has been created using Editor, commissioning.  Chart creation errors have been sought, but may not always  have been located. Such creation errors do not reflect on  the standard of medical care.

## 2020-08-06 NOTE — Telephone Encounter (Signed)
Patient called regarding her Prolia today. She states she completed facial surgery for [facility] and has follow-up in ~3 weeks. She also states that she has not yet had dental surgery which will be extraction. She states dental surgery will be after post-op clearance of facial surgery.  She has been advised that dental surgery should be completed prior to resuming Prolia. Future lab orders for Prolia remain in place.  Knox Saliva, PharmD, MPH Clinical Pharmacist (Rheumatology and Pulmonology)

## 2020-08-20 ENCOUNTER — Other Ambulatory Visit: Payer: Self-pay

## 2020-08-20 ENCOUNTER — Encounter: Payer: Self-pay | Admitting: Rheumatology

## 2020-08-20 ENCOUNTER — Ambulatory Visit: Payer: Medicare HMO | Admitting: Rheumatology

## 2020-08-20 VITALS — BP 114/67 | HR 79 | Ht 64.0 in | Wt 119.2 lb

## 2020-08-20 DIAGNOSIS — M19071 Primary osteoarthritis, right ankle and foot: Secondary | ICD-10-CM | POA: Diagnosis not present

## 2020-08-20 DIAGNOSIS — Z79899 Other long term (current) drug therapy: Secondary | ICD-10-CM | POA: Diagnosis not present

## 2020-08-20 DIAGNOSIS — M5136 Other intervertebral disc degeneration, lumbar region: Secondary | ICD-10-CM

## 2020-08-20 DIAGNOSIS — Z9889 Other specified postprocedural states: Secondary | ICD-10-CM | POA: Diagnosis not present

## 2020-08-20 DIAGNOSIS — M51369 Other intervertebral disc degeneration, lumbar region without mention of lumbar back pain or lower extremity pain: Secondary | ICD-10-CM

## 2020-08-20 DIAGNOSIS — M19041 Primary osteoarthritis, right hand: Secondary | ICD-10-CM | POA: Diagnosis not present

## 2020-08-20 DIAGNOSIS — M19042 Primary osteoarthritis, left hand: Secondary | ICD-10-CM

## 2020-08-20 DIAGNOSIS — F32A Depression, unspecified: Secondary | ICD-10-CM

## 2020-08-20 DIAGNOSIS — Z8679 Personal history of other diseases of the circulatory system: Secondary | ICD-10-CM

## 2020-08-20 DIAGNOSIS — F419 Anxiety disorder, unspecified: Secondary | ICD-10-CM

## 2020-08-20 DIAGNOSIS — M25512 Pain in left shoulder: Secondary | ICD-10-CM

## 2020-08-20 DIAGNOSIS — M5030 Other cervical disc degeneration, unspecified cervical region: Secondary | ICD-10-CM

## 2020-08-20 DIAGNOSIS — M353 Polymyalgia rheumatica: Secondary | ICD-10-CM | POA: Diagnosis not present

## 2020-08-20 DIAGNOSIS — Z8719 Personal history of other diseases of the digestive system: Secondary | ICD-10-CM

## 2020-08-20 DIAGNOSIS — N189 Chronic kidney disease, unspecified: Secondary | ICD-10-CM

## 2020-08-20 DIAGNOSIS — G8929 Other chronic pain: Secondary | ICD-10-CM

## 2020-08-20 DIAGNOSIS — M810 Age-related osteoporosis without current pathological fracture: Secondary | ICD-10-CM

## 2020-08-20 DIAGNOSIS — M19072 Primary osteoarthritis, left ankle and foot: Secondary | ICD-10-CM

## 2020-08-20 DIAGNOSIS — G4489 Other headache syndrome: Secondary | ICD-10-CM

## 2020-08-20 DIAGNOSIS — F514 Sleep terrors [night terrors]: Secondary | ICD-10-CM

## 2020-08-20 NOTE — Patient Instructions (Addendum)
Shoulder Exercises Ask your health care provider which exercises are safe for you. Do exercises exactly as told by your health care provider and adjust them as directed. It is normal to feel mild stretching, pulling, tightness, or discomfort as you do these exercises. Stop right away if you feel sudden pain or your pain gets worse. Do not begin these exercises until told by your health care provider. Stretching exercises External rotation and abduction This exercise is sometimes called corner stretch. This exercise rotates your arm outward (external rotation) and moves your arm out from your body (abduction). Stand in a doorway with one of your feet slightly in front of the other. This is called a staggered stance. If you cannot reach your forearms to the door frame, stand facing a corner of a room. Choose one of the following positions as told by your health care provider: Place your hands and forearms on the door frame above your head. Place your hands and forearms on the door frame at the height of your head. Place your hands on the door frame at the height of your elbows. Slowly move your weight onto your front foot until you feel a stretch across your chest and in the front of your shoulders. Keep your head and chest upright and keep your abdominal muscles tight. Hold for __________ seconds. To release the stretch, shift your weight to your back foot. Repeat __________ times. Complete this exercise __________ times a day. Extension, standing Stand and hold a broomstick, a cane, or a similar object behind your back. Your hands should be a little wider than shoulder width apart. Your palms should face away from your back. Keeping your elbows straight and your shoulder muscles relaxed, move the stick away from your body until you feel a stretch in your shoulders (extension). Avoid shrugging your shoulders while you move the stick. Keep your shoulder blades tucked down toward the middle of your  back. Hold for __________ seconds. Slowly return to the starting position. Repeat __________ times. Complete this exercise __________ times a day. Range-of-motion exercises Pendulum  Stand near a wall or a surface that you can hold onto for balance. Bend at the waist and let your left / right arm hang straight down. Use your other arm to support you. Keep your back straight and do not lock your knees. Relax your left / right arm and shoulder muscles, and move your hips and your trunk so your left / right arm swings freely. Your arm should swing because of the motion of your body, not because you are using your arm or shoulder muscles. Keep moving your hips and trunk so your arm swings in the following directions, as told by your health care provider: Side to side. Forward and backward. In clockwise and counterclockwise circles. Continue each motion for __________ seconds, or for as long as told by your health care provider. Slowly return to the starting position. Repeat __________ times. Complete this exercise __________ times a day. Shoulder flexion, standing  Stand and hold a broomstick, a cane, or a similar object. Place your hands a little more than shoulder width apart on the object. Your left / right hand should be palm up, and your other hand should be palm down. Keep your elbow straight and your shoulder muscles relaxed. Push the stick up with your healthy arm to raise your left / right arm in front of your body, and then over your head until you feel a stretch in your shoulder (flexion). Avoid   shrugging your shoulder while you raise your arm. Keep your shoulder blade tucked down toward the middle of your back. Hold for __________ seconds. Slowly return to the starting position. Repeat __________ times. Complete this exercise __________ times a day. Shoulder abduction, standing Stand and hold a broomstick, a cane, or a similar object. Place your hands a little more than shoulder  width apart on the object. Your left / right hand should be palm up, and your other hand should be palm down. Keep your elbow straight and your shoulder muscles relaxed. Push the object across your body toward your left / right side. Raise your left / right arm to the side of your body (abduction) until you feel a stretch in your shoulder. Do not raise your arm above shoulder height unless your health care provider tells you to do that. If directed, raise your arm over your head. Avoid shrugging your shoulder while you raise your arm. Keep your shoulder blade tucked down toward the middle of your back. Hold for __________ seconds. Slowly return to the starting position. Repeat __________ times. Complete this exercise __________ times a day. Internal rotation  Place your left / right hand behind your back, palm up. Use your other hand to dangle an exercise band, a towel, or a similar object over your shoulder. Grasp the band with your left / right hand so you are holding on to both ends. Gently pull up on the band until you feel a stretch in the front of your left / right shoulder. The movement of your arm toward the center of your body is called internal rotation. Avoid shrugging your shoulder while you raise your arm. Keep your shoulder blade tucked down toward the middle of your back. Hold for __________ seconds. Release the stretch by letting go of the band and lowering your hands. Repeat __________ times. Complete this exercise __________ times a day. Strengthening exercises External rotation  Sit in a stable chair without armrests. Secure an exercise band to a stable object at elbow height on your left / right side. Place a soft object, such as a folded towel or a small pillow, between your left / right upper arm and your body to move your elbow about 4 inches (10 cm) away from your side. Hold the end of the exercise band so it is tight and there is no slack. Keeping your elbow pressed  against the soft object, slowly move your forearm out, away from your abdomen (external rotation). Keep your body steady so only your forearm moves. Hold for __________ seconds. Slowly return to the starting position. Repeat __________ times. Complete this exercise __________ times a day. Shoulder abduction  Sit in a stable chair without armrests, or stand up. Hold a __________ weight in your left / right hand, or hold an exercise band with both hands. Start with your arms straight down and your left / right palm facing in, toward your body. Slowly lift your left / right hand out to your side (abduction). Do not lift your hand above shoulder height unless your health care provider tells you that this is safe. Keep your arms straight. Avoid shrugging your shoulder while you do this movement. Keep your shoulder blade tucked down toward the middle of your back. Hold for __________ seconds. Slowly lower your arm, and return to the starting position. Repeat __________ times. Complete this exercise __________ times a day. Shoulder extension Sit in a stable chair without armrests, or stand up. Secure an exercise band   to a stable object in front of you so it is at shoulder height. Hold one end of the exercise band in each hand. Your palms should face each other. Straighten your elbows and lift your hands up to shoulder height. Step back, away from the secured end of the exercise band, until the band is tight and there is no slack. Squeeze your shoulder blades together as you pull your hands down to the sides of your thighs (extension). Stop when your hands are straight down by your sides. Do not let your hands go behind your body. Hold for __________ seconds. Slowly return to the starting position. Repeat __________ times. Complete this exercise __________ times a day. Shoulder row Sit in a stable chair without armrests, or stand up. Secure an exercise band to a stable object in front of you so it  is at waist height. Hold one end of the exercise band in each hand. Position your palms so that your thumbs are facing the ceiling (neutral position). Bend each of your elbows to a 90-degree angle (right angle) and keep your upper arms at your sides. Step back until the band is tight and there is no slack. Slowly pull your elbows back behind you. Hold for __________ seconds. Slowly return to the starting position. Repeat __________ times. Complete this exercise __________ times a day. Shoulder press-ups  Sit in a stable chair that has armrests. Sit upright, with your feet flat on the floor. Put your hands on the armrests so your elbows are bent and your fingers are pointing forward. Your hands should be about even with the sides of your body. Push down on the armrests and use your arms to lift yourself off the chair. Straighten your elbows and lift yourself up as much as you comfortably can. Move your shoulder blades down, and avoid letting your shoulders move up toward your ears. Keep your feet on the ground. As you get stronger, your feet should support less of your body weight as you lift yourself up. Hold for __________ seconds. Slowly lower yourself back into the chair. Repeat __________ times. Complete this exercise __________ times a day. Wall push-ups  Stand so you are facing a stable wall. Your feet should be about one arm-length away from the wall. Lean forward and place your palms on the wall at shoulder height. Keep your feet flat on the floor as you bend your elbows and lean forward toward the wall. Hold for __________ seconds. Straighten your elbows to push yourself back to the starting position. Repeat __________ times. Complete this exercise __________ times a day. This information is not intended to replace advice given to you by your health care provider. Make sure you discuss any questions you have with your healthcare provider. Document Revised: 06/09/2018 Document  Reviewed: 03/17/2018 Elsevier Patient Education  2022 [place] are taking a medication(s) that can suppress your immune system.  The following immunizations are recommended: Flu annually Covid-19  Td/Tdap (tetanus, diphtheria, pertussis) every 10 years Pneumonia (Prevnar 15 then Pneumovax 23 at least 1 year apart.  Alternatively, can take Prevnar 20 without needing additional dose) Shingrix (after age 50): 2 doses from 4 weeks to 6 months apart  Please check with your PCP to make sure you are up to date.

## 2020-08-21 LAB — COMPLETE METABOLIC PANEL WITH GFR
AG Ratio: 1.7 (calc) (ref 1.0–2.5)
ALT: 21 U/L (ref 6–29)
AST: 29 U/L (ref 10–35)
Albumin: 4.3 g/dL (ref 3.6–5.1)
Alkaline phosphatase (APISO): 43 U/L (ref 37–153)
BUN/Creatinine Ratio: 18 (calc) (ref 6–22)
BUN: 22 mg/dL (ref 7–25)
CO2: 28 mmol/L (ref 20–32)
Calcium: 9.6 mg/dL (ref 8.6–10.4)
Chloride: 100 mmol/L (ref 98–110)
Creat: 1.21 mg/dL — ABNORMAL HIGH (ref 0.60–0.88)
GFR, Est African American: 48 mL/min/{1.73_m2} — ABNORMAL LOW (ref >=60)
GFR, Est Non African American: 41 mL/min/{1.73_m2} — ABNORMAL LOW (ref >=60)
Globulin: 2.5 g/dL (calc) (ref 1.9–3.7)
Glucose, Bld: 128 mg/dL — ABNORMAL HIGH (ref 65–99)
Potassium: 4.7 mmol/L (ref 3.5–5.3)
Sodium: 138 mmol/L (ref 135–146)
Total Bilirubin: 1 mg/dL (ref 0.2–1.2)
Total Protein: 6.8 g/dL (ref 6.1–8.1)

## 2020-08-21 LAB — CBC WITH DIFFERENTIAL/PLATELET
Absolute Monocytes: 478 cells/uL (ref 200–950)
Basophils Absolute: 31 cells/uL (ref 0–200)
Basophils Relative: 0.6 %
Eosinophils Absolute: 99 cells/uL (ref 15–500)
Eosinophils Relative: 1.9 %
HCT: 33.2 % — ABNORMAL LOW (ref 35.0–45.0)
Hemoglobin: 11.1 g/dL — ABNORMAL LOW (ref 11.7–15.5)
Lymphs Abs: 1186 cells/uL (ref 850–3900)
MCH: 33.3 pg — ABNORMAL HIGH (ref 27.0–33.0)
MCHC: 33.4 g/dL (ref 32.0–36.0)
MCV: 99.7 fL (ref 80.0–100.0)
MPV: 10.2 fL (ref 7.5–12.5)
Monocytes Relative: 9.2 %
Neutro Abs: 3406 cells/uL (ref 1500–7800)
Neutrophils Relative %: 65.5 %
Platelets: 184 10*3/uL (ref 140–400)
RBC: 3.33 10*6/uL — ABNORMAL LOW (ref 3.80–5.10)
RDW: 12.1 % (ref 11.0–15.0)
Total Lymphocyte: 22.8 %
WBC: 5.2 10*3/uL (ref 3.8–10.8)

## 2020-08-21 NOTE — Progress Notes (Signed)
Hemoglobin is low and stable.  GFR is low and stable.  Please forward labs to her PCP and her nephrologist.

## 2020-09-09 ENCOUNTER — Encounter (HOSPITAL_COMMUNITY): Payer: Medicare HMO

## 2020-09-25 ENCOUNTER — Encounter: Payer: Self-pay | Admitting: Cardiology

## 2020-09-25 ENCOUNTER — Other Ambulatory Visit: Payer: Self-pay

## 2020-09-25 ENCOUNTER — Ambulatory Visit: Payer: Medicare HMO | Admitting: Cardiology

## 2020-09-25 VITALS — BP 166/75 | HR 94 | Temp 99.1°F | Resp 17 | Ht 64.0 in | Wt 118.2 lb

## 2020-09-25 DIAGNOSIS — I951 Orthostatic hypotension: Secondary | ICD-10-CM

## 2020-09-25 DIAGNOSIS — N1831 Chronic kidney disease, stage 3a: Secondary | ICD-10-CM

## 2020-09-25 DIAGNOSIS — I071 Rheumatic tricuspid insufficiency: Secondary | ICD-10-CM | POA: Diagnosis not present

## 2020-09-25 DIAGNOSIS — I10 Essential (primary) hypertension: Secondary | ICD-10-CM | POA: Diagnosis not present

## 2020-09-25 NOTE — Progress Notes (Signed)
Primary Physician/Referring:  [name], MD  Patient ID: Gloria Anderson, female    DOB: 1934/02/20, 87 y.o.   MRN: [id number]  Chief Complaint  Patient presents with   Hypertension   orthostatic hypotension    6 month   HPI:    Gloria Anderson  is a 87 y.o. 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  She is extremely sensitive to blood pressure variations.  She is also enrolled in   in Remote Patient Monitoring and Principal Care Management as patient is high risk for hospitalization and complications from underlying medical conditions.   This is a 83-monthoffice visit.  Except for occasional episodes of dizziness and palpitations, she has no specific complaints although she has written concerns that she brings with her today.  Continues to remain active.  She has not had any syncope or near syncope.  She does sleep on a wedge at night due to orthostatic hypertension.   Past Medical History:  Diagnosis Date   Arthritis    Complication of anesthesia    Constipation    Depression    GERD (gastroesophageal reflux disease)    Hypertension    Hypothyroidism    Osteoporosis    Pneumonia 2018   Polymyalgia ([facility]    Polymyalgia rheumatica (HCC)    PONV (postoperative nausea and vomiting)    Past Surgical History:  Procedure Laterality Date   COLONOSCOPY W/ POLYPECTOMY     EYE SURGERY     both cataracts   LUMBAR LAMINECTOMY/DECOMPRESSION MICRODISCECTOMY N/A 08/15/2018   Procedure: Lumbar three to Lumbar five Decompressive lumbar laminectomy;  Surgeon: SErline Levine MD;  [facility]  Service: Neurosurgery;  Laterality: N/A;   NASAL SINUS SURGERY     SHOULDER SURGERY Right 2007   repair   TONSILLECTOMY     TRIGGER FINGER RELEASE  03/19/2011   Procedure:  left middle finger RELEASE TRIGGER FINGER/A-1 PULLEY;  Surgeon: RCammie Sickle, MD;  [facility]  Service: Orthopedics;  Laterality: Right;  Procedure:  Release Right Long and Ring Trigger Fingers, Release Left Long Trigger Finger, Injection Left Long Proximal Phalangeal Joint   TRIGGER FINGER RELEASE Right    Ring finger, middle finger   UPPER GASTROINTESTINAL ENDOSCOPY     Social History   Tobacco Use   Smoking status: Never   Smokeless tobacco: Never  Substance Use Topics   Alcohol use: No    Alcohol/week: 0.0 standard drinks   ROS  Review of Systems  Cardiovascular:  Positive for palpitations. Negative for chest pain, dyspnea on exertion and leg swelling.  Gastrointestinal:  Negative for melena.  Neurological:  Positive for dizziness.  Psychiatric/Behavioral:  The patient is nervous/anxious (about health).   Objective  Blood pressure (!) 166/75, pulse 94, temperature 99.1 F (37.3 C), temperature source Temporal, resp. rate 17, height '5\' 4"'$  (1.626 m), weight 118 lb 3.2 oz (53.6 kg), SpO2 96 %.  Vitals with BMI 09/25/2020 08/20/2020 08/05/2020  Height '5\' 4"'$  '5\' 4"'$  '5\' 4"'$   Weight 118 lbs 3 oz 119 lbs 3 oz 116 lbs 8 oz  BMI 20.28 [id number] Systolic [id number] Diastolic 75 67 80  Pulse 94 79 74    Orthostatic VS for the past 72 hrs (Last 3 readings):  Orthostatic BP Patient Position BP Location Cuff Size Orthostatic Pulse  09/25/20 1259 139/73 Standing Left Arm Normal  69  09/25/20 1258 151/75 Sitting Left Arm Normal 80  09/25/20 1257 170/79 Supine Left Arm Normal 86    Physical Exam Vitals reviewed.  Constitutional:      Appearance: Normal appearance. She is well-developed.  Cardiovascular:     Rate and Rhythm: Normal rate and regular rhythm.     Pulses: Intact distal pulses.          Carotid pulses are  on the right side with bruit and  on the left side with bruit.      Femoral pulses are  on the right side with bruit and  on the left side with bruit.    Heart sounds: Murmur heard.  Crescendo-decrescendo mid to  late systolic murmur is present with a grade of 2/6 at the apex.  Pulmonary:     Effort: Pulmonary effort is normal. No accessory muscle usage or respiratory distress.     Breath sounds: Normal breath sounds.  Abdominal:     General: Abdomen is flat. Bowel sounds are normal. There is abdominal bruit.     Palpations: Abdomen is soft.  Neurological:     General: No focal deficit present.     Mental Status: She is alert and oriented to person, place, and time.   Laboratory examination:   Recent Labs    11/28/19 1511 03/21/20 1223 08/20/20 1544  NA 139 139 138  K 4.3 4.5 4.7  CL 102 103 100  CO2 '29 29 28  '$ GLUCOSE 86 94 128*  BUN 21 27* 22  CREATININE 1.24* 1.09* 1.21*  CALCIUM 10.0 9.7 9.6  GFRNONAA 40* 47* 41*  GFRAA 47* 54* 48*   CrCl cannot be calculated (Patient's most recent lab result is older than the maximum 21 days allowed.).  CMP Latest Ref Rng & Units 08/20/2020 03/21/2020 11/28/2019  Glucose 65 - 99 mg/dL 128(H) 94 86  BUN 7 - 25 mg/dL 22 27(H) 21  Creatinine 0.60 - 0.88 mg/dL 1.21(H) 1.09(H) 1.24(H)  Sodium 135 - 146 mmol/L 138 139 139  Potassium 3.5 - 5.3 mmol/L 4.7 4.5 4.3  Chloride 98 - 110 mmol/L 100 103 102  CO2 20 - 32 mmol/L '28 29 29  '$ Calcium 8.6 - 10.4 mg/dL 9.6 9.7 10.0  Total Protein 6.1 - 8.1 g/dL 6.8 6.8 6.9  Total Bilirubin 0.2 - 1.2 mg/dL 1.0 1.2 1.4(H)  AST 10 - 35 U/L '29 25 24  '$ ALT 6 - 29 U/L '21 18 17   '$ CBC Latest Ref Rng & Units 08/20/2020 03/21/2020 11/28/2019  WBC 3.8 - 10.8 Thousand/uL 5.2 4.9 4.8  Hemoglobin 11.7 - 15.5 g/dL 11.1(L) 11.4(L) 11.1(L)  Hematocrit 35.0 - 45.0 % 33.2(L) 33.5(L) 32.3(L)  Platelets 140 - 400 Thousand/uL 184 186 179   External labs: Cholesterol, total 162.000 m 05/26/2020 HDL 79.000 mg 05/26/2020 LDL 74.000 mg 05/26/2020 Triglycerides 43.000 mg 05/26/2020 A1C 4.900 % 05/26/2020  Medications and allergies  No Known Allergies   Current Outpatient Medications on File Prior to Visit  Medication Sig Dispense Refill    acetaminophen (TYLENOL) 325 MG tablet Take 650 mg by mouth every 6 (six) hours as needed.     Apoaequorin (PREVAGEN) 10 MG CAPS Take 10 mg by mouth daily after breakfast. 30 capsule 0   atorvastatin (LIPITOR) 10 MG tablet Take 10 mg by mouth daily.      Calcium Citrate (CITRACAL PO) Take 1,300 mg by mouth daily. Taking it in the AM     carvedilol (COREG) 3.125 MG tablet TAKE 1  TABLET(3.125 MG) BY MOUTH FOUR TIMES DAILY (Patient taking differently: Take 3.125 mg by mouth as needed. TAKE 1 TABLET(3.125 MG) BY MOUTH TWICE TIMES DAILY (when standing SBP >100)) 360 tablet 3   [START ON 09/29/2020] clonazePAM (KLONOPIN) 0.5 MG tablet Take 1 tablet (0.5 mg total) by mouth at bedtime. 90 tablet 0   denosumab (PROLIA) 60 MG/ML SOSY injection Inject 60 mg into the skin every 6 (six) months.     fish oil-omega-3 fatty acids 1000 MG capsule Take 1 g by mouth daily.      Garlic [id number] MG TBEC Take 1,000 mg by mouth daily. [place]     hydrALAZINE (APRESOLINE) 25 MG tablet TAKE 1 TABLET BY MOUTH THREE TIMES DAILY AS NEEDED FOR SYSTOLIC BLOOD PRESSURE GREATER THAN 150 270 tablet 3   hydroxychloroquine (PLAQUENIL) 200 MG tablet Take 1 tablet (200 mg total) by mouth daily. 90 tablet 0   Iron-Vitamins (GERITOL PO) Take 1 tablet by mouth daily.      levothyroxine (SYNTHROID, LEVOTHROID) 50 MCG tablet Take 50 mcg by mouth daily before breakfast.      Lifitegrast (XIIDRA OP) Apply 1 drop to eye 2 (two) times daily.     omeprazole (PRILOSEC) 20 MG capsule Take 20 mg by mouth daily.     polyethylene glycol (MIRALAX / GLYCOLAX) packet Take 17 g by mouth daily as needed for moderate constipation.      Probiotic Product (PROBIOTIC DAILY PO) Take 1 tablet by mouth daily. Culturelle     Propylene Glycol (SYSTANE BALANCE OP) Apply 1 drop to eye as needed.     No current facility-administered medications on file prior to visit.     Radiology:  No results found.  Cardiac Studies:   Exercise Treadmill Stress Test  10/07/2017:  Indication: chest pain The patient exercised on Bruce protocol for  06:19 min. Patient achieved  7.40 METS and reached HR  114 bpm, which is  82 % of maximum age-predicted HR.  Stress test terminated due to fatigue.    Exercise capacity was fair for age. HR Response to Exercise: Appropriate. BP Response to Exercise: Normal resting BP- appropriate response. Chest Pain: none. Arrhythmias: Occasional PVC s. Resting EKG demonstrates Normal sinus rhythm. ST Changes: With peak exercise there was no ST-T changes of ischemia.   Overall Impression:  Submaximal stress test with no ischemic changes. Continue primary/secondary prevention.   Echocardiogram 12/13/2018: Normal LV systolic function with EF 67%. Left ventricle  cavity is normal in size. Normal left ventricular wall  thickness. Normal global wall motion. Diastolic function  could not be assessed due to severity of mitral  regurgitation.  Moderate (Grade II) mitral regurgitation. Moderate tricuspid regurgitation. Moderate pulmonary hypertension. Estimated pulmonary artery systolic pressure is  50 mmHg.  Small circumferential pericardial effusion wth no hemodynamic  compromise.  IVC is dilated with a respiratory response of <50%. Estimated  RA pressure 10-15 mmHg. No significant change compared to previous study on  05/09/2018.  Carotid artery duplex  07/04/2019: No hemodynamically significant arterial disease in the internal carotid artery bilaterally. Antegrade right vertebral artery flow. Antegrade left vertebral artery flow.  EKG:    EKG 09/25/2020: Normal sinus rhythm at rate of 85 bpm, normal EKG.    Assessment     ICD-10-CM   1. Essential hypertension  I10 EKG 12-Lead    2. Orthostatic hypotension  I95.1     3. Stage 3a chronic kidney disease (HCC)  N18.31     4. Moderate tricuspid regurgitation  I07.1       No orders of the defined types were placed in this encounter.   There are no discontinued  medications.   Recommendations:   Gloria Anderson  is a 87 y.o. 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  She is extremely sensitive to blood pressure variations.  She presents for 22-monthoffice visit, has questions written down including palpitations, multiple episodes of low blood pressure but no syncope.  Overall she has done very well in managing her blood pressure issues.  She is wearing support stockings on a regular basis.  I reviewed her external labs and reassured her, lipids and excellent control, renal function has remained stable and blood pressure is well controlled.  She remains orthostatic, she is aware that she has to sleep reclined to reduce the risk of supine hypertension.  She has occasional palpitation, during auscultation I did hear occasional PACs or PVCs.  I simply reassured her.  No changes in the medications were done today, I will see her back in 6 months.  With regard to moderate MR and moderate TR and moderate pulm hypertension, with no clinical evidence of heart failure, no change in physical exam, I do not want to order any other testing.   I will see the patient back in 6 months.     JAdrian Prows MD, FEncompass Health Rehabilitation [facility]7/28/2022, 1:43 PM Office: [phone]: [phone]

## 2020-10-02 DIAGNOSIS — I071 Rheumatic tricuspid insufficiency: Secondary | ICD-10-CM | POA: Diagnosis not present

## 2020-10-02 DIAGNOSIS — N1832 Chronic kidney disease, stage 3b: Secondary | ICD-10-CM | POA: Diagnosis not present

## 2020-10-02 DIAGNOSIS — M353 Polymyalgia rheumatica: Secondary | ICD-10-CM | POA: Diagnosis not present

## 2020-10-02 DIAGNOSIS — I340 Nonrheumatic mitral (valve) insufficiency: Secondary | ICD-10-CM | POA: Diagnosis not present

## 2020-10-02 DIAGNOSIS — E785 Hyperlipidemia, unspecified: Secondary | ICD-10-CM | POA: Diagnosis not present

## 2020-10-02 DIAGNOSIS — I679 Cerebrovascular disease, unspecified: Secondary | ICD-10-CM | POA: Diagnosis not present

## 2020-10-02 DIAGNOSIS — I700 Atherosclerosis of aorta: Secondary | ICD-10-CM | POA: Diagnosis not present

## 2020-10-02 DIAGNOSIS — R7302 Impaired glucose tolerance (oral): Secondary | ICD-10-CM | POA: Diagnosis not present

## 2020-10-02 DIAGNOSIS — E039 Hypothyroidism, unspecified: Secondary | ICD-10-CM | POA: Diagnosis not present

## 2020-10-02 DIAGNOSIS — I129 Hypertensive chronic kidney disease with stage 1 through stage 4 chronic kidney disease, or unspecified chronic kidney disease: Secondary | ICD-10-CM | POA: Diagnosis not present

## 2020-10-08 DIAGNOSIS — L821 Other seborrheic keratosis: Secondary | ICD-10-CM | POA: Diagnosis not present

## 2020-10-08 DIAGNOSIS — D225 Melanocytic nevi of trunk: Secondary | ICD-10-CM | POA: Diagnosis not present

## 2020-10-08 DIAGNOSIS — L905 Scar conditions and fibrosis of skin: Secondary | ICD-10-CM | POA: Diagnosis not present

## 2020-10-08 DIAGNOSIS — Z85828 Personal history of other malignant neoplasm of skin: Secondary | ICD-10-CM | POA: Diagnosis not present

## 2020-10-08 DIAGNOSIS — D1801 Hemangioma of skin and subcutaneous tissue: Secondary | ICD-10-CM | POA: Diagnosis not present

## 2020-10-08 DIAGNOSIS — L218 Other seborrheic dermatitis: Secondary | ICD-10-CM | POA: Diagnosis not present

## 2020-10-09 ENCOUNTER — Other Ambulatory Visit: Payer: Self-pay | Admitting: Physician Assistant

## 2020-10-09 NOTE — Telephone Encounter (Signed)
Next Visit: 01/28/2021  Last Visit: 08/20/2020  Labs: 08/20/2020 Hemoglobin is low and stable.  GFR is low and stable.   Eye exam: 10/16/2019 normal.    Current Dose per office note 08/20/2020: Plaquenil 200 mg p.o. daily  DX:PMR (polymyalgia rheumatica)  Last Fill: 07/07/2020  Okay to refill Plaquenil?

## 2020-10-09 NOTE — Telephone Encounter (Signed)
Please remind patient to get eye examination.

## 2020-10-10 NOTE — Telephone Encounter (Signed)
Left message to remind patient she is due to update her PLQ eye exam.

## 2020-10-16 DIAGNOSIS — H524 Presbyopia: Secondary | ICD-10-CM | POA: Diagnosis not present

## 2020-10-16 DIAGNOSIS — M353 Polymyalgia rheumatica: Secondary | ICD-10-CM | POA: Diagnosis not present

## 2020-10-16 DIAGNOSIS — Z79899 Other long term (current) drug therapy: Secondary | ICD-10-CM | POA: Diagnosis not present

## 2020-10-16 DIAGNOSIS — H04123 Dry eye syndrome of bilateral lacrimal glands: Secondary | ICD-10-CM | POA: Diagnosis not present

## 2020-10-16 DIAGNOSIS — Z961 Presence of intraocular lens: Secondary | ICD-10-CM | POA: Diagnosis not present

## 2020-10-28 ENCOUNTER — Telehealth: Payer: Self-pay | Admitting: Cardiology

## 2020-10-28 NOTE — Telephone Encounter (Signed)
Patient had an appointment to have a tooth extracted yesterday, but they could not do it because her BP was elevated at 180/83. Patient's question is how much in advance can she take her carvedilol, so that her BP will not be so high. Please advise.

## 2020-10-28 NOTE — Telephone Encounter (Signed)
1 hour before

## 2020-10-28 NOTE — Telephone Encounter (Signed)
Pt is requesting a call from a MA

## 2020-10-29 NOTE — Telephone Encounter (Signed)
Patient aware.

## 2020-11-20 DIAGNOSIS — L218 Other seborrheic dermatitis: Secondary | ICD-10-CM | POA: Diagnosis not present

## 2020-11-28 ENCOUNTER — Ambulatory Visit: Payer: Medicare HMO

## 2020-12-01 ENCOUNTER — Other Ambulatory Visit: Payer: Self-pay

## 2020-12-01 ENCOUNTER — Ambulatory Visit: Payer: Medicare HMO | Attending: Endocrinology

## 2020-12-01 DIAGNOSIS — R490 Dysphonia: Secondary | ICD-10-CM | POA: Insufficient documentation

## 2020-12-02 NOTE — Therapy (Signed)
[facility] [address] [place], [place], [address] Phone: [phone]   Fax:  [phone]  Speech Language Pathology Evaluation  Patient Details  Name: Gloria Anderson MRN: [id number] Date of Birth: [birth year] Referring Provider (SLP): [name], MD   Encounter Date: 12/01/2020   End of Session - 12/02/20 0020     Visit Number 1    Number of Visits 1    Date for SLP Re-Evaluation 12/01/20    SLP Start Time 1450    SLP Stop Time  8341    SLP Time Calculation (min) 40 min    Activity Tolerance Patient tolerated treatment well             Past Medical History:  Diagnosis Date   Arthritis    Complication of anesthesia    Constipation    Depression    GERD (gastroesophageal reflux disease)    Hypertension    Hypothyroidism    Osteoporosis    Pneumonia 2018   Polymyalgia ([place])    Polymyalgia rheumatica ([place])    PONV (postoperative nausea and vomiting)     Past Surgical History:  Procedure Laterality Date   COLONOSCOPY W/ POLYPECTOMY     EYE SURGERY     both cataracts   LUMBAR LAMINECTOMY/DECOMPRESSION MICRODISCECTOMY N/A 08/15/2018   Procedure: Lumbar three to Lumbar five Decompressive lumbar laminectomy;  Surgeon: Erline Levine, MD;  Location: [facility];  Service: Neurosurgery;  Laterality: N/A;   NASAL SINUS SURGERY     SHOULDER SURGERY Right 2007   repair   TONSILLECTOMY     TRIGGER FINGER RELEASE  03/19/2011   Procedure:  left middle finger RELEASE TRIGGER FINGER/A-1 PULLEY;  Surgeon: Cammie Sickle., MD;  Location: [facility];  Service: Orthopedics;  Laterality: Right;  Procedure:  Release Right Long and Ring Trigger Fingers, Release Left Long Trigger Finger, Injection Left Long Proximal Phalangeal Joint   TRIGGER FINGER RELEASE Right    Ring finger, middle finger   UPPER GASTROINTESTINAL ENDOSCOPY      There were no vitals filed for this visit.   Subjective Assessment  - 12/01/20 1450     Subjective "It's been going on a while." Pt has not seen ENT for her voice. Saw Dr. Lucia Gaskins for sinuses. "Dr Forde Dandy didn't think it was concerning, but my friends say they can't hear me."    Currently in Pain? No/denies                SLP Evaluation [facility] - 12/01/20 1450       SLP Visit Information   SLP Received On 12/01/20    Referring Provider (SLP) [name], MD    Onset Date "Going on for a while"    Medical Diagnosis Hoarseness      Subjective   Subjective Arrives with rough sounding voice today, sounds as if volume is sub-normal.    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 Compared to MRI scan dated 09/17/2013 the cavernous angiomas appear to be stable but there appears to be mild progressive generalized cortical atrophy."   Mobility Status ambulated independently      Balance Screen  Has the patient fallen in the past 6 months No      Prior Functional Status   Cognitive/Linguistic Baseline Within functional limits      Cognition   Overall Cognitive Status Within Functional Limits for tasks assessed      Auditory Comprehension   Overall Auditory Comprehension Appears within functional limits for tasks assessed      Verbal Expression   Overall Verbal Expression Appears within functional limits for tasks assessed      Oral Motor/Sensory Function   Overall Oral Motor/Sensory Function Impaired    Labial ROM Within Functional Limits    Labial Symmetry Within Functional Limits    Labial Coordination Reduced    Lingual ROM Within Functional Limits    Lingual Symmetry Within Functional Limits    Lingual  Strength Reduced Right    Lingual Coordination Reduced      Motor Speech   Overall Motor Speech Impaired    Respiration Impaired    Level of Impairment Word    Phonation Low vocal intensity   "rough" voice, avg volume low-mid 60s dB (sub-normal), sust /a/ 13.11 seconds, s/z ratio=1.09 (WNL). When pt shouted "hey", the "rough" quality decr'd but a "raspy" tone was substituted. This is suspected due to pt changed to a different pattern of MTD.   Articulation --   mildly ataxic dysarthria   Intelligibility Intelligible    Motor Planning --   evidence of oral non-verbal apraxia   Motor Speech Errors Unaware;Inconsistent    Phonation Impaired    Vocal Abuses Other (comment)   suspected decr'd use of abdominal musculature when phonating   Pitch Low                             SLP Education - 12/01/20 2349     Education Details vocal hygiene, rationale for need for vocal fold visualization/ENT exam prior to engaging in specified voice therapy, need for abdominal breathing in vocalization, pt is using sub-normal volume habitually    Person(s) Educated Patient    Methods Explanation;Handout    Comprehension Verbalized understanding;Returned demonstration                  Plan - 12/02/20 0002     Clinical Impression Statement Gloria Anderson arrived today presenting with habitual sub-normal speaking volume with a predominant clavicular/chest breathing pattern. This would suggest less than normal use of abdominal/diaphragm musculature for phonation. Of note, pt also showed evidence of an oral nonverbal apraxia. Her voice was "rough" in quality, persistently, until SLP had pt produce loud "hey, you" at which time pt's "rough" quality changed to a "tinny", "throaty" quality. SLP had to coach pt over 6 reps using tactile cues of hand on her abdomen to feel for abdominal recruitment for a louder "hey you" than upper 60s dB, also lending weight to the hypythesis Gloria Anderson is using  less-than normal abdominal/diaphragmatic effort with voicing. Since she came to the eval today without an ENT exam, SLP STRONGLY recommended this prior to staring any focal ST. Today, SLP told pt we could work on more general but necessary voice therapy, such as abdominal breathing (pt with primary clavicular breathing pattern) and vocal hygiene measures while we waited for pt to see ENT. She stated at the end of the evaluaiton that she would like to be seen by the SLP that works with residents of her independent living facility, due to convenience. SLP offered any assistance necessary with setting  this up including contacting pt's PCP for suggestion of ENT exam. Since pt desires to be seen by another SLP she will not have any therapy treatment/interventions or treatment goals noted on this evaluation today.    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[place]) 05/23/2019   Nocturnal seizures ([place]) 05/23/2019   Adult night terrors 01/09/2019   Degenerative lumbar spinal stenosis 08/15/2018   Memory difficulty 07/26/2017   Other parasomnia 07/26/2017   Anemia 06/02/2017   Osteopenia 06/02/2017   Spinal stenosis 06/02/2017   Iron deficiency anemia secondary to inadequate dietary iron intake 08/05/2016   Fatigue associated with anemia 08/05/2016   Primary osteoarthritis of both feet 02/04/2016   Essential hypertension 02/04/2016   PMR (polymyalgia rheumatica) ([place]) 02/03/2016   Osteoarthritis, hand 02/03/2016    Age-related osteoporosis without current pathological fracture 02/03/2016   DDD (degenerative disc disease), lumbar 02/03/2016   High risk medication use 02/03/2016   Parasomnia, organic 08/04/2015   Parasomnia due to medical condition 02/03/2015   Cerebral cavernous malformation type 1 12/04/2013   Night terrors, adult 09/26/2013   HEMORRHOIDS-EXTERNAL 09/19/2009   GERD 09/19/2009   CONSTIPATION 09/19/2009   DYSPHAGIA 09/19/2009   PERSONAL HISTORY OF COLONIC POLYPS 09/19/2009    [place] ,[place], [place]  12/02/2020, 12:20 AM  [facility] [address] [place], [place], [address] Phone: [phone]   Fax:  [phone]  Name: Gloria Anderson MRN: [id number] Date of Birth: [birth year]

## 2020-12-02 NOTE — Patient Instructions (Signed)
VOICE CONSERVATION PROGRAM  1. Avoid overuse of voice or excessive use of the voice The vocal cords can become easily fatigued. Try to sort out what is "necessary" versus "unnecessary" talking in your environment. You do not need to STOP talking, but try to limit it as much as possible.  Think of resting your voice just as long as you talk. For example, if you talk for 5 minutes, rest your voice completely for 5 minutes. If your voice feels "tired" during the day, try to rest it as much as possible.  Avoid using an excessively loud voice or shouting/raising your voice When you yell or raise your voice, the vocal cords slam into each other, much like a strong hand clap. This causes irritation, and if this irritation continues, hoarseness may increase. If people in your home talk loudly, ask them to reduce the volume of their voices to help you decrease your volume as well. Sit near or face the person to whom you are speaking.   3. Avoid talking over background noise When talking in background noise, speech automatically has increased loudness, and as in number 2 above, continued loud speech can result in increased hoarseness due to irritation to the vocal cords.  Do not talk over the radio or the TV. Mute them before speaking, go to a quieter place to talk, or sit next to the person with whom you are watching.  4. Talk in a voice that is soft, smooth, and gentle This allows the vocal cords to come together in a gentle way and allows the air being exhaled from the lungs to do most/all of the work when you are speaking.  5. Avoid excessive throat clearing, coughing, and loud laughter During these activities the vocal cords can also be slammed together in a hard way and foster irritation or swelling, which can cause hoarseness. Try taking sips of room temperature/cool water with hard swallows to clear secretions from the throat, instead of throat clearing or coughing. If you absolutely must  clear your throat, do so as gently as possible. If you find yourself clearing your throat or coughing a lot, consult your physician. You will want to laugh. Continue to do so, but softly and gently. Do not laugh loudly for long periods of time because this can increase the chances for vocal fold irritation and thus hoarseness. Lozenges can help reduce the need to clear throat/cough during cold/allergy season.  6. Keep the mouth and throat lubricated Drink at least 8-10 8 oz. glasses of water per day (64-80 oz.). This water can come in the form of drink mixes.  Caffeinated beverages such as colas, coffee, and tea (hot tea AND sweet tea) dry out the vocal cords and then can cause irritation and thus hoarseness.  Drinking water will keep your body hydrated. This will help to decrease secretions in the throat, which cause people to clear their throats or cough. If you are exercising outside (especially during drier weather), try to breathe through your nose as much as possible. If this is not possible, lifting the tongue up behind the upper teeth when breathing through the mouth will add some moisture to the air breathed in past the vocal cords.  7. Avoid mouth breathing - breathe through your nose Your nose serves as a natural filter for dust and dirt particles from the air, and as a humidifier to moisten the air. Vocal cords like to work in moistened, filtered air. When you breathe through your mouth you lose   the air filtering and moistening benefits of breathing through the nose. Be aware of breathing patterns when sitting quietly (e.g., reading or watching TV). Increase your awareness and try to change habits from mouth breathing to nose breathing during those times.  8. Avoid environmental and/or ingested irritants Try to avoid smoke-filled and or dusty environments. These items dry out the vocal cords and cause irritation.  9. Use an air filter if the home is dusty, and/or a humidifier if the air  is dry  This will help to maintain clean, humid air to breathe. Remember, this type of air is what the vocal cords like best.  

## 2020-12-22 ENCOUNTER — Ambulatory Visit: Payer: Medicare HMO | Admitting: Cardiology

## 2020-12-22 NOTE — Progress Notes (Deleted)
Primary Physician/Referring:  [name], MD  Patient ID: Gloria Anderson, female    DOB: 11-Sep-1933, 87 y.o.   MRN: [id number]  No chief complaint on file.  HPI:    Gloria Anderson  is a 87 y.o. 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  She is extremely sensitive to blood pressure variations.  She is also enrolled in   in Remote Patient Monitoring and Principal Care Management as patient is high risk for hospitalization and complications from underlying medical conditions.   This is a 26-month office visit.  Except for occasional episodes of dizziness and palpitations, she has no specific complaints although she has written concerns that she brings with her today.  Continues to remain active.  She has not had any syncope or near syncope.  She does sleep on a wedge at night due to orthostatic hypertension.   Past Medical History:  Diagnosis Date   Arthritis    Complication of anesthesia    Constipation    Depression    GERD (gastroesophageal reflux disease)    Hypertension    Hypothyroidism    Osteoporosis    Pneumonia 2018   Polymyalgia ([place])    Polymyalgia rheumatica ([facility])    PONV (postoperative nausea and vomiting)    Past Surgical History:  Procedure Laterality Date   COLONOSCOPY W/ POLYPECTOMY     EYE SURGERY     both cataracts   LUMBAR LAMINECTOMY/DECOMPRESSION MICRODISCECTOMY N/A 08/15/2018   Procedure: Lumbar three to Lumbar five Decompressive lumbar laminectomy;  Surgeon: Erline Levine, MD;  Location: [facility];  Service: Neurosurgery;  Laterality: N/A;   NASAL SINUS SURGERY     SHOULDER SURGERY Right 2007   repair   TONSILLECTOMY     TRIGGER FINGER RELEASE  03/19/2011   Procedure:  left middle finger RELEASE TRIGGER FINGER/A-1 PULLEY;  Surgeon: Cammie Sickle., MD;  Location: [facility];  Service:  Orthopedics;  Laterality: Right;  Procedure:  Release Right Long and Ring Trigger Fingers, Release Left Long Trigger Finger, Injection Left Long Proximal Phalangeal Joint   TRIGGER FINGER RELEASE Right    Ring finger, middle finger   UPPER GASTROINTESTINAL ENDOSCOPY     Social History   Tobacco Use   Smoking status: Never   Smokeless tobacco: Never  Substance Use Topics   Alcohol use: No    Alcohol/week: 0.0 standard drinks   ROS  Review of Systems  Cardiovascular:  Positive for palpitations. Negative for chest pain, dyspnea on exertion and leg swelling.  Gastrointestinal:  Negative for melena.  Neurological:  Positive for dizziness.  Psychiatric/Behavioral:  The patient is nervous/anxious (about health).   Objective  There were no vitals taken for this visit.  Vitals with BMI 09/25/2020 08/20/2020 08/05/2020  Height 5\' 4"  5\' 4"  5\' 4"   Weight 118 lbs 3 oz 119 lbs 3 oz 116 lbs 8 oz  BMI 20.28 29.79 89.21  Systolic 194 174 081  Diastolic 75 67 80  Pulse 94 79 74    No data found.   Physical Exam Vitals reviewed.  Constitutional:      Appearance: Normal appearance. She is well-developed.  Cardiovascular:     Rate and Rhythm: Normal rate and regular rhythm.     Pulses: Intact distal pulses.          Carotid pulses are  on the right side with bruit and  on the  left side with bruit.      Femoral pulses are  on the right side with bruit and  on the left side with bruit.    Heart sounds: Murmur heard.  Crescendo-decrescendo mid to late systolic murmur is present with a grade of 2/6 at the apex.  Pulmonary:     Effort: Pulmonary effort is normal. No accessory muscle usage or respiratory distress.     Breath sounds: Normal breath sounds.  Abdominal:     General: Abdomen is flat. Bowel sounds are normal. There is abdominal bruit.     Palpations: Abdomen is soft.  Neurological:     General: No focal deficit present.     Mental Status: She is alert and oriented to person, place, and  time.   Laboratory examination:   Recent Labs    03/21/20 1223 08/20/20 1544  NA 139 138  K 4.5 4.7  CL 103 100  CO2 29 28  GLUCOSE 94 128*  BUN 27* 22  CREATININE 1.09* 1.21*  CALCIUM 9.7 9.6  GFRNONAA 47* 41*  GFRAA 54* 48*   CrCl cannot be calculated (Patient's most recent lab result is older than the maximum 21 days allowed.).  CMP Latest Ref Rng & Units 08/20/2020 03/21/2020 11/28/2019  Glucose 65 - 99 mg/dL 128(H) 94 86  BUN 7 - 25 mg/dL 22 27(H) 21  Creatinine 0.60 - 0.88 mg/dL 1.21(H) 1.09(H) 1.24(H)  Sodium 135 - 146 mmol/L 138 139 139  Potassium 3.5 - 5.3 mmol/L 4.7 4.5 4.3  Chloride 98 - 110 mmol/L 100 103 102  CO2 20 - 32 mmol/L 28 29 29   Calcium 8.6 - 10.4 mg/dL 9.6 9.7 10.0  Total Protein 6.1 - 8.1 g/dL 6.8 6.8 6.9  Total Bilirubin 0.2 - 1.2 mg/dL 1.0 1.2 1.4(H)  AST 10 - 35 U/L 29 25 24   ALT 6 - 29 U/L 21 18 17    CBC Latest Ref Rng & Units 08/20/2020 03/21/2020 11/28/2019  WBC 3.8 - 10.8 Thousand/uL 5.2 4.9 4.8  Hemoglobin 11.7 - 15.5 g/dL 11.1(L) 11.4(L) 11.1(L)  Hematocrit 35.0 - 45.0 % 33.2(L) 33.5(L) 32.3(L)  Platelets 140 - 400 Thousand/uL 184 186 179   External labs: Cholesterol, total 162.000 m 05/26/2020 HDL 79.000 mg 05/26/2020 LDL 74.000 mg 05/26/2020 Triglycerides 43.000 mg 05/26/2020 A1C 4.900 % 05/26/2020  Medications and allergies  No Known Allergies   Current Outpatient Medications on File Prior to Visit  Medication Sig Dispense Refill   acetaminophen (TYLENOL) 325 MG tablet Take 650 mg by mouth every 6 (six) hours as needed.     Apoaequorin (PREVAGEN) 10 MG CAPS Take 10 mg by mouth daily after breakfast. 30 capsule 0   atorvastatin (LIPITOR) 10 MG tablet Take 10 mg by mouth daily.      Calcium Citrate (CITRACAL PO) Take 1,300 mg by mouth daily. Taking it in the AM     carvedilol (COREG) 3.125 MG tablet TAKE 1 TABLET(3.125 MG) BY MOUTH FOUR TIMES DAILY (Patient taking differently: Take 3.125 mg by mouth as needed. TAKE 1 TABLET(3.125 MG) BY  MOUTH TWICE TIMES DAILY (when standing SBP >100)) 360 tablet 3   clonazePAM (KLONOPIN) 0.5 MG tablet Take 1 tablet (0.5 mg total) by mouth at bedtime. 90 tablet 0   denosumab (PROLIA) 60 MG/ML SOSY injection Inject 60 mg into the skin every 6 (six) months.     fish oil-omega-3 fatty acids 1000 MG capsule Take 1 g by mouth daily.      Garlic 0272 MG  TBEC Take 1,000 mg by mouth daily. [place]     hydrALAZINE (APRESOLINE) 25 MG tablet TAKE 1 TABLET BY MOUTH THREE TIMES DAILY AS NEEDED FOR SYSTOLIC BLOOD PRESSURE GREATER THAN 150 270 tablet 3   hydroxychloroquine (PLAQUENIL) 200 MG tablet TAKE 1 TABLET DAILY 90 tablet 0   Iron-Vitamins (GERITOL PO) Take 1 tablet by mouth daily.      levothyroxine (SYNTHROID, LEVOTHROID) 50 MCG tablet Take 50 mcg by mouth daily before breakfast.      Lifitegrast (XIIDRA OP) Apply 1 drop to eye 2 (two) times daily.     omeprazole (PRILOSEC) 20 MG capsule Take 20 mg by mouth daily.     polyethylene glycol (MIRALAX / GLYCOLAX) packet Take 17 g by mouth daily as needed for moderate constipation.      Probiotic Product (PROBIOTIC DAILY PO) Take 1 tablet by mouth daily. Culturelle     Propylene Glycol (SYSTANE BALANCE OP) Apply 1 drop to eye as needed.     No current facility-administered medications on file prior to visit.     Radiology:  No results found.  Cardiac Studies:   Exercise Treadmill Stress Test 10/07/2017:  Indication: chest pain The patient exercised on Bruce protocol for  06:19 min. Patient achieved  7.40 METS and reached HR  114 bpm, which is  82 % of maximum age-predicted HR.  Stress test terminated due to fatigue.    Exercise capacity was fair for age. HR Response to Exercise: Appropriate. BP Response to Exercise: Normal resting BP- appropriate response. Chest Pain: none. Arrhythmias: Occasional PVC s. Resting EKG demonstrates Normal sinus rhythm. ST Changes: With peak exercise there was no ST-T changes of ischemia.   Overall  Impression:  Submaximal stress test with no ischemic changes. Continue primary/secondary prevention.   Echocardiogram 12/13/2018: Normal LV systolic function with EF 67%. Left ventricle  cavity is normal in size. Normal left ventricular wall  thickness. Normal global wall motion. Diastolic function  could not be assessed due to severity of mitral  regurgitation.  Moderate (Grade II) mitral regurgitation. Moderate tricuspid regurgitation. Moderate pulmonary hypertension. Estimated pulmonary artery systolic pressure is  50 mmHg.  Small circumferential pericardial effusion wth no hemodynamic  compromise.  IVC is dilated with a respiratory response of <50%. Estimated  RA pressure 10-15 mmHg. No significant change compared to previous study on  05/09/2018.  Carotid artery duplex  07/04/2019: No hemodynamically significant arterial disease in the internal carotid artery bilaterally. Antegrade right vertebral artery flow. Antegrade left vertebral artery flow.  EKG:    EKG 09/25/2020: Normal sinus rhythm at rate of 85 bpm, normal EKG.    Assessment     ICD-10-CM   1. Essential hypertension  I10     2. Orthostatic hypotension  I95.1       No orders of the defined types were placed in this encounter.   There are no discontinued medications.   Recommendations:   Gloria Anderson  is a 87 y.o. 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  She is extremely sensitive to blood pressure variations.  She presents for 40-month office visit, has questions written down including palpitations, multiple episodes of low blood pressure but no syncope.  Overall she has done very well in managing her blood pressure issues.  She is wearing support stockings on a regular basis.  I reviewed her external labs and reassured her, lipids and excellent control,  renal function has  remained stable and blood pressure is well controlled.  She remains orthostatic, she is aware that she has to sleep reclined to reduce the risk of supine hypertension.  She has occasional palpitation, during auscultation I did hear occasional PACs or PVCs.  I simply reassured her.  No changes in the medications were done today, I will see her back in 6 months.  With regard to moderate MR and moderate TR and moderate pulm hypertension, with no clinical evidence of heart failure, no change in physical exam, I do not want to order any other testing.   I will see the patient back in 6 months.     Adrian Prows, MD, [facility] 12/22/2020, 12:30 PM Office: [phone] Pager: [phone]

## 2021-01-07 ENCOUNTER — Other Ambulatory Visit: Payer: Self-pay | Admitting: Rheumatology

## 2021-01-07 DIAGNOSIS — H9201 Otalgia, right ear: Secondary | ICD-10-CM | POA: Diagnosis not present

## 2021-01-07 DIAGNOSIS — J383 Other diseases of vocal cords: Secondary | ICD-10-CM | POA: Diagnosis not present

## 2021-01-07 NOTE — Telephone Encounter (Signed)
Next Visit: 01/28/2021  Last Visit: 08/20/2020  Labs: 08/20/2020 Hemoglobin is low and stable.  GFR is low and stable.   Eye exam: 10/16/2020 normal   Current Dose per office note 08/20/2020: Plaquenil 200 mg p.o. daily  DX: PMR (polymyalgia rheumatica)  Last Fill: 10/10/2020  Okay to refill Plaquenil?

## 2021-01-14 NOTE — Progress Notes (Signed)
Office Visit Note  Patient: Gloria Anderson             Date of Birth: 1933/05/01           MRN: [id number]             PCP: [name], MD Referring: [name], MD Visit Date: 01/28/2021 Occupation: @GUAROCC @  Subjective:  Medication monitoring.   History of Present Illness: Lanecia Sliva is a 87 y.o. female with a history of polymyalgia rheumatica, osteoarthritis, degenerative disc disease and osteoporosis.  She states she continues to have some discomfort in her bilateral quads.  She does not notice any increased muscle weakness or tenderness.  She has no difficulty raising her arms.  She has some discomfort in her left shoulder due to tendinitis.  She states she has been active.  She feels lonely in the independent living center.  She states she has been quite depressed recently.  She misses her family.  She has been experiencing hoarseness.  She has an appointment with the speech pathologist.  Activities of Daily Living:  Patient reports morning stiffness for 0 minutes.   Patient Denies nocturnal pain.  Difficulty dressing/grooming: Denies Difficulty climbing stairs: Denies Difficulty getting out of chair: Denies Difficulty using hands for taps, buttons, cutlery, and/or writing: Reports  Review of Systems  Constitutional:  Positive for fatigue.  HENT:  Positive for nose dryness. Negative for mouth sores and mouth dryness.   Eyes:  Positive for dryness. Negative for pain and itching.  Respiratory:  Negative for shortness of breath and difficulty breathing.   Cardiovascular:  Negative for chest pain and palpitations.  Gastrointestinal:  Negative for blood in stool, constipation and diarrhea.  Endocrine: Negative for increased urination.  Genitourinary:  Negative for difficulty urinating.  Musculoskeletal:  Negative for joint pain, joint pain, joint swelling, myalgias, morning stiffness, muscle tenderness and myalgias.  Skin:  Negative for color change, rash and  redness.  Allergic/Immunologic: Negative for susceptible to infections.  Neurological:  Positive for dizziness, headaches and memory loss. Negative for numbness.  Hematological:  Negative for bruising/bleeding tendency.  Psychiatric/Behavioral:  Positive for depressed mood, confusion and sleep disturbance. Negative for suicidal ideas. The patient is nervous/anxious.    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[place]) 05/23/2019   Nocturnal seizures ([place]) 05/23/2019   Adult night terrors 01/09/2019   Degenerative lumbar spinal stenosis 08/15/2018   Memory difficulty 07/26/2017   Other parasomnia 07/26/2017   Anemia 06/02/2017   Osteopenia 06/02/2017   Spinal stenosis 06/02/2017   Iron deficiency anemia secondary to inadequate dietary iron intake 08/05/2016   Fatigue associated with anemia 08/05/2016   Primary osteoarthritis of both feet 02/04/2016   Essential hypertension 02/04/2016   PMR (polymyalgia rheumatica) ([place]) 02/03/2016   Osteoarthritis, hand 02/03/2016   Age-related osteoporosis without current pathological fracture 02/03/2016   DDD (degenerative disc disease), lumbar 02/03/2016   High risk medication use 02/03/2016   Parasomnia, organic 08/04/2015   Parasomnia due to medical condition 02/03/2015   Cerebral cavernous malformation type 1 12/04/2013   Night terrors, adult 09/26/2013   HEMORRHOIDS-EXTERNAL 09/19/2009   GERD 09/19/2009   CONSTIPATION 09/19/2009   DYSPHAGIA 09/19/2009   PERSONAL HISTORY OF COLONIC POLYPS 09/19/2009    Past Medical History:  Diagnosis Date   Arthritis  Complication of anesthesia    Constipation    Depression    GERD (gastroesophageal reflux disease)    Hypertension    Hypothyroidism    Osteoporosis    Pneumonia 2018    Polymyalgia (HCC)    Polymyalgia rheumatica (HCC)    PONV (postoperative nausea and vomiting)     Family History  Problem Relation Age of Onset   Tuberculosis Mother 65   CVA Father    Stroke Father 93   Hypertension Sister 62   Lung cancer Brother 68   Past Surgical History:  Procedure Laterality Date   BASAL CELL CARCINOMA EXCISION  2022   on face   COLONOSCOPY W/ POLYPECTOMY     EYE SURGERY     both cataracts   LUMBAR LAMINECTOMY/DECOMPRESSION MICRODISCECTOMY N/A 08/15/2018   Procedure: Lumbar three to Lumbar five Decompressive lumbar laminectomy;  Surgeon: Erline Levine, MD;  Location: [facility];  Service: Neurosurgery;  Laterality: N/A;   NASAL SINUS SURGERY     SHOULDER SURGERY Right 2007   repair   TONSILLECTOMY     TRIGGER FINGER RELEASE  03/19/2011   Procedure:  left middle finger RELEASE TRIGGER FINGER/A-1 PULLEY;  Surgeon: Cammie Sickle., MD;  Location: [facility];  Service: Orthopedics;  Laterality: Right;  Procedure:  Release Right Long and Ring Trigger Fingers, Release Left Long Trigger Finger, Injection Left Long Proximal Phalangeal Joint   TRIGGER FINGER RELEASE Right    Ring finger, middle finger   UPPER GASTROINTESTINAL ENDOSCOPY     Social History   Social History Narrative   Patient is widowed.   Patient has two children.   Patient does not drink any caffeine.    Patient has a high school education.   Patient is right-handed.            Immunization History  Administered Date(s) Administered   Influenza Split 12/01/2010, 11/12/2011, 12/13/2011, 12/12/2012, 12/24/2013, 11/30/2019   Influenza, High Dose Seasonal PF 12/07/2015, 12/19/2016, 11/30/2017   Influenza, Quadrivalent, Recombinant, Inj, Pf 10/31/2018   Influenza,inj,Quad PF,6+ Mos 12/24/2013, 11/07/2014   PFIZER Comirnaty(Gray Top)Covid-19 Tri-Sucrose Vaccine 08/23/2020   PFIZER(Purple Top)SARS-COV-2 Vaccination 03/24/2019, 04/13/2019, 11/29/2019   Pneumococcal Conjugate-13  07/21/2016   Pneumococcal Polysaccharide-23 01/01/2009, 01/12/2013   Td,absorbed, Preservative Free, Adult Use, Lf Unspecified 10/18/2011   Tdap 06/05/2013   Zoster Recombinat (Shingrix) 08/01/2017, 10/10/2017   Zoster, Live 07/22/2010, 08/31/2010, 10/10/2017     Objective: Vital Signs: BP 117/69 (BP Location: Left Arm, Patient Position: Standing, Cuff Size: Normal)   Pulse 83   Ht 5\' 4"  (1.626 m)   Wt 119 lb (54 kg)   BMI 20.43 kg/m    Physical Exam Vitals and nursing note reviewed.  Constitutional:      Appearance: She is well-developed.  HENT:     Head: Normocephalic and atraumatic.  Eyes:     Conjunctiva/sclera: Conjunctivae normal.  Cardiovascular:     Rate and Rhythm: Normal rate and regular rhythm.     Heart sounds: Normal heart sounds.  Pulmonary:     Effort: Pulmonary effort is normal.     Breath sounds: Normal breath sounds.  Abdominal:     General: Bowel sounds are normal.     Palpations: Abdomen is soft.  Musculoskeletal:     Cervical back: Normal range of motion.  Lymphadenopathy:     Cervical: No cervical adenopathy.  Skin:    General: Skin is warm and dry.     Capillary Refill: Capillary refill takes less than  2 seconds.  Neurological:     Mental Status: She is alert and oriented to person, place, and time.  Psychiatric:        Behavior: Behavior normal.     Musculoskeletal Exam: She is good range of motion of the cervical spine with some stiffness.  Shoulder joints with good range of motion with discomfort in her left shoulder.  Elbow joints, wrist joints with good range of motion.  She had bilateral PIP and DIP thickening with decreased range of motion of her PIPs and DIPs.  Hip joints and knee joints in good range of motion.  She had no tenderness over ankles or MTPs.  CDAI Exam: CDAI Score: -- Patient Global: --; Provider Global: -- Swollen: --; Tender: -- Joint Exam 01/28/2021   No joint exam has been documented for this visit   There is  currently no information documented on the homunculus. Go to the Rheumatology activity and complete the homunculus joint exam.  Investigation: No additional findings.  Imaging: No results found.  Recent Labs: Lab Results  Component Value Date   WBC 5.2 08/20/2020   HGB 11.1 (L) 08/20/2020   PLT 184 08/20/2020   NA 138 08/20/2020   K 4.7 08/20/2020   CL 100 08/20/2020   CO2 28 08/20/2020   GLUCOSE 128 (H) 08/20/2020   BUN 22 08/20/2020   CREATININE 1.21 (H) 08/20/2020   BILITOT 1.0 08/20/2020   AST 29 08/20/2020   ALT 21 08/20/2020   PROT 6.8 08/20/2020   CALCIUM 9.6 08/20/2020   GFRAA 48 (L) 08/20/2020    Speciality Comments: PLQ eye exam: 10/16/2020 normal. Dr. Gershon Crane. Follow up in 1 year.   Procedures:  No procedures performed Allergies: Patient has no known allergies.   Assessment / Plan:     Visit Diagnoses: PMR (polymyalgia rheumatica) (HCC)-she denies any increased muscular weakness or tenderness.  She complains of some discomfort in bilateral quads.  No muscular weakness was noted.  She was able to get up from the squatting position.  High risk medication use - Plaquenil 200 mg p.o. daily. PLQ eye exam: 10/16/2020 - Plan: CBC with Differential/Platelet, COMPLETE METABOLIC PANEL WITH GFR today and then every 5 months.  Her last eye examination was October 16, 2020.  Chronic left shoulder pain-she has chronic tendinopathy.  History of repair of right rotator cuff-chronic discomfort.  Primary osteoarthritis of both hands-she has severe osteoarthritis in her hands.  She has incomplete fist formation and decreased grip strength.  Joint protection muscle strengthening was discussed.  Primary osteoarthritis of both feet-she denies any discomfort.  Gait instability-she feels unstable in her gait.  Some of the exercises were demonstrated in the office.  I offered physical therapy which she declined.  DDD (degenerative disc disease), cervical-she had limited range of  motion which some discomfort.  DDD (degenerative disc disease), lumbar-she has off-and-on discomfort in her lower back.  Age-related osteoporosis without current pathological fracture - her last bone density was on October 10, 2018.  Left femoral T score was -2.3, BMD 0.591.  She has been on Prolia by Dr. Forde Dandy.   Chronic kidney disease, unspecified CKD stage - she is followed by nephrology.  Other headache syndrome  Night terrors, adult - followed by Dr. Brett Fairy.  History of gastroesophageal reflux (GERD)  History of hypertension - followed by Dr. Einar Gip.  Blood pressure was normal today.  Anxiety and depression-her major concern today was depression and anxiety.  She misses her family.  She feels lonely in  the independent living center.  She regrets the decision of moving out of her home.  I advised counseling.  I also advised her to discuss this further with her family members.  Orders: Orders Placed This Encounter  Procedures   CBC with Differential/Platelet   COMPLETE METABOLIC PANEL WITH GFR    No orders of the defined types were placed in this encounter.    Follow-Up Instructions: Return in about 5 months (around 06/28/2021) for Rheumatoid arthritis.   Bo Merino, MD  Note - This record has been created using Editor, commissioning.  Chart creation errors have been sought, but may not always  have been located. Such creation errors do not reflect on  the standard of medical care.

## 2021-01-28 ENCOUNTER — Ambulatory Visit: Payer: Medicare HMO | Admitting: Rheumatology

## 2021-01-28 ENCOUNTER — Other Ambulatory Visit: Payer: Self-pay

## 2021-01-28 ENCOUNTER — Encounter: Payer: Self-pay | Admitting: Rheumatology

## 2021-01-28 VITALS — BP 117/69 | HR 83 | Ht 64.0 in | Wt 119.0 lb

## 2021-01-28 DIAGNOSIS — M810 Age-related osteoporosis without current pathological fracture: Secondary | ICD-10-CM

## 2021-01-28 DIAGNOSIS — F514 Sleep terrors [night terrors]: Secondary | ICD-10-CM

## 2021-01-28 DIAGNOSIS — M19072 Primary osteoarthritis, left ankle and foot: Secondary | ICD-10-CM

## 2021-01-28 DIAGNOSIS — M19041 Primary osteoarthritis, right hand: Secondary | ICD-10-CM

## 2021-01-28 DIAGNOSIS — Z8679 Personal history of other diseases of the circulatory system: Secondary | ICD-10-CM

## 2021-01-28 DIAGNOSIS — M19071 Primary osteoarthritis, right ankle and foot: Secondary | ICD-10-CM

## 2021-01-28 DIAGNOSIS — Z79899 Other long term (current) drug therapy: Secondary | ICD-10-CM | POA: Diagnosis not present

## 2021-01-28 DIAGNOSIS — M25512 Pain in left shoulder: Secondary | ICD-10-CM | POA: Diagnosis not present

## 2021-01-28 DIAGNOSIS — M5136 Other intervertebral disc degeneration, lumbar region: Secondary | ICD-10-CM | POA: Diagnosis not present

## 2021-01-28 DIAGNOSIS — F32A Depression, unspecified: Secondary | ICD-10-CM

## 2021-01-28 DIAGNOSIS — N189 Chronic kidney disease, unspecified: Secondary | ICD-10-CM | POA: Diagnosis not present

## 2021-01-28 DIAGNOSIS — M19042 Primary osteoarthritis, left hand: Secondary | ICD-10-CM

## 2021-01-28 DIAGNOSIS — M353 Polymyalgia rheumatica: Secondary | ICD-10-CM | POA: Diagnosis not present

## 2021-01-28 DIAGNOSIS — G8929 Other chronic pain: Secondary | ICD-10-CM

## 2021-01-28 DIAGNOSIS — Z9889 Other specified postprocedural states: Secondary | ICD-10-CM

## 2021-01-28 DIAGNOSIS — Z8719 Personal history of other diseases of the digestive system: Secondary | ICD-10-CM

## 2021-01-28 DIAGNOSIS — G4489 Other headache syndrome: Secondary | ICD-10-CM

## 2021-01-28 DIAGNOSIS — M5030 Other cervical disc degeneration, unspecified cervical region: Secondary | ICD-10-CM

## 2021-01-28 DIAGNOSIS — F419 Anxiety disorder, unspecified: Secondary | ICD-10-CM

## 2021-01-28 DIAGNOSIS — R2681 Unsteadiness on feet: Secondary | ICD-10-CM

## 2021-01-29 LAB — COMPLETE METABOLIC PANEL WITH GFR
AG Ratio: 1.7 (calc) (ref 1.0–2.5)
ALT: 25 U/L (ref 6–29)
AST: 31 U/L (ref 10–35)
Albumin: 4.3 g/dL (ref 3.6–5.1)
Alkaline phosphatase (APISO): 48 U/L (ref 37–153)
BUN/Creatinine Ratio: 23 (calc) — ABNORMAL HIGH (ref 6–22)
BUN: 30 mg/dL — ABNORMAL HIGH (ref 7–25)
CO2: 29 mmol/L (ref 20–32)
Calcium: 9.9 mg/dL (ref 8.6–10.4)
Chloride: 105 mmol/L (ref 98–110)
Creat: 1.32 mg/dL — ABNORMAL HIGH (ref 0.60–0.95)
Globulin: 2.6 g/dL (calc) (ref 1.9–3.7)
Glucose, Bld: 87 mg/dL (ref 65–99)
Potassium: 4.6 mmol/L (ref 3.5–5.3)
Sodium: 142 mmol/L (ref 135–146)
Total Bilirubin: 1.1 mg/dL (ref 0.2–1.2)
Total Protein: 6.9 g/dL (ref 6.1–8.1)
eGFR: 40 mL/min/{1.73_m2} — ABNORMAL LOW (ref >=60)

## 2021-01-29 LAB — CBC WITH DIFFERENTIAL/PLATELET
Absolute Monocytes: 402 cells/uL (ref 200–950)
Basophils Absolute: 29 cells/uL (ref 0–200)
Basophils Relative: 0.6 %
Eosinophils Absolute: 69 cells/uL (ref 15–500)
Eosinophils Relative: 1.4 %
HCT: 31.5 % — ABNORMAL LOW (ref 35.0–45.0)
Hemoglobin: 10.7 g/dL — ABNORMAL LOW (ref 11.7–15.5)
Lymphs Abs: 941 cells/uL (ref 850–3900)
MCH: 33.4 pg — ABNORMAL HIGH (ref 27.0–33.0)
MCHC: 34 g/dL (ref 32.0–36.0)
MCV: 98.4 fL (ref 80.0–100.0)
MPV: 10.7 fL (ref 7.5–12.5)
Monocytes Relative: 8.2 %
Neutro Abs: 3459 cells/uL (ref 1500–7800)
Neutrophils Relative %: 70.6 %
Platelets: 163 10*3/uL (ref 140–400)
RBC: 3.2 10*6/uL — ABNORMAL LOW (ref 3.80–5.10)
RDW: 12.1 % (ref 11.0–15.0)
Total Lymphocyte: 19.2 %
WBC: 4.9 10*3/uL (ref 3.8–10.8)

## 2021-01-29 NOTE — Progress Notes (Signed)
Anemia noted.  Please forward results to Dr. Forde Dandy.  Patient should take multivitamin with iron.  Creatinine is elevated with low GFR.  Please advise patient to schedule appointment with the nephrologist.  Please forward results to her nephrologist.

## 2021-02-02 ENCOUNTER — Telehealth: Payer: Self-pay | Admitting: *Deleted

## 2021-02-02 NOTE — Telephone Encounter (Signed)
Patient contacted the office stating she got her Prolia injection scheduled for 02/13/2021. She wanted to make sure this was okay. She states she was unsure of when she had her last one.

## 2021-02-02 NOTE — Telephone Encounter (Addendum)
Called Dr. Baldwin Crown office to determine if they are still managing patient's Prolia. They are still managing her Prolia per [place]. Notified Dr. Baldwin Crown office that patient is scheduled for 02/13/21 @ 2pm. Returned call to patient to notify  Knox Saliva, PharmD, MPH, BCPS Clinical Pharmacist (Rheumatology and Pulmonology)

## 2021-02-03 DIAGNOSIS — R41841 Cognitive communication deficit: Secondary | ICD-10-CM | POA: Diagnosis not present

## 2021-02-03 DIAGNOSIS — R488 Other symbolic dysfunctions: Secondary | ICD-10-CM | POA: Diagnosis not present

## 2021-02-03 DIAGNOSIS — R490 Dysphonia: Secondary | ICD-10-CM | POA: Diagnosis not present

## 2021-02-04 DIAGNOSIS — I071 Rheumatic tricuspid insufficiency: Secondary | ICD-10-CM | POA: Diagnosis not present

## 2021-02-04 DIAGNOSIS — R488 Other symbolic dysfunctions: Secondary | ICD-10-CM | POA: Diagnosis not present

## 2021-02-04 DIAGNOSIS — E785 Hyperlipidemia, unspecified: Secondary | ICD-10-CM | POA: Diagnosis not present

## 2021-02-04 DIAGNOSIS — I700 Atherosclerosis of aorta: Secondary | ICD-10-CM | POA: Diagnosis not present

## 2021-02-04 DIAGNOSIS — R69 Illness, unspecified: Secondary | ICD-10-CM | POA: Diagnosis not present

## 2021-02-04 DIAGNOSIS — I129 Hypertensive chronic kidney disease with stage 1 through stage 4 chronic kidney disease, or unspecified chronic kidney disease: Secondary | ICD-10-CM | POA: Diagnosis not present

## 2021-02-04 DIAGNOSIS — E039 Hypothyroidism, unspecified: Secondary | ICD-10-CM | POA: Diagnosis not present

## 2021-02-04 DIAGNOSIS — R7302 Impaired glucose tolerance (oral): Secondary | ICD-10-CM | POA: Diagnosis not present

## 2021-02-04 DIAGNOSIS — M353 Polymyalgia rheumatica: Secondary | ICD-10-CM | POA: Diagnosis not present

## 2021-02-04 DIAGNOSIS — R41841 Cognitive communication deficit: Secondary | ICD-10-CM | POA: Diagnosis not present

## 2021-02-04 DIAGNOSIS — N1832 Chronic kidney disease, stage 3b: Secondary | ICD-10-CM | POA: Diagnosis not present

## 2021-02-04 DIAGNOSIS — R490 Dysphonia: Secondary | ICD-10-CM | POA: Diagnosis not present

## 2021-02-04 DIAGNOSIS — I340 Nonrheumatic mitral (valve) insufficiency: Secondary | ICD-10-CM | POA: Diagnosis not present

## 2021-02-05 ENCOUNTER — Ambulatory Visit: Payer: Medicare HMO | Admitting: Neurology

## 2021-02-05 ENCOUNTER — Telehealth: Payer: Self-pay | Admitting: Neurology

## 2021-02-05 DIAGNOSIS — R41841 Cognitive communication deficit: Secondary | ICD-10-CM | POA: Diagnosis not present

## 2021-02-05 DIAGNOSIS — R488 Other symbolic dysfunctions: Secondary | ICD-10-CM | POA: Diagnosis not present

## 2021-02-05 DIAGNOSIS — R490 Dysphonia: Secondary | ICD-10-CM | POA: Diagnosis not present

## 2021-02-05 NOTE — Telephone Encounter (Signed)
Can you please call and get her rescheduled? Thank you

## 2021-02-05 NOTE — Telephone Encounter (Signed)
Patient 's facility had a fire alarm this afternoon and she was not allowed to use the elevator - couldn't leave the facility. Please reschedule , she was truly sorry!

## 2021-02-09 DIAGNOSIS — R490 Dysphonia: Secondary | ICD-10-CM | POA: Diagnosis not present

## 2021-02-09 DIAGNOSIS — R488 Other symbolic dysfunctions: Secondary | ICD-10-CM | POA: Diagnosis not present

## 2021-02-09 DIAGNOSIS — R41841 Cognitive communication deficit: Secondary | ICD-10-CM | POA: Diagnosis not present

## 2021-02-12 ENCOUNTER — Other Ambulatory Visit (HOSPITAL_COMMUNITY): Payer: Self-pay | Admitting: *Deleted

## 2021-02-12 DIAGNOSIS — R41841 Cognitive communication deficit: Secondary | ICD-10-CM | POA: Diagnosis not present

## 2021-02-12 DIAGNOSIS — R490 Dysphonia: Secondary | ICD-10-CM | POA: Diagnosis not present

## 2021-02-12 DIAGNOSIS — R488 Other symbolic dysfunctions: Secondary | ICD-10-CM | POA: Diagnosis not present

## 2021-02-13 ENCOUNTER — Ambulatory Visit (HOSPITAL_COMMUNITY)
Admission: RE | Admit: 2021-02-13 | Discharge: 2021-02-13 | Disposition: A | Discharge status: Home / Self Care | Payer: Medicare HMO | Source: Ambulatory Visit | Attending: Endocrinology | Admitting: Endocrinology

## 2021-02-13 ENCOUNTER — Other Ambulatory Visit: Payer: Self-pay

## 2021-02-13 ENCOUNTER — Other Ambulatory Visit (HOSPITAL_COMMUNITY): Payer: Self-pay

## 2021-02-13 DIAGNOSIS — M810 Age-related osteoporosis without current pathological fracture: Secondary | ICD-10-CM | POA: Insufficient documentation

## 2021-02-13 MED ORDER — DENOSUMAB 60 MG/ML ~~LOC~~ SOSY
60.0000 mg | PREFILLED_SYRINGE | Freq: Once | SUBCUTANEOUS | Status: AC
  Administered 2021-02-13: 60 mg via SUBCUTANEOUS

## 2021-02-13 MED ORDER — DENOSUMAB 60 MG/ML ~~LOC~~ SOSY
PREFILLED_SYRINGE | SUBCUTANEOUS | Status: AC
  Filled 2021-02-13: qty 1

## 2021-02-16 DIAGNOSIS — R41841 Cognitive communication deficit: Secondary | ICD-10-CM | POA: Diagnosis not present

## 2021-02-16 DIAGNOSIS — R490 Dysphonia: Secondary | ICD-10-CM | POA: Diagnosis not present

## 2021-02-16 DIAGNOSIS — R488 Other symbolic dysfunctions: Secondary | ICD-10-CM | POA: Diagnosis not present

## 2021-02-18 DIAGNOSIS — R490 Dysphonia: Secondary | ICD-10-CM | POA: Diagnosis not present

## 2021-02-18 DIAGNOSIS — R488 Other symbolic dysfunctions: Secondary | ICD-10-CM | POA: Diagnosis not present

## 2021-02-18 DIAGNOSIS — R41841 Cognitive communication deficit: Secondary | ICD-10-CM | POA: Diagnosis not present

## 2021-02-24 DIAGNOSIS — R41841 Cognitive communication deficit: Secondary | ICD-10-CM | POA: Diagnosis not present

## 2021-02-24 DIAGNOSIS — R490 Dysphonia: Secondary | ICD-10-CM | POA: Diagnosis not present

## 2021-02-24 DIAGNOSIS — R488 Other symbolic dysfunctions: Secondary | ICD-10-CM | POA: Diagnosis not present

## 2021-02-25 DIAGNOSIS — R490 Dysphonia: Secondary | ICD-10-CM | POA: Diagnosis not present

## 2021-02-25 DIAGNOSIS — R41841 Cognitive communication deficit: Secondary | ICD-10-CM | POA: Diagnosis not present

## 2021-02-25 DIAGNOSIS — R488 Other symbolic dysfunctions: Secondary | ICD-10-CM | POA: Diagnosis not present

## 2021-02-26 DIAGNOSIS — R41841 Cognitive communication deficit: Secondary | ICD-10-CM | POA: Diagnosis not present

## 2021-02-26 DIAGNOSIS — R490 Dysphonia: Secondary | ICD-10-CM | POA: Diagnosis not present

## 2021-03-04 DIAGNOSIS — Z1231 Encounter for screening mammogram for malignant neoplasm of breast: Secondary | ICD-10-CM | POA: Diagnosis not present

## 2021-03-05 DIAGNOSIS — R490 Dysphonia: Secondary | ICD-10-CM | POA: Diagnosis not present

## 2021-03-05 DIAGNOSIS — R41841 Cognitive communication deficit: Secondary | ICD-10-CM | POA: Diagnosis not present

## 2021-03-05 DIAGNOSIS — R488 Other symbolic dysfunctions: Secondary | ICD-10-CM | POA: Diagnosis not present

## 2021-03-10 DIAGNOSIS — D649 Anemia, unspecified: Secondary | ICD-10-CM | POA: Diagnosis not present

## 2021-03-10 DIAGNOSIS — E785 Hyperlipidemia, unspecified: Secondary | ICD-10-CM | POA: Diagnosis not present

## 2021-03-10 DIAGNOSIS — R490 Dysphonia: Secondary | ICD-10-CM | POA: Diagnosis not present

## 2021-03-10 DIAGNOSIS — N183 Chronic kidney disease, stage 3 unspecified: Secondary | ICD-10-CM | POA: Diagnosis not present

## 2021-03-10 DIAGNOSIS — R488 Other symbolic dysfunctions: Secondary | ICD-10-CM | POA: Diagnosis not present

## 2021-03-10 DIAGNOSIS — R41841 Cognitive communication deficit: Secondary | ICD-10-CM | POA: Diagnosis not present

## 2021-03-11 DIAGNOSIS — R488 Other symbolic dysfunctions: Secondary | ICD-10-CM | POA: Diagnosis not present

## 2021-03-11 DIAGNOSIS — R490 Dysphonia: Secondary | ICD-10-CM | POA: Diagnosis not present

## 2021-03-11 DIAGNOSIS — R41841 Cognitive communication deficit: Secondary | ICD-10-CM | POA: Diagnosis not present

## 2021-03-17 DIAGNOSIS — R488 Other symbolic dysfunctions: Secondary | ICD-10-CM | POA: Diagnosis not present

## 2021-03-17 DIAGNOSIS — R41841 Cognitive communication deficit: Secondary | ICD-10-CM | POA: Diagnosis not present

## 2021-03-17 DIAGNOSIS — R490 Dysphonia: Secondary | ICD-10-CM | POA: Diagnosis not present

## 2021-03-18 DIAGNOSIS — R41841 Cognitive communication deficit: Secondary | ICD-10-CM | POA: Diagnosis not present

## 2021-03-18 DIAGNOSIS — R488 Other symbolic dysfunctions: Secondary | ICD-10-CM | POA: Diagnosis not present

## 2021-03-18 DIAGNOSIS — R490 Dysphonia: Secondary | ICD-10-CM | POA: Diagnosis not present

## 2021-03-19 DIAGNOSIS — R488 Other symbolic dysfunctions: Secondary | ICD-10-CM | POA: Diagnosis not present

## 2021-03-19 DIAGNOSIS — I129 Hypertensive chronic kidney disease with stage 1 through stage 4 chronic kidney disease, or unspecified chronic kidney disease: Secondary | ICD-10-CM | POA: Diagnosis not present

## 2021-03-19 DIAGNOSIS — N1832 Chronic kidney disease, stage 3b: Secondary | ICD-10-CM | POA: Diagnosis not present

## 2021-03-19 DIAGNOSIS — R490 Dysphonia: Secondary | ICD-10-CM | POA: Diagnosis not present

## 2021-03-19 DIAGNOSIS — K219 Gastro-esophageal reflux disease without esophagitis: Secondary | ICD-10-CM | POA: Diagnosis not present

## 2021-03-19 DIAGNOSIS — R41841 Cognitive communication deficit: Secondary | ICD-10-CM | POA: Diagnosis not present

## 2021-03-19 DIAGNOSIS — M353 Polymyalgia rheumatica: Secondary | ICD-10-CM | POA: Diagnosis not present

## 2021-03-23 ENCOUNTER — Other Ambulatory Visit: Payer: Self-pay | Admitting: Nephrology

## 2021-03-23 DIAGNOSIS — N1832 Chronic kidney disease, stage 3b: Secondary | ICD-10-CM

## 2021-03-25 DIAGNOSIS — R41841 Cognitive communication deficit: Secondary | ICD-10-CM | POA: Diagnosis not present

## 2021-03-25 DIAGNOSIS — R488 Other symbolic dysfunctions: Secondary | ICD-10-CM | POA: Diagnosis not present

## 2021-03-25 DIAGNOSIS — R490 Dysphonia: Secondary | ICD-10-CM | POA: Diagnosis not present

## 2021-03-26 DIAGNOSIS — R490 Dysphonia: Secondary | ICD-10-CM | POA: Diagnosis not present

## 2021-03-26 DIAGNOSIS — R41841 Cognitive communication deficit: Secondary | ICD-10-CM | POA: Diagnosis not present

## 2021-03-26 DIAGNOSIS — R488 Other symbolic dysfunctions: Secondary | ICD-10-CM | POA: Diagnosis not present

## 2021-03-27 ENCOUNTER — Other Ambulatory Visit: Payer: Medicare HMO

## 2021-03-27 ENCOUNTER — Ambulatory Visit
Admission: RE | Admit: 2021-03-27 | Discharge: 2021-03-27 | Disposition: A | Discharge status: Home / Self Care | Payer: Medicare HMO | Source: Ambulatory Visit | Attending: Nephrology | Admitting: Nephrology

## 2021-03-27 DIAGNOSIS — N1832 Chronic kidney disease, stage 3b: Secondary | ICD-10-CM

## 2021-03-27 DIAGNOSIS — N281 Cyst of kidney, acquired: Secondary | ICD-10-CM | POA: Diagnosis not present

## 2021-03-27 DIAGNOSIS — N189 Chronic kidney disease, unspecified: Secondary | ICD-10-CM | POA: Diagnosis not present

## 2021-03-28 ENCOUNTER — Other Ambulatory Visit: Payer: Self-pay | Admitting: Neurology

## 2021-03-28 ENCOUNTER — Other Ambulatory Visit: Payer: Self-pay | Admitting: Physician Assistant

## 2021-03-28 DIAGNOSIS — F514 Sleep terrors [night terrors]: Secondary | ICD-10-CM

## 2021-03-30 NOTE — Telephone Encounter (Signed)
Next Visit: 06/30/2021  Last Visit: 01/28/2021  Labs: 01/28/2021 Anemia noted.  Creatinine is elevated with low GFR.    Eye exam:  10/16/2020 normal.   Current Dose per office note 01/28/2021: Plaquenil 200 mg p.o. daily  DX:PMR (polymyalgia rheumatica)  Last Fill: 01/07/2021  Okay to refill Plaquenil?

## 2021-03-30 NOTE — Telephone Encounter (Signed)
Received refill request for clonazepam.  Last OV was on 08/05/20.  Next OV is scheduled for 05/13/21 .  Last RX was written on 10/30/20 for 90 tabs.   [place] [facility] Database has been reviewed.   Please fill as work in [employer].

## 2021-03-31 DIAGNOSIS — R41841 Cognitive communication deficit: Secondary | ICD-10-CM | POA: Diagnosis not present

## 2021-03-31 DIAGNOSIS — R490 Dysphonia: Secondary | ICD-10-CM | POA: Diagnosis not present

## 2021-03-31 DIAGNOSIS — R488 Other symbolic dysfunctions: Secondary | ICD-10-CM | POA: Diagnosis not present

## 2021-04-01 DIAGNOSIS — R488 Other symbolic dysfunctions: Secondary | ICD-10-CM | POA: Diagnosis not present

## 2021-04-01 DIAGNOSIS — R490 Dysphonia: Secondary | ICD-10-CM | POA: Diagnosis not present

## 2021-04-01 DIAGNOSIS — R41841 Cognitive communication deficit: Secondary | ICD-10-CM | POA: Diagnosis not present

## 2021-04-02 ENCOUNTER — Other Ambulatory Visit: Payer: Self-pay | Admitting: Neurology

## 2021-04-02 DIAGNOSIS — F514 Sleep terrors [night terrors]: Secondary | ICD-10-CM

## 2021-04-02 MED ORDER — CLONAZEPAM 0.5 MG PO TABS: 0.5000 mg | ORAL_TABLET | Freq: Every day | ORAL | 0 refills | Status: DC

## 2021-04-02 NOTE — Telephone Encounter (Signed)
Pt is needing a refill request for her clonazePAM (KLONOPIN) 0.5 MG tablet sent to the [facility] for a 90 day supply

## 2021-04-03 DIAGNOSIS — R490 Dysphonia: Secondary | ICD-10-CM | POA: Diagnosis not present

## 2021-04-03 DIAGNOSIS — R41841 Cognitive communication deficit: Secondary | ICD-10-CM | POA: Diagnosis not present

## 2021-04-03 DIAGNOSIS — R488 Other symbolic dysfunctions: Secondary | ICD-10-CM | POA: Diagnosis not present

## 2021-04-06 ENCOUNTER — Encounter: Payer: Self-pay | Admitting: Cardiology

## 2021-04-06 ENCOUNTER — Ambulatory Visit: Payer: Medicare HMO | Admitting: Cardiology

## 2021-04-06 ENCOUNTER — Other Ambulatory Visit: Payer: Self-pay

## 2021-04-06 VITALS — BP 143/76 | HR 84 | Temp 98.6°F | Resp 17 | Ht 64.0 in | Wt 116.2 lb

## 2021-04-06 DIAGNOSIS — I10 Essential (primary) hypertension: Secondary | ICD-10-CM | POA: Diagnosis not present

## 2021-04-06 DIAGNOSIS — N1831 Chronic kidney disease, stage 3a: Secondary | ICD-10-CM | POA: Diagnosis not present

## 2021-04-06 DIAGNOSIS — I951 Orthostatic hypotension: Secondary | ICD-10-CM | POA: Diagnosis not present

## 2021-04-06 NOTE — Progress Notes (Signed)
Primary Physician/Referring:  [name], MD  Patient ID: Gloria Anderson, female    DOB: 1933-10-06, 88 y.o.   MRN: [id number]  Chief Complaint  Patient presents with   Supine Hypertension   Orthostatic hypotension   Follow-up    6 months   HPI:    Gloria Anderson  is a 88 y.o. 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  She is extremely sensitive to blood pressure variations.  She is also enrolled in   in Remote Patient Monitoring and Principal Care Management as patient is high risk for hospitalization and complications from underlying medical conditions.   This is a 65-month office visit.  Except for occasional episodes of dizziness and palpitations, she has no specific complaints although she has written concerns that she brings with her today.  Continues to remain active.  She has not had any syncope or near syncope.  She does sleep on a wedge at night due to orthostatic hypertension.   Past Medical History:  Diagnosis Date   Arthritis    Complication of anesthesia    Constipation    Depression    GERD (gastroesophageal reflux disease)    Hypertension    Hypothyroidism    Osteoporosis    Pneumonia 2018   Polymyalgia ([place])    Polymyalgia rheumatica (HCC)    PONV (postoperative nausea and vomiting)    Past Surgical History:  Procedure Laterality Date   BASAL CELL CARCINOMA EXCISION  2022   on face   COLONOSCOPY W/ POLYPECTOMY     EYE SURGERY     both cataracts   LUMBAR LAMINECTOMY/DECOMPRESSION MICRODISCECTOMY N/A 08/15/2018   Procedure: Lumbar three to Lumbar five Decompressive lumbar laminectomy;  Surgeon: Erline Levine, MD;  Location: [facility];  Service: Neurosurgery;  Laterality: N/A;   NASAL SINUS SURGERY     SHOULDER SURGERY Right 2007   repair   TONSILLECTOMY     TRIGGER FINGER RELEASE  03/19/2011   Procedure:  left  middle finger RELEASE TRIGGER FINGER/A-1 PULLEY;  Surgeon: Cammie Sickle., MD;  Location: [facility];  Service: Orthopedics;  Laterality: Right;  Procedure:  Release Right Long and Ring Trigger Fingers, Release Left Long Trigger Finger, Injection Left Long Proximal Phalangeal Joint   TRIGGER FINGER RELEASE Right    Ring finger, middle finger   UPPER GASTROINTESTINAL ENDOSCOPY     Social History   Tobacco Use   Smoking status: Never   Smokeless tobacco: Never  Substance Use Topics   Alcohol use: No    Alcohol/week: 0.0 standard drinks   ROS  Review of Systems  Cardiovascular:  Positive for palpitations. Negative for chest pain, dyspnea on exertion and leg swelling.  Gastrointestinal:  Negative for melena.  Neurological:  Positive for dizziness.  Psychiatric/Behavioral:  The patient is nervous/anxious (about health).   Objective  Blood pressure (!) 143/76, pulse 84, temperature 98.6 F (37 C), temperature source Temporal, resp. rate 17, height 5\' 4"  (1.626 m), weight 116 lb 3.2 oz (52.7 kg), SpO2 99 %.  Vitals with BMI 04/06/2021 02/13/2021 01/28/2021  Height 5\' 4"  - -  Weight 116 lbs 3 oz - -  BMI 46.96 - -  Systolic 295 284 132  Diastolic 76 70 69  Pulse 84 84 83    Orthostatic VS for the past 72 hrs (Last 3 readings):  Orthostatic BP Patient Position BP Location Cuff Size Orthostatic Pulse  04/06/21 1426 123/77 Standing Left Arm Normal 93  04/06/21 1425 142/76 Sitting Left Arm Normal 86  04/06/21 1424 155/78 Supine Left Arm Normal 85    Physical Exam Vitals reviewed.  Constitutional:      Appearance: Normal appearance. She is well-developed.  Cardiovascular:     Rate and Rhythm: Normal rate and regular rhythm.     Pulses: Intact distal pulses.          Carotid pulses are  on the right side with bruit and  on the left side with bruit.      Femoral pulses are  on the right side with bruit and  on the left side with bruit.    Heart sounds: Murmur heard.   Crescendo-decrescendo mid to late systolic murmur is present with a grade of 2/6 at the apex.  Pulmonary:     Effort: Pulmonary effort is normal. No accessory muscle usage or respiratory distress.     Breath sounds: Normal breath sounds.  Abdominal:     General: Abdomen is flat. Bowel sounds are normal. There is abdominal bruit.     Palpations: Abdomen is soft.  Neurological:     General: No focal deficit present.     Mental Status: She is alert and oriented to person, place, and time.   Laboratory examination:   Recent Labs    08/20/20 1544 01/28/21 1504  NA 138 142  K 4.7 4.6  CL 100 105  CO2 28 29  GLUCOSE 128* 87  BUN 22 30*  CREATININE 1.21* 1.32*  CALCIUM 9.6 9.9  GFRNONAA 41*  --   GFRAA 48*  --    CrCl cannot be calculated (Patient's most recent lab result is older than the maximum 21 days allowed.).  CMP Latest Ref Rng & Units 01/28/2021 08/20/2020 03/21/2020  Glucose 65 - 99 mg/dL 87 128(H) 94  BUN 7 - 25 mg/dL 30(H) 22 27(H)  Creatinine 0.60 - 0.95 mg/dL 1.32(H) 1.21(H) 1.09(H)  Sodium 135 - 146 mmol/L 142 138 139  Potassium 3.5 - 5.3 mmol/L 4.6 4.7 4.5  Chloride 98 - 110 mmol/L 105 100 103  CO2 20 - 32 mmol/L 29 28 29   Calcium 8.6 - 10.4 mg/dL 9.9 9.6 9.7  Total Protein 6.1 - 8.1 g/dL 6.9 6.8 6.8  Total Bilirubin 0.2 - 1.2 mg/dL 1.1 1.0 1.2  AST 10 - 35 U/L 31 29 25   ALT 6 - 29 U/L 25 21 18    CBC Latest Ref Rng & Units 01/28/2021 08/20/2020 03/21/2020  WBC 3.8 - 10.8 Thousand/uL 4.9 5.2 4.9  Hemoglobin 11.7 - 15.5 g/dL 10.7(L) 11.1(L) 11.4(L)  Hematocrit 35.0 - 45.0 % 31.5(L) 33.2(L) 33.5(L)  Platelets 140 - 400 Thousand/uL 163 184 186   External labs: Cholesterol, total 162.000 m 05/26/2020 HDL 79.000 mg 05/26/2020 LDL 74.000 mg 05/26/2020 Triglycerides 43.000 mg 05/26/2020  A1C 4.900 % 05/26/2020  Medications and allergies  No Known Allergies   Current Outpatient Medications on File Prior to Visit  Medication Sig Dispense Refill   acetaminophen  (TYLENOL) 325 MG tablet Take 650 mg by mouth every 6 (six) hours as needed.     Apoaequorin (PREVAGEN) 10 MG CAPS Take 10 mg by mouth daily after breakfast. 30 capsule 0   atorvastatin (LIPITOR) 10 MG tablet Take 10 mg by mouth daily.      Calcium Citrate (CITRACAL PO) Take 1,300 mg by mouth daily. Taking it in the AM     carvedilol (COREG) 3.125 MG tablet TAKE 1  TABLET(3.125 MG) BY MOUTH FOUR TIMES DAILY (Patient taking differently: Take 3.125 mg by mouth as needed. TAKE 1 TABLET(3.125 MG) BY MOUTH FOUR TIMES DAILY) 360 tablet 3   clonazePAM (KLONOPIN) 0.5 MG tablet Take 1 tablet (0.5 mg total) by mouth at bedtime. 90 tablet 0   denosumab (PROLIA) 60 MG/ML SOSY injection Inject 60 mg into the skin every 6 (six) months.     fish oil-omega-3 fatty acids 1000 MG capsule Take 1 g by mouth daily.      Garlic 4327 MG TBEC Take 1,000 mg by mouth daily. [place]     hydrALAZINE (APRESOLINE) 25 MG tablet TAKE 1 TABLET BY MOUTH THREE TIMES DAILY AS NEEDED FOR SYSTOLIC BLOOD PRESSURE GREATER THAN 150 270 tablet 3   hydroxychloroquine (PLAQUENIL) 200 MG tablet Take 1 tablet (200 mg total) by mouth daily. 90 tablet 0   levothyroxine (SYNTHROID, LEVOTHROID) 50 MCG tablet Take 50 mcg by mouth daily before breakfast.      Lifitegrast (XIIDRA OP) Apply 1 drop to eye 2 (two) times daily.     Multiple Vitamin (MULTIVITAMIN PO) Take by mouth daily.     omeprazole (PRILOSEC) 20 MG capsule Take 20 mg by mouth daily.     polyethylene glycol (MIRALAX / GLYCOLAX) packet Take 17 g by mouth daily as needed for moderate constipation.      Probiotic Product (PROBIOTIC DAILY PO) Take 1 tablet by mouth daily. Culturelle     Propylene Glycol (SYSTANE BALANCE OP) Apply 1 drop to eye as needed.     No current facility-administered medications on file prior to visit.     Radiology:  No results found.  Cardiac Studies:   Exercise Treadmill Stress Test 10/07/2017:  Indication: chest pain The patient exercised on Bruce  protocol for  06:19 min. Patient achieved  7.40 METS and reached HR  114 bpm, which is  82 % of maximum age-predicted HR.  Stress test terminated due to fatigue.    Exercise capacity was fair for age. HR Response to Exercise: Appropriate. BP Response to Exercise: Normal resting BP- appropriate response. Chest Pain: none. Arrhythmias: Occasional PVC s. Resting EKG demonstrates Normal sinus rhythm. ST Changes: With peak exercise there was no ST-T changes of ischemia.   Overall Impression:  Submaximal stress test with no ischemic changes. Continue primary/secondary prevention.   Echocardiogram 12/13/2018: Normal LV systolic function with EF 67%. Left ventricle  cavity is normal in size. Normal left ventricular wall  thickness. Normal global wall motion. Diastolic function  could not be assessed due to severity of mitral  regurgitation.  Moderate (Grade II) mitral regurgitation. Moderate tricuspid regurgitation. Moderate pulmonary hypertension. Estimated pulmonary artery systolic pressure is  50 mmHg.  Small circumferential pericardial effusion wth no hemodynamic  compromise.  IVC is dilated with a respiratory response of <50%. Estimated  RA pressure 10-15 mmHg. No significant change compared to previous study on  05/09/2018.  Carotid artery duplex  07/04/2019: No hemodynamically significant arterial disease in the internal carotid artery bilaterally. Antegrade right vertebral artery flow. Antegrade left vertebral artery flow.  EKG:    EKG 04/06/2021: Normal sinus rhythm at rate of 83 bpm, left atrial enlargement, otherwise normal EKG.  No significant change from 09/25/2020.  Assessment     ICD-10-CM   1. Essential hypertension  I10 EKG 12-Lead    2. Orthostatic hypotension  I95.1     3. Stage 3a chronic kidney disease (HCC)  N18.31       No orders of  the defined types were placed in this encounter.    Medications Discontinued During This Encounter  Medication Reason    Iron-Vitamins (GERITOL PO)      Recommendations:   Gloria Anderson  is a 88 y.o. 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  She is extremely sensitive to blood pressure variations.  She presents for 13-month office visit, has questions written down including palpitations, multiple episodes of low and high blood pressure but no syncope.  Overall she has done very well in managing her blood pressure issues.  She has been taking carvedilol only if the systolic blood pressure is >130 mmHg.  She has not had any further significant dizziness or syncope.  States that when the blood pressure is low she is drinking some mineral water or electrolytes with rapid improvement in symptoms and also blood pressure.  She is wearing support stockings on a regular basis.  I reviewed the results of the ultrasound of the abdomen/kidneys that was performed by her PCP, reassured her.  She has medical renal disease and mild stage IIIa chronic kidney disease, labs reviewed, stable.  No changes in the medications were done today, I will see her back in 6 months.  With regard to moderate MR and moderate TR and moderate pulm hypertension, with no clinical evidence of heart failure, no change in physical exam, I do not want to order any other testing.  Adrian Prows, MD, [facility] 04/06/2021, 9:52 PM Office: [phone] Pager: [phone]

## 2021-04-07 ENCOUNTER — Telehealth: Payer: Self-pay | Admitting: Neurology

## 2021-04-07 DIAGNOSIS — R41841 Cognitive communication deficit: Secondary | ICD-10-CM | POA: Diagnosis not present

## 2021-04-07 DIAGNOSIS — R488 Other symbolic dysfunctions: Secondary | ICD-10-CM | POA: Diagnosis not present

## 2021-04-07 DIAGNOSIS — F514 Sleep terrors [night terrors]: Secondary | ICD-10-CM

## 2021-04-07 DIAGNOSIS — R490 Dysphonia: Secondary | ICD-10-CM | POA: Diagnosis not present

## 2021-04-07 MED ORDER — CLONAZEPAM 0.5 MG PO TABS: 0.5000 mg | ORAL_TABLET | Freq: Every day | ORAL | 0 refills | Status: DC

## 2021-04-07 NOTE — Telephone Encounter (Addendum)
Called back and spoke w/ Gerald Stabs. Out of stock of both 0.5mg  and 1mg  strength.   Called local [facility] pharmacy at [phone]. Pharmacy closed for lunch until 2pm. Will try again later.  I called back. Spoke w/ Shirlean Mylar. Confirmed they have clonazepam 0.5mg  on file. Aware we will e-scribe rx. I called pt and LVM informing her of change and why. Asked her to call back if she has any further questions.

## 2021-04-07 NOTE — Telephone Encounter (Signed)
Eceleyn @ [facility] has called to report that clonazePAM (KLONOPIN) 0.5 MG tablet on b/o and wants to know if there will be a substitute called in.  Please call [phone] option 2 ITV#[phone]

## 2021-04-07 NOTE — Telephone Encounter (Signed)
Meds ordered this encounter  Medications   clonazePAM (KLONOPIN) 0.5 MG tablet    Sig: Take 1 tablet (0.5 mg total) by mouth at bedtime.    Dispense:  90 tablet    Refill:  0

## 2021-04-08 DIAGNOSIS — R490 Dysphonia: Secondary | ICD-10-CM | POA: Diagnosis not present

## 2021-04-08 DIAGNOSIS — R41841 Cognitive communication deficit: Secondary | ICD-10-CM | POA: Diagnosis not present

## 2021-04-08 DIAGNOSIS — R488 Other symbolic dysfunctions: Secondary | ICD-10-CM | POA: Diagnosis not present

## 2021-04-09 DIAGNOSIS — R41841 Cognitive communication deficit: Secondary | ICD-10-CM | POA: Diagnosis not present

## 2021-04-09 DIAGNOSIS — R488 Other symbolic dysfunctions: Secondary | ICD-10-CM | POA: Diagnosis not present

## 2021-04-09 DIAGNOSIS — R490 Dysphonia: Secondary | ICD-10-CM | POA: Diagnosis not present

## 2021-04-15 DIAGNOSIS — R202 Paresthesia of skin: Secondary | ICD-10-CM | POA: Diagnosis not present

## 2021-04-15 DIAGNOSIS — L814 Other melanin hyperpigmentation: Secondary | ICD-10-CM | POA: Diagnosis not present

## 2021-04-15 DIAGNOSIS — Z85828 Personal history of other malignant neoplasm of skin: Secondary | ICD-10-CM | POA: Diagnosis not present

## 2021-04-15 DIAGNOSIS — L821 Other seborrheic keratosis: Secondary | ICD-10-CM | POA: Diagnosis not present

## 2021-04-15 DIAGNOSIS — Z08 Encounter for follow-up examination after completed treatment for malignant neoplasm: Secondary | ICD-10-CM | POA: Diagnosis not present

## 2021-04-15 DIAGNOSIS — D225 Melanocytic nevi of trunk: Secondary | ICD-10-CM | POA: Diagnosis not present

## 2021-04-16 DIAGNOSIS — R488 Other symbolic dysfunctions: Secondary | ICD-10-CM | POA: Diagnosis not present

## 2021-04-16 DIAGNOSIS — R41841 Cognitive communication deficit: Secondary | ICD-10-CM | POA: Diagnosis not present

## 2021-04-16 DIAGNOSIS — R490 Dysphonia: Secondary | ICD-10-CM | POA: Diagnosis not present

## 2021-04-17 DIAGNOSIS — R41841 Cognitive communication deficit: Secondary | ICD-10-CM | POA: Diagnosis not present

## 2021-04-17 DIAGNOSIS — R488 Other symbolic dysfunctions: Secondary | ICD-10-CM | POA: Diagnosis not present

## 2021-04-17 DIAGNOSIS — R490 Dysphonia: Secondary | ICD-10-CM | POA: Diagnosis not present

## 2021-04-21 DIAGNOSIS — R488 Other symbolic dysfunctions: Secondary | ICD-10-CM | POA: Diagnosis not present

## 2021-04-21 DIAGNOSIS — R41841 Cognitive communication deficit: Secondary | ICD-10-CM | POA: Diagnosis not present

## 2021-04-21 DIAGNOSIS — R490 Dysphonia: Secondary | ICD-10-CM | POA: Diagnosis not present

## 2021-04-22 DIAGNOSIS — R41841 Cognitive communication deficit: Secondary | ICD-10-CM | POA: Diagnosis not present

## 2021-04-22 DIAGNOSIS — R490 Dysphonia: Secondary | ICD-10-CM | POA: Diagnosis not present

## 2021-04-22 DIAGNOSIS — R488 Other symbolic dysfunctions: Secondary | ICD-10-CM | POA: Diagnosis not present

## 2021-04-23 DIAGNOSIS — R488 Other symbolic dysfunctions: Secondary | ICD-10-CM | POA: Diagnosis not present

## 2021-04-23 DIAGNOSIS — R490 Dysphonia: Secondary | ICD-10-CM | POA: Diagnosis not present

## 2021-04-23 DIAGNOSIS — R41841 Cognitive communication deficit: Secondary | ICD-10-CM | POA: Diagnosis not present

## 2021-04-29 DIAGNOSIS — R41841 Cognitive communication deficit: Secondary | ICD-10-CM | POA: Diagnosis not present

## 2021-04-29 DIAGNOSIS — R490 Dysphonia: Secondary | ICD-10-CM | POA: Diagnosis not present

## 2021-04-29 DIAGNOSIS — R488 Other symbolic dysfunctions: Secondary | ICD-10-CM | POA: Diagnosis not present

## 2021-04-30 DIAGNOSIS — R488 Other symbolic dysfunctions: Secondary | ICD-10-CM | POA: Diagnosis not present

## 2021-04-30 DIAGNOSIS — R41841 Cognitive communication deficit: Secondary | ICD-10-CM | POA: Diagnosis not present

## 2021-04-30 DIAGNOSIS — R490 Dysphonia: Secondary | ICD-10-CM | POA: Diagnosis not present

## 2021-05-05 DIAGNOSIS — R41841 Cognitive communication deficit: Secondary | ICD-10-CM | POA: Diagnosis not present

## 2021-05-05 DIAGNOSIS — R490 Dysphonia: Secondary | ICD-10-CM | POA: Diagnosis not present

## 2021-05-05 DIAGNOSIS — R488 Other symbolic dysfunctions: Secondary | ICD-10-CM | POA: Diagnosis not present

## 2021-05-06 DIAGNOSIS — R488 Other symbolic dysfunctions: Secondary | ICD-10-CM | POA: Diagnosis not present

## 2021-05-06 DIAGNOSIS — R41841 Cognitive communication deficit: Secondary | ICD-10-CM | POA: Diagnosis not present

## 2021-05-06 DIAGNOSIS — R490 Dysphonia: Secondary | ICD-10-CM | POA: Diagnosis not present

## 2021-05-07 DIAGNOSIS — R41841 Cognitive communication deficit: Secondary | ICD-10-CM | POA: Diagnosis not present

## 2021-05-07 DIAGNOSIS — R488 Other symbolic dysfunctions: Secondary | ICD-10-CM | POA: Diagnosis not present

## 2021-05-07 DIAGNOSIS — R490 Dysphonia: Secondary | ICD-10-CM | POA: Diagnosis not present

## 2021-05-11 ENCOUNTER — Other Ambulatory Visit: Payer: Self-pay

## 2021-05-11 NOTE — Telephone Encounter (Signed)
On call message: Patient called with concerns about her BP and what Dr. Einar Gip wanted her to take.  ? ?I called patient back, NA, LMAM. ?

## 2021-05-11 NOTE — Progress Notes (Unsigned)
Primary Physician/Referring:  [name], MD  Patient ID: Gloria Anderson, female    DOB: 1933/05/04, 88 y.o.   MRN: [id number]  No chief complaint on file.  HPI:    Gloria Anderson  is a 88 y.o. 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  She is extremely sensitive to blood pressure variations.  She is also enrolled in   in Remote Patient Monitoring and Principal Care Management as patient is high risk for hospitalization and complications from underlying medical conditions.   Patient was last seen in our office 04/06/2021 at which time she was stable from a cardiovascular standpoint advised to follow-up in 6 months.  She however presents for urgent visit at her request with concerns of "fluctuating blood pressure".***  ***  This is a 86-monthoffice visit.  Except for occasional episodes of dizziness and palpitations, she has no specific complaints although she has written concerns that she brings with her today.  Continues to remain active.  She has not had any syncope or near syncope.  She does sleep on a wedge at night due to orthostatic hypertension.   Past Medical History:  Diagnosis Date   Arthritis    Complication of anesthesia    Constipation    Depression    GERD (gastroesophageal reflux disease)    Hypertension    Hypothyroidism    Osteoporosis    Pneumonia 2018   Polymyalgia ([place]    Polymyalgia rheumatica (HCC)    PONV (postoperative nausea and vomiting)    Past Surgical History:  Procedure Laterality Date   BASAL CELL CARCINOMA EXCISION  2022   on face   COLONOSCOPY W/ POLYPECTOMY     EYE SURGERY     both cataracts   LUMBAR LAMINECTOMY/DECOMPRESSION MICRODISCECTOMY N/A 08/15/2018   Procedure: Lumbar three to Lumbar five Decompressive lumbar laminectomy;  Surgeon: SErline Levine MD;  [facility] [facility]  Service:  Neurosurgery;  Laterality: N/A;   NASAL SINUS SURGERY     SHOULDER SURGERY Right 2007   repair   TONSILLECTOMY     TRIGGER FINGER RELEASE  03/19/2011   Procedure:  left middle finger RELEASE TRIGGER FINGER/A-1 PULLEY;  Surgeon: RCammie Sickle, MD;  [facility]  Service: Orthopedics;  Laterality: Right;  Procedure:  Release Right Long and Ring Trigger Fingers, Release Left Long Trigger Finger, Injection Left Long Proximal Phalangeal Joint   TRIGGER FINGER RELEASE Right    Ring finger, middle finger   UPPER GASTROINTESTINAL ENDOSCOPY     Social History   Tobacco Use   Smoking status: Never   Smokeless tobacco: Never  Substance Use Topics   Alcohol use: No    Alcohol/week: 0.0 standard drinks   ROS  Review of Systems  Cardiovascular:  Positive for palpitations. Negative for chest pain, dyspnea on exertion and leg swelling.  Gastrointestinal:  Negative for melena.  Neurological:  Positive for dizziness.  Psychiatric/Behavioral:  The patient is nervous/anxious (about health).    Objective  There were no vitals taken for this visit.  Vitals with BMI 04/06/2021 02/13/2021 01/28/2021  Height '5\' 4"'$  - -  Weight 116 lbs 3 oz - -  BMI 197.98- -  Systolic [id number] Diastolic 76 70 69  Pulse 84 84 83    No data found.   Physical Exam Vitals reviewed.  Constitutional:      Appearance: Normal appearance.  She is well-developed.  Cardiovascular:     Rate and Rhythm: Normal rate and regular rhythm.     Pulses: Intact distal pulses.          Carotid pulses are  on the right side with bruit and  on the left side with bruit.      Femoral pulses are  on the right side with bruit and  on the left side with bruit.    Heart sounds: Murmur heard.  Crescendo-decrescendo mid to late systolic murmur is present with a grade of 2/6 at the apex.  Pulmonary:     Effort: Pulmonary effort is normal. No accessory muscle usage or respiratory distress.     Breath sounds:  Normal breath sounds.  Abdominal:     General: Abdomen is flat. Bowel sounds are normal. There is abdominal bruit.     Palpations: Abdomen is soft.  Neurological:     General: No focal deficit present.     Mental Status: She is alert and oriented to person, place, and time.   Laboratory examination:   Recent Labs    08/20/20 1544 01/28/21 1504  NA 138 142  K 4.7 4.6  CL 100 105  CO2 28 29  GLUCOSE 128* 87  BUN 22 30*  CREATININE 1.21* 1.32*  CALCIUM 9.6 9.9  GFRNONAA 41*  --   GFRAA 48*  --     CrCl cannot be calculated (Patient's most recent lab result is older than the maximum 21 days allowed.).  CMP Latest Ref Rng & Units 01/28/2021 08/20/2020 03/21/2020  Glucose 65 - 99 mg/dL 87 128(H) 94  BUN 7 - 25 mg/dL 30(H) 22 27(H)  Creatinine 0.60 - 0.95 mg/dL 1.32(H) 1.21(H) 1.09(H)  Sodium 135 - 146 mmol/L 142 138 139  Potassium 3.5 - 5.3 mmol/L 4.6 4.7 4.5  Chloride 98 - 110 mmol/L 105 100 103  CO2 20 - 32 mmol/L '29 28 29  '$ Calcium 8.6 - 10.4 mg/dL 9.9 9.6 9.7  Total Protein 6.1 - 8.1 g/dL 6.9 6.8 6.8  Total Bilirubin 0.2 - 1.2 mg/dL 1.1 1.0 1.2  AST 10 - 35 U/L '31 29 25  '$ ALT 6 - 29 U/L '25 21 18   '$ CBC Latest Ref Rng & Units 01/28/2021 08/20/2020 03/21/2020  WBC 3.8 - 10.8 Thousand/uL 4.9 5.2 4.9  Hemoglobin 11.7 - 15.5 g/dL 10.7(L) 11.1(L) 11.4(L)  Hematocrit 35.0 - 45.0 % 31.5(L) 33.2(L) 33.5(L)  Platelets 140 - 400 Thousand/uL 163 184 186   External labs: Cholesterol, total 162.000 m 05/26/2020 HDL 79.000 mg 05/26/2020 LDL 74.000 mg 05/26/2020 Triglycerides 43.000 mg 05/26/2020  A1C 4.900 % 05/26/2020 Allergies  No Known Allergies    Medications Prior to Visit:   Outpatient Medications Prior to Visit  Medication Sig Dispense Refill   acetaminophen (TYLENOL) 325 MG tablet Take 650 mg by mouth every 6 (six) hours as needed.     Apoaequorin (PREVAGEN) 10 MG CAPS Take 10 mg by mouth daily after breakfast. 30 capsule 0   atorvastatin (LIPITOR) 10 MG tablet Take 10 mg  by mouth daily.      Calcium Citrate (CITRACAL PO) Take 1,300 mg by mouth daily. Taking it in the AM     carvedilol (COREG) 3.125 MG tablet TAKE 1 TABLET(3.125 MG) BY MOUTH FOUR TIMES DAILY (Patient taking differently: Take 3.125 mg by mouth as needed. TAKE 1 TABLET(3.125 MG) BY MOUTH FOUR TIMES DAILY) 360 tablet 3   clonazePAM (KLONOPIN) 0.5 MG tablet Take 1 tablet (0.5 mg  total) by mouth at bedtime. 90 tablet 0   denosumab (PROLIA) 60 MG/ML SOSY injection Inject 60 mg into the skin every 6 (six) months.     fish oil-omega-3 fatty acids 1000 MG capsule Take 1 g by mouth daily.      Garlic 8295 MG TBEC Take 1,000 mg by mouth daily. [place]     hydrALAZINE (APRESOLINE) 25 MG tablet TAKE 1 TABLET BY MOUTH THREE TIMES DAILY AS NEEDED FOR SYSTOLIC BLOOD PRESSURE GREATER THAN 150 270 tablet 3   hydroxychloroquine (PLAQUENIL) 200 MG tablet Take 1 tablet (200 mg total) by mouth daily. 90 tablet 0   levothyroxine (SYNTHROID, LEVOTHROID) 50 MCG tablet Take 50 mcg by mouth daily before breakfast.      Lifitegrast (XIIDRA OP) Apply 1 drop to eye 2 (two) times daily.     Multiple Vitamin (MULTIVITAMIN PO) Take by mouth daily.     omeprazole (PRILOSEC) 20 MG capsule Take 20 mg by mouth daily.     polyethylene glycol (MIRALAX / GLYCOLAX) packet Take 17 g by mouth daily as needed for moderate constipation.      Probiotic Product (PROBIOTIC DAILY PO) Take 1 tablet by mouth daily. [place]     Propylene Glycol (SYSTANE BALANCE OP) Apply 1 drop to eye as needed.     No facility-administered medications prior to visit.   Final Medications at End of Visit    No outpatient medications have been marked as taking for the 05/12/21 encounter (Appointment) with Rayetta Pigg, Jamariya Davidoff C, PA-C.   Radiology:  No results found.  Cardiac Studies:   Exercise Treadmill Stress Test 10/07/2017:  Indication: chest pain The patient exercised on Bruce protocol for  06:19 min. Patient achieved  7.40 METS and reached HR   114 bpm, which is  82 % of maximum age-predicted HR.  Stress test terminated due to fatigue.    Exercise capacity was fair for age. HR Response to Exercise: Appropriate. BP Response to Exercise: Normal resting BP- appropriate response. Chest Pain: none. Arrhythmias: Occasional PVC s. Resting EKG demonstrates Normal sinus rhythm. ST Changes: With peak exercise there was no ST-T changes of ischemia.   Overall Impression:  Submaximal stress test with no ischemic changes. Continue primary/secondary prevention.   Echocardiogram 12/13/2018: Normal LV systolic function with EF 67%. Left ventricle  cavity is normal in size. Normal left ventricular wall  thickness. Normal global wall motion. Diastolic function  could not be assessed due to severity of mitral  regurgitation.  Moderate (Grade II) mitral regurgitation. Moderate tricuspid regurgitation. Moderate pulmonary hypertension. Estimated pulmonary artery systolic pressure is  50 mmHg.  Small circumferential pericardial effusion wth no hemodynamic  compromise.  IVC is dilated with a respiratory response of <50%. Estimated  RA pressure 10-15 mmHg. No significant change compared to previous study on  05/09/2018.  Carotid artery duplex  07/04/2019: No hemodynamically significant arterial disease in the internal carotid artery bilaterally. Antegrade right vertebral artery flow. Antegrade left vertebral artery flow.  EKG:   ***   EKG 04/06/2021: Normal sinus rhythm at rate of 83 bpm, left atrial enlargement, otherwise normal EKG.  No significant change from 09/25/2020.  Assessment   No diagnosis found.   No orders of the defined types were placed in this encounter.    There are no discontinued medications.    Recommendations:   Gloria Anderson  is a 88 y.o. Caucasian female patient with with essential hypertension and orthostatic hypotension, chronic stage 3 CKD, mild hyperlipidemia, moderate MR and TR,  moderate pulmonary hypertension.   She is extremely sensitive to blood pressure variations.  Patient was last seen in our office 04/06/2021 at which time she was stable from a cardiovascular standpoint advised to follow-up in 6 months.  She however presents for urgent visit at her request with concerns of "fluctuating blood pressure".***  ***  She presents for 19-monthoffice visit, has questions written down including palpitations, multiple episodes of low and high blood pressure but no syncope.  Overall she has done very well in managing her blood pressure issues.  She has been taking carvedilol only if the systolic blood pressure is >130 mmHg.  She has not had any further significant dizziness or syncope.  States that when the blood pressure is low she is drinking some mineral water or electrolytes with rapid improvement in symptoms and also blood pressure.  She is wearing support stockings on a regular basis.  I reviewed the results of the ultrasound of the abdomen/kidneys that was performed by her PCP, reassured her.  She has medical renal disease and mild stage IIIa chronic kidney disease, labs reviewed, stable.  No changes in the medications were done today, I will see her back in 6 months.  With regard to moderate MR and moderate TR and moderate pulm hypertension, with no clinical evidence of heart failure, no change in physical exam, I do not want to order any other testing.  .Alethia Berthold MD, FThe [facility]3/13/2023, 2:42 PM Office: [phone]: [phone]

## 2021-05-12 ENCOUNTER — Other Ambulatory Visit: Payer: Self-pay

## 2021-05-12 ENCOUNTER — Encounter: Payer: Self-pay | Admitting: Student

## 2021-05-12 ENCOUNTER — Inpatient Hospital Stay: Payer: Medicare HMO

## 2021-05-12 ENCOUNTER — Ambulatory Visit: Payer: Medicare HMO | Admitting: Student

## 2021-05-12 VITALS — BP 143/69 | HR 91 | Temp 98.0°F | Resp 16 | Ht 64.0 in | Wt 115.0 lb

## 2021-05-12 DIAGNOSIS — I10 Essential (primary) hypertension: Secondary | ICD-10-CM | POA: Diagnosis not present

## 2021-05-12 DIAGNOSIS — I951 Orthostatic hypotension: Secondary | ICD-10-CM | POA: Diagnosis not present

## 2021-05-12 DIAGNOSIS — R41841 Cognitive communication deficit: Secondary | ICD-10-CM | POA: Diagnosis not present

## 2021-05-12 DIAGNOSIS — R488 Other symbolic dysfunctions: Secondary | ICD-10-CM | POA: Diagnosis not present

## 2021-05-12 DIAGNOSIS — R490 Dysphonia: Secondary | ICD-10-CM | POA: Diagnosis not present

## 2021-05-12 DIAGNOSIS — R002 Palpitations: Secondary | ICD-10-CM

## 2021-05-13 ENCOUNTER — Encounter: Payer: Self-pay | Admitting: Neurology

## 2021-05-13 ENCOUNTER — Ambulatory Visit: Payer: Medicare HMO | Admitting: Neurology

## 2021-05-13 VITALS — BP 164/93 | HR 104 | Ht 64.0 in | Wt 114.5 lb

## 2021-05-13 DIAGNOSIS — R69 Illness, unspecified: Secondary | ICD-10-CM | POA: Diagnosis not present

## 2021-05-13 DIAGNOSIS — F514 Sleep terrors [night terrors]: Secondary | ICD-10-CM

## 2021-05-13 DIAGNOSIS — R208 Other disturbances of skin sensation: Secondary | ICD-10-CM | POA: Insufficient documentation

## 2021-05-13 MED ORDER — NONFORMULARY OR COMPOUNDED ITEM: 1 refills | Status: DC

## 2021-05-13 NOTE — Progress Notes (Signed)
?[facility] ? ?Provider:  Larey Seat, MD  ? ? ?Referring Provider: [name], MD ?Primary Care Physician:  Gloria Bowen, MD ? ?Chief Complaint  ?Patient presents with  ? Follow-up  ?  Rm 11, alone. Pt reports doing well. States prevagen has been working well. Pt refused to do [place].   ? ? ?HPI:   ?This patient of Dr. 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 She tolerates Klonopin and is happy with the REM sleep control. She has developed higher blood pressures spurs of higher blood pressures that Dr. Forde Anderson and Dr. Einar Anderson have followed, and initiated treatment- she developed orthostatic BP!Marland Kitchen She was found to be anemic, sleepy and fatigued. Has normal B 12 and started oral iron. She has a history of PMR. Polymyalgia rheumatica. She had a normal colonoscopy in 2016. Had lost weight , but recovered . Gloria Anderson brought her last laboratory results with her, white blood cell count was 3.74K, red blood cell count was 2.9, hemoglobin 9.8 g, hematocrit 28.4%, corpuscular volume 97.1 MCH 33.5. ?She was over the phone prescribed Nu-iron to take as a supplement. Needs TIBC, Total iron deficiency,  ? ?09-26-13- The patient's sleep study from 08-05-13 revealed no apnea, no oxygen desaturation, no irregular heart beats, and only took clusters of limb movements that seem to be related to discomfort with sleeping on the left side. She had no periodic limb movements during REM sleep. There was a prolonged period of slow-wave sleep at around 2 AM during which nor night terrors sleep walking or sleep talking was ordered. ?The patient reported  that she had another spell- a feeling as if something brushes up on her scalp, or behind her head.  ?Since the patient has a long-standing bruit that she can hear at night and that bothers her( tinnitus) I have also suggested  to do an MRA of the brain , as I am concerned that they're tortuous vessels , which  may be affecting the left frontal lobe perfusion. This could be a trigger for a nocturnal event as described by the patient clinically. ?Her MRi documented to my surprise exactly that ! An intracerebral  Venous- cavernous malformation. We are now meeting on 09-26-13 to discuss the results and documented the findings. She had more Night terrors the last weeks. The gabapentin gave her diplopia. I suggest now Topiramate, but she reminded me of involuntary weight loss and loss of apetite. I will try a very low dose of KLONOPIN.  The patient should watch her BP and avoid straining , pressure inducing activities.  ? ?RV 02-27-14  ?Discussed EEG results, no epileptiform activity , but frontal sharps noted. These are of unclear significance.  ?See report. No change in meds.needed  ?Patient is free to travel to [place] in April.  ?The patient,  a caucasian right handed female , originally from [place], reports vivid dreams, with fearful frightful content.  ?Her dreams have let her to act out some of the dream content, she wakes with her arms extended and with palpitations, tachycardia form fright. She kicks and fell out of bed, yelling, calling out . Following  one dream last year,  she injured her face in a fall . She begun covering all sharp edges.  She has polymyalgia rheumatica and was treated with prednisone , gaining weight through this time ?She rises at 8.30, the alarm wakes her at  8.  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 No childhood sleep disorder history. Usually a 7 -8 hour sleeper , refreshed.  ? ? ?Soc history : The patient is widowed for 12 years, has adult children and grandchildren.The bruit in her left temple keeps her form sleeping on the side. She has 3 carotid dopplers over 25 years, diagnosed with  tortious vessel.  ?MRI brain was normal 4 year ago ( Dr. Carloyn Anderson)  ?No MRA was done at the time , I like to rule out a vascular  abnormality at the frontal lobe. ? ?08-04-2015 ?Her night terrors have responded to the medication, and her spells are rare now. She returned from [place] and had one event which "scared her sister to death "  ?I have spoken to Gloria Anderson about the possible component of cavernous vascular malformations producing a seizure event. I ordered a 48 hour study but the patient underwent a 72 hour study. This was normal- no seizure activity,  ?She is doing well on Klonopin. She gained weight on klonopin, (a fact she likes!) ?She reports vertigo, dizzines with rapid head movementns and bending down, also controlled with Klonopin.  Her eye feel as if pulsating - can this be the AVM? HTN was addressed in the last 2 visits with endocrinology / PCP.  ?She joked today that her weight has been stabile for so long, she had no excuse for a new wardrobe !  ?She had no spells for the last 4 month. She needs no refills today. ? ?Interval history from 02/03/2016. Gloria Anderson reports today that after being almost free of vivid dreams for several month she has returned having some not to the same intensity and not the same frequency as prior to treatment. Some of them are scary they have the character of the night may or or night terror, and she responds was higher heart rate feeling under stress. Klonopin has been working very well at a very low dose for her by now for over 2 years. I would like to continue using Klonopin with the option that if the medication wears off she could increase the dose. She also does not have insomnia when taking Klonopin. She is not fatigued and she is not excessively daytime sleepy, she also does not report that she is groggy in the mornings. She feels stable well-balanced and not at a risk of falling. I will refill her medication today. I also congratulated her to her 88th birthday, which she celebrates in style this coming Saturday. ? ?Travel history from 15 February 2017, Gloria Anderson has traveled  to summer to [place] with some difficulties.  She had no medical problems however.  She brought me her Klonopin prescription but she had not needed to fill and which is now a year old.  I will refill her Klonopin through [facility] today. She has no complain of grogginess or mental cloudiness in the morning. She has night terrors. She has noted that she is not easily scared , not panicked.  ? ?07-26-2017, ?Ricki Vanhandel is a 88 y.o. female patient for follow up on concerns of cognitive function, memory.  She seems well, is alert and happy. She has a full social life.  ?She is relieved that the [facility] was normal 26-30 points. Balance is reportedly poor, she is careful.  She continues to report hip pain, and has some night spells- but much better than before Klonopin was initiated.  ?Was seen by Dr. Einar Anderson cardiology for BP variability- he asked  Her to start wearing compression stockings and elevate her feet at night. She has not always hydrated well, and she is struggling with HTN and orthostatic lightheadedness.  ? ?Today 10/05/17: ? ?Ms. Dolbow is an 88 year old female with a history of memory disturbance and night terrors.  She returns today to discuss her balance.  She states that she went to see her primary care after she had an episode while ambulating.  She reports that she was walking with a neighbor.  15 minutes in to her walk she lost sensation in the lower part of her back and in her ankles.  She reports that she felt like she had no control over her balance.  She states that she had to sit down.  She reports after that event she had some mild episodes but since then this has resolved and she has not had any trouble walking.  She states that she went for a walk last night and did not have any symptoms.  She states that she has found if she uses a cane this offers her more stability.  In the past CT of the lumbar spine as well as MRI of the lumbar spine shows stenosis.  She denies any changes with  her bowels or bladder.  Denies any numbness or tingling in the lower extremities.  She returns today for an evaluation. ? ?HISTORY Baily Hovanec is a 88 y.o. female patient for follow up on concerns of cognit

## 2021-05-13 NOTE — Patient Instructions (Signed)
Topical treatment for scalp itching.  ?

## 2021-05-14 DIAGNOSIS — R41841 Cognitive communication deficit: Secondary | ICD-10-CM | POA: Diagnosis not present

## 2021-05-14 DIAGNOSIS — R488 Other symbolic dysfunctions: Secondary | ICD-10-CM | POA: Diagnosis not present

## 2021-05-14 DIAGNOSIS — R490 Dysphonia: Secondary | ICD-10-CM | POA: Diagnosis not present

## 2021-05-16 ENCOUNTER — Emergency Department (HOSPITAL_COMMUNITY): Payer: Medicare HMO

## 2021-05-16 ENCOUNTER — Emergency Department (HOSPITAL_COMMUNITY)
Admission: EM | Admit: 2021-05-16 | Discharge: 2021-05-17 | Disposition: A | Discharge status: Home / Self Care | Payer: Medicare HMO | Attending: Emergency Medicine | Admitting: Emergency Medicine

## 2021-05-16 DIAGNOSIS — N179 Acute kidney failure, unspecified: Secondary | ICD-10-CM | POA: Insufficient documentation

## 2021-05-16 DIAGNOSIS — R410 Disorientation, unspecified: Secondary | ICD-10-CM | POA: Insufficient documentation

## 2021-05-16 DIAGNOSIS — I517 Cardiomegaly: Secondary | ICD-10-CM | POA: Diagnosis not present

## 2021-05-16 DIAGNOSIS — E039 Hypothyroidism, unspecified: Secondary | ICD-10-CM | POA: Diagnosis not present

## 2021-05-16 DIAGNOSIS — E860 Dehydration: Secondary | ICD-10-CM | POA: Insufficient documentation

## 2021-05-16 DIAGNOSIS — R7989 Other specified abnormal findings of blood chemistry: Secondary | ICD-10-CM | POA: Diagnosis not present

## 2021-05-16 DIAGNOSIS — I10 Essential (primary) hypertension: Secondary | ICD-10-CM | POA: Insufficient documentation

## 2021-05-16 DIAGNOSIS — R296 Repeated falls: Secondary | ICD-10-CM | POA: Diagnosis not present

## 2021-05-16 DIAGNOSIS — Z79899 Other long term (current) drug therapy: Secondary | ICD-10-CM | POA: Diagnosis not present

## 2021-05-16 DIAGNOSIS — M47816 Spondylosis without myelopathy or radiculopathy, lumbar region: Secondary | ICD-10-CM | POA: Diagnosis not present

## 2021-05-16 DIAGNOSIS — D649 Anemia, unspecified: Secondary | ICD-10-CM | POA: Diagnosis not present

## 2021-05-16 DIAGNOSIS — Z043 Encounter for examination and observation following other accident: Secondary | ICD-10-CM | POA: Diagnosis not present

## 2021-05-16 DIAGNOSIS — W19XXXA Unspecified fall, initial encounter: Secondary | ICD-10-CM | POA: Insufficient documentation

## 2021-05-16 DIAGNOSIS — R4182 Altered mental status, unspecified: Secondary | ICD-10-CM | POA: Diagnosis not present

## 2021-05-16 DIAGNOSIS — I739 Peripheral vascular disease, unspecified: Secondary | ICD-10-CM | POA: Diagnosis not present

## 2021-05-16 LAB — CBC WITH DIFFERENTIAL/PLATELET
Abs Immature Granulocytes: 0.02 K/uL (ref 0.00–0.07)
Basophils Absolute: 0 K/uL (ref 0.0–0.1)
Basophils Relative: 0 %
Eosinophils Absolute: 0 K/uL (ref 0.0–0.5)
Eosinophils Relative: 1 %
HCT: 32.5 % — ABNORMAL LOW (ref 36.0–46.0)
Hemoglobin: 11.7 g/dL — ABNORMAL LOW (ref 12.0–15.0)
Immature Granulocytes: 0 %
Lymphocytes Relative: 14 %
Lymphs Abs: 1 K/uL (ref 0.7–4.0)
MCH: 33.8 pg (ref 26.0–34.0)
MCHC: 36 g/dL (ref 30.0–36.0)
MCV: 93.9 fL (ref 80.0–100.0)
Monocytes Absolute: 0.4 K/uL (ref 0.1–1.0)
Monocytes Relative: 6 %
Neutro Abs: 5.7 K/uL (ref 1.7–7.7)
Neutrophils Relative %: 79 %
Platelets: 171 K/uL (ref 150–400)
RBC: 3.46 MIL/uL — ABNORMAL LOW (ref 3.87–5.11)
RDW: 13.4 % (ref 11.5–15.5)
WBC: 7.2 K/uL (ref 4.0–10.5)
nRBC: 0 % (ref 0.0–0.2)

## 2021-05-16 LAB — COMPREHENSIVE METABOLIC PANEL
ALT: 24 U/L (ref 0–44)
AST: 37 U/L (ref 15–41)
Albumin: 4.2 g/dL (ref 3.5–5.0)
Alkaline Phosphatase: 43 U/L (ref 38–126)
Anion gap: 10 (ref 5–15)
BUN: 34 mg/dL — ABNORMAL HIGH (ref 8–23)
CO2: 27 mmol/L (ref 22–32)
Calcium: 10.5 mg/dL — ABNORMAL HIGH (ref 8.9–10.3)
Chloride: 101 mmol/L (ref 98–111)
Creatinine, Ser: 1.67 mg/dL — ABNORMAL HIGH (ref 0.44–1.00)
GFR, Estimated: 30 mL/min — ABNORMAL LOW (ref >60)
Glucose, Bld: 111 mg/dL — ABNORMAL HIGH (ref 70–99)
Potassium: 4.3 mmol/L (ref 3.5–5.1)
Sodium: 138 mmol/L (ref 135–145)
Total Bilirubin: 1.6 mg/dL — ABNORMAL HIGH (ref 0.3–1.2)
Total Protein: 6.8 g/dL (ref 6.5–8.1)

## 2021-05-16 LAB — URINALYSIS, ROUTINE W REFLEX MICROSCOPIC
Bilirubin Urine: NEGATIVE
Glucose, UA: NEGATIVE mg/dL
Hgb urine dipstick: NEGATIVE
Ketones, ur: NEGATIVE mg/dL
Leukocytes,Ua: NEGATIVE
Nitrite: NEGATIVE
Protein, ur: NEGATIVE mg/dL
Specific Gravity, Urine: 1.018 (ref 1.005–1.030)
pH: 5 (ref 5.0–8.0)

## 2021-05-16 LAB — RAPID URINE DRUG SCREEN, HOSP PERFORMED
Amphetamines: NOT DETECTED
Barbiturates: NOT DETECTED
Benzodiazepines: NOT DETECTED
Cocaine: NOT DETECTED
Opiates: NOT DETECTED
Tetrahydrocannabinol: NOT DETECTED

## 2021-05-16 LAB — ETHANOL: Alcohol, Ethyl (B): <10 mg/dL

## 2021-05-16 LAB — CBG MONITORING, ED: Glucose-Capillary: 95 mg/dL (ref 70–99)

## 2021-05-16 LAB — AMMONIA: Ammonia: <10 umol/L (ref 9–35)

## 2021-05-16 MED ORDER — LACTATED RINGERS IV BOLUS
1000.0000 mL | Freq: Once | INTRAVENOUS | Status: AC
  Administered 2021-05-16: 1000 mL via INTRAVENOUS

## 2021-05-16 NOTE — ED Triage Notes (Signed)
Pt reports multiple falls recently, up to 3x per week. Pt not using walker/cane. Pt states she fell today PTA, injuring L tail bone. Pt denies head injury. No anticoagulants.  ?

## 2021-05-16 NOTE — ED Provider Notes (Signed)
?[facility] ?Provider Note ? ? ?CSN: [id number] ?Arrival date & time: 05/16/21  1326 ? ?  ? ?History ? ?Chief Complaint  ?Patient presents with  ? Fall  ? ? ?Gloria Anderson is a 88 y.o. female is an 88 year old female who presents with multiple falls recently, 3 in the last week.  Patient lives at [facility].  She states that she will suddenly become lightheaded like she is going to fall when she changes position quickly and then falls firmly onto her buttocks.  She denies any recent head trauma, endorses soreness in her tailbone and her left buttock. ? ?She is accompanied by at the bedside by her son, Gerald Stabs, who states that she has been seeming confused this week, cognitively having more trouble with confusion and difficulty following directions than is normal for her.  He does state that his mother is a very poor eater.  States she does not like the food at her independent living facility but does not want to cook for self therefore she does not eat very often.  Additionally she does not drink fluids consistently at home. ? ?I have personally reviewed this patient's medical records.  She has history of hypertension, osteoporosis, polymyalgia rheumatica, hypothyroidism, GERD depression.  She is not anticoagulated.  She is on Plaquenil. ? ?HPI ? ?  ? ?Home Medications ?Prior to Admission medications   ?Medication Sig Start Date End Date Taking? Authorizing Provider  ?acetaminophen (TYLENOL) 325 MG tablet Take 325 mg by mouth every 6 (six) hours as needed for moderate pain or headache.   Yes [provider]  ?Apoaequorin (PREVAGEN) 10 MG CAPS Take 10 mg by mouth daily after breakfast. 08/05/20  Yes Dohmeier, Asencion Partridge, MD  ?atorvastatin (LIPITOR) 10 MG tablet Take 10 mg by mouth every evening. 01/05/16  Yes [provider]  ?BIOTIN FORTE PO Take 1 capsule by mouth daily.   Yes [provider]  ?Calcium Citrate (CITRACAL PO) Take 2  tablets by mouth in the morning and at bedtime.   Yes [provider]  ?carvedilol (COREG) 3.125 MG tablet TAKE 1 TABLET(3.125 MG) BY MOUTH FOUR TIMES DAILY ?Patient taking differently: Take 3.125 mg by mouth as needed (high blood pressure). 04/18/19  Yes Adrian Prows, MD  ?clonazePAM (KLONOPIN) 0.5 MG tablet Take 1 tablet (0.5 mg total) by mouth at bedtime. 04/07/21  Yes Marcial Pacas, MD  ?denosumab (PROLIA) 60 MG/ML SOSY injection Inject 60 mg into the skin every 6 (six) months.   Yes [provider]  ?fish oil-omega-3 fatty acids 1000 MG capsule Take 1 g by mouth daily.    Yes [provider]  ?hydrALAZINE (APRESOLINE) 25 MG tablet TAKE 1 TABLET BY MOUTH THREE TIMES DAILY AS NEEDED FOR SYSTOLIC BLOOD PRESSURE GREATER THAN 150 ?Patient taking differently: Take 25 mg by mouth daily as needed (systolic bp greater than 854). 03/12/20  Yes Adrian Prows, MD  ?hydroxychloroquine (PLAQUENIL) 200 MG tablet Take 1 tablet (200 mg total) by mouth daily. 03/30/21  Yes Ofilia Neas, PA-C  ?levothyroxine (SYNTHROID, LEVOTHROID) 50 MCG tablet Take 50 mcg by mouth daily before breakfast.    Yes [provider]  ?Lifitegrast Shirley Friar OP) Apply 1 drop to eye 2 (two) times daily.   Yes [provider]  ?Multiple Vitamin (MULTIVITAMIN PO) Take 1 tablet by mouth daily.   Yes [provider]  ?omeprazole (PRILOSEC) 20 MG capsule Take 20 mg by mouth daily.   Yes [provider]  ?  OVER THE COUNTER MEDICATION Take 2 tablets by mouth in the morning and at bedtime. Calcium mini   Yes [provider]  ?polyethylene glycol (MIRALAX / GLYCOLAX) packet Take 17 g by mouth daily as needed for moderate constipation.    Yes [provider]  ?Probiotic Product (PROBIOTIC DAILY PO) Take 1 tablet by mouth daily. Culturelle   Yes [provider]  ?Propylene Glycol (SYSTANE BALANCE OP) Apply 1 drop to eye as needed.   Yes [provider]  ?NONFORMULARY OR COMPOUNDED  ITEM 100 ml of liquid : Amitriptyline 1%, ketamine 5.6% for topical application to the scalp. 05/13/21   Dohmeier, Asencion Partridge, MD  ?   ? ?Allergies    ?Patient has no known allergies.   ? ?Review of Systems   ?Review of Systems  ?Constitutional: Negative.   ?HENT: Negative.    ?Respiratory: Negative.    ?Cardiovascular: Negative.   ?Gastrointestinal: Negative.   ?Genitourinary: Negative.   ?Musculoskeletal:   ?     Pain in buttock after fall  ?Skin: Negative.   ?Neurological: Negative.   ?Psychiatric/Behavioral:  Positive for confusion and decreased concentration. Negative for sleep disturbance and suicidal ideas.   ? ?Physical Exam ?Updated Vital Signs ?BP (!) 159/80   Pulse 98   Temp 97.9 ?F (36.6 ?C) (Oral)   Resp 18   Ht '5\' 4"'$  (1.626 m)   Wt 51.7 kg   SpO2 98%   BMI 19.57 kg/m?  ?Physical Exam ?Vitals and nursing note reviewed.  ?Constitutional:   ?   Appearance: She is not ill-appearing or toxic-appearing.  ?HENT:  ?   Head: Normocephalic and atraumatic.  ?   Nose: Nose normal.  ?   Mouth/Throat:  ?   Mouth: Mucous membranes are moist.  ?   Pharynx: Oropharynx is clear. Uvula midline. No oropharyngeal exudate or posterior oropharyngeal erythema.  ?   Tonsils: No tonsillar exudate.  ?Eyes:  ?   General: Lids are normal. Vision grossly intact.     ?   Right eye: No discharge.     ?   Left eye: No discharge.  ?   Extraocular Movements: Extraocular movements intact.  ?   Conjunctiva/sclera: Conjunctivae normal.  ?   Pupils: Pupils are equal, round, and reactive to light.  ?Neck:  ?   Trachea: Trachea and phonation normal.  ?   Meningeal: Brudzinski's sign and Kernig's sign absent.  ?Cardiovascular:  ?   Rate and Rhythm: Normal rate and regular rhythm.  ?   Pulses: Normal pulses.  ?   Heart sounds: Normal heart sounds. No murmur heard. ?Pulmonary:  ?   Effort: Pulmonary effort is normal. No tachypnea, bradypnea, accessory muscle usage, prolonged expiration or respiratory distress.  ?   Breath sounds: Normal breath  sounds. No wheezing or rales.  ?Chest:  ?   Chest wall: No mass, lacerations, deformity, swelling, tenderness, crepitus or edema.  ?Abdominal:  ?   General: Bowel sounds are normal. There is no distension.  ?   Palpations: Abdomen is soft.  ?   Tenderness: There is no abdominal tenderness. There is no right CVA tenderness, left CVA tenderness, guarding or rebound.  ?Musculoskeletal:     ?   General: No deformity.  ?   Cervical back: Normal range of motion and neck supple. No bony tenderness.  ?   Thoracic back: No bony tenderness.  ?   Lumbar back: No bony tenderness.  ?     Back: ? ?  Right lower leg: No edema.  ?   Left lower leg: No edema.  ?Lymphadenopathy:  ?   Cervical: No cervical adenopathy.  ?Skin: ?   General: Skin is warm and dry.  ?   Capillary Refill: Capillary refill takes less than 2 seconds.  ?Neurological:  ?   General: No focal deficit present.  ?   Mental Status: She is alert and oriented to person, place, and time. Mental status is at baseline.  ?   GCS: GCS eye subscore is 4. GCS verbal subscore is 5. GCS motor subscore is 6.  ?   Cranial Nerves: Cranial nerves 2-12 are intact.  ?   Sensory: Sensation is intact.  ?   Motor: Motor function is intact.  ?   Coordination: Romberg sign positive.  ?   Gait: Gait is intact.  ?Psychiatric:     ?   Mood and Affect: Mood normal.  ? ? ?ED Results / Procedures / Treatments   ?Labs ?(all labs ordered are listed, but only abnormal results are displayed) ?Labs Reviewed  ?COMPREHENSIVE METABOLIC PANEL - Abnormal; Notable for the following components:  ?    Result Value  ? Glucose, Bld 111 (*)   ? BUN 34 (*)   ? Creatinine, Ser 1.67 (*)   ? Calcium 10.5 (*)   ? Total Bilirubin 1.6 (*)   ? GFR, Estimated 30 (*)   ? All other components within normal limits  ?CBC WITH DIFFERENTIAL/PLATELET - Abnormal; Notable for the following components:  ? RBC 3.46 (*)   ? Hemoglobin 11.7 (*)   ? HCT 32.5 (*)   ? All other components within normal limits  ?URINALYSIS, ROUTINE W  REFLEX MICROSCOPIC - Abnormal; Notable for the following components:  ? Color, Urine AMBER (*)   ? APPearance HAZY (*)   ? All other components within normal limits  ?URINE CULTURE  ?AMMONIA  ?RAPID URIN

## 2021-05-17 ENCOUNTER — Emergency Department (HOSPITAL_COMMUNITY): Payer: Medicare HMO

## 2021-05-17 DIAGNOSIS — R296 Repeated falls: Secondary | ICD-10-CM | POA: Diagnosis not present

## 2021-05-17 DIAGNOSIS — I739 Peripheral vascular disease, unspecified: Secondary | ICD-10-CM | POA: Diagnosis not present

## 2021-05-17 LAB — TSH: TSH: 1.243 u[IU]/mL (ref 0.350–4.500)

## 2021-05-17 LAB — FOLATE: Folate: 52.5 ng/mL (ref >5.9)

## 2021-05-17 LAB — AMMONIA: Ammonia: 19 umol/L (ref 9–35)

## 2021-05-17 LAB — VITAMIN B12: Vitamin B-12: 1421 pg/mL — ABNORMAL HIGH (ref 180–914)

## 2021-05-17 NOTE — ED Provider Notes (Signed)
Assumed care at shift change.  See prior note for full H&P.  Briefly, 88 year old female presenting to the ED with multiple falls in the past week due to lightheadedness and dizziness.  Has outpatient neurology, just saw them 05/13/2021. ? ?Plan:  work-up thus far reassuring.  Obtain MRI to r/o occult stroke.  Per neurology, if negative ok to d/c home to follow-up with neurology. ? ?Results for orders placed or performed during the hospital encounter of 05/16/21  ?Comprehensive metabolic panel  ?Result Value Ref Range  ? Sodium 138 135 - 145 mmol/L  ? Potassium 4.3 3.5 - 5.1 mmol/L  ? Chloride 101 98 - 111 mmol/L  ? CO2 27 22 - 32 mmol/L  ? Glucose, Bld 111 (H) 70 - 99 mg/dL  ? BUN 34 (H) 8 - 23 mg/dL  ? Creatinine, Ser 1.67 (H) 0.44 - 1.00 mg/dL  ? Calcium 10.5 (H) 8.9 - 10.3 mg/dL  ? Total Protein 6.8 6.5 - 8.1 g/dL  ? Albumin 4.2 3.5 - 5.0 g/dL  ? AST 37 15 - 41 U/L  ? ALT 24 0 - 44 U/L  ? Alkaline Phosphatase 43 38 - 126 U/L  ? Total Bilirubin 1.6 (H) 0.3 - 1.2 mg/dL  ? GFR, Estimated 30 (L) >60 mL/min  ? Anion gap 10 5 - 15  ?CBC WITH DIFFERENTIAL  ?Result Value Ref Range  ? WBC 7.2 4.0 - 10.5 K/uL  ? RBC 3.46 (L) 3.87 - 5.11 MIL/uL  ? Hemoglobin 11.7 (L) 12.0 - 15.0 g/dL  ? HCT 32.5 (L) 36.0 - 46.0 %  ? MCV 93.9 80.0 - 100.0 fL  ? MCH 33.8 26.0 - 34.0 pg  ? MCHC 36.0 30.0 - 36.0 g/dL  ? RDW 13.4 11.5 - 15.5 %  ? Platelets 171 150 - 400 K/uL  ? nRBC 0.0 0.0 - 0.2 %  ? Neutrophils Relative % 79 %  ? Neutro Abs 5.7 1.7 - 7.7 K/uL  ? Lymphocytes Relative 14 %  ? Lymphs Abs 1.0 0.7 - 4.0 K/uL  ? Monocytes Relative 6 %  ? Monocytes Absolute 0.4 0.1 - 1.0 K/uL  ? Eosinophils Relative 1 %  ? Eosinophils Absolute 0.0 0.0 - 0.5 K/uL  ? Basophils Relative 0 %  ? Basophils Absolute 0.0 0.0 - 0.1 K/uL  ? Immature Granulocytes 0 %  ? Abs Immature Granulocytes 0.02 0.00 - 0.07 K/uL  ?Urinalysis, Routine w reflex microscopic  ?Result Value Ref Range  ? Color, Urine AMBER (A) YELLOW  ? APPearance HAZY (A) CLEAR  ? Specific  Gravity, Urine 1.018 1.005 - 1.030  ? pH 5.0 5.0 - 8.0  ? Glucose, UA NEGATIVE NEGATIVE mg/dL  ? Hgb urine dipstick NEGATIVE NEGATIVE  ? Bilirubin Urine NEGATIVE NEGATIVE  ? Ketones, ur NEGATIVE NEGATIVE mg/dL  ? Protein, ur NEGATIVE NEGATIVE mg/dL  ? Nitrite NEGATIVE NEGATIVE  ? Leukocytes,Ua NEGATIVE NEGATIVE  ?Ammonia  ?Result Value Ref Range  ? Ammonia <10 9 - 35 umol/L  ?Urine rapid drug screen (hosp performed)  ?Result Value Ref Range  ? Opiates NONE DETECTED NONE DETECTED  ? Cocaine NONE DETECTED NONE DETECTED  ? Benzodiazepines NONE DETECTED NONE DETECTED  ? Amphetamines NONE DETECTED NONE DETECTED  ? Tetrahydrocannabinol NONE DETECTED NONE DETECTED  ? Barbiturates NONE DETECTED NONE DETECTED  ?Ethanol  ?Result Value Ref Range  ? Alcohol, Ethyl (B) <10 <10 mg/dL  ?CBG monitoring, ED  ?Result Value Ref Range  ? Glucose-Capillary 95 70 - 99 mg/dL  ? ?DG  Chest 1 View ? ?Result Date: 05/16/2021 ?CLINICAL DATA:  Altered mental status EXAM: CHEST  1 VIEW COMPARISON:  None. FINDINGS: Cardiomegaly noted. There is no evidence of focal airspace disease, pulmonary edema, suspicious pulmonary nodule/mass, pleural effusion, or pneumothorax. No acute bony abnormalities are identified. Metallic device overlying the LEFT chest noted. IMPRESSION: Cardiomegaly without evidence of acute cardiopulmonary disease. Electronically Signed   By: Margarette Canada M.D.   On: 05/16/2021 19:08  ? ?DG Sacrum/Coccyx ? ?Result Date: 05/16/2021 ?CLINICAL DATA:  Fall EXAM: SACRUM AND COCCYX - 2+ VIEW COMPARISON:  None. FINDINGS: Visualized bony pelvis appears intact. No displaced sacrococcygeal fracture on the lateral view. Mild degenerative changes of the lower lumbar spine. IMPRESSION: Negative. Electronically Signed   By: Julian Hy M.D.   On: 05/16/2021 22:31  ? ?CT HEAD WO CONTRAST ? ?Result Date: 05/16/2021 ?CLINICAL DATA:  Multiple falls, delirium EXAM: CT HEAD WITHOUT CONTRAST TECHNIQUE: Contiguous axial images were obtained from the  base of the skull through the vertex without intravenous contrast. RADIATION DOSE REDUCTION: This exam was performed according to the departmental dose-optimization program which includes automated exposure control, adjustment of the mA and/or kV according to patient size and/or use of iterative reconstruction technique. COMPARISON:  MR brain, 08/16/2019 FINDINGS: Brain: No evidence of acute infarction, hemorrhage, hydrocephalus, extra-axial collection or mass lesion/mass effect. Mild periventricular white matter hypodensity. Vascular: No hyperdense vessel or unexpected calcification. Skull: Normal. Negative for fracture or focal lesion. Sinuses/Orbits: No acute finding. Other: None. IMPRESSION: No acute intracranial pathology. Mild small-vessel white matter disease. Electronically Signed   By: Delanna Ahmadi M.D.   On: 05/16/2021 19:08  ? ?MR BRAIN WO CONTRAST ? ?Result Date: 05/17/2021 ?CLINICAL DATA:  Multiple recent falls EXAM: MRI HEAD WITHOUT CONTRAST TECHNIQUE: Multiplanar, multiecho pulse sequences of the brain and surrounding structures were obtained without intravenous contrast. COMPARISON:  08/16/2019 FINDINGS: Brain: No acute infarct, mass effect or extra-axial collection. There unchanged cavernous malformations in the left cerebellum and at the right basal ganglia. No acute hemorrhage. There is multifocal hyperintense T2-weighted signal within the white matter. Generalized volume loss without a clear lobar predilection. The midline structures are normal. Vascular: Major flow voids are preserved. Skull and upper cervical spine: Normal calvarium and skull base. Visualized upper cervical spine and soft tissues are normal. Sinuses/Orbits:No paranasal sinus fluid levels or advanced mucosal thickening. No mastoid or middle ear effusion. Normal orbits. IMPRESSION: 1. No acute intracranial abnormality. 2. Unchanged cavernous malformations in the left cerebellum and right basal ganglia. 3. Findings of chronic  small vessel disease and volume loss. Electronically Signed   By: Ulyses Jarred M.D.   On: 05/17/2021 02:19  ? ?DG Hip Unilat W or Wo Pelvis 2-3 Views Left ? ?Result Date: 05/16/2021 ?CLINICAL DATA:  Fall EXAM: DG HIP (WITH OR WITHOUT PELVIS) 2-3V LEFT COMPARISON:  None. FINDINGS: No fracture or dislocation is seen. Bilateral hip joint spaces are preserved. Visualized bony pelvis appears intact. Mild degenerative changes of the lower lumbar spine. IMPRESSION: Negative. Electronically Signed   By: Julian Hy M.D.   On: 05/16/2021 22:30  ? ?US Abdomen Limited RUQ (LIVER/GB) ? ?Result Date: 05/16/2021 ?CLINICAL DATA:  Elevated serum bilirubin EXAM: ULTRASOUND ABDOMEN LIMITED RIGHT UPPER QUADRANT COMPARISON:  None. FINDINGS: Gallbladder: No gallstones or wall thickening visualized. No sonographic Murphy sign noted by sonographer. Common bile duct: Diameter: 5 mm in proximal diameter Liver: No focal lesion identified. Within normal limits in parenchymal echogenicity. Portal vein is patent on color Doppler imaging with  normal direction of blood flow towards the liver. Other: At least 2 simple cortical cysts are seen within the right kidney, not fully evaluated on this examination. Right renal cortical echogenicity appears mildly increased suggesting changes of underlying medical renal disease. IMPRESSION: No acute abnormality. Increased right renal cortical echogenicity suggesting changes of underlying medical renal disease. Electronically Signed   By: Fidela Salisbury M.D.   On: 05/16/2021 21:46   ? ?MRI is negative for acute findings.  Vitamin studies in process.  Per plan with neurology, ok for discharge to follow-up with her OP neurologist in the next week.  Can return here for any new/acute changes. ?  ?Larene Pickett, PA-C ?05/17/21 0505 ? ?  Veatrice Kells, MD ?05/17/21 0547 ? ?

## 2021-05-17 NOTE — Discharge Instructions (Addendum)
Your MRI today did not show anything new, no stroke, etc. ?We recommend that you follow-up with your neurologist. ?Return here for new concerns. ? ?

## 2021-05-18 ENCOUNTER — Telehealth: Payer: Self-pay | Admitting: Neurology

## 2021-05-18 DIAGNOSIS — F514 Sleep terrors [night terrors]: Secondary | ICD-10-CM

## 2021-05-18 DIAGNOSIS — R419 Unspecified symptoms and signs involving cognitive functions and awareness: Secondary | ICD-10-CM

## 2021-05-18 DIAGNOSIS — Q283 Other malformations of cerebral vessels: Secondary | ICD-10-CM

## 2021-05-18 DIAGNOSIS — R208 Other disturbances of skin sensation: Secondary | ICD-10-CM

## 2021-05-18 LAB — URINE CULTURE

## 2021-05-18 NOTE — Addendum Note (Signed)
Addended by: Larey Seat on: 05/18/2021 05:47 PM ? ? Modules accepted: Orders ? ?

## 2021-05-18 NOTE — Telephone Encounter (Signed)
Called sonGerald Anderson) back to get further information. Mother went to [facility] ED 05/16/21-05/17/21.  She fell three times last week. Most recent fall this past Sat morning. Called EMS to help w/ this fall. Had imaging completed while at [facility]. Wanted them to f/u with our office after being discharged.  ? ?They did find she was malnourished and dehydrated. She has been more confused per son. I advised both these things could contribute to increased confusion. Son states she moved into independent living last April. It is her responsibility to seek out food/fluids. Recommended they look into assisted living. He is going to discuss this w/ brother to see about going this route for her.  ? ?He is requesting Dr. Brett Fairy review recent hospital visit/test completed and call him to discuss. Aware it may not be today. Will ask she calls sometime this week. He is ok with this plan.  ?

## 2021-05-18 NOTE — Telephone Encounter (Signed)
At 10:29 pt's son left a vm stating pt was in the ED this weekend and had a MRI and has been referred to Radiology.  Pt's son is asking for a call back to discuss what is going on with pt. ?

## 2021-05-18 NOTE — Telephone Encounter (Signed)
Spoke to son Gerald Stabs ; ?Concerned about mother's behavior and confusion. She appeared to be dehydrated and she eats very little. She lives at [place] and doesn't like the food. Son doesn't want her to drive- and she agreed to this. She shuffles. ?She failed a Romberg test, MRI brain was negative.  ?Orthostatic testing needed.  ?EEG needed.  I ordered this test. ? ?Please call son Gerald Stabs and Demetrius Charity for her appointments. ?Larey Seat, MD  ? ? ? ? ?

## 2021-05-19 NOTE — Telephone Encounter (Signed)
Can you please call son to get EEG scheduled? Thank you ?

## 2021-05-19 NOTE — Telephone Encounter (Signed)
LVM for son Gerald Stabs to call back and schedule EEG. ?

## 2021-05-20 ENCOUNTER — Telehealth: Payer: Self-pay

## 2021-05-20 DIAGNOSIS — R41841 Cognitive communication deficit: Secondary | ICD-10-CM | POA: Diagnosis not present

## 2021-05-20 DIAGNOSIS — R490 Dysphonia: Secondary | ICD-10-CM | POA: Diagnosis not present

## 2021-05-20 DIAGNOSIS — R488 Other symbolic dysfunctions: Secondary | ICD-10-CM | POA: Diagnosis not present

## 2021-05-20 NOTE — Telephone Encounter (Signed)
Agree. If these episodes continue would also consider that clonazepam may be contributing to hypotension.

## 2021-05-20 NOTE — Telephone Encounter (Signed)
Called and spoke to pt, pt stated that this morning she checked her BP and it was 60/46. She drank orange juice, Pedialyte and water. Pts BP was 116/68 this afternoon. Pt stated she has not taken her carvedilol or hydralazine this morning. She does take her clonazepam every night. Advised pt not to take her BP medications and if she has anymore issues with her BP to give [place] a call so she can make an appt. ?

## 2021-05-22 DIAGNOSIS — R488 Other symbolic dysfunctions: Secondary | ICD-10-CM | POA: Diagnosis not present

## 2021-05-22 DIAGNOSIS — R490 Dysphonia: Secondary | ICD-10-CM | POA: Diagnosis not present

## 2021-05-22 DIAGNOSIS — R002 Palpitations: Secondary | ICD-10-CM | POA: Diagnosis not present

## 2021-05-22 DIAGNOSIS — R41841 Cognitive communication deficit: Secondary | ICD-10-CM | POA: Diagnosis not present

## 2021-05-24 LAB — VITAMIN E
Vitamin E (Alpha Tocopherol): 11.9 mg/L (ref 9.0–29.0)
Vitamin E(Gamma Tocopherol): 0.6 mg/L (ref 0.5–4.9)

## 2021-05-25 ENCOUNTER — Other Ambulatory Visit: Payer: Self-pay | Admitting: Student

## 2021-05-25 NOTE — Telephone Encounter (Signed)
Called son, Gerald Stabs. Relayed per Dr. Brett Fairy that she received message from Cardiology and would like for her to try and decrease clonazepam back to 0.'25mg'$  po qhs (increased to 0.'5mg'$  05/13/21). He verbalized understanding and will have her try this. Will call back if any questions moving forward.  ?

## 2021-05-25 NOTE — Telephone Encounter (Signed)
Pt called back, Pts BP is still running low. Per [facility] PA, I informed pt to contact the provider who prescribed the clonazepam regarding Bp as the pt has not taken her hydralazine or carvedilol since we last spoke 05/20/2021. Pt voiced understanding.

## 2021-05-25 NOTE — Telephone Encounter (Signed)
Reviewed pt chart. Appears she takes clonazepam for anxiety/parasomnia. Takes 0.'5mg'$  po qhs. Increased at last visit on 05/13/21 from 0.25 to 0.'5mg'$ . Has EEG scheduled for 06/03/21. Will speak w/ MD to see how she would like to proceed. ?

## 2021-05-25 NOTE — Telephone Encounter (Signed)
Nurse called from Dr. Irven Shelling (cardiology) office stating that pt has had multiple low blood pressures and believes it could be due to the clonazepam. Would like to know if pt can be taken off this medication. Would like a call back at [phone] or for [place] to reach out to pt at [phone]. ?

## 2021-05-25 NOTE — Telephone Encounter (Signed)
Pt calling again regarding BP issues. Please call to discuss. //ah ?

## 2021-05-25 NOTE — Progress Notes (Signed)
Stopped carvedilol and hydralazine due to soft blood pressures.  ?

## 2021-05-26 DIAGNOSIS — R488 Other symbolic dysfunctions: Secondary | ICD-10-CM | POA: Diagnosis not present

## 2021-05-26 DIAGNOSIS — R490 Dysphonia: Secondary | ICD-10-CM | POA: Diagnosis not present

## 2021-05-26 DIAGNOSIS — R41841 Cognitive communication deficit: Secondary | ICD-10-CM | POA: Diagnosis not present

## 2021-05-26 NOTE — Telephone Encounter (Signed)
Pt states her Cardiologist wants to know if there is something pt can take to replace the reduction in clonazepam, please call. ?

## 2021-05-26 NOTE — Telephone Encounter (Signed)
Called the pt back. There was no answer. LVM asking for the pt to call back.  ? ?If pt calls back, Per Dr Edwena Felty recommendation and conversation Gloria Anderson spoke with the son, Dr Brett Fairy just wanted to go back to the low dose clonazepam 0.25 mg at bedtime. She does not recommend adding an additional medication. We would just continue monitor if there are any other symptoms of REM BD.  ? ?

## 2021-05-26 NOTE — Telephone Encounter (Signed)
Spoke with the pt; relayed message. Pt verbalized understand. ?

## 2021-05-28 DIAGNOSIS — R002 Palpitations: Secondary | ICD-10-CM | POA: Diagnosis not present

## 2021-05-29 NOTE — Progress Notes (Signed)
Zcalled pt to inform her about her monitor results. Pt understood, transferred to the front to make a appt with you

## 2021-06-02 DIAGNOSIS — E039 Hypothyroidism, unspecified: Secondary | ICD-10-CM | POA: Diagnosis not present

## 2021-06-02 DIAGNOSIS — I951 Orthostatic hypotension: Secondary | ICD-10-CM | POA: Diagnosis not present

## 2021-06-02 DIAGNOSIS — R479 Unspecified speech disturbances: Secondary | ICD-10-CM | POA: Diagnosis not present

## 2021-06-02 DIAGNOSIS — I679 Cerebrovascular disease, unspecified: Secondary | ICD-10-CM | POA: Diagnosis not present

## 2021-06-02 DIAGNOSIS — I129 Hypertensive chronic kidney disease with stage 1 through stage 4 chronic kidney disease, or unspecified chronic kidney disease: Secondary | ICD-10-CM | POA: Diagnosis not present

## 2021-06-02 DIAGNOSIS — M810 Age-related osteoporosis without current pathological fracture: Secondary | ICD-10-CM | POA: Diagnosis not present

## 2021-06-02 DIAGNOSIS — N1832 Chronic kidney disease, stage 3b: Secondary | ICD-10-CM | POA: Diagnosis not present

## 2021-06-02 DIAGNOSIS — M5416 Radiculopathy, lumbar region: Secondary | ICD-10-CM | POA: Diagnosis not present

## 2021-06-02 DIAGNOSIS — I10 Essential (primary) hypertension: Secondary | ICD-10-CM | POA: Diagnosis not present

## 2021-06-02 DIAGNOSIS — R7302 Impaired glucose tolerance (oral): Secondary | ICD-10-CM | POA: Diagnosis not present

## 2021-06-02 DIAGNOSIS — D649 Anemia, unspecified: Secondary | ICD-10-CM | POA: Diagnosis not present

## 2021-06-02 DIAGNOSIS — I700 Atherosclerosis of aorta: Secondary | ICD-10-CM | POA: Diagnosis not present

## 2021-06-03 ENCOUNTER — Ambulatory Visit: Payer: Medicare HMO | Admitting: Neurology

## 2021-06-03 DIAGNOSIS — R419 Unspecified symptoms and signs involving cognitive functions and awareness: Secondary | ICD-10-CM

## 2021-06-03 DIAGNOSIS — R410 Disorientation, unspecified: Secondary | ICD-10-CM | POA: Diagnosis not present

## 2021-06-03 DIAGNOSIS — F514 Sleep terrors [night terrors]: Secondary | ICD-10-CM

## 2021-06-03 DIAGNOSIS — Q283 Other malformations of cerebral vessels: Secondary | ICD-10-CM

## 2021-06-03 DIAGNOSIS — R208 Other disturbances of skin sensation: Secondary | ICD-10-CM

## 2021-06-04 DIAGNOSIS — R41841 Cognitive communication deficit: Secondary | ICD-10-CM | POA: Diagnosis not present

## 2021-06-04 DIAGNOSIS — R488 Other symbolic dysfunctions: Secondary | ICD-10-CM | POA: Diagnosis not present

## 2021-06-04 DIAGNOSIS — R490 Dysphonia: Secondary | ICD-10-CM | POA: Diagnosis not present

## 2021-06-05 DIAGNOSIS — R41841 Cognitive communication deficit: Secondary | ICD-10-CM | POA: Diagnosis not present

## 2021-06-05 DIAGNOSIS — R490 Dysphonia: Secondary | ICD-10-CM | POA: Diagnosis not present

## 2021-06-05 DIAGNOSIS — R488 Other symbolic dysfunctions: Secondary | ICD-10-CM | POA: Diagnosis not present

## 2021-06-08 NOTE — Procedures (Signed)
? ? ?  History: ? ?88 year old woman with increase confusion  ? ?EEG classification: Awake and drowsy ? ?Description of the recording: The background rhythms of this recording consists of a fairly well modulated medium amplitude alpha rhythm of 7-8 Hz that is reactive to eye opening and closure. As the record progresses, the patient appears to remain in the waking state throughout the recording. Photic stimulation was performed, did not show any abnormalities. Hyperventilation was also performed, did not show any abnormalities. Toward the end of the recording, the patient enters the drowsy state with slight symmetric slowing seen. The patient never enters stage II sleep. No abnormal epileptiform discharges seen during this recording. There was no focal slowing. EKG monitor shows no evidence of cardiac rhythm abnormalities with a heart rate of 90. ? ?Abnormality: None  ? ?Impression: This is an abnormal EEG recording in the waking and drowsy state due to mild diffuse slowing. Diffuse slowing is nonspecific but consistent with a generalized brain dysfunction.   ? ? ?Alric Ran, MD ?[facility] ?  ?

## 2021-06-09 ENCOUNTER — Telehealth: Payer: Self-pay

## 2021-06-09 DIAGNOSIS — R488 Other symbolic dysfunctions: Secondary | ICD-10-CM | POA: Diagnosis not present

## 2021-06-09 DIAGNOSIS — R41841 Cognitive communication deficit: Secondary | ICD-10-CM | POA: Diagnosis not present

## 2021-06-09 DIAGNOSIS — R490 Dysphonia: Secondary | ICD-10-CM | POA: Diagnosis not present

## 2021-06-09 NOTE — Progress Notes (Signed)
Impression: This is an abnormal EEG recording in the waking and drowsy state due to mild diffuse slowing. Diffuse slowing is nonspecific but consistent with a generalized brain dysfunction.   ?Slowing can be seen in encephalopathy.  ?

## 2021-06-09 NOTE — Telephone Encounter (Signed)
I called patient. I dicussed her EEG results. I offered patient a sooner appointment with Dr. Brett Fairy to discuss further but patient declined. Pt verbalized understanding of results. Pt had no questions at this time but was encouraged to call back if questions arise. ? ?

## 2021-06-09 NOTE — Telephone Encounter (Signed)
-----   Message from Larey Seat, MD sent at 06/09/2021  1:25 PM EDT ----- ?Impression: This is an abnormal EEG recording in the waking and drowsy state due to mild diffuse slowing. Diffuse slowing is nonspecific but consistent with a generalized brain dysfunction.   ?Slowing can be seen in encephalopathy.  ? ?

## 2021-06-10 DIAGNOSIS — R488 Other symbolic dysfunctions: Secondary | ICD-10-CM | POA: Diagnosis not present

## 2021-06-10 DIAGNOSIS — R41841 Cognitive communication deficit: Secondary | ICD-10-CM | POA: Diagnosis not present

## 2021-06-10 DIAGNOSIS — R490 Dysphonia: Secondary | ICD-10-CM | POA: Diagnosis not present

## 2021-06-11 DIAGNOSIS — R41841 Cognitive communication deficit: Secondary | ICD-10-CM | POA: Diagnosis not present

## 2021-06-11 DIAGNOSIS — R488 Other symbolic dysfunctions: Secondary | ICD-10-CM | POA: Diagnosis not present

## 2021-06-11 DIAGNOSIS — R490 Dysphonia: Secondary | ICD-10-CM | POA: Diagnosis not present

## 2021-06-16 DIAGNOSIS — R41841 Cognitive communication deficit: Secondary | ICD-10-CM | POA: Diagnosis not present

## 2021-06-16 DIAGNOSIS — R490 Dysphonia: Secondary | ICD-10-CM | POA: Diagnosis not present

## 2021-06-16 DIAGNOSIS — R488 Other symbolic dysfunctions: Secondary | ICD-10-CM | POA: Diagnosis not present

## 2021-06-16 NOTE — Telephone Encounter (Signed)
Pt would like RN to call her back to discuss further her EEG results.  ?

## 2021-06-16 NOTE — Telephone Encounter (Signed)
Called pt back. She would like to schedule an appointment to go over EEG results with Dr. Brett Fairy. Scheduled appt for 06/17/21 at 3:00 with Dr. Brett Fairy. Asked that she check in at 230/245pm. She verbalized understanding.  ?

## 2021-06-17 ENCOUNTER — Ambulatory Visit: Payer: Medicare HMO | Admitting: Neurology

## 2021-06-17 ENCOUNTER — Encounter: Payer: Self-pay | Admitting: Neurology

## 2021-06-17 VITALS — BP 143/86 | HR 109 | Ht 64.0 in | Wt 112.0 lb

## 2021-06-17 DIAGNOSIS — E860 Dehydration: Secondary | ICD-10-CM | POA: Diagnosis not present

## 2021-06-17 DIAGNOSIS — R296 Repeated falls: Secondary | ICD-10-CM

## 2021-06-17 DIAGNOSIS — I951 Orthostatic hypotension: Secondary | ICD-10-CM | POA: Diagnosis not present

## 2021-06-17 DIAGNOSIS — G3184 Mild cognitive impairment, so stated: Secondary | ICD-10-CM | POA: Diagnosis not present

## 2021-06-17 NOTE — Patient Instructions (Signed)
Orthostatic Hypotension Blood pressure is a measurement of how strongly, or weakly, your circulating blood is pressing against the walls of your arteries. Orthostatic hypotension is a drop in blood pressure that can happen when you change positions, such as when you go from lying down to standing. Arteries are blood vessels that carry blood from your heart throughout your body. When blood pressure is too low, you may not get enough blood to your brain or to the rest of your organs. Orthostatic hypotension can cause light-headedness, sweating, rapid heartbeat, blurred vision, and fainting. These symptoms require further investigation into the cause. What are the causes? Orthostatic hypotension can be caused by many things, including: Sudden changes in posture, such as standing up quickly after you have been sitting or lying down. Loss of blood (anemia) or loss of body fluids (dehydration). Heart problems, neurologic problems, or hormone problems. Pregnancy. Aging. The risk for this condition increases as you get older. Severe infection (sepsis). Certain medicines, such as medicines for high blood pressure or medicines that make the body lose excess fluids (diuretics). What are the signs or symptoms? Symptoms of this condition may include: Weakness, light-headedness, or dizziness. Sweating. Blurred vision. Tiredness (fatigue). Rapid heartbeat. Fainting, in severe cases. How is this diagnosed? This condition is diagnosed based on: Your symptoms and medical history. Your blood pressure measurements. Your health care provider will check your blood pressure when you are: Lying down. Sitting. Standing. A blood pressure reading is recorded as two numbers, such as "120 over 80" (or 120/80). The first ("top") number is called the systolic pressure. It is a measure of the pressure in your arteries as your heart beats. The second ("bottom") number is called the diastolic pressure. It is a measure of  the pressure in your arteries when your heart relaxes between beats. Blood pressure is measured in a unit called mmHg. Healthy blood pressure for most adults is 120/80 mmHg. Orthostatic hypotension is defined as a 20 mmHg drop in systolic pressure or a 10 mmHg drop in diastolic pressure within 3 minutes of standing. Other information or tests that may be used to diagnose orthostatic hypotension include: Your other vital signs, such as your heart rate and temperature. Blood tests. An electrocardiogram (ECG) or echocardiogram. A Holter monitor. This is a device you wear that records your heart rhythm continuously, usually for 24-48 hours. Tilt table test. For this test, you will be safely secured to a table that moves you from a lying position to an upright position. Your heart rhythm and blood pressure will be monitored during the test. How is this treated? This condition may be treated by: Changing your diet. This may involve eating more salt (sodium) or drinking more water. Changing the dosage of certain medicines you are taking that might be lowering your blood pressure. Correcting the underlying reason for the orthostatic hypotension. Wearing compression stockings. Taking medicines to raise your blood pressure. Avoiding actions that trigger symptoms. Follow these instructions at home: Medicines Take over-the-counter and prescription medicines only as told by your health care provider. Follow instructions from your health care provider about changing the dosage of your current medicines, if this applies. Do not stop or adjust any of your medicines on your own. Eating and drinking  Drink enough fluid to keep your urine pale yellow. Eat extra salt only as directed. Do not add extra salt to your diet unless advised by your health care provider. Eat frequent, small meals. Avoid standing up suddenly after eating. General instructions    Get up slowly from lying down or sitting positions. This  gives your blood pressure a chance to adjust. Avoid hot showers and excessive heat as directed by your health care provider. Engage in regular physical activity as directed by your health care provider. If you have compression stockings, wear them as told. Keep all follow-up visits. This is important. Contact a health care provider if: You have a fever for more than 2-3 days. You feel more thirsty than usual. You feel dizzy or weak. Get help right away if: You have chest pain. You have a fast or irregular heartbeat. You become sweaty or feel light-headed. You feel short of breath. You faint. You have any symptoms of a stroke. "BE FAST" is an easy way to remember the main warning signs of a stroke: B - Balance. Signs are dizziness, sudden trouble walking, or loss of balance. E - Eyes. Signs are trouble seeing or a sudden change in vision. F - Face. Signs are sudden weakness or numbness of the face, or the face or eyelid drooping on one side. A - Arms. Signs are weakness or numbness in an arm. This happens suddenly and usually on one side of the body. S - Speech. Signs are sudden trouble speaking, slurred speech, or trouble understanding what people say. T - Time. Time to call emergency services. Write down what time symptoms started. You have other signs of a stroke, such as: A sudden, severe headache with no known cause. Nausea or vomiting. Seizure. These symptoms may represent a serious problem that is an emergency. Do not wait to see if the symptoms will go away. Get medical help right away. Call your local emergency services (911 in the [place] Do not drive yourself to the hospital. Summary Orthostatic hypotension is a sudden drop in blood pressure. It can cause light-headedness, sweating, rapid heartbeat, blurred vision, and fainting. Orthostatic hypotension can be diagnosed by having your blood pressure taken while lying down, sitting, and then standing. Treatment may involve  changing your diet, wearing compression stockings, sitting up slowly, adjusting your medicines, or correcting the underlying reason for the orthostatic hypotension. Get help right away if you have chest pain, a fast or irregular heartbeat, or symptoms of a stroke. This information is not intended to replace advice given to you by your health care provider. Make sure you discuss any questions you have with your health care provider. Document Revised: 05/01/2020 Document Reviewed: 05/01/2020 Elsevier Patient Education  2023 [employer]  

## 2021-06-17 NOTE — Progress Notes (Signed)
?[facility] ? ?Provider:  Larey Seat, MD  ? ? ?Referring Provider: [name], MD ?Primary Care Physician:  [name], MD ? ?Chief Complaint  ?Patient presents with  ? Follow-up  ?  Rm 10, alone. Here to discuss recent EEG results. She notes dizziness when she gets up  and sometimes her balance is off. and she notes slight headache. ? Residual back pain from a surgery years ago.   ? ? ?HPI:   ?This patient of Dr. 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 She tolerates Klonopin and is happy with the REM sleep control. She has developed higher blood pressures spurs of higher blood pressures that Dr. Forde Dandy and Dr. Einar Gip have followed, and initiated treatment- she developed orthostatic BP!Marland Kitchen She was found to be anemic, sleepy and fatigued. Has normal B 12 and started oral iron. She has a history of PMR. Polymyalgia rheumatica. She had a normal colonoscopy in 2016. Had lost weight , but recovered . Mrs. Petzold brought her last laboratory results with her, white blood cell count was 3.74K, red blood cell count was 2.9, hemoglobin 9.8 g, hematocrit 28.4%, corpuscular volume 97.1 MCH 33.5. ?She was over the phone prescribed Nu-iron to take as a supplement. Needs TIBC, Total iron deficiency,  ? ?09-26-13- The patient's sleep study from 08-05-13 revealed no apnea, no oxygen desaturation, no irregular heart beats, and only took clusters of limb movements that seem to be related to discomfort with sleeping on the left side. She had no periodic limb movements during REM sleep. There was a prolonged period of slow-wave sleep at around 2 AM during which nor night terrors sleep walking or sleep talking was ordered. ?The patient reported  that she had another spell- a feeling as if something brushes up on her scalp, or behind her head.  ?Since the patient has a  long-standing bruit that she can hear at night and that bothers her( tinnitus) I have also suggested to do an MRA of the brain , as I am concerned that they're tortuous vessels , which  may be affecting the left frontal lobe perfusion. This could be a trigger for a nocturnal event as described by the patient clinically. ?Her MRi documented to my surprise exactly that ! An intracerebral  Venous- cavernous malformation. We are now meeting on 09-26-13 to discuss the results and documented the findings. She had more Night terrors the last weeks. The gabapentin gave her diplopia. I suggest now Topiramate, but she reminded me of involuntary weight loss and loss of apetite. I will try a very low dose of KLONOPIN.  The patient should watch her BP and avoid straining , pressure inducing activities.  ? ?RV 02-27-14  ?Discussed EEG results, no epileptiform activity , but frontal sharps noted. These are of unclear significance.  ?See report. No change in meds.needed  ?Patient is free to travel to [place] in April.  ?The patient,  a caucasian right handed female , originally from [place], reports vivid dreams, with fearful frightful content.  ?Her dreams have let her to act out some of the dream content, she wakes with her arms extended and with palpitations, tachycardia form fright. She kicks and fell out of bed, yelling, calling out . Following  one dream last year,  she injured her face in a fall . She begun covering all sharp edges.  She has polymyalgia rheumatica and  was treated with prednisone , gaining weight through this time ?She rises at 8.30, the alarm wakes her at 8.  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 No childhood sleep disorder history. Usually a 7 -8 hour sleeper , refreshed.  ? ? ?Soc history : The patient is widowed for 12 years, has adult children and grandchildren.The bruit in her left temple keeps her form sleeping on the side. She has 3 carotid dopplers over 25 years, diagnosed with  tortious vessel.  ?MRI brain was  normal 4 year ago ( Dr. Carloyn Manner)  ?No MRA was done at the time , I like to rule out a vascular abnormality at the frontal lobe. ? ?08-04-2015 ?Her night terrors have responded to the medication, and her spells are rare now. She returned from [place] and had one event which "scared her sister to death "  ?I have spoken to Dr. Delice Lesch about the possible component of cavernous vascular malformations producing a seizure event. I ordered a 48 hour study but the patient underwent a 72 hour study. This was normal- no seizure activity,  ?She is doing well on Klonopin. She gained weight on klonopin, (a fact she likes!) ?She reports vertigo, dizzines with rapid head movementns and bending down, also controlled with Klonopin.  Her eye feel as if pulsating - can this be the AVM? HTN was addressed in the last 2 visits with endocrinology / PCP.  ?She joked today that her weight has been stabile for so long, she had no excuse for a new wardrobe !  ?She had no spells for the last 4 month. She needs no refills today. ? ?Interval history from 02/03/2016. [name] reports today that after being almost free of vivid dreams for several month she has returned having some not to the same intensity and not the same frequency as prior to treatment. Some of them are scary they have the character of the night may or or night terror, and she responds was higher heart rate feeling under stress. Klonopin has been working very well at a very low dose for her by now for over 2 years. I would like to continue using Klonopin with the option that if the medication wears off she could increase the dose. She also does not have insomnia when taking Klonopin. She is not fatigued and she is not excessively daytime sleepy, she also does not report that she is groggy in the mornings. She feels stable well-balanced and not at a risk of falling. I will refill her medication today. I also congratulated her to her 82th birthday, which she celebrates in style  this coming Saturday. ? ?Travel history from 15 February 2017, [name] has traveled to summer to [place] with some difficulties.  She had no medical problems however.  She brought me her Klonopin prescription but she had not needed to fill and which is now a year old.  I will refill her Klonopin through [facility] today. She has no complain of grogginess or mental cloudiness in the morning. She has night terrors. She has noted that she is not easily scared , not panicked.  ? ?07-26-2017, ?Brittan Butterbaugh is a 88 y.o. female patient for follow up on concerns of cognitive function, memory.  She seems well, is alert and happy. She has a full social life.  ?She is relieved that the [facility] was normal 26-30 points. Balance is reportedly poor, she is careful.  She continues to report hip pain, and has some night spells- but  much better than before Klonopin was initiated.  ?Was seen by Dr. Einar Gip cardiology for BP variability- he asked  Her to start wearing compression stockings and elevate her feet at night. She has not always hydrated well, and she is struggling with HTN and orthostatic lightheadedness.  ? ?Today 10/05/17: ? ?Ms. Fanning is an 88 year old female with a history of memory disturbance and night terrors.  She returns today to discuss her balance.  She states that she went to see her primary care after she had an episode while ambulating.  She reports that she was walking with a neighbor.  15 minutes in to her walk she lost sensation in the lower part of her back and in her ankles.  She reports that she felt like she had no control over her balance.  She states that she had to sit down.  She reports after that event she had some mild episodes but since then this has resolved and she has not had any trouble walking.  She states that she went for a walk last night and did not have any symptoms.  She states that she has found if she uses a cane this offers her more stability.  In the past CT of the  lumbar spine as well as MRI of the lumbar spine shows stenosis.  She denies any changes with her bowels or bladder.  Denies any numbness or tingling in the lower extremities.  She returns today for an eval

## 2021-06-22 ENCOUNTER — Encounter: Payer: Self-pay | Admitting: Student

## 2021-06-22 ENCOUNTER — Ambulatory Visit: Payer: Medicare HMO | Admitting: Student

## 2021-06-22 VITALS — BP 158/72 | HR 83 | Temp 98.6°F | Resp 17 | Ht 64.0 in | Wt 114.0 lb

## 2021-06-22 DIAGNOSIS — I951 Orthostatic hypotension: Secondary | ICD-10-CM | POA: Diagnosis not present

## 2021-06-22 DIAGNOSIS — R002 Palpitations: Secondary | ICD-10-CM

## 2021-06-22 NOTE — Progress Notes (Signed)
? ?Primary Physician/Referring:  [name], MD ? ?Patient ID: Gloria Anderson, female    DOB: November 30, 1933, 88 y.o.   MRN: [id number] ? ?Chief Complaint  ?Patient presents with  ?? Follow-up  ?? Results  ? ?HPI:   ? ?Gloria Anderson  is a 88 y.o. 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  She is extremely sensitive to blood pressure variations.  She is also enrolled in   in Remote Patient Monitoring and Principal Care Management as patient is high risk for hospitalization and complications from underlying medical conditions.  ? ?Patient presents for follow-up to review results of cardiac monitor.  Patient was last seen in our office 05/12/2021 at which time blood pressure was fairly well controlled, therefore no changes were made.  However given concerns of palpitations and fatigue ordered a 1 week cardiac monitor which revealed symptomatic episodes of SVT and atrial tachycardia with the longest lasting 4 hours and 45 minutes. Upon further questioning patient states she has had no recent palpitations. While wearing the monitor patient experienced neck pain which has continued since, which is want prompted her to push the monitor trigger. She continues to take Coreg 3.15 mg as needed for BP >683 mmHg systolic.  ? ?She does sleep on a wedge at night due to orthostatic hypertension.  ? ?Past Medical History:  ?Diagnosis Date  ?? Arthritis   ?? Complication of anesthesia   ?? Constipation   ?? Depression   ?? GERD (gastroesophageal reflux disease)   ?? Hypertension   ?? Hypothyroidism   ?? Osteoporosis   ?? Pneumonia 2018  ?? Polymyalgia ([place])   ?? Polymyalgia rheumatica ([place])   ?? PONV (postoperative nausea and vomiting)   ? ?Past Surgical History:  ?Procedure Laterality Date  ?? BASAL CELL CARCINOMA EXCISION  2022  ? on face  ?? COLONOSCOPY W/ POLYPECTOMY    ?? EYE SURGERY     ? both cataracts  ?? LUMBAR LAMINECTOMY/DECOMPRESSION MICRODISCECTOMY N/A 08/15/2018  ? Procedure: Lumbar three to Lumbar five Decompressive lumbar laminectomy;  Surgeon: Erline Levine, MD;  Location: [place];  Service: Neurosurgery;  Laterality: N/A;  ?? NASAL SINUS SURGERY    ?? SHOULDER SURGERY Right 2007  ? repair  ?? TONSILLECTOMY    ?? TRIGGER FINGER RELEASE  03/19/2011  ? Procedure:  left middle finger RELEASE TRIGGER FINGER/A-1 PULLEY;  Surgeon: Cammie Sickle., MD;  Location: [facility] [place] [facility];  Service: Orthopedics;  Laterality: Right;  Procedure:  Release Right Long and Ring Trigger Fingers, Release Left Long Trigger Finger, Injection Left Long Proximal Phalangeal Joint  ?? TRIGGER FINGER RELEASE Right   ? Ring finger, middle finger  ?? UPPER GASTROINTESTINAL ENDOSCOPY    ? ?Social History  ? ?Tobacco Use  ?? Smoking status: Never  ?? Smokeless tobacco: Never  ?Substance Use Topics  ?? Alcohol use: No  ?  Alcohol/week: 0.0 standard drinks  ? ?ROS  ?Review of Systems  ?Cardiovascular:  Negative for chest pain, dyspnea on exertion, leg swelling and palpitations.  ?Gastrointestinal:  Negative for melena.  ?Neurological:  Positive for dizziness (occassional).  ?Psychiatric/Behavioral:  The patient is nervous/anxious (about health).   ? ?Objective  ?Blood pressure (!) 158/72, pulse 83, temperature 98.6 ?F (37 ?C), temperature source Temporal, resp. rate 17, height '5\' 4"'$  (1.626 m), weight 114 lb (51.7 kg), SpO2 98 %.  ? ?  06/22/2021  ?  2:27 PM  06/17/2021  ?  3:25 PM 05/17/2021  ?  4:30 AM  ?Vitals with BMI  ?Height '5\' 4"'$  '5\' 4"'$    ?Weight 114 lbs 112 lbs   ?BMI 19.56 19.22   ?Systolic 742 595 638  ?Diastolic 72 86 83  ?Pulse 83 109 100  ?  ?Orthostatic VS for the past 72 hrs (Last 3 readings): ? Patient Position BP Location Cuff Size  ?06/22/21 1427 Sitting Left Arm Normal  ?  ?Physical Exam ?Vitals reviewed.  ?Constitutional:   ?   Appearance: She is well-developed.  ?Cardiovascular:  ?   Rate and  Rhythm: Regular rhythm. Tachycardia present.  ?   Pulses: Intact distal pulses.     ?     Carotid pulses are  on the right side with bruit and  on the left side with bruit. ?     Femoral pulses are  on the right side with bruit and  on the left side with bruit. ?   Heart sounds: Murmur heard.  ?Crescendo-decrescendo mid to late systolic murmur is present with a grade of 2/6 at the apex.  ?Pulmonary:  ?   Effort: Pulmonary effort is normal. No accessory muscle usage or respiratory distress.  ?   Breath sounds: Normal breath sounds.  ?Abdominal:  ?   General: There is abdominal bruit.  ?Neurological:  ?   Mental Status: She is alert.  ? ?Laboratory examination:  ? ?Recent Labs  ?  08/20/20 ?1544 01/28/21 ?1504 05/16/21 ?1817  ?NA 138 142 138  ?K 4.7 4.6 4.3  ?CL 100 105 101  ?CO2 '28 29 27  '$ ?GLUCOSE 128* 87 111*  ?BUN 22 30* 34*  ?CREATININE 1.21* 1.32* 1.67*  ?CALCIUM 9.6 9.9 10.5*  ?GFRNONAA 41*  --  30*  ?GFRAA 48*  --   --   ? ?CrCl cannot be calculated (Patient's most recent lab result is older than the maximum 21 days allowed.).  ? ?  Latest Ref Rng & Units 05/16/2021  ?  6:17 PM 01/28/2021  ?  3:04 PM 08/20/2020  ?  3:44 PM  ?CMP  ?Glucose 70 - 99 mg/dL 111   87   128    ?BUN 8 - 23 mg/dL 34   30   22    ?Creatinine 0.44 - 1.00 mg/dL 1.67   1.32   1.21    ?Sodium 135 - 145 mmol/L 138   142   138    ?Potassium 3.5 - 5.1 mmol/L 4.3   4.6   4.7    ?Chloride 98 - 111 mmol/L 101   105   100    ?CO2 22 - 32 mmol/L '27   29   28    '$ ?Calcium 8.9 - 10.3 mg/dL 10.5   9.9   9.6    ?Total Protein 6.5 - 8.1 g/dL 6.8   6.9   6.8    ?Total Bilirubin 0.3 - 1.2 mg/dL 1.6   1.1   1.0    ?Alkaline Phos 38 - 126 U/L 43      ?AST 15 - 41 U/L 37   31   29    ?ALT 0 - 44 U/L '24   25   21    '$ ? ? ?  Latest Ref Rng & Units 05/16/2021  ?  6:17 PM 01/28/2021  ?  3:04 PM 08/20/2020  ?  3:44 PM  ?CBC  ?WBC 4.0 - 10.5 K/uL 7.2   4.9   5.2    ?  Hemoglobin 12.0 - 15.0 g/dL 11.7   10.7   11.1    ?Hematocrit 36.0 - 46.0 % 32.5   31.5   33.2     ?Platelets 150 - 400 K/uL 171   163   184    ? ?External labs: ?Cholesterol, total 162.000 m 05/26/2020 ?HDL 79.000 mg 05/26/2020 ?LDL 74.000 mg 05/26/2020 ?Triglycerides 43.000 mg 05/26/2020 ? ?A1C 4.900 % 05/26/2020 ? ?Allergies  ?No Known Allergies  ? ?Medications Prior to Visit:  ? ?Outpatient Medications Prior to Visit  ?Medication Sig Dispense Refill  ?? acetaminophen (TYLENOL) 325 MG tablet Take 325 mg by mouth every 6 (six) hours as needed for moderate pain or headache.    ?? Apoaequorin (PREVAGEN) 10 MG CAPS Take 10 mg by mouth daily after breakfast. 30 capsule 0  ?? atorvastatin (LIPITOR) 10 MG tablet Take 10 mg by mouth every evening.    ?? BIOTIN FORTE PO Take 1 capsule by mouth daily.    ?? Calcium Citrate (CITRACAL PO) Take 2 tablets by mouth in the morning and at bedtime.    ?? carvedilol (COREG) 3.125 MG tablet Take 3.125 mg by mouth 2 (two) times daily as needed.    ?? clonazePAM (KLONOPIN) 0.5 MG tablet Take 1 tablet (0.5 mg total) by mouth at bedtime. 90 tablet 0  ?? denosumab (PROLIA) 60 MG/ML SOSY injection Inject 60 mg into the skin every 6 (six) months.    ?? fish oil-omega-3 fatty acids 1000 MG capsule Take 1 g by mouth daily.     ?? hydroxychloroquine (PLAQUENIL) 200 MG tablet Take 1 tablet (200 mg total) by mouth daily. 90 tablet 0  ?? levothyroxine (SYNTHROID, LEVOTHROID) 50 MCG tablet Take 50 mcg by mouth daily before breakfast.     ?? Lifitegrast (XIIDRA OP) Apply 1 drop to eye 2 (two) times daily.    ?? Multiple Vitamin (MULTIVITAMIN PO) Take 1 tablet by mouth daily.    ?? omeprazole (PRILOSEC) 20 MG capsule Take 20 mg by mouth daily.    ?? OVER THE COUNTER MEDICATION Take 2 tablets by mouth in the morning and at bedtime. Calcium mini    ?? polyethylene glycol (MIRALAX / GLYCOLAX) packet Take 17 g by mouth daily as needed for moderate constipation.     ?? Probiotic Product (PROBIOTIC DAILY PO) Take 1 tablet by mouth daily. Culturelle    ?? Propylene Glycol (SYSTANE BALANCE OP) Apply 1 drop  to eye as needed.    ?? Garlic (GARLIQUE PO) Take 1 capsule by mouth daily.    ?? hydrALAZINE (APRESOLINE) 25 MG tablet Take 25 mg by mouth as needed.    ?? NONFORMULARY OR COMPOUNDED ITEM 100 ml of liquid : Ami

## 2021-06-23 DIAGNOSIS — I10 Essential (primary) hypertension: Secondary | ICD-10-CM | POA: Diagnosis not present

## 2021-06-23 DIAGNOSIS — M810 Age-related osteoporosis without current pathological fracture: Secondary | ICD-10-CM | POA: Diagnosis not present

## 2021-06-23 DIAGNOSIS — E785 Hyperlipidemia, unspecified: Secondary | ICD-10-CM | POA: Diagnosis not present

## 2021-06-23 DIAGNOSIS — I129 Hypertensive chronic kidney disease with stage 1 through stage 4 chronic kidney disease, or unspecified chronic kidney disease: Secondary | ICD-10-CM | POA: Diagnosis not present

## 2021-06-23 DIAGNOSIS — N1832 Chronic kidney disease, stage 3b: Secondary | ICD-10-CM | POA: Diagnosis not present

## 2021-06-23 DIAGNOSIS — Z79899 Other long term (current) drug therapy: Secondary | ICD-10-CM | POA: Diagnosis not present

## 2021-06-29 DIAGNOSIS — I129 Hypertensive chronic kidney disease with stage 1 through stage 4 chronic kidney disease, or unspecified chronic kidney disease: Secondary | ICD-10-CM | POA: Diagnosis not present

## 2021-06-29 DIAGNOSIS — K219 Gastro-esophageal reflux disease without esophagitis: Secondary | ICD-10-CM | POA: Diagnosis not present

## 2021-06-29 DIAGNOSIS — N1832 Chronic kidney disease, stage 3b: Secondary | ICD-10-CM | POA: Diagnosis not present

## 2021-06-29 DIAGNOSIS — N281 Cyst of kidney, acquired: Secondary | ICD-10-CM | POA: Diagnosis not present

## 2021-06-29 DIAGNOSIS — M353 Polymyalgia rheumatica: Secondary | ICD-10-CM | POA: Diagnosis not present

## 2021-06-30 ENCOUNTER — Ambulatory Visit: Payer: Medicare HMO | Admitting: Rheumatology

## 2021-07-02 NOTE — Progress Notes (Signed)
Office Visit Note  Patient: Gloria Anderson             Date of Birth: 11-07-1933           MRN: [id number]             PCP: [name], MD Referring: [name], MD Visit Date: 07/16/2021 Occupation: '@GUAROCC' @  Subjective:  Poor balance  History of Present Illness: Gloria Anderson is a 88 y.o. female with history of polymyalgia rheumatica, osteoarthritis and osteoporosis.  She denies any increased muscular weakness or tenderness.  She has been walking on a regular basis.  She also participates in the physical therapy sessions and tai chi at the independent living center.  She has been seeing Dr. Brett Fairy for the night terrors for many years.  Recently she had 2 episodes of sleepwalking in the last year.  The last episode was last Sunday.  She states she bumped her head into furniture and had some bruising and a small cut.  She is also seeing Dr. Royce Macadamia, nephrologist.  She states her creatinine was better this time.  She had labs last week.  She is concerned about her poor balance.  Activities of Daily Living:  Patient reports morning stiffness for 0 minutes.   Patient Denies nocturnal pain.  Difficulty dressing/grooming: Denies Difficulty climbing stairs: Reports Difficulty getting out of chair: Reports Difficulty using hands for taps, buttons, cutlery, and/or writing: Reports  Review of Systems  Constitutional:  Positive for fatigue.  HENT:  Positive for mouth dryness. Negative for mouth sores and nose dryness.   Eyes:  Positive for dryness. Negative for pain and itching.  Respiratory:  Negative for shortness of breath and difficulty breathing.   Cardiovascular:  Negative for chest pain and palpitations.  Gastrointestinal:  Positive for constipation. Negative for blood in stool and diarrhea.  Endocrine: Negative for increased urination.  Genitourinary:  Negative for difficulty urinating.  Musculoskeletal:  Positive for myalgias, muscle tenderness and myalgias.  Negative for joint pain, joint pain, joint swelling and morning stiffness.  Skin:  Negative for color change, rash and redness.  Allergic/Immunologic: Negative for susceptible to infections.  Neurological:  Positive for dizziness, headaches and memory loss. Negative for numbness and weakness.  Hematological:  Negative for bruising/bleeding tendency.  Psychiatric/Behavioral:  Negative for confusion.    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[place]) 05/23/2019   Nocturnal seizures ([place]) 05/23/2019   Adult night terrors 01/09/2019   Degenerative lumbar spinal stenosis 08/15/2018   Memory difficulty 07/26/2017   Other parasomnia 07/26/2017   Anemia 06/02/2017   Osteopenia 06/02/2017   Spinal stenosis 06/02/2017   Iron deficiency anemia secondary to inadequate dietary iron intake 08/05/2016   Fatigue associated with anemia 08/05/2016   Primary osteoarthritis of both feet 02/04/2016   Essential hypertension 02/04/2016   PMR (polymyalgia rheumatica) ([place]) 02/03/2016   Osteoarthritis, hand 02/03/2016   Age-related osteoporosis without current pathological fracture 02/03/2016   DDD (degenerative disc disease), lumbar 02/03/2016   High risk medication use 02/03/2016   Parasomnia, organic 08/04/2015   Parasomnia due to medical condition 02/03/2015   Cerebral cavernous malformation type 1 12/04/2013  Night terrors, adult 09/26/2013   HEMORRHOIDS-EXTERNAL 09/19/2009   GERD 09/19/2009   CONSTIPATION 09/19/2009   DYSPHAGIA 09/19/2009   PERSONAL HISTORY OF COLONIC POLYPS 09/19/2009    Past Medical History:   Diagnosis Date   Arthritis    Complication of anesthesia    Constipation    Depression    GERD (gastroesophageal reflux disease)    Hypertension    Hypothyroidism    Osteoporosis    Pneumonia 2018   Polymyalgia (HCC)    Polymyalgia rheumatica (HCC)    PONV (postoperative nausea and vomiting)     Family History  Problem Relation Age of Onset   Tuberculosis Mother 45   CVA Father    Stroke Father 36   Hypertension Sister 78   Lung cancer Brother 77   Hypertension Son    Past Surgical History:  Procedure Laterality Date   BASAL CELL CARCINOMA EXCISION  2022   on face   COLONOSCOPY W/ POLYPECTOMY     EYE SURGERY     both cataracts   LUMBAR LAMINECTOMY/DECOMPRESSION MICRODISCECTOMY N/A 08/15/2018   Procedure: Lumbar three to Lumbar five Decompressive lumbar laminectomy;  Surgeon: Erline Levine, MD;  Location: [facility];  Service: Neurosurgery;  Laterality: N/A;   NASAL SINUS SURGERY     SHOULDER SURGERY Right 2007   repair   TONSILLECTOMY     TRIGGER FINGER RELEASE  03/19/2011   Procedure:  left middle finger RELEASE TRIGGER FINGER/A-1 PULLEY;  Surgeon: Cammie Sickle., MD;  Location: [facility];  Service: Orthopedics;  Laterality: Right;  Procedure:  Release Right Long and Ring Trigger Fingers, Release Left Long Trigger Finger, Injection Left Long Proximal Phalangeal Joint   TRIGGER FINGER RELEASE Right    Ring finger, middle finger   UPPER GASTROINTESTINAL ENDOSCOPY     Social History   Social History Narrative   Patient is widowed.   Patient has two children.   Patient does not drink any caffeine.    Patient has a high school education.   Patient is right-handed.            Immunization History  Administered Date(s) Administered   Influenza Split 12/01/2010, 11/12/2011, 12/13/2011, 12/12/2012, 12/24/2013, 11/30/2019   Influenza, High Dose Seasonal PF 12/07/2015, 12/19/2016, 11/30/2017   Influenza, Quadrivalent, Recombinant, Inj, Pf 10/31/2018    Influenza,inj,Quad PF,6+ Mos 12/24/2013, 11/07/2014   PFIZER Comirnaty(Gray Top)Covid-19 Tri-Sucrose Vaccine 08/23/2020   PFIZER(Purple Top)SARS-COV-2 Vaccination 03/24/2019, 04/13/2019, 11/29/2019   Pneumococcal Conjugate-13 07/21/2016   Pneumococcal Polysaccharide-23 01/01/2009, 01/12/2013   Td,absorbed, Preservative Free, Adult Use, Lf Unspecified 10/18/2011   Tdap 06/05/2013   Zoster Recombinat (Shingrix) 08/01/2017, 10/10/2017   Zoster, Live 07/22/2010, 08/31/2010, 10/10/2017     Objective: Vital Signs: Ht '5\' 4"'  (1.626 m)   Wt 115 lb 6.4 oz (52.3 kg)   BMI 19.81 kg/m    Physical Exam Vitals and nursing note reviewed.  Constitutional:      Appearance: She is well-developed.  HENT:     Head: Normocephalic and atraumatic.  Eyes:     Conjunctiva/sclera: Conjunctivae normal.  Cardiovascular:     Rate and Rhythm: Normal rate and regular rhythm.     Heart sounds: Normal heart sounds.  Pulmonary:     Effort: Pulmonary effort is normal.     Breath sounds: Normal breath sounds.  Abdominal:     General: Bowel sounds are normal.     Palpations: Abdomen is soft.  Musculoskeletal:     Cervical back: Normal range of motion.  Lymphadenopathy:     Cervical: No cervical adenopathy.  Skin:    General: Skin is warm and dry.     Capillary Refill: Capillary refill takes less than 2 seconds.  Neurological:     Mental Status: She is alert and oriented to person, place, and time.  Psychiatric:        Behavior: Behavior normal.     Musculoskeletal Exam: She had good range of motion of her cervical spine.  Shoulder joints, elbow joints, wrist joints, MCPs PIPs and DIPs with good range of motion with no synovitis.  She had bilateral PIP and DIP thickening.  Hip joints and knee joints with good range of motion.  She had no tenderness over ankles or MTPs.  CDAI Exam: CDAI Score: -- Patient Global: --; Provider Global: -- Swollen: --; Tender: -- Joint Exam 07/16/2021   No joint exam  has been documented for this visit   There is currently no information documented on the homunculus. Go to the Rheumatology activity and complete the homunculus joint exam.  Investigation: No additional findings.  Imaging: No results found.  Recent Labs: Lab Results  Component Value Date   WBC 7.2 05/16/2021   HGB 11.7 (L) 05/16/2021   PLT 171 05/16/2021   NA 138 05/16/2021   K 4.3 05/16/2021   CL 101 05/16/2021   CO2 27 05/16/2021   GLUCOSE 111 (H) 05/16/2021   BUN 34 (H) 05/16/2021   CREATININE 1.67 (H) 05/16/2021   BILITOT 1.6 (H) 05/16/2021   ALKPHOS 43 05/16/2021   AST 37 05/16/2021   ALT 24 05/16/2021   PROT 6.8 05/16/2021   ALBUMIN 4.2 05/16/2021   CALCIUM 10.5 (H) 05/16/2021   GFRAA 48 (L) 08/20/2020    Speciality Comments: PLQ eye exam: 10/16/2020 normal. Dr. Gershon Crane. Follow up in 1 year.    Procedures:  No procedures performed Allergies: Patient has no known allergies.   Assessment / Plan:     Visit Diagnoses: PMR (polymyalgia rheumatica) (HCC)-she has been on low-dose Plaquenil which she is tolerating well.  She denies any increased muscular weakness or tenderness.  She has been walking on a regular basis.  Although she is noticing poor balance.  She has been going for physical therapy which is provided at the facility where she lives.  She also participates in the tai chi classes.  High risk medication use - Plaquenil 200 mg p.o. daily. PLQ eye exam: 10/16/2020.  Labs obtained on May 13, 2021 were reviewed.  Her hemoglobin was low at 11.7 and creatinine was elevated at 1.67.  Patient states that she has been going to the nephrologist on a regular basis.  Her recent creatinine was 1.40 per patient.  Advised her to get repeat labs in August.  Information about immunization was placed in her AVS.  History of repair of right rotator cuff-she had good range of motion.  Primary osteoarthritis of both hands-she had bilateral PIP and DIP thickening without any  synovitis.  Primary osteoarthritis of both feet-hypertensions were discussed.  DDD (degenerative disc disease), cervical-she had good range of motion of the cervical spine without any discomfort.  DDD (degenerative disc disease), lumbar-she denies any discomfort today.  Age-related osteoporosis without current pathological fracture - her last bone density was on October 10, 2018.  Left femoral T score was -2.3, BMD 0.591.  She is on Prolia by Dr. Forde Dandy.   Chronic kidney disease, unspecified CKD stage - she is followed by Dr. Royce Macadamia.  Other medical problems are listed  as follows:  Other headache syndrome  History of gastroesophageal reflux (GERD)  Night terrors, adult - followed by Dr. Brett Fairy.  Sleep walking-patient states she has had 2 episodes in the last 1 year.  Last episode was last Sunday.  She bumped her head into the furniture and had bruising and a small cut.  She will be contacting Dr. Brett Fairy.  Anxiety and depression  History of hypertension - followed by Dr. Einar Gip.  Gait instability-she complains of gait instability and poor balance.  I will give her a prescription for physical therapy.  Orders: No orders of the defined types were placed in this encounter.  No orders of the defined types were placed in this encounter.   Follow-Up Instructions: Return in about 5 months (around 12/16/2021) for PMR, Osteoarthritis.   Bo Merino, MD  Note - This record has been created using Editor, commissioning.  Chart creation errors have been sought, but may not always  have been located. Such creation errors do not reflect on  the standard of medical care.

## 2021-07-06 ENCOUNTER — Other Ambulatory Visit: Payer: Self-pay | Admitting: Physician Assistant

## 2021-07-06 NOTE — Telephone Encounter (Signed)
Next Visit: 07/16/2021 ? ?Last Visit: 01/28/2021 ? ?Labs: 05/16/2021, RBC 3.46, hemoglobin 11.7, HCT 32.5, Vitamin B-12 1,421, Glucose 111, BUN 34, Creatinine 1.67, Calcium 10.5, Total Bilirubin 1.6, GFR 30,  ? ?Eye exam: 10/16/2020  ? ?Current Dose per office note 01/28/2021: Plaquenil 200 mg p.o. daily ? ?DX:: PMR (polymyalgia rheumatica) ? ?Last Fill: 03/30/2021 ? ?Okay to refill Plaquenil? ? ?

## 2021-07-16 ENCOUNTER — Ambulatory Visit: Payer: Medicare HMO | Admitting: Rheumatology

## 2021-07-16 ENCOUNTER — Encounter: Payer: Self-pay | Admitting: Rheumatology

## 2021-07-16 VITALS — BP 148/74 | HR 70 | Ht 64.0 in | Wt 115.4 lb

## 2021-07-16 DIAGNOSIS — Z79899 Other long term (current) drug therapy: Secondary | ICD-10-CM | POA: Diagnosis not present

## 2021-07-16 DIAGNOSIS — Z8719 Personal history of other diseases of the digestive system: Secondary | ICD-10-CM | POA: Diagnosis not present

## 2021-07-16 DIAGNOSIS — F514 Sleep terrors [night terrors]: Secondary | ICD-10-CM | POA: Diagnosis not present

## 2021-07-16 DIAGNOSIS — M51369 Other intervertebral disc degeneration, lumbar region without mention of lumbar back pain or lower extremity pain: Secondary | ICD-10-CM

## 2021-07-16 DIAGNOSIS — M5136 Other intervertebral disc degeneration, lumbar region: Secondary | ICD-10-CM

## 2021-07-16 DIAGNOSIS — G4489 Other headache syndrome: Secondary | ICD-10-CM | POA: Diagnosis not present

## 2021-07-16 DIAGNOSIS — M353 Polymyalgia rheumatica: Secondary | ICD-10-CM

## 2021-07-16 DIAGNOSIS — M19071 Primary osteoarthritis, right ankle and foot: Secondary | ICD-10-CM | POA: Diagnosis not present

## 2021-07-16 DIAGNOSIS — N189 Chronic kidney disease, unspecified: Secondary | ICD-10-CM

## 2021-07-16 DIAGNOSIS — Z8679 Personal history of other diseases of the circulatory system: Secondary | ICD-10-CM

## 2021-07-16 DIAGNOSIS — F32A Depression, unspecified: Secondary | ICD-10-CM

## 2021-07-16 DIAGNOSIS — Z9889 Other specified postprocedural states: Secondary | ICD-10-CM | POA: Diagnosis not present

## 2021-07-16 DIAGNOSIS — M5030 Other cervical disc degeneration, unspecified cervical region: Secondary | ICD-10-CM | POA: Diagnosis not present

## 2021-07-16 DIAGNOSIS — M810 Age-related osteoporosis without current pathological fracture: Secondary | ICD-10-CM

## 2021-07-16 DIAGNOSIS — M19072 Primary osteoarthritis, left ankle and foot: Secondary | ICD-10-CM

## 2021-07-16 DIAGNOSIS — M19041 Primary osteoarthritis, right hand: Secondary | ICD-10-CM | POA: Diagnosis not present

## 2021-07-16 DIAGNOSIS — M19042 Primary osteoarthritis, left hand: Secondary | ICD-10-CM

## 2021-07-16 DIAGNOSIS — F513 Sleepwalking [somnambulism]: Secondary | ICD-10-CM

## 2021-07-16 DIAGNOSIS — F419 Anxiety disorder, unspecified: Secondary | ICD-10-CM

## 2021-07-16 DIAGNOSIS — G8929 Other chronic pain: Secondary | ICD-10-CM

## 2021-07-16 DIAGNOSIS — R2681 Unsteadiness on feet: Secondary | ICD-10-CM

## 2021-07-16 NOTE — Patient Instructions (Signed)
Standing Labs We placed an order today for your standing lab work.   Please have your standing labs drawn in August  If possible, please have your labs drawn 2 weeks prior to your appointment so that the provider can discuss your results at your appointment.  Please note that you may see your imaging and lab results in [facility] before we have reviewed them. We may be awaiting multiple results to interpret others before contacting you. Please allow our office up to 72 hours to thoroughly review all of the results before contacting the office for clarification of your results.  We have open lab daily: Monday through Thursday from 1:30-4:30 PM and Friday from 1:30-4:00 PM at the office of Dr. Bo Merino, [facility].   Please be advised, all patients with office appointments requiring lab work will take precedent over walk-in lab work.  If possible, please come for your lab work on Monday and Friday afternoons, as you may experience shorter wait times. The office is located at [address] [place], [place], [place] [address] No appointment is necessary.   Labs are drawn by [facility]. Please bring your co-pay at the time of your lab draw.  You may receive a bill from [facility] for your lab work.  Please note if you are on Hydroxychloroquine and and an order has been placed for a Hydroxychloroquine level, you will need to have it drawn 4 hours or more after your last dose.  If you wish to have your labs drawn at another location, please call the office 24 hours in advance to send orders.  If you have any questions regarding directions or hours of operation,  please call [phone].   As a reminder, please drink plenty of water prior to coming for your lab work. Thanks!   Vaccines You are taking a medication(s) that can suppress your immune system.  The following immunizations are recommended: Flu annually Covid-19  Td/Tdap (tetanus, diphtheria, pertussis) every 10  years Pneumonia (Prevnar 15 then Pneumovax 23 at least 1 year apart.  Alternatively, can take Prevnar 20 without needing additional dose) Shingrix: 2 doses from 4 weeks to 6 months apart  Please check with your PCP to make sure you are up to date.

## 2021-07-17 ENCOUNTER — Telehealth: Payer: Self-pay | Admitting: Rheumatology

## 2021-07-17 NOTE — Telephone Encounter (Signed)
Mailed

## 2021-07-17 NOTE — Telephone Encounter (Signed)
Patient called stating she lost her physical therapy prescription that Dr. Estanislado Pandy gave her yesterday, 07/16/21.  Patient is requesting a new prescription sent to her home address:    [address] [place], [place]  [address]

## 2021-07-29 ENCOUNTER — Ambulatory Visit: Payer: Medicare HMO | Admitting: Podiatry

## 2021-07-29 DIAGNOSIS — L989 Disorder of the skin and subcutaneous tissue, unspecified: Secondary | ICD-10-CM

## 2021-07-29 NOTE — Progress Notes (Signed)
   Subjective: 88 y.o. female presenting to the office today for new complaint of pain to the fifth digit of the right foot.  Patient states that she suffers from symptomatic corn to the outside and inside of the fifth digit right foot.  It is very symptomatic especially close toed shoes.  She presents for further treatment and evaluation.  Currently she has not done anything for treatment  Past Medical History:  Diagnosis Date   Arthritis    Complication of anesthesia    Constipation    Depression    GERD (gastroesophageal reflux disease)    Hypertension    Hypothyroidism    Osteoporosis    Pneumonia 2018   Polymyalgia ([place])    Polymyalgia rheumatica (HCC)    PONV (postoperative nausea and vomiting)    Past Surgical History:  Procedure Laterality Date   BASAL CELL CARCINOMA EXCISION  2022   on face   COLONOSCOPY W/ POLYPECTOMY     EYE SURGERY     both cataracts   LUMBAR LAMINECTOMY/DECOMPRESSION MICRODISCECTOMY N/A 08/15/2018   Procedure: Lumbar three to Lumbar five Decompressive lumbar laminectomy;  Surgeon: Erline Levine, MD;  Location: [facility];  Service: Neurosurgery;  Laterality: N/A;   NASAL SINUS SURGERY     SHOULDER SURGERY Right 2007   repair   TONSILLECTOMY     TRIGGER FINGER RELEASE  03/19/2011   Procedure:  left middle finger RELEASE TRIGGER FINGER/A-1 PULLEY;  Surgeon: Cammie Sickle., MD;  Location: [facility];  Service: Orthopedics;  Laterality: Right;  Procedure:  Release Right Long and Ring Trigger Fingers, Release Left Long Trigger Finger, Injection Left Long Proximal Phalangeal Joint   TRIGGER FINGER RELEASE Right    Ring finger, middle finger   UPPER GASTROINTESTINAL ENDOSCOPY     No Known Allergies   Objective:  Physical Exam General: Alert and oriented x3 in no acute distress  Dermatology: Hyperkeratotic lesion(s) present on the lateral aspect of the fifth toe right foot overlying the PIPJ as well as the medial aspect of the right  fifth toe adjacent to fourth digit. Pain on palpation with a central nucleated core noted. Skin is warm, dry and supple bilateral lower extremities. Negative for open lesions or macerations.  Vascular: Palpable pedal pulses bilaterally. No edema or erythema noted. Capillary refill within normal limits.  Neurological: Epicritic and protective threshold grossly intact bilaterally.   Musculoskeletal Exam: No symptomatic pedal deformities noted  Assessment: 1.  Symptomatic corns right fifth digit x2   Plan of Care:  1. Patient evaluated 2. Excisional debridement of keratoic lesion(s) using a chisel blade was performed without incident.  3.  Recommend wide fitting shoes that do not constrict the toebox area 4. Patient is to return to the clinic PRN.   Edrick Kins, DPM [facility]  Dr. Edrick Kins, DPM    [address], [place] [address]                Office [phone]  Fax [phone]

## 2021-07-31 DIAGNOSIS — M6281 Muscle weakness (generalized): Secondary | ICD-10-CM | POA: Diagnosis not present

## 2021-07-31 DIAGNOSIS — R2689 Other abnormalities of gait and mobility: Secondary | ICD-10-CM | POA: Diagnosis not present

## 2021-08-03 DIAGNOSIS — R2689 Other abnormalities of gait and mobility: Secondary | ICD-10-CM | POA: Diagnosis not present

## 2021-08-03 DIAGNOSIS — M6281 Muscle weakness (generalized): Secondary | ICD-10-CM | POA: Diagnosis not present

## 2021-08-05 DIAGNOSIS — M6281 Muscle weakness (generalized): Secondary | ICD-10-CM | POA: Diagnosis not present

## 2021-08-05 DIAGNOSIS — R2689 Other abnormalities of gait and mobility: Secondary | ICD-10-CM | POA: Diagnosis not present

## 2021-08-12 ENCOUNTER — Telehealth: Payer: Self-pay | Admitting: Neurology

## 2021-08-12 DIAGNOSIS — R2689 Other abnormalities of gait and mobility: Secondary | ICD-10-CM | POA: Diagnosis not present

## 2021-08-12 DIAGNOSIS — M6281 Muscle weakness (generalized): Secondary | ICD-10-CM | POA: Diagnosis not present

## 2021-08-12 NOTE — Telephone Encounter (Signed)
Pt would like to know if there would be a change as to how she takes her clonazePAM (KLONOPIN) 0.5 MG tablet  due to recent episodes of night terrors and sleep walking, please call.

## 2021-08-13 ENCOUNTER — Other Ambulatory Visit: Payer: Self-pay | Admitting: Neurology

## 2021-08-13 DIAGNOSIS — F514 Sleep terrors [night terrors]: Secondary | ICD-10-CM

## 2021-08-13 MED ORDER — CLONAZEPAM 0.5 MG PO TABS: 0.2500 mg | ORAL_TABLET | Freq: Every day | ORAL | 0 refills | Status: DC

## 2021-08-13 NOTE — Telephone Encounter (Signed)
Called the pt and informed her that I spoke with Dr Brett Fairy and she agrees that adding melatonin to the clonazepam would be worth trying. She recommends starting with 3 mg. (Dr Dohmeier did state can go up to 6 mg if needed) Advised the patient where to get this and advised that she should try 1/2 tablet the first night just to make sure she tolerates it well. Pt verbalized understanding. Pt had no questions at this time but was encouraged to call back if questions arise.

## 2021-08-13 NOTE — Telephone Encounter (Signed)
Called the patient. For a while now, due to the recommendation of cardiologist and Dr Dohmeier she has been taking 0.25 mg at bedtime for the clonazepam as opposed to the 0.5 mg she was prescribed before. The 0.5 mg treated her REM BD well but was causing her BP to be too low.  The BP is much better controlled on the 0.25 mg dose.  However, 3 weeks ago she had a episode where she slept walk. She had gotten out of the bed on the opposite side that she use to and hit her head on furniture. EMS evaluated and she was ok and didn't need ER. She states that several days ago she had episodes of night terrors. She woke herself up screaming but went back to sleep.  Pt can handle the night terrors but the sleep walking and potential of getting hurt again is scary. She is asking what can be done, if anything, to help hopefully prevent this but not effect her BP.   Pt has not tried melatonin before. Informed her that I would run this information by Dr Brett Fairy and see what her thoughts and recommendations would be.

## 2021-08-14 DIAGNOSIS — M6281 Muscle weakness (generalized): Secondary | ICD-10-CM | POA: Diagnosis not present

## 2021-08-14 DIAGNOSIS — R2689 Other abnormalities of gait and mobility: Secondary | ICD-10-CM | POA: Diagnosis not present

## 2021-08-17 DIAGNOSIS — M6281 Muscle weakness (generalized): Secondary | ICD-10-CM | POA: Diagnosis not present

## 2021-08-17 DIAGNOSIS — R2689 Other abnormalities of gait and mobility: Secondary | ICD-10-CM | POA: Diagnosis not present

## 2021-08-18 DIAGNOSIS — M6281 Muscle weakness (generalized): Secondary | ICD-10-CM | POA: Diagnosis not present

## 2021-08-18 DIAGNOSIS — R2689 Other abnormalities of gait and mobility: Secondary | ICD-10-CM | POA: Diagnosis not present

## 2021-08-19 DIAGNOSIS — H5051 Esophoria: Secondary | ICD-10-CM | POA: Diagnosis not present

## 2021-08-19 DIAGNOSIS — H5053 Vertical heterophoria: Secondary | ICD-10-CM | POA: Diagnosis not present

## 2021-08-24 DIAGNOSIS — H6123 Impacted cerumen, bilateral: Secondary | ICD-10-CM | POA: Diagnosis not present

## 2021-08-24 DIAGNOSIS — M6281 Muscle weakness (generalized): Secondary | ICD-10-CM | POA: Diagnosis not present

## 2021-08-24 DIAGNOSIS — R2689 Other abnormalities of gait and mobility: Secondary | ICD-10-CM | POA: Diagnosis not present

## 2021-08-26 DIAGNOSIS — R2689 Other abnormalities of gait and mobility: Secondary | ICD-10-CM | POA: Diagnosis not present

## 2021-08-26 DIAGNOSIS — M6281 Muscle weakness (generalized): Secondary | ICD-10-CM | POA: Diagnosis not present

## 2021-08-31 DIAGNOSIS — M6281 Muscle weakness (generalized): Secondary | ICD-10-CM | POA: Diagnosis not present

## 2021-08-31 DIAGNOSIS — R2689 Other abnormalities of gait and mobility: Secondary | ICD-10-CM | POA: Diagnosis not present

## 2021-09-02 DIAGNOSIS — M6281 Muscle weakness (generalized): Secondary | ICD-10-CM | POA: Diagnosis not present

## 2021-09-02 DIAGNOSIS — R2689 Other abnormalities of gait and mobility: Secondary | ICD-10-CM | POA: Diagnosis not present

## 2021-09-07 ENCOUNTER — Ambulatory Visit: Payer: Medicare HMO | Admitting: Podiatry

## 2021-09-07 DIAGNOSIS — L989 Disorder of the skin and subcutaneous tissue, unspecified: Secondary | ICD-10-CM

## 2021-09-07 DIAGNOSIS — R2689 Other abnormalities of gait and mobility: Secondary | ICD-10-CM | POA: Diagnosis not present

## 2021-09-07 DIAGNOSIS — M6281 Muscle weakness (generalized): Secondary | ICD-10-CM | POA: Diagnosis not present

## 2021-09-07 NOTE — Progress Notes (Signed)
   HPI: 88 y.o. female presenting today for follow-up evaluation of symptomatic calluses to the medial aspect of the bilateral fifth toes.  Patient states that she did feel some slight relief however the calluses have returned.  She presents for further treatment and evaluation  Past Medical History:  Diagnosis Date   Arthritis    Complication of anesthesia    Constipation    Depression    GERD (gastroesophageal reflux disease)    Hypertension    Hypothyroidism    Osteoporosis    Pneumonia 2018   Polymyalgia ([place])    Polymyalgia rheumatica (HCC)    PONV (postoperative nausea and vomiting)     Past Surgical History:  Procedure Laterality Date   BASAL CELL CARCINOMA EXCISION  2022   on face   COLONOSCOPY W/ POLYPECTOMY     EYE SURGERY     both cataracts   LUMBAR LAMINECTOMY/DECOMPRESSION MICRODISCECTOMY N/A 08/15/2018   Procedure: Lumbar three to Lumbar five Decompressive lumbar laminectomy;  Surgeon: Erline Levine, MD;  Location: [facility];  Service: Neurosurgery;  Laterality: N/A;   NASAL SINUS SURGERY     SHOULDER SURGERY Right 2007   repair   TONSILLECTOMY     TRIGGER FINGER RELEASE  03/19/2011   Procedure:  left middle finger RELEASE TRIGGER FINGER/A-1 PULLEY;  Surgeon: Cammie Sickle., MD;  Location: [facility];  Service: Orthopedics;  Laterality: Right;  Procedure:  Release Right Long and Ring Trigger Fingers, Release Left Long Trigger Finger, Injection Left Long Proximal Phalangeal Joint   TRIGGER FINGER RELEASE Right    Ring finger, middle finger   UPPER GASTROINTESTINAL ENDOSCOPY      No Known Allergies   Physical Exam: General: The patient is alert and oriented x3 in no acute distress.  Dermatology: Symptomatic calluses noted to the medial aspect of the bilateral fifth digits adjacent to the fourth toe  Vascular: Palpable pedal pulses bilaterally. Capillary refill within normal limits.  Negative for any significant edema or  erythema  Neurological: Light touch and protective threshold grossly intact  Musculoskeletal Exam: No pedal deformities noted   Assessment: 1.  Symptomatic calluses medial aspect of the fifth digit adjacent to the fourth toe bilateral   Plan of Care:  1. Patient evaluated.  2.  Excisional debridement of the symptomatic callus lesion to the medial aspect of the fifth digit bilateral was performed using a 312 blade. 3.  Silicone toe spacers were dispensed.  Wear daily 4.  Advised patient against wearing any narrow close toed shoes or sandals that have straps across the toes. 5.  Return to clinic as needed     Edrick Kins, DPM [facility]  Dr. Edrick Kins, DPM    [address],  [address]                Office [phone]  Fax [phone]

## 2021-09-08 ENCOUNTER — Other Ambulatory Visit: Payer: Self-pay | Admitting: Neurology

## 2021-09-08 DIAGNOSIS — F514 Sleep terrors [night terrors]: Secondary | ICD-10-CM

## 2021-09-09 DIAGNOSIS — R2689 Other abnormalities of gait and mobility: Secondary | ICD-10-CM | POA: Diagnosis not present

## 2021-09-09 DIAGNOSIS — H5051 Esophoria: Secondary | ICD-10-CM | POA: Diagnosis not present

## 2021-09-09 DIAGNOSIS — M6281 Muscle weakness (generalized): Secondary | ICD-10-CM | POA: Diagnosis not present

## 2021-09-09 DIAGNOSIS — Z0100 Encounter for examination of eyes and vision without abnormal findings: Secondary | ICD-10-CM | POA: Diagnosis not present

## 2021-09-09 DIAGNOSIS — Z79899 Other long term (current) drug therapy: Secondary | ICD-10-CM | POA: Diagnosis not present

## 2021-09-09 DIAGNOSIS — Z961 Presence of intraocular lens: Secondary | ICD-10-CM | POA: Diagnosis not present

## 2021-09-21 DIAGNOSIS — M6281 Muscle weakness (generalized): Secondary | ICD-10-CM | POA: Diagnosis not present

## 2021-09-21 DIAGNOSIS — R2689 Other abnormalities of gait and mobility: Secondary | ICD-10-CM | POA: Diagnosis not present

## 2021-09-23 DIAGNOSIS — M6281 Muscle weakness (generalized): Secondary | ICD-10-CM | POA: Diagnosis not present

## 2021-09-23 DIAGNOSIS — R2689 Other abnormalities of gait and mobility: Secondary | ICD-10-CM | POA: Diagnosis not present

## 2021-09-27 ENCOUNTER — Other Ambulatory Visit: Payer: Self-pay | Admitting: Physician Assistant

## 2021-09-28 DIAGNOSIS — R2689 Other abnormalities of gait and mobility: Secondary | ICD-10-CM | POA: Diagnosis not present

## 2021-09-28 DIAGNOSIS — M6281 Muscle weakness (generalized): Secondary | ICD-10-CM | POA: Diagnosis not present

## 2021-09-28 NOTE — Telephone Encounter (Signed)
Next Visit: 12/16/2021  Last Visit: 07/16/2021  Labs: 3/18/22023 RBC 3.46, hemoglobin 11.7, HCT 32.5, Vitamin B12 1,421, Glucose 111, BUN 34, Creatinine 1.67, Calcium 10.5, Total Bilirubin 1.6, GFR 30,   Eye exam: 10/16/2020   Current Dose per office note 07/16/2021: Plaquenil 200 mg p.o. daily  DX: PMR (polymyalgia rheumatica)   Last Fill: 07/06/2021  Okay to refill Plaquenil?

## 2021-09-30 DIAGNOSIS — R2689 Other abnormalities of gait and mobility: Secondary | ICD-10-CM | POA: Diagnosis not present

## 2021-09-30 DIAGNOSIS — M6281 Muscle weakness (generalized): Secondary | ICD-10-CM | POA: Diagnosis not present

## 2021-10-05 ENCOUNTER — Encounter: Payer: Self-pay | Admitting: Cardiology

## 2021-10-05 ENCOUNTER — Ambulatory Visit: Payer: Medicare HMO | Admitting: Cardiology

## 2021-10-05 VITALS — BP 171/65 | HR 91 | Temp 97.3°F | Resp 16 | Ht 64.0 in | Wt 116.6 lb

## 2021-10-05 DIAGNOSIS — M6281 Muscle weakness (generalized): Secondary | ICD-10-CM | POA: Diagnosis not present

## 2021-10-05 DIAGNOSIS — I10 Essential (primary) hypertension: Secondary | ICD-10-CM | POA: Diagnosis not present

## 2021-10-05 DIAGNOSIS — N1831 Chronic kidney disease, stage 3a: Secondary | ICD-10-CM

## 2021-10-05 DIAGNOSIS — I951 Orthostatic hypotension: Secondary | ICD-10-CM | POA: Diagnosis not present

## 2021-10-05 DIAGNOSIS — R2689 Other abnormalities of gait and mobility: Secondary | ICD-10-CM | POA: Diagnosis not present

## 2021-10-05 MED ORDER — MIDODRINE HCL 2.5 MG PO TABS: 2.5000 mg | ORAL_TABLET | Freq: Two times a day (BID) | ORAL | 1 refills | Status: DC | PRN

## 2021-10-05 NOTE — Progress Notes (Signed)
Primary Physician/Referring:  [name], MD  Patient ID: Gloria Anderson, female    DOB: 13-Oct-1933, 88 y.o.   MRN: [id number]  Chief Complaint  Patient presents with   Orthostatic hyotension   HPI:    Jesslyn Viglione  is a 88 y.o. Caucasian female patient with with supine hypertension and orthostatic hypotension, chronic stage 3 CKD, mild hyperlipidemia, moderate MR and TR, moderate pulmonary hypertension.  She is extremely sensitive to blood pressure variations.  She could not tolerate even minimal doses of antihypertensive medications due to marked dizziness and low blood pressure, systolic blood pressure dropping down to 70 mmHg even with carvedilol 3.125 mg twice daily.  She does sleep on a wedge at night due to orthostatic hypertension.  She has been wearing support stockings while she is up on her feet.  Denies chest pain or palpitations.  Past Medical History:  Diagnosis Date   Arthritis    Complication of anesthesia    Constipation    Depression    GERD (gastroesophageal reflux disease)    Hypertension    Hypothyroidism    Osteoporosis    Pneumonia 2018   Polymyalgia ([place])    Polymyalgia rheumatica (HCC)    PONV (postoperative nausea and vomiting)     Social History   Tobacco Use   Smoking status: Never    Passive exposure: Never   Smokeless tobacco: Never  Substance Use Topics   Alcohol use: No    Alcohol/week: 0.0 standard drinks of alcohol   ROS  Review of Systems  Cardiovascular:  Negative for chest pain, dyspnea on exertion, leg swelling and palpitations.  Gastrointestinal:  Negative for melena.  Neurological:  Positive for dizziness (occassional).  Psychiatric/Behavioral:  The patient is nervous/anxious (about health).    Objective  Blood pressure (!) 171/65, pulse 91, temperature (!) 97.3 F (36.3 C), temperature source Temporal, resp. rate 16, height '5\' 4"'  (1.626 m), weight 116 lb 9.6 oz (52.9 kg), SpO2 96 %.     10/05/2021    1:49 PM  07/16/2021   12:16 PM 07/16/2021   11:21 AM  Vitals with BMI  Height '5\' 4"'   '5\' 4"'   Weight 116 lbs 10 oz  115 lbs 6 oz  BMI 20  94.8  Systolic 016 553   Diastolic 65 74   Pulse 91 70     Orthostatic VS for the past 72 hrs (Last 3 readings):  Orthostatic BP Patient Position BP Location Cuff Size Orthostatic Pulse  10/05/21 1358 128/64 Standing Left Arm Normal 95  10/05/21 1357 164/74 Sitting Left Arm Normal 81  10/05/21 1356 170/74 Supine Left Arm Normal 82    Physical Exam Vitals reviewed.  Constitutional:      Appearance: She is well-developed.  Neck:     Vascular: No carotid bruit or JVD.  Cardiovascular:     Rate and Rhythm: Normal rate and regular rhythm.     Pulses: Normal pulses and intact distal pulses.     Heart sounds: No murmur heard. Pulmonary:     Effort: Pulmonary effort is normal. No accessory muscle usage or respiratory distress.     Breath sounds: Normal breath sounds.  Abdominal:     General: There is abdominal bruit.     Palpations: Abdomen is soft.  Neurological:     Mental Status: She is alert.    Laboratory examination:   Recent Labs    01/28/21 1504 05/16/21 1817  NA 142 138  K 4.6 4.3  CL 105 101  CO2 29 27  GLUCOSE 87 111*  BUN 30* 34*  CREATININE 1.32* 1.67*  CALCIUM 9.9 10.5*  GFRNONAA  --  30*   CrCl cannot be calculated (Patient's most recent lab result is older than the maximum 21 days allowed.).     Latest Ref Rng & Units 05/16/2021    6:17 PM 01/28/2021    3:04 PM 08/20/2020    3:44 PM  CMP  Glucose 70 - 99 mg/dL 111  87  128   BUN 8 - 23 mg/dL 34  30  22   Creatinine 0.44 - 1.00 mg/dL 1.67  1.32  1.21   Sodium 135 - 145 mmol/L 138  142  138   Potassium 3.5 - 5.1 mmol/L 4.3  4.6  4.7   Chloride 98 - 111 mmol/L 101  105  100   CO2 22 - 32 mmol/L '27  29  28   ' Calcium 8.9 - 10.3 mg/dL 10.5  9.9  9.6   Total Protein 6.5 - 8.1 g/dL 6.8  6.9  6.8   Total Bilirubin 0.3 - 1.2 mg/dL 1.6  1.1  1.0   Alkaline Phos 38 - 126 U/L 43      AST 15 - 41 U/L 37  31  29   ALT 0 - 44 U/L '24  25  21       ' Latest Ref Rng & Units 05/16/2021    6:17 PM 01/28/2021    3:04 PM 08/20/2020    3:44 PM  CBC  WBC 4.0 - 10.5 K/uL 7.2  4.9  5.2   Hemoglobin 12.0 - 15.0 g/dL 11.7  10.7  11.1   Hematocrit 36.0 - 46.0 % 32.5  31.5  33.2   Platelets 150 - 400 K/uL 171  163  184    External labs:  Labs 03/11/2021:  Hb 11.6/HCT 33.0, platelets 209.  Normal indicis.  Serum glucose 119, BUN 21, creatinine 1.2, EGFR 42 mL, potassium 4.6.  Magnesium 2.1.  Hb 11.5/HCT 31.4, platelets 158.  Normal indicis. Cholesterol, total 162.000 m 05/26/2020 HDL 79.000 mg 05/26/2020 LDL 74.000 mg 05/26/2020 Triglycerides 43.000 mg 05/26/2020  A1C 4.900 % 05/26/2020  Allergies  No Known Allergies   Final Medications at End of Visit     Current Outpatient Medications:    acetaminophen (TYLENOL) 325 MG tablet, Take 325 mg by mouth every 6 (six) hours as needed for moderate pain or headache., Disp: , Rfl:    Apoaequorin (PREVAGEN) 10 MG CAPS, Take 10 mg by mouth daily after breakfast., Disp: 30 capsule, Rfl: 0   atorvastatin (LIPITOR) 10 MG tablet, Take 10 mg by mouth every evening., Disp: , Rfl:    BIOTIN FORTE PO, Take 1 capsule by mouth daily., Disp: , Rfl:    Calcium Citrate (CITRACAL PO), Take 2 tablets by mouth in the morning and at bedtime., Disp: , Rfl:    denosumab (PROLIA) 60 MG/ML SOSY injection, Inject 60 mg into the skin every 6 (six) months., Disp: , Rfl:    fish oil-omega-3 fatty acids 1000 MG capsule, Take 1 g by mouth daily. , Disp: , Rfl:    hydroxychloroquine (PLAQUENIL) 200 MG tablet, TAKE 1 TABLET DAILY, Disp: 90 tablet, Rfl: 0   levothyroxine (SYNTHROID, LEVOTHROID) 50 MCG tablet, Take 50 mcg by mouth daily before breakfast. , Disp: , Rfl:    Lifitegrast (XIIDRA OP), Apply 1 drop to eye 2 (two) times daily., Disp: , Rfl:    midodrine (PROAMATINE) 2.5 MG tablet, Take  1 tablet (2.5 mg total) by mouth 2 (two) times daily as needed. Use it  for dizziness.  Please avoid laying down for 4 hours after taking the medication., Disp: 180 tablet, Rfl: 1   Multiple Vitamin (MULTIVITAMIN PO), Take 1 tablet by mouth daily., Disp: , Rfl:    omeprazole (PRILOSEC) 20 MG capsule, Take 20 mg by mouth daily., Disp: , Rfl:    OVER THE COUNTER MEDICATION, Take 2 tablets by mouth in the morning and at bedtime. Calcium mini, Disp: , Rfl:    polyethylene glycol (MIRALAX / GLYCOLAX) packet, Take 17 g by mouth daily as needed for moderate constipation. , Disp: , Rfl:    Probiotic Product (PROBIOTIC DAILY PO), Take 1 tablet by mouth daily. Culturelle, Disp: , Rfl:    carvedilol (COREG) 3.125 MG tablet, Take 3.125 mg by mouth 2 (two) times daily as needed. (Patient not taking: Reported on 10/05/2021), Disp: , Rfl:    clonazePAM (KLONOPIN) 0.5 MG tablet, Take 1 tablet by mouth at bedtime., Disp: , Rfl:    Propylene Glycol (SYSTANE BALANCE OP), Apply 1 drop to eye as needed., Disp: , Rfl:    Radiology:  No results found.  Cardiac Studies:   Exercise Treadmill Stress Test 10/07/2017:  Indication: chest pain The patient exercised on Bruce protocol for  06:19 min. Patient achieved  7.40 METS and reached HR  114 bpm, which is  82 % of maximum age-predicted HR.  Stress test terminated due to fatigue.    Exercise capacity was fair for age. HR Response to Exercise: Appropriate. BP Response to Exercise: Normal resting BP- appropriate response. Chest Pain: none. Arrhythmias: Occasional PVC s. Resting EKG demonstrates Normal sinus rhythm. ST Changes: With peak exercise there was no ST-T changes of ischemia.   Overall Impression:  Submaximal stress test with no ischemic changes. Continue primary/secondary prevention.   Echocardiogram 12/13/2018: Normal LV systolic function with EF 67%. Left ventricle  cavity is normal in size. Normal left ventricular wall  thickness. Normal global wall motion. Diastolic function  could not be assessed due to severity of mitral   regurgitation.  Moderate (Grade II) mitral regurgitation. Moderate tricuspid regurgitation. Moderate pulmonary hypertension. Estimated pulmonary artery systolic pressure is  50 mmHg.  Small circumferential pericardial effusion wth no hemodynamic  compromise.  IVC is dilated with a respiratory response of <50%. Estimated  RA pressure 10-15 mmHg. No significant change compared to previous study on  05/09/2018.  Carotid artery duplex  07/04/2019: No hemodynamically significant arterial disease in the internal carotid artery bilaterally. Antegrade right vertebral artery flow. Antegrade left vertebral artery flow.  Ambulatory cardiac telemetry 5 days (05/12/2021 - 05/17/2021): Predominant underlying rhythm was sinus.  Patient had episodes of supraventricular tachycardia as well as atrial tachycardia which were symptomatic.  Rare PACs and PVCs.  No evidence of atrial fibrillation, high degree AV block, pauses >3 seconds, or ventricular tachycardia.  Longest episode of atrial tachycardia lasting 4 hours and 45 minutes.  And heart rate 56 bpm, maximum heart rate 2018 bpm.  EKG:    EKG 10/05/2021: Normal sinus rhythm at rate of 86 bpm, normal axis, no evidence of ischemia, normal EKG.  No significant change from 04/06/2021.  Assessment     ICD-10-CM   1. Orthostatic hypotension  I95.1 EKG 12-Lead    midodrine (PROAMATINE) 2.5 MG tablet    2. Supine hypertension  I10     3. Stage 3a chronic kidney disease (HCC)  N18.31       Meds  ordered this encounter  Medications   midodrine (PROAMATINE) 2.5 MG tablet    Sig: Take 1 tablet (2.5 mg total) by mouth 2 (two) times daily as needed. Use it for dizziness.  Please avoid laying down for 4 hours after taking the medication.    Dispense:  180 tablet    Refill:  1     Medications Discontinued During This Encounter  Medication Reason   clonazePAM (KLONOPIN) 0.5 MG tablet       Recommendations:   Xenia Nile  is a 88 y.o. Caucasian female  patient with with supine hypertension and orthostatic hypotension, chronic stage 3 CKD, mild hyperlipidemia, moderate MR and TR, moderate pulmonary hypertension.  She is extremely sensitive to blood pressure variations.  Given today in office she was markedly hypertensive but on standing drops significant amount of blood pressure anywhere from 50 to 60 mmHg.  Hence I will advised her that only standing blood pressure needs to be treated and not supine hypertension when she goes sees other physicians.  Although 88 years of age, she keeps herself very active.  She continues to wear support stockings while awake and is aware that she should not wear the stockings when she is laying down and avoid them when she is sitting.  I will also prescribe her low-dose midodrine 2.5 mg to be taken only while she is up on her feet.  Clear-cut instructions were given of avoiding sitting and not to sleep or lay down at least for 4 hours after taking the medication.  Otherwise she has remained stable, it was a pleasure to see her today.  Renal function has remained stable and she follows both with Dr. [name] and also with Dr. Royce Macadamia from nephrology standpoint.  I will see her back in 6 months.    Adrian Prows, MD, [facility] 10/05/2021, 3:51 PM Office: [phone] Fax: [phone] Pager: [phone]

## 2021-10-07 DIAGNOSIS — R2689 Other abnormalities of gait and mobility: Secondary | ICD-10-CM | POA: Diagnosis not present

## 2021-10-07 DIAGNOSIS — M6281 Muscle weakness (generalized): Secondary | ICD-10-CM | POA: Diagnosis not present

## 2021-10-12 DIAGNOSIS — R2689 Other abnormalities of gait and mobility: Secondary | ICD-10-CM | POA: Diagnosis not present

## 2021-10-12 DIAGNOSIS — M6281 Muscle weakness (generalized): Secondary | ICD-10-CM | POA: Diagnosis not present

## 2021-10-14 DIAGNOSIS — M6281 Muscle weakness (generalized): Secondary | ICD-10-CM | POA: Diagnosis not present

## 2021-10-14 DIAGNOSIS — R2689 Other abnormalities of gait and mobility: Secondary | ICD-10-CM | POA: Diagnosis not present

## 2021-10-15 DIAGNOSIS — M5416 Radiculopathy, lumbar region: Secondary | ICD-10-CM | POA: Diagnosis not present

## 2021-10-15 DIAGNOSIS — R7302 Impaired glucose tolerance (oral): Secondary | ICD-10-CM | POA: Diagnosis not present

## 2021-10-15 DIAGNOSIS — E039 Hypothyroidism, unspecified: Secondary | ICD-10-CM | POA: Diagnosis not present

## 2021-10-15 DIAGNOSIS — I679 Cerebrovascular disease, unspecified: Secondary | ICD-10-CM | POA: Diagnosis not present

## 2021-10-15 DIAGNOSIS — N1832 Chronic kidney disease, stage 3b: Secondary | ICD-10-CM | POA: Diagnosis not present

## 2021-10-15 DIAGNOSIS — R479 Unspecified speech disturbances: Secondary | ICD-10-CM | POA: Diagnosis not present

## 2021-10-15 DIAGNOSIS — M353 Polymyalgia rheumatica: Secondary | ICD-10-CM | POA: Diagnosis not present

## 2021-10-15 DIAGNOSIS — I340 Nonrheumatic mitral (valve) insufficiency: Secondary | ICD-10-CM | POA: Diagnosis not present

## 2021-10-15 DIAGNOSIS — M810 Age-related osteoporosis without current pathological fracture: Secondary | ICD-10-CM | POA: Diagnosis not present

## 2021-10-15 DIAGNOSIS — I10 Essential (primary) hypertension: Secondary | ICD-10-CM | POA: Diagnosis not present

## 2021-10-15 DIAGNOSIS — I700 Atherosclerosis of aorta: Secondary | ICD-10-CM | POA: Diagnosis not present

## 2021-10-15 DIAGNOSIS — R69 Illness, unspecified: Secondary | ICD-10-CM | POA: Diagnosis not present

## 2021-10-15 DIAGNOSIS — D649 Anemia, unspecified: Secondary | ICD-10-CM | POA: Diagnosis not present

## 2021-10-15 DIAGNOSIS — E785 Hyperlipidemia, unspecified: Secondary | ICD-10-CM | POA: Diagnosis not present

## 2021-10-15 DIAGNOSIS — I071 Rheumatic tricuspid insufficiency: Secondary | ICD-10-CM | POA: Diagnosis not present

## 2021-10-15 DIAGNOSIS — R251 Tremor, unspecified: Secondary | ICD-10-CM | POA: Diagnosis not present

## 2021-10-15 DIAGNOSIS — I129 Hypertensive chronic kidney disease with stage 1 through stage 4 chronic kidney disease, or unspecified chronic kidney disease: Secondary | ICD-10-CM | POA: Diagnosis not present

## 2021-10-19 ENCOUNTER — Telehealth: Payer: Self-pay

## 2021-10-19 DIAGNOSIS — M6281 Muscle weakness (generalized): Secondary | ICD-10-CM | POA: Diagnosis not present

## 2021-10-19 DIAGNOSIS — M353 Polymyalgia rheumatica: Secondary | ICD-10-CM | POA: Diagnosis not present

## 2021-10-19 DIAGNOSIS — Z961 Presence of intraocular lens: Secondary | ICD-10-CM | POA: Diagnosis not present

## 2021-10-19 DIAGNOSIS — Z79899 Other long term (current) drug therapy: Secondary | ICD-10-CM | POA: Diagnosis not present

## 2021-10-19 DIAGNOSIS — R2689 Other abnormalities of gait and mobility: Secondary | ICD-10-CM | POA: Diagnosis not present

## 2021-10-19 NOTE — Telephone Encounter (Signed)
Thank you :)

## 2021-10-19 NOTE — Telephone Encounter (Signed)
Gloria Anderson - spoke with patient about a hypotensive episode she reported to have on Saturday (SBP in 60's). Confirmed patient takes her BP's standing up. She reported to be dizzy and weak at the time. She did not take a midodrine dose. BP is back to normal for her (SBP 90s)  Talked to her about when and how to use the midodrine.   Patient's insurance does not cover PCM or RPM services.

## 2021-10-21 DIAGNOSIS — R2689 Other abnormalities of gait and mobility: Secondary | ICD-10-CM | POA: Diagnosis not present

## 2021-10-21 DIAGNOSIS — M6281 Muscle weakness (generalized): Secondary | ICD-10-CM | POA: Diagnosis not present

## 2021-10-26 ENCOUNTER — Telehealth: Payer: Self-pay | Admitting: Rheumatology

## 2021-10-26 DIAGNOSIS — M6281 Muscle weakness (generalized): Secondary | ICD-10-CM | POA: Diagnosis not present

## 2021-10-26 DIAGNOSIS — R2689 Other abnormalities of gait and mobility: Secondary | ICD-10-CM | POA: Diagnosis not present

## 2021-10-26 NOTE — Telephone Encounter (Signed)
Patient called the office stating she has called the hospital multiple times to schedule her Prolia and no one will call her back to schedule. Patient to know if she can have her Prolia done in office here instead.

## 2021-10-27 ENCOUNTER — Other Ambulatory Visit (HOSPITAL_COMMUNITY): Payer: Self-pay

## 2021-10-27 NOTE — Telephone Encounter (Signed)
Patient's copay for Prolia through pharmacy benefit is $100. Patient's Prolia is being managed by her PCP Dr. Forde Dandy.  She said she has Prolia now scheduled for 11/06/21 at infusion center.  Knox Saliva, PharmD, MPH, BCPS, CPP Clinical Pharmacist (Rheumatology and Pulmonology)

## 2021-10-28 DIAGNOSIS — L814 Other melanin hyperpigmentation: Secondary | ICD-10-CM | POA: Diagnosis not present

## 2021-10-28 DIAGNOSIS — Z85828 Personal history of other malignant neoplasm of skin: Secondary | ICD-10-CM | POA: Diagnosis not present

## 2021-10-28 DIAGNOSIS — Z08 Encounter for follow-up examination after completed treatment for malignant neoplasm: Secondary | ICD-10-CM | POA: Diagnosis not present

## 2021-10-28 DIAGNOSIS — R2689 Other abnormalities of gait and mobility: Secondary | ICD-10-CM | POA: Diagnosis not present

## 2021-10-28 DIAGNOSIS — D225 Melanocytic nevi of trunk: Secondary | ICD-10-CM | POA: Diagnosis not present

## 2021-10-28 DIAGNOSIS — M6281 Muscle weakness (generalized): Secondary | ICD-10-CM | POA: Diagnosis not present

## 2021-10-28 DIAGNOSIS — L821 Other seborrheic keratosis: Secondary | ICD-10-CM | POA: Diagnosis not present

## 2021-11-02 DIAGNOSIS — R2689 Other abnormalities of gait and mobility: Secondary | ICD-10-CM | POA: Diagnosis not present

## 2021-11-02 DIAGNOSIS — M6281 Muscle weakness (generalized): Secondary | ICD-10-CM | POA: Diagnosis not present

## 2021-11-04 DIAGNOSIS — M6281 Muscle weakness (generalized): Secondary | ICD-10-CM | POA: Diagnosis not present

## 2021-11-04 DIAGNOSIS — R2689 Other abnormalities of gait and mobility: Secondary | ICD-10-CM | POA: Diagnosis not present

## 2021-11-05 ENCOUNTER — Telehealth: Payer: Self-pay

## 2021-11-05 ENCOUNTER — Other Ambulatory Visit (HOSPITAL_COMMUNITY): Payer: Self-pay | Admitting: *Deleted

## 2021-11-05 NOTE — Telephone Encounter (Signed)
FYI - spoke with patient about another hypotensive episode she reported to have yesterday (SBP in 60's). Confirmed patient took her BP's standing up. She reported to be dizzy at the time. She did not take a midodrine dose but rested and had a Pedialyte and her BP improved to SBP 125 later on.

## 2021-11-06 ENCOUNTER — Ambulatory Visit (HOSPITAL_COMMUNITY)
Admission: RE | Admit: 2021-11-06 | Discharge: 2021-11-06 | Disposition: A | Discharge status: Home / Self Care | Payer: Medicare HMO | Source: Ambulatory Visit | Attending: Endocrinology | Admitting: Endocrinology

## 2021-11-06 DIAGNOSIS — M810 Age-related osteoporosis without current pathological fracture: Secondary | ICD-10-CM | POA: Diagnosis not present

## 2021-11-06 MED ORDER — DENOSUMAB 60 MG/ML ~~LOC~~ SOSY
60.0000 mg | PREFILLED_SYRINGE | Freq: Once | SUBCUTANEOUS | Status: AC
  Administered 2021-11-06: 60 mg via SUBCUTANEOUS

## 2021-11-09 DIAGNOSIS — R2689 Other abnormalities of gait and mobility: Secondary | ICD-10-CM | POA: Diagnosis not present

## 2021-11-09 DIAGNOSIS — M6281 Muscle weakness (generalized): Secondary | ICD-10-CM | POA: Diagnosis not present

## 2021-11-11 DIAGNOSIS — R2689 Other abnormalities of gait and mobility: Secondary | ICD-10-CM | POA: Diagnosis not present

## 2021-11-11 DIAGNOSIS — M6281 Muscle weakness (generalized): Secondary | ICD-10-CM | POA: Diagnosis not present

## 2021-11-16 ENCOUNTER — Encounter: Payer: Self-pay | Admitting: Neurology

## 2021-11-16 ENCOUNTER — Ambulatory Visit: Payer: Medicare HMO | Admitting: Neurology

## 2021-11-16 VITALS — BP 120/72 | HR 84 | Ht 64.0 in | Wt 117.0 lb

## 2021-11-16 DIAGNOSIS — R569 Unspecified convulsions: Secondary | ICD-10-CM

## 2021-11-16 DIAGNOSIS — Q282 Arteriovenous malformation of cerebral vessels: Secondary | ICD-10-CM | POA: Diagnosis not present

## 2021-11-16 DIAGNOSIS — R2689 Other abnormalities of gait and mobility: Secondary | ICD-10-CM | POA: Diagnosis not present

## 2021-11-16 DIAGNOSIS — M6281 Muscle weakness (generalized): Secondary | ICD-10-CM | POA: Diagnosis not present

## 2021-11-16 DIAGNOSIS — R419 Unspecified symptoms and signs involving cognitive functions and awareness: Secondary | ICD-10-CM | POA: Diagnosis not present

## 2021-11-16 MED ORDER — CLONAZEPAM 0.5 MG PO TABS: 0.5000 mg | ORAL_TABLET | Freq: Every day | ORAL | 5 refills | Status: DC

## 2021-11-16 NOTE — Patient Instructions (Signed)

## 2021-11-16 NOTE — Progress Notes (Signed)
[facility]  Provider:  Larey Seat, MD    Referring Provider: [name], MD Primary Care Physician:  [name], MD  Chief Complaint  Patient presents with   Follow-up    RM 10, alone. C/o dizziness.    HPI:    This patient of Dr. 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 She tolerates Klonopin and is happy with the REM sleep control. She has developed higher blood pressures spurs of higher blood pressures that Dr. Forde Dandy and Dr. Einar Gip have followed, and initiated treatment- she developed orthostatic BP!Marland Kitchen She was found to be anemic, sleepy and fatigued. Has normal B 12 and started oral iron. She has a history of PMR. Polymyalgia rheumatica. She had a normal colonoscopy in 2016. Had lost weight , but recovered . Mrs. Sonneborn brought her last laboratory results with her, white blood cell count was 3.74K, red blood cell count was 2.9, hemoglobin 9.8 g, hematocrit 28.4%, corpuscular volume 97.1 MCH 33.5. She was over the phone prescribed Nu-iron to take as a supplement. Needs TIBC, Total iron deficiency,   09-26-13- The patient's sleep study from 08-05-13 revealed no apnea, no oxygen desaturation, no irregular heart beats, and only took clusters of limb movements that seem to be related to discomfort with sleeping on the left side. She had no periodic limb movements during REM sleep. There was a prolonged period of slow-wave sleep at around 2 AM during which nor night terrors sleep walking or sleep talking was ordered. The patient reported  that she had another spell- a feeling as if something brushes up on her scalp, or behind her head.  Since the patient has a long-standing bruit that she can hear at night and that bothers her( tinnitus) I have also suggested to do an MRA of the brain , as I am concerned that they're tortuous  vessels , which  may be affecting the left frontal lobe perfusion. This could be a trigger for a nocturnal event as described by the patient clinically. Her MRi documented to my surprise exactly that ! An intracerebral  Venous- cavernous malformation. We are now meeting on 09-26-13 to discuss the results and documented the findings. She had more Night terrors the last weeks. The gabapentin gave her diplopia. I suggest now Topiramate, but she reminded me of involuntary weight loss and loss of apetite. I will try a very low dose of KLONOPIN.  The patient should watch her BP and avoid straining , pressure inducing activities.   RV 02-27-14  Discussed EEG results, no epileptiform activity , but frontal sharps noted. These are of unclear significance.  See report. No change in meds.needed  Patient is free to travel to [place] in April.  The patient,  a caucasian right handed female , originally from [place], reports vivid dreams, with fearful frightful content.  Her dreams have let her to act out some of the dream content, she wakes with her arms extended and with palpitations, tachycardia form fright. She kicks and fell out of bed, yelling, calling out . Following  one dream last year,  she injured her face in a fall . She begun covering all sharp edges.  She has polymyalgia rheumatica and was treated with prednisone , gaining weight through this time She rises at 8.30, the alarm wakes her at 8.  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 No childhood sleep disorder history.  Usually a 7 -8 hour sleeper , refreshed.    Soc history : The patient is widowed for 12 years, has adult children and grandchildren.The bruit in her left temple keeps her form sleeping on the side. She has 3 carotid dopplers over 25 years, diagnosed with  tortious vessel.  MRI brain was normal 4 year ago ( Dr. Carloyn Manner)  No MRA was done at the time , I like to rule out a vascular abnormality at the frontal lobe.  08-04-2015 Her night terrors have  responded to the medication, and her spells are rare now. She returned from [place] and had one event which "scared her sister to death "  I have spoken to Dr. Delice Lesch about the possible component of cavernous vascular malformations producing a seizure event. I ordered a 48 hour study but the patient underwent a 72 hour study. This was normal- no seizure activity,  She is doing well on Klonopin. She gained weight on klonopin, (a fact she likes!) She reports vertigo, dizzines with rapid head movementns and bending down, also controlled with Klonopin.  Her eye feel as if pulsating - can this be the AVM? HTN was addressed in the last 2 visits with endocrinology / PCP.  She joked today that her weight has been stabile for so long, she had no excuse for a new wardrobe !  She had no spells for the last 4 month. She needs no refills today.  Interval history from 02/03/2016. [name] reports today that after being almost free of vivid dreams for several month she has returned having some not to the same intensity and not the same frequency as prior to treatment. Some of them are scary they have the character of the night may or or night terror, and she responds was higher heart rate feeling under stress. Klonopin has been working very well at a very low dose for her by now for over 2 years. I would like to continue using Klonopin with the option that if the medication wears off she could increase the dose. She also does not have insomnia when taking Klonopin. She is not fatigued and she is not excessively daytime sleepy, she also does not report that she is groggy in the mornings. She feels stable well-balanced and not at a risk of falling. I will refill her medication today. I also congratulated her to her 88th birthday, which she celebrates in style this coming Saturday.  Travel history from 15 February 2017, [name] has traveled to summer to [place] with some difficulties.  She had no medical  problems however.  She brought me her Klonopin prescription but she had not needed to fill and which is now a year old.  I will refill her Klonopin through [facility] today. She has no complain of grogginess or mental cloudiness in the morning. She has night terrors. She has noted that she is not easily scared , not panicked.   07-26-2017, Kassidie Hendriks is a 88 y.o. female patient for follow up on concerns of cognitive function, memory.  She seems well, is alert and happy. She has a full social life.  She is relieved that the [facility] was normal 26-30 points. Balance is reportedly poor, she is careful.  She continues to report hip pain, and has some night spells- but much better than before Klonopin was initiated.  Was seen by Dr. Einar Gip cardiology for BP variability- he asked  Her to start wearing compression stockings and elevate her feet at night.  She has not always hydrated well, and she is struggling with HTN and orthostatic lightheadedness.   Today 10/05/17:  Ms. Taulbee is an 88 year old female with a history of memory disturbance and night terrors.  She returns today to discuss her balance.  She states that she went to see her primary care after she had an episode while ambulating.  She reports that she was walking with a neighbor.  15 minutes in to her walk she lost sensation in the lower part of her back and in her ankles.  She reports that she felt like she had no control over her balance.  She states that she had to sit down.  She reports after that event she had some mild episodes but since then this has resolved and she has not had any trouble walking.  She states that she went for a walk last night and did not have any symptoms.  She states that she has found if she uses a cane this offers her more stability.  In the past CT of the lumbar spine as well as MRI of the lumbar spine shows stenosis.  She denies any changes with her bowels or bladder.  Denies any numbness or tingling in the  lower extremities.  She returns today for an evaluation.  HISTORY Jacoria Keiffer is a 88 y.o. female patient for follow up on concerns of cognitive function, memory.  She seems well, is alert and happy. She has a full social life.  She is relieved that the [facility] was normal 26-30 points. Balance is reportedly poor, she is careful.  She continues to report hip pain, and has some night spells- but much better than before Klonopin was initiated.  Was seen by Dr. Einar Gip cardiology for BP variability- he asked  Her to start wearing compression stockings and elevate her feet at night. She has not always hydrated well, and she is struggling with HTN and orthostatic lightheadedness.   10-20-2017, Mrs. Hickmon, who is  88 years old reports a sleep walking episode that shook her- she dreamt about a scary situation, left her bed , walked about and finally woke up - realized she had dreamt and is not concerned about repeating this episodes with a risk of injury.  She started on Hydralazine as a new BP medication- could this have contributed/ she has been well controlled on Klonopin, and we will add trazodone tonight.   Interval history from 30 January 2018, I have the pleasure of meeting today with Mr. Jenny Reichmann [employer], this is not an emergency visit but a visit we had on our books for a while.  The patient performed today and a Montreal cognitive assessment and she scored 27 out of 30 points which is an entirely normal result.  Especially her short-term recall is excellent.  She had no trouble with naming objects and doing mathematics or arithmetic.  She had normal attention she has some trouble with visual-spatial crest such as drawing or during the Trail Making Test.  This is unchanged to previous results her Epworth sleepiness score was endorsed at 10 and her fatigue severity score was not endorsed.  Her medication list is available, she is using Klonopin 0.5 mg half tablet by mouth at bedtime. Sleep is  fragmented but she feels its related to bathroom breaks. She reports nocturia 5-6 times, and she drinks water all day. No burning and no foul smelling urine.    RV 01-09-2019, Mrs Neuroth is a meanwhile 88 year old lang time patient  whom I refereed toDr Vertell Limber for pain- Sharla Tankard  is a 88 y.o. female  with essential hypertension and orthostatic hypotension, mild hyperlipidemia, moderate MR and TR, moderate pulmonary hypertension presents here for 6 month follow-up of hypertension and valvular heart disease.  She had back surgery in June 2020 and has not had back pain since then. 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 .   She constantly checks blood pressure and gets worried about elevated blood pressure, I had multiple phone calls in the past. Presently doing well and except for dizziness when she stands up or is on her feet for long period of time, remains asymptomatic, back pain has resolved. Since surgery she had some lightheadedness. Her HTN today is very well controlled, she is wearing compression stockings.   Rv 05-23-2019-  Mrs. Stanfill describes a frightening sleep spell. She must have left her bed at 4 Am on a Sunday, and remembers dreaming that she reached out for something that moved away , could not be reached, but she remembers reaching out over and over-. She woke up having fallen in front of the door, and a corner of a carpet had turned over- she assumed she stumbled. She has no recollection of any of these complex movements for a while. Had none of the previous spells of feeling someone breathing on her.  She does not have ge headaches, only hematoma on the left arm and elbow. No nausea.some neck stiffness, and she has had cervical evaluation with dr. Vertell Limber. No numbness in hands or loss of grip strength. Her bottom hurts, like sitting on her hip bones.    Rv 07-09-2019; I have the pleasure of seeing Laurian  Mitropoulos today and meanwhile 88 year old Caucasian lady of [place] origin whom I follow for parasomnias and arteriovenous malformations.  She has recently had a carotid Doppler study performed with Dr. Einar Gip and I could see her today the results with her she was very relieved to hear that she had no carotid artery stenosis plaque or flow abnormality.  In addition test of October last year she had an echocardiogram which was also in normal limits.  Today's Montreal cognitive assessment shows 26 out of 30 points which is normal, and the patient was actually able to draw a three-dimensional cube something she has not done in the past.  She recalled 5 out of 5 recall words.  So I am pleasantly supplies and she says that she feels more serene and confident since she has been starting to take her regimen upon recommendation by Dr. Forde Dandy Dr.Perini. It is a very expenesive supplement.   01-09-2020, RV  I am meeting today with Mrs. Lyndia Bury and meanwhile 88 year old yes patient of Dr. [name].  She had a repeat MRI angio hot without contrast and an MRI with and without contrast of the brain she have the previously noted cavernous malformation in the left cerebellum and right basal ganglia which has not changed, there is no aneurysm seen, all blood brain vessels seem to be without stenosis.  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  There is mild progressive generalized cortical atrophy which is age related.  So overall there is no acute change or development of a chronic neurodegenerative disease. Her metabolic panels were repeated 11-28-2019, and showed stable GFR 40, Creatinine.   RV : 08-05-2020 patient is here alone.  MOCA today 27/30. Last MOCA 26/30 (completed on 07/09/19).  Reports slight decline in memory, delayed recall- she takes Prevagen and attributes her success to this OTC supplement. Kermit Balo and bad days. She is sleeping  well. Recently moved to an independent living community: [place]. She has had moderate to good control of her parasomnia, and she had improvement in low back pain. Since February she had onset of diplopia and was seen at [facility]. She is in need of her 4 th covid shot. Had 3 times Pfizer.   RV : 3-15-2023_ [name]  is a meanwhile 88 year-old female with concerns of MCI ( never proved to be there) and taking Prevagen, having a life long history of night terrors and interestingly, cavernous AV malformations. She also has seborrhoic keratosis, dx by PA in dermatology office of former Dr Allyson Sabal) and she brought an article about  cutaneous dyseasthesias- but she describes ITCHING- I doubt a psychogenic cause.  Gabapentin can be used to treat this condition. Topical creams ,too. I also recommended baby shampoo.   RV : 06-17-2021:  Mrs. Stettner was seen in the Ed 2 days after our last visit, with confusion. She suffered 3 falls in one week- and had hypotension. She slipped over her oriental rug, she stated. Her son had felt she was not in the best of cognitive state. Each fall happened on the same spot and in daytime ! EMS was called and she was brought to the ED. Her labs show dehydration and anemia.  ED visit on 05/16/2021 9:2:59 PM 88 year old female comes in with chief complaint of multiple falls.  She resides in an independent living facility. Indicates that the fall is not because of syncope whenever she has some dizziness/lightheadedness. From trauma perspective, there is no gross deformities.  Appropriate trauma scans ordered. Son at the bedside later on, APP had a conversation with him.  There is also an added level of confusion. Metabolic, infectious work-up to be initiated.       11-16-2021:  Aigner Horseman is here for follow-up, 88 years-old and looking much improved in comparison to April 2023, no longer pale, fluent speech and no recent fainting,  she report still  some lightheadedness, but no pre syncope.  She can manage to go 120 steps before getting lightheaded.  Dr Einar Gip prescribed midodrin after she had low BP. This has worked for her, PT is going well and she has recently seen Dr. Forde Dandy her primary care physician as well.  The midodrine dose is a 2.5 mg tablet and she received a 90-day supply from Dr. Irven Shelling office.  I think it is fine to use this medication I would even consider for her to cut the tablet in half it is scored on top.  Midodrine will allow her to raise her blood pressure and prevent lightheadedness what we really want to avoid is of course syncope and falls.  If Mrs. Maryella Shivers would still have lightheadedness I would ask her to take a seat but avoid to lay down as she had orthostatic problems when getting up again.  The sleepiness score today was endorsed at 5 out of 24 points, her EEG that she had in this office in April was abnormally slow however it was not appearing medication impaired.  Her EKG had a rate of 90 at the same time and I still wonder if she was severely anemic which may have brought her blood pressure down, her heart rate up and added to a confusion  and lightheadedness.  She reports that she wakes up undesired between 4 and 5 AM but she is also early to bed at around 9 PM altogether she would have 7 to 8 hours of sleep and I think that is sufficient.  If she wants to take a nap she should take a power nap in the afternoon something that is less than an hour long as between 15 and 40 minutes.  This will not take away from her nighttime sleep.  She is continuing to use Klonopin and melatonin and she is on OTC Prevagen.  Her Mini-Mental status examination today was excellent 28 out of 30.  She wrote a sentence she copied an image and she recalled the serial 7 and 2 out of 3 recall words.  I consider this a very good result.  I would not change any of the medications I actually encouraged her to take the midodrine and should she  develop any problem with a true high blood pressure we would start to cut the midodrine in half.          Review of Systems: Out of a complete 14 system review, the patient complains of only the following symptoms, and all other reviewed systems are negative. Acting out dreams, yelling but not leaving the bed. Sleeps 7- 8 hours, nocturia.   Sleep walking-  05-19-2019 first repeat since 01-30-2018 .  Increased anxiety -Klonopin dependent , but this medication was needed to control her parasomnia. Clonazepam-   Concerned about memory- MOCA was 26/ 30 points 2019, she has not mentioned any more problems. MOCA was today 07-09-2019 also 26/ 30 points. MOCA - repeat in 2022.   Status post spinal stenosis surgery- doing well. Need PT for shoulder.      Social History   Socioeconomic History   Marital status: Widowed    Spouse name: Not on file   Number of children: 2   Years of education: HS   Highest education level: Not on file  Occupational History   Occupation: RETIRED    Employer: [employer]  Tobacco Use   Smoking status: Never    Passive exposure: Never   Smokeless tobacco: Never  Vaping Use   Vaping Use: Never used  Substance and Sexual Activity   Alcohol use: No    Alcohol/week: 0.0 standard drinks of alcohol   Drug use: Never   Sexual activity: Not on file  Other Topics Concern   Not on file  Social History Narrative   Patient is widowed.   Patient has two children.   Patient does not drink any caffeine.    Patient has a high school education.   Patient is right-handed.            Social Determinants of Health   Financial Resource Strain: Not on file  Food Insecurity: Not on file  Transportation Needs: Not on file  Physical Activity: Not on file  Stress: Not on file  Social Connections: Not on file  Intimate Partner Violence: Not on file    Family History  Problem Relation Age of Onset   Tuberculosis Mother 73   CVA Father    Stroke Father  49   Hypertension Sister 76   Lung cancer Brother 64   Hypertension Son     Past Medical History:  Diagnosis Date   Arthritis    Complication of anesthesia    Constipation    Depression    GERD (gastroesophageal reflux disease)    Hypertension  Hypothyroidism    Osteoporosis    Pneumonia 2018       Polymyalgia rheumatica ([place]) on Plaquenil, not prednisone/    PONV (postoperative nausea and vomiting)     Past Surgical History:  Procedure Laterality Date   BASAL CELL CARCINOMA EXCISION  2022   on face   COLONOSCOPY W/ POLYPECTOMY     EYE SURGERY     both cataracts   LUMBAR LAMINECTOMY/DECOMPRESSION MICRODISCECTOMY N/A 08/15/2018   Procedure: Lumbar three to Lumbar five Decompressive lumbar laminectomy;  Surgeon: Erline Levine, MD;  Location: [facility];  Service: Neurosurgery;  Laterality: N/A;   NASAL SINUS SURGERY     SHOULDER SURGERY Right 2007   repair   TONSILLECTOMY     TRIGGER FINGER RELEASE  03/19/2011   Procedure:  left middle finger RELEASE TRIGGER FINGER/A-1 PULLEY;  Surgeon: Cammie Sickle., MD;  Location: [facility];  Service: Orthopedics;  Laterality: Right;  Procedure:  Release Right Long and Ring Trigger Fingers, Release Left Long Trigger Finger, Injection Left Long Proximal Phalangeal Joint   TRIGGER FINGER RELEASE Right    Ring finger, middle finger   UPPER GASTROINTESTINAL ENDOSCOPY      Current Outpatient Medications  Medication Sig Dispense Refill   acetaminophen (TYLENOL) 325 MG tablet Take 325 mg by mouth every 6 (six) hours as needed for moderate pain or headache.     Apoaequorin (PREVAGEN) 10 MG CAPS Take 10 mg by mouth daily after breakfast. 30 capsule 0   atorvastatin (LIPITOR) 10 MG tablet Take 10 mg by mouth every evening.     BIOTIN FORTE PO Take 1 capsule by mouth daily.     Calcium Citrate (CITRACAL PO) Take 2 tablets by mouth in the morning and at bedtime.     carvedilol (COREG) 3.125 MG tablet Take 3.125 mg by mouth 2  (two) times daily as needed.     clonazePAM (KLONOPIN) 0.5 MG tablet Take 1 tablet by mouth at bedtime.     denosumab (PROLIA) 60 MG/ML SOSY injection Inject 60 mg into the skin every 6 (six) months.     fish oil-omega-3 fatty acids 1000 MG capsule Take 1 g by mouth daily.      hydroxychloroquine (PLAQUENIL) 200 MG tablet TAKE 1 TABLET DAILY 90 tablet 0   levothyroxine (SYNTHROID, LEVOTHROID) 50 MCG tablet Take 50 mcg by mouth daily before breakfast.      Lifitegrast (XIIDRA OP) Apply 1 drop to eye 2 (two) times daily.     midodrine (PROAMATINE) 2.5 MG tablet Take 1 tablet (2.5 mg total) by mouth 2 (two) times daily as needed. Use it for dizziness.  Please avoid laying down for 4 hours after taking the medication. 180 tablet 1   Multiple Vitamin (MULTIVITAMIN PO) Take 1 tablet by mouth daily.     omeprazole (PRILOSEC) 20 MG capsule Take 20 mg by mouth daily.     OVER THE COUNTER MEDICATION Take 2 tablets by mouth in the morning and at bedtime. Calcium mini     polyethylene glycol (MIRALAX / GLYCOLAX) packet Take 17 g by mouth daily as needed for moderate constipation.      Probiotic Product (PROBIOTIC DAILY PO) Take 1 tablet by mouth daily. Culturelle     Propylene Glycol (SYSTANE BALANCE OP) Apply 1 drop to eye as needed.     No current facility-administered medications for this visit.    Allergies as of 11/16/2021   (No Known Allergies)    Vitals: BP 120/72  Pulse 84   Ht '5\' 4"'$  (1.626 m)   Wt 117 lb (53.1 kg)   BMI 20.08 kg/m  Last Weight:  Wt Readings from Last 1 Encounters:  11/16/21 117 lb (53.1 kg)   Last Height:   Ht Readings from Last 1 Encounters:  11/16/21 '5\' 4"'$  (1.626 m)    Physical exam:  General: The patient is awake, alert and appears not in acute distress. The patient is well groomed. Head: Normocephalic, atraumatic. Neck is supple. Mallampati 3 , retrognathia , neck circumference:14. 25". inches.  Cardiovascular:  Regular rate and rhythm , without  murmurs ,  has a left sided carotid bruit, no distended neck veins. Respiratory: Lungs are clear to auscultation. Skin:  Without evidence of edema, or rash Trunk:  normal posture.  Neurologic exam :    06/17/2021    3:11 PM 08/05/2020    1:06 PM 07/09/2019    3:14 PM 01/09/2019    2:29 PM 01/30/2018    2:49 PM  Montreal Cognitive Assessment   Visuospatial/ Executive (0/5) '2 4 3 2 3  '$ Naming (0/3) '2 3 2 3 3  '$ Attention: Read list of digits (0/2) '1 2 2 1 2  '$ Attention: Read list of letters (0/1) '1 1 1 1 1  '$ Attention: Serial 7 subtraction starting at 100 (0/3) 0 '3 3 3 3  '$ Language: Repeat phrase (0/2) 2 0 '2 2 2  '$ Language : Fluency (0/1) 0 1 0 1 0  Abstraction (0/2) '2 2 2 2 2  '$ Delayed Recall (0/5) '5 5 5 5 5  '$ Orientation (0/6) '5 6 6 6 6  '$ Total '20 27 26 26 27  '$ Adjusted Score (based on education)  27       The patient is awake and alert, oriented to place and time.  Memory see above; we did repeat MOCA.  She reports some delay in word-finding. Auditory processing speed- she felt subjectively delayed last visit - today not at all- she appears to be happier, more confident. What ever Prevagen does or doesn't do , it's not harmful to her-There is a normal attention span & concentration ability.  Speech is fluent with mild hoarseness - and no aphasia .Mood and affect are appropriate. Cranial nerves: Pupils are equal and briskly reactive to light.  Hearing to finger rub intact. Facial sensation intact to fine touch. Facial motor strength is symmetric and tongue and uvula move again in midline.no tremor, no fasciculating.   Motor tone: normal, there is very little muscle mass, no cog wheeling, grip strength and hip flexion, adduction and abduction. He has fallen because she stumbled, she stated.  The hands hurt due to rheumatic condition.This more than anything restricts ROM and fluidity of movements.   Mrs. Ammons gait is still very measured but not displaying difficulties with balance, she can turn with  3.5 steps 180 degrees, she does not have a drift ,no propulsive tendency was noted. She can walk tandem (!) . Her right foot points a bit more outwards. There is normal arm-swing.   She could rise from a seated position in a chair without bracing herself, there is no tremor noted normal arm swing. There is also no focal weakness, focal sensory loss and good coordination. Handwriting and drawing do not reveal any changes Cognitive testing as below was impaired, 20/ 30 on MOCA, 25/ 30 on MMSE . She has Parasomnia with REM BD.  She has had back pain, She underwent L3-4 and L 4-5 severe stenosis surgery-  ASSESSMENT:  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  She has orthostatic hypotension , in a seated position her blood pressure was 143/86 with a heart rate of 109 once she stood up it was 109/65 and her heart rate only went up by 2 bpm.  Her Ed labs suggested she was being dehydrated. No edema, no need for compression stockings.  Mild anemia. Severe increase in creatinine but very high BUN.  Encouraged hydration. Encourage electrolytes, too. Boost as a snack.   She fell because she stumbled and felt confused after that- same location, in daytime, no aura, no loss of awareness. She is back to her normal conversational powers and cognitive skills are reduced by [place], but there is still a good MMSE. She continues on Apoaequorin / Prevagen.   She is continuing to use Klonopin and melatonin and she is on OTC Prevagen.  Her Mini-Mental status examination today was excellent 28 out of 30.  She wrote a sentence she copied an image and she recalled the serial 7 and 2 out of 3 recall words.  I consider this a very good result.  I would not change any of the medications I actually encouraged her to take  the midodrine and should she develop any problem with a true high blood pressure we would start to cut the midodrine in half.      11/16/2021    2:44 PM  MMSE - Mini Mental State Exam  Orientation to time 5  Orientation to Place 5  Registration 3  Attention/ Calculation 5  Recall 2  Language- name 2 objects 2  Language- repeat 1  Language- follow 3 step command 3  Language- read & follow direction 1  Write a sentence 1  Copy design 0  Total score 28        RV every 6 month with me- MMSE next time in April 2024.  Larey Seat, MD       Cc Dr Forde Dandy ,  Dr Einar Gip.  Dr Vertell Limber.

## 2021-11-17 NOTE — Progress Notes (Signed)
High-resolution CT scan of the chest 07/06/2021, comparison 04/05/2018: 1. Subtle findings in the lung bases suggestive of interstitial lung disease, stable compared to the prior study, considered indeterminate for usual interstitial pneumonia (UIP) per current ATS guidelines. Repeat high-resolution chest CT is suggested in 12 months to assess for temporal changes in the appearance of the lung parenchyma. 2. Aortic atherosclerosis, in addition to left main and three-vessel coronary artery disease. Assessment for potential risk factor modification, dietary therapy or pharmacologic therapy may be warranted, if clinically indicated.

## 2021-11-18 DIAGNOSIS — R2689 Other abnormalities of gait and mobility: Secondary | ICD-10-CM | POA: Diagnosis not present

## 2021-11-18 DIAGNOSIS — M6281 Muscle weakness (generalized): Secondary | ICD-10-CM | POA: Diagnosis not present

## 2021-11-23 DIAGNOSIS — R2689 Other abnormalities of gait and mobility: Secondary | ICD-10-CM | POA: Diagnosis not present

## 2021-11-23 DIAGNOSIS — M6281 Muscle weakness (generalized): Secondary | ICD-10-CM | POA: Diagnosis not present

## 2021-11-25 DIAGNOSIS — M6281 Muscle weakness (generalized): Secondary | ICD-10-CM | POA: Diagnosis not present

## 2021-11-25 DIAGNOSIS — R2689 Other abnormalities of gait and mobility: Secondary | ICD-10-CM | POA: Diagnosis not present

## 2021-11-30 DIAGNOSIS — R2689 Other abnormalities of gait and mobility: Secondary | ICD-10-CM | POA: Diagnosis not present

## 2021-11-30 DIAGNOSIS — M6281 Muscle weakness (generalized): Secondary | ICD-10-CM | POA: Diagnosis not present

## 2021-12-02 DIAGNOSIS — M6281 Muscle weakness (generalized): Secondary | ICD-10-CM | POA: Diagnosis not present

## 2021-12-02 DIAGNOSIS — R2689 Other abnormalities of gait and mobility: Secondary | ICD-10-CM | POA: Diagnosis not present

## 2021-12-02 NOTE — Progress Notes (Signed)
Office Visit Note  Patient: Gloria Anderson             Date of Birth: 1933/09/30           MRN: [id number]             PCP: [name], MD Referring: [name], MD Visit Date: 12/16/2021 Occupation: '@GUAROCC'$ @  Subjective:  Medication management  History of Present Illness: Gloria Anderson is a 88 y.o. female with history of polymyalgia rheumatica, osteoarthritis and osteoporosis.  She denies any muscular weakness.  She has been taking hydroxychloroquine 200 mg 1 tablet p.o. daily.  Patient states that she is getting used to living at independent living center now.  She is taking trips with her core residence.  Her blood pressure has been better controlled.  She has been closely followed by Dr. Einar Gip.  She is getting Prolia injections through Dr. Forde Dandy.  Activities of Daily Living:  Patient reports morning stiffness for 0 minutes.   Patient Denies nocturnal pain.  Difficulty dressing/grooming: Denies Difficulty climbing stairs: Denies Difficulty getting out of chair: Denies Difficulty using hands for taps, buttons, cutlery, and/or writing: Denies  Review of Systems  Constitutional:  Positive for fatigue.  HENT:  Negative for mouth sores and mouth dryness.   Eyes:  Positive for dryness.  Respiratory:  Negative for shortness of breath.   Cardiovascular:  Negative for chest pain and palpitations.  Gastrointestinal:  Positive for constipation. Negative for blood in stool and diarrhea.  Endocrine: Negative for increased urination.  Genitourinary:  Positive for nocturia. Negative for involuntary urination.  Musculoskeletal:  Positive for joint pain and joint pain. Negative for gait problem, joint swelling, myalgias, muscle weakness, morning stiffness, muscle tenderness and myalgias.  Skin:  Positive for hair loss. Negative for color change, rash and sensitivity to sunlight.  Allergic/Immunologic: Negative for susceptible to infections.  Neurological:  Positive for  dizziness and headaches.  Hematological:  Negative for swollen glands.  Psychiatric/Behavioral:  Positive for depressed mood. Negative for sleep disturbance. The patient is nervous/anxious.     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[place]) 05/23/2019   Nocturnal seizures ([place]) 05/23/2019   Adult night terrors 01/09/2019   Degenerative lumbar spinal stenosis 08/15/2018   Memory difficulty 07/26/2017   Other parasomnia 07/26/2017   Anemia 06/02/2017   Osteopenia 06/02/2017   Spinal stenosis 06/02/2017   Iron deficiency anemia secondary to inadequate dietary iron intake 08/05/2016   Fatigue associated with anemia 08/05/2016   Primary osteoarthritis of both feet 02/04/2016   Essential hypertension 02/04/2016   PMR (polymyalgia rheumatica) ([place]) 02/03/2016   Osteoarthritis, hand 02/03/2016   Age-related osteoporosis without current pathological fracture 02/03/2016   DDD (degenerative disc disease), lumbar 02/03/2016   High risk medication use 02/03/2016   Parasomnia, organic 08/04/2015   Parasomnia due to medical condition 02/03/2015   Cerebral cavernous malformation type 1 12/04/2013   Night terrors, adult 09/26/2013   HEMORRHOIDS-EXTERNAL 09/19/2009   GERD 09/19/2009   CONSTIPATION 09/19/2009   DYSPHAGIA 09/19/2009   PERSONAL HISTORY OF COLONIC POLYPS 09/19/2009  Past Medical History:  Diagnosis Date   Arthritis    Complication of anesthesia    Constipation    Depression    GERD (gastroesophageal reflux  disease)    Hypertension    Hypothyroidism    Osteoporosis    Pneumonia 2018   Polymyalgia ([place])    Polymyalgia rheumatica (HCC)    PONV (postoperative nausea and vomiting)     Family History  Problem Relation Age of Onset   Tuberculosis Mother 64   CVA Father    Stroke Father 5   Hypertension Sister 18   Lung cancer Brother 15   Hypertension Son    Past Surgical History:  Procedure Laterality Date   BASAL CELL CARCINOMA EXCISION  2022   on face   COLONOSCOPY W/ POLYPECTOMY     EYE SURGERY     both cataracts   LUMBAR LAMINECTOMY/DECOMPRESSION MICRODISCECTOMY N/A 08/15/2018   Procedure: Lumbar three to Lumbar five Decompressive lumbar laminectomy;  Surgeon: Erline Levine, MD;  Location: [facility];  Service: Neurosurgery;  Laterality: N/A;   NASAL SINUS SURGERY     SHOULDER SURGERY Right 2007   repair   TONSILLECTOMY     TRIGGER FINGER RELEASE  03/19/2011   Procedure:  left middle finger RELEASE TRIGGER FINGER/A-1 PULLEY;  Surgeon: Cammie Sickle., MD;  Location: [facility];  Service: Orthopedics;  Laterality: Right;  Procedure:  Release Right Long and Ring Trigger Fingers, Release Left Long Trigger Finger, Injection Left Long Proximal Phalangeal Joint   TRIGGER FINGER RELEASE Right    Ring finger, middle finger   UPPER GASTROINTESTINAL ENDOSCOPY     Social History   Social History Narrative   Patient is widowed.   Patient has two children.   Patient does not drink any caffeine.    Patient has a high school education.   Patient is right-handed.            Immunization History  Administered Date(s) Administered   Fluad Quad(high Dose 65+) 12/07/2021   Influenza Split 12/01/2010, 11/12/2011, 12/13/2011, 12/12/2012, 12/24/2013, 11/30/2019   Influenza, High Dose Seasonal PF 12/07/2015, 12/19/2016, 11/30/2017   Influenza, Quadrivalent, Recombinant, Inj, Pf 10/31/2018   Influenza,inj,Quad PF,6+ Mos 12/24/2013, 11/07/2014   PFIZER Comirnaty(Gray  Top)Covid-19 Tri-Sucrose Vaccine 08/23/2020   PFIZER(Purple Top)SARS-COV-2 Vaccination 03/24/2019, 04/13/2019, 11/29/2019   Pneumococcal Conjugate-13 07/21/2016   Pneumococcal Polysaccharide-23 01/01/2009, 01/12/2013   Td,absorbed, Preservative Free, Adult Use, Lf Unspecified 10/18/2011   Tdap 06/05/2013   Zoster Recombinat (Shingrix) 08/01/2017, 10/10/2017   Zoster, Live 07/22/2010, 08/31/2010, 10/10/2017     Objective: Vital Signs: BP 118/68 (BP Location: Left Arm, Patient Position: Sitting, Cuff Size: Normal)   Pulse 83   Resp 13   Ht '5\' 4"'$  (1.626 m)   Wt 116 lb 12.8 oz (53 kg)   BMI 20.05 kg/m    Physical Exam Vitals and nursing note reviewed.  Constitutional:      Appearance: She is well-developed.  HENT:     Head: Normocephalic and atraumatic.  Eyes:     Conjunctiva/sclera: Conjunctivae normal.  Cardiovascular:     Rate and Rhythm: Normal rate and regular rhythm.     Heart sounds: Normal heart sounds.  Pulmonary:     Effort: Pulmonary effort is normal.     Breath sounds: Normal breath sounds.  Abdominal:     General: Bowel sounds are normal.     Palpations: Abdomen is soft.  Musculoskeletal:     Cervical back: Normal range of motion.  Lymphadenopathy:  Cervical: No cervical adenopathy.  Skin:    General: Skin is warm and dry.     Capillary Refill: Capillary refill takes less than 2 seconds.  Neurological:     Mental Status: She is alert and oriented to person, place, and time.  Psychiatric:        Behavior: Behavior normal.      Musculoskeletal Exam: Cervical spine was in good range of motion.  She had no thoracic or lumbar tenderness.  Shoulder joints, elbow joints, wrist joints, MCPs with good range of motion. PIP and DIP thickening was noted.  Hip joints and knee joints with good range of motion.  She had no tenderness over ankles or MTPs.  CDAI Exam: CDAI Score: -- Patient Global: --; Provider Global: -- Swollen: --; Tender: -- Joint Exam  12/16/2021   No joint exam has been documented for this visit   There is currently no information documented on the homunculus. Go to the Rheumatology activity and complete the homunculus joint exam.  Investigation: No additional findings.  Imaging: No results found.  Recent Labs: Lab Results  Component Value Date   WBC 7.2 05/16/2021   HGB 11.7 (L) 05/16/2021   PLT 171 05/16/2021   NA 138 05/16/2021   K 4.3 05/16/2021   CL 101 05/16/2021   CO2 27 05/16/2021   GLUCOSE 111 (H) 05/16/2021   BUN 34 (H) 05/16/2021   CREATININE 1.67 (H) 05/16/2021   BILITOT 1.6 (H) 05/16/2021   ALKPHOS 43 05/16/2021   AST 37 05/16/2021   ALT 24 05/16/2021   PROT 6.8 05/16/2021   ALBUMIN 4.2 05/16/2021   CALCIUM 10.5 (H) 05/16/2021   GFRAA 48 (L) 08/20/2020    Speciality Comments: PLQ eye exam: 10/19/2021 normal. Dr. Gershon Crane. Follow up in 1 year.    Procedures:  No procedures performed Allergies: Patient has no known allergies.   Assessment / Plan:     Visit Diagnoses: PMR (polymyalgia rheumatica) (HCC)-she had no muscular tenderness or weakness on the examination.  She had no difficulty getting up from the chair.  She has been doing physical therapy and exercises at the independent living center.  I advised her to reduce hydroxychloroquine dose to 200 mg p.o. every other day as her disease has been stable and her GFR is low.  High risk medication use - Plaquenil 200 mg p.o. daily. PLQ eye exam: 10/19/2021.  Her labs from May 16, 2021 CBC showed hemoglobin 11.7 creatinine was 1.67 calcium was mildly elevated.  I advised to get labs through [facility] which will include CBC with differential and CMP with GFR.  We will check labs every 5 months.  Information regarding immunization was placed in the AVS.  History of repair of right rotator cuff-she had good range of motion without discomfort.  Primary osteoarthritis of both hands-she developed Ro PIP and DIP thickening.  No synovitis was noted.   Joint protection was discussed.  Primary osteoarthritis of both feet-she continues to have some discomfort in her feet.  She has been wearing wide shoes to accommodate her toes.  DDD (degenerative disc disease), cervical-she had good range of motion without discomfort.  DDD (degenerative disc disease), lumbar-she did not have any tenderness on the examination.  She has off-and-on discomfort.  Age-related osteoporosis without current pathological fracture - her last bone density was on October 10, 2018.  Left femoral T score was -2.3, BMD 0.591.  She is on Prolia by Dr. Forde Dandy.  DEXA scan has been monitored by Dr. Forde Dandy  Chronic kidney disease, unspecified CKD stage - she is followed by Dr. Royce Macadamia.  Patient states she will be getting labs with Dr. Royce Macadamia soon.  I advised her to get CBC with differential and CMP with GFR.  History of gastroesophageal reflux (GERD)  Other headache syndrome  Night terrors, adult - followed by Dr. Brett Fairy.  Her symptoms have improved.  Sleep walking  Anxiety and depression-she is doing better now.  Gait instability-improved with physical therapy  History of hypertension -her blood pressure was normal today.  She has not had dizzy episodes lately.  Followed by Dr. Einar Gip.  Orders: No orders of the defined types were placed in this encounter.  No orders of the defined types were placed in this encounter.  .  Follow-Up Instructions: Return in about 6 months (around 06/17/2022) for Polymyalgia rheumatica, Osteoarthritis, Osteoporosis.   Bo Merino, MD  Note - This record has been created using Editor, commissioning.  Chart creation errors have been sought, but may not always  have been located. Such creation errors do not reflect on  the standard of medical care.

## 2021-12-07 DIAGNOSIS — M6281 Muscle weakness (generalized): Secondary | ICD-10-CM | POA: Diagnosis not present

## 2021-12-07 DIAGNOSIS — R2689 Other abnormalities of gait and mobility: Secondary | ICD-10-CM | POA: Diagnosis not present

## 2021-12-09 DIAGNOSIS — M6281 Muscle weakness (generalized): Secondary | ICD-10-CM | POA: Diagnosis not present

## 2021-12-09 DIAGNOSIS — R2689 Other abnormalities of gait and mobility: Secondary | ICD-10-CM | POA: Diagnosis not present

## 2021-12-14 DIAGNOSIS — M6281 Muscle weakness (generalized): Secondary | ICD-10-CM | POA: Diagnosis not present

## 2021-12-14 DIAGNOSIS — R2689 Other abnormalities of gait and mobility: Secondary | ICD-10-CM | POA: Diagnosis not present

## 2021-12-16 ENCOUNTER — Encounter: Payer: Self-pay | Admitting: Rheumatology

## 2021-12-16 ENCOUNTER — Ambulatory Visit: Payer: Medicare HMO | Admitting: Rheumatology

## 2021-12-16 ENCOUNTER — Ambulatory Visit: Payer: Medicare HMO | Attending: Rheumatology | Admitting: Rheumatology

## 2021-12-16 VITALS — BP 118/68 | HR 83 | Resp 13 | Ht 64.0 in | Wt 116.8 lb

## 2021-12-16 DIAGNOSIS — Z79899 Other long term (current) drug therapy: Secondary | ICD-10-CM | POA: Diagnosis not present

## 2021-12-16 DIAGNOSIS — F513 Sleepwalking [somnambulism]: Secondary | ICD-10-CM

## 2021-12-16 DIAGNOSIS — F32A Depression, unspecified: Secondary | ICD-10-CM

## 2021-12-16 DIAGNOSIS — M5136 Other intervertebral disc degeneration, lumbar region: Secondary | ICD-10-CM

## 2021-12-16 DIAGNOSIS — N189 Chronic kidney disease, unspecified: Secondary | ICD-10-CM | POA: Diagnosis not present

## 2021-12-16 DIAGNOSIS — F514 Sleep terrors [night terrors]: Secondary | ICD-10-CM

## 2021-12-16 DIAGNOSIS — M353 Polymyalgia rheumatica: Secondary | ICD-10-CM | POA: Diagnosis not present

## 2021-12-16 DIAGNOSIS — M19071 Primary osteoarthritis, right ankle and foot: Secondary | ICD-10-CM | POA: Diagnosis not present

## 2021-12-16 DIAGNOSIS — Z9889 Other specified postprocedural states: Secondary | ICD-10-CM | POA: Diagnosis not present

## 2021-12-16 DIAGNOSIS — M19072 Primary osteoarthritis, left ankle and foot: Secondary | ICD-10-CM

## 2021-12-16 DIAGNOSIS — R2681 Unsteadiness on feet: Secondary | ICD-10-CM

## 2021-12-16 DIAGNOSIS — M19041 Primary osteoarthritis, right hand: Secondary | ICD-10-CM | POA: Diagnosis not present

## 2021-12-16 DIAGNOSIS — G4489 Other headache syndrome: Secondary | ICD-10-CM

## 2021-12-16 DIAGNOSIS — M810 Age-related osteoporosis without current pathological fracture: Secondary | ICD-10-CM | POA: Diagnosis not present

## 2021-12-16 DIAGNOSIS — Z8719 Personal history of other diseases of the digestive system: Secondary | ICD-10-CM | POA: Diagnosis not present

## 2021-12-16 DIAGNOSIS — M5030 Other cervical disc degeneration, unspecified cervical region: Secondary | ICD-10-CM | POA: Diagnosis not present

## 2021-12-16 DIAGNOSIS — F419 Anxiety disorder, unspecified: Secondary | ICD-10-CM

## 2021-12-16 DIAGNOSIS — M51369 Other intervertebral disc degeneration, lumbar region without mention of lumbar back pain or lower extremity pain: Secondary | ICD-10-CM

## 2021-12-16 DIAGNOSIS — M19042 Primary osteoarthritis, left hand: Secondary | ICD-10-CM

## 2021-12-16 DIAGNOSIS — Z8679 Personal history of other diseases of the circulatory system: Secondary | ICD-10-CM

## 2021-12-16 NOTE — Patient Instructions (Signed)
Please decrease the dose of hydroxychloroquine 200 mg, 1 tablet by mouth every other day.    Standing Labs We placed an order today for your standing lab work.   Please have your standing labs drawn in October  Please have your labs drawn 2 weeks prior to your appointment so that the provider can discuss your lab results at your appointment.  Please note that you may see your imaging and lab results in [facility] before we have reviewed them. We will contact you once all results are reviewed. Please allow our office up to 72 hours to thoroughly review all of the results before contacting the office for clarification of your results.  Lab hours are: Monday through Thursday from 1:30 pm-4:30 pm and Friday from 1:30 pm- 4:00 pm  You may experience shorter wait times on Monday, Thursday or Friday afternoons,.   Effective December 28, 2021, new lab hours will be: Monday through Thursday from 8:00 am -12:30 pm and 1:00 pm-5:00 pm and Friday from 8:00 am-12:00 pm.  Please be advised, all patients with office appointments requiring lab work will take precedent over walk-in lab work.   Labs are drawn by [facility]. Please bring your co-pay at the time of your lab draw.  You may receive a bill from [facility] for your lab work.  Please note if you are on Hydroxychloroquine and and an order has been placed for a Hydroxychloroquine level, you will need to have it drawn 4 hours or more after your last dose.  If you wish to have your labs drawn at another location, please call the office 24 hours in advance so we can fax the orders.  The office is located at [address], [place], [place], [place] [address] No appointment is necessary.    If you have any questions regarding directions or hours of operation,  please call [phone].   As a reminder, please drink plenty of water prior to coming for your lab work. Thanks!  Vaccines You are taking a medication(s) that can suppress your immune system.  The  following immunizations are recommended: Flu annually Covid-19  Td/Tdap (tetanus, diphtheria, pertussis) every 10 years Pneumonia (Prevnar 15 then Pneumovax 23 at least 1 year apart.  Alternatively, can take Prevnar 20 without needing additional dose) Shingrix: 2 doses from 4 weeks to 6 months apart  Please check with your PCP to make sure you are up to date.

## 2021-12-22 ENCOUNTER — Telehealth: Payer: Self-pay | Admitting: *Deleted

## 2021-12-22 NOTE — Telephone Encounter (Signed)
Labs received from:[facility]  Drawn on:10/15/2021  Reviewed by:Hazel Sams, PA-C  Labs drawn:CMP, CBC, Lipid, TSH, Free T4, Vitamin D, Hgb A1C  Results:RBC 3.2  Hgb 11.2  MCH 34.7  MCHC 37.0  RDW 11.7  Patient is on PLQ 200 mg po daily.

## 2021-12-26 ENCOUNTER — Other Ambulatory Visit: Payer: Self-pay | Admitting: Physician Assistant

## 2021-12-28 DIAGNOSIS — M6281 Muscle weakness (generalized): Secondary | ICD-10-CM | POA: Diagnosis not present

## 2021-12-28 DIAGNOSIS — M2042 Other hammer toe(s) (acquired), left foot: Secondary | ICD-10-CM | POA: Diagnosis not present

## 2021-12-28 DIAGNOSIS — L84 Corns and callosities: Secondary | ICD-10-CM | POA: Diagnosis not present

## 2021-12-28 DIAGNOSIS — R2689 Other abnormalities of gait and mobility: Secondary | ICD-10-CM | POA: Diagnosis not present

## 2021-12-28 NOTE — Telephone Encounter (Signed)
Next Visit: 05/20/2022  Last Visit: 12/16/2021  Labs: 10/15/2021 RBC 3.2  Hgb 11.2  MCH 34.7  MCHC 37.0  RDW 11.7  Eye exam: 10/19/2021 normal   Current Dose per office note 12/16/2021: Plaquenil 200 mg p.o. daily  DX: PMR (polymyalgia rheumatica)   Last Fill: 09/28/2021   Okay to refill Plaquenil?

## 2021-12-29 DIAGNOSIS — Z79899 Other long term (current) drug therapy: Secondary | ICD-10-CM | POA: Diagnosis not present

## 2021-12-30 LAB — CMP14+EGFR
ALT: 19 IU/L (ref 0–32)
AST: 28 IU/L (ref 0–40)
Albumin/Globulin Ratio: 1.9 (ref 1.2–2.2)
Albumin: 4.5 g/dL (ref 3.7–4.7)
Alkaline Phosphatase: 53 IU/L (ref 44–121)
BUN/Creatinine Ratio: 15 (ref 12–28)
BUN: 21 mg/dL (ref 8–27)
Bilirubin Total: 0.6 mg/dL (ref 0.0–1.2)
CO2: 26 mmol/L (ref 20–29)
Calcium: 9.9 mg/dL (ref 8.7–10.3)
Chloride: 98 mmol/L (ref 96–106)
Creatinine, Ser: 1.36 mg/dL — ABNORMAL HIGH (ref 0.57–1.00)
Globulin, Total: 2.4 g/dL (ref 1.5–4.5)
Glucose: 91 mg/dL (ref 70–99)
Potassium: 4.2 mmol/L (ref 3.5–5.2)
Sodium: 137 mmol/L (ref 134–144)
Total Protein: 6.9 g/dL (ref 6.0–8.5)
eGFR: 38 mL/min/{1.73_m2} — ABNORMAL LOW (ref >59)

## 2021-12-30 LAB — CBC WITH DIFFERENTIAL/PLATELET
Basophils Absolute: 0 10*3/uL (ref 0.0–0.2)
Basos: 1 %
EOS (ABSOLUTE): 0.2 10*3/uL (ref 0.0–0.4)
Eos: 4 %
Hematocrit: 31 % — ABNORMAL LOW (ref 34.0–46.6)
Hemoglobin: 10.5 g/dL — ABNORMAL LOW (ref 11.1–15.9)
Immature Grans (Abs): 0 10*3/uL (ref 0.0–0.1)
Immature Granulocytes: 0 %
Lymphocytes Absolute: 1 10*3/uL (ref 0.7–3.1)
Lymphs: 17 %
MCH: 32.3 pg (ref 26.6–33.0)
MCHC: 33.9 g/dL (ref 31.5–35.7)
MCV: 95 fL (ref 79–97)
Monocytes Absolute: 0.6 10*3/uL (ref 0.1–0.9)
Monocytes: 10 %
Neutrophils Absolute: 3.8 10*3/uL (ref 1.4–7.0)
Neutrophils: 68 %
Platelets: 187 10*3/uL (ref 150–450)
RBC: 3.25 x10E6/uL — ABNORMAL LOW (ref 3.77–5.28)
RDW: 11.9 % (ref 11.7–15.4)
WBC: 5.6 10*3/uL (ref 3.4–10.8)

## 2021-12-30 NOTE — Progress Notes (Signed)
Creatinine is elevated and stable.  Hemoglobin is low and stable.  Please forward results to her PCP.

## 2022-01-04 DIAGNOSIS — M6281 Muscle weakness (generalized): Secondary | ICD-10-CM | POA: Diagnosis not present

## 2022-01-04 DIAGNOSIS — R2689 Other abnormalities of gait and mobility: Secondary | ICD-10-CM | POA: Diagnosis not present

## 2022-01-06 DIAGNOSIS — K219 Gastro-esophageal reflux disease without esophagitis: Secondary | ICD-10-CM | POA: Diagnosis not present

## 2022-01-06 DIAGNOSIS — R7302 Impaired glucose tolerance (oral): Secondary | ICD-10-CM | POA: Diagnosis not present

## 2022-01-06 DIAGNOSIS — I951 Orthostatic hypotension: Secondary | ICD-10-CM | POA: Diagnosis not present

## 2022-01-06 DIAGNOSIS — M810 Age-related osteoporosis without current pathological fracture: Secondary | ICD-10-CM | POA: Diagnosis not present

## 2022-01-06 DIAGNOSIS — I10 Essential (primary) hypertension: Secondary | ICD-10-CM | POA: Diagnosis not present

## 2022-01-06 DIAGNOSIS — N1832 Chronic kidney disease, stage 3b: Secondary | ICD-10-CM | POA: Diagnosis not present

## 2022-01-06 DIAGNOSIS — M353 Polymyalgia rheumatica: Secondary | ICD-10-CM | POA: Diagnosis not present

## 2022-01-06 DIAGNOSIS — M5416 Radiculopathy, lumbar region: Secondary | ICD-10-CM | POA: Diagnosis not present

## 2022-01-06 DIAGNOSIS — I679 Cerebrovascular disease, unspecified: Secondary | ICD-10-CM | POA: Diagnosis not present

## 2022-01-06 DIAGNOSIS — E039 Hypothyroidism, unspecified: Secondary | ICD-10-CM | POA: Diagnosis not present

## 2022-01-06 DIAGNOSIS — E785 Hyperlipidemia, unspecified: Secondary | ICD-10-CM | POA: Diagnosis not present

## 2022-01-06 DIAGNOSIS — I340 Nonrheumatic mitral (valve) insufficiency: Secondary | ICD-10-CM | POA: Diagnosis not present

## 2022-01-06 DIAGNOSIS — N281 Cyst of kidney, acquired: Secondary | ICD-10-CM | POA: Diagnosis not present

## 2022-01-06 DIAGNOSIS — I129 Hypertensive chronic kidney disease with stage 1 through stage 4 chronic kidney disease, or unspecified chronic kidney disease: Secondary | ICD-10-CM | POA: Diagnosis not present

## 2022-01-06 DIAGNOSIS — L84 Corns and callosities: Secondary | ICD-10-CM | POA: Diagnosis not present

## 2022-01-07 ENCOUNTER — Other Ambulatory Visit: Payer: Self-pay | Admitting: Nephrology

## 2022-01-07 DIAGNOSIS — M353 Polymyalgia rheumatica: Secondary | ICD-10-CM

## 2022-01-07 DIAGNOSIS — N1832 Chronic kidney disease, stage 3b: Secondary | ICD-10-CM

## 2022-01-07 DIAGNOSIS — I129 Hypertensive chronic kidney disease with stage 1 through stage 4 chronic kidney disease, or unspecified chronic kidney disease: Secondary | ICD-10-CM

## 2022-01-07 DIAGNOSIS — N281 Cyst of kidney, acquired: Secondary | ICD-10-CM

## 2022-01-07 DIAGNOSIS — K219 Gastro-esophageal reflux disease without esophagitis: Secondary | ICD-10-CM

## 2022-01-11 ENCOUNTER — Other Ambulatory Visit: Payer: Medicare HMO

## 2022-01-11 ENCOUNTER — Telehealth: Payer: Self-pay | Admitting: Rheumatology

## 2022-01-11 DIAGNOSIS — R2689 Other abnormalities of gait and mobility: Secondary | ICD-10-CM | POA: Diagnosis not present

## 2022-01-11 DIAGNOSIS — M6281 Muscle weakness (generalized): Secondary | ICD-10-CM | POA: Diagnosis not present

## 2022-01-11 NOTE — Telephone Encounter (Signed)
Patient came into the office stating she had labs done 2 weeks ago and accidentally told [facility] to send them here instead of her Nephrologist, Dr. Royce Macadamia. Patient would like those results forwarded to [facility], Dr Harrie Jeans.

## 2022-01-11 NOTE — Telephone Encounter (Signed)
Faxed labs to Dr. Harrie Jeans as requested.

## 2022-01-12 ENCOUNTER — Ambulatory Visit
Admission: RE | Admit: 2022-01-12 | Discharge: 2022-01-12 | Disposition: A | Discharge status: Home / Self Care | Payer: Medicare HMO | Source: Ambulatory Visit | Attending: Nephrology | Admitting: Nephrology

## 2022-01-12 DIAGNOSIS — M353 Polymyalgia rheumatica: Secondary | ICD-10-CM

## 2022-01-12 DIAGNOSIS — N1832 Chronic kidney disease, stage 3b: Secondary | ICD-10-CM

## 2022-01-12 DIAGNOSIS — K219 Gastro-esophageal reflux disease without esophagitis: Secondary | ICD-10-CM

## 2022-01-12 DIAGNOSIS — N281 Cyst of kidney, acquired: Secondary | ICD-10-CM | POA: Diagnosis not present

## 2022-01-12 DIAGNOSIS — I129 Hypertensive chronic kidney disease with stage 1 through stage 4 chronic kidney disease, or unspecified chronic kidney disease: Secondary | ICD-10-CM

## 2022-01-14 ENCOUNTER — Other Ambulatory Visit: Payer: Self-pay | Admitting: Cardiology

## 2022-01-18 DIAGNOSIS — M6281 Muscle weakness (generalized): Secondary | ICD-10-CM | POA: Diagnosis not present

## 2022-01-18 DIAGNOSIS — R2689 Other abnormalities of gait and mobility: Secondary | ICD-10-CM | POA: Diagnosis not present

## 2022-01-25 DIAGNOSIS — M6281 Muscle weakness (generalized): Secondary | ICD-10-CM | POA: Diagnosis not present

## 2022-01-25 DIAGNOSIS — R2689 Other abnormalities of gait and mobility: Secondary | ICD-10-CM | POA: Diagnosis not present

## 2022-01-26 DIAGNOSIS — I679 Cerebrovascular disease, unspecified: Secondary | ICD-10-CM | POA: Diagnosis not present

## 2022-01-26 DIAGNOSIS — L84 Corns and callosities: Secondary | ICD-10-CM | POA: Diagnosis not present

## 2022-01-26 DIAGNOSIS — I129 Hypertensive chronic kidney disease with stage 1 through stage 4 chronic kidney disease, or unspecified chronic kidney disease: Secondary | ICD-10-CM | POA: Diagnosis not present

## 2022-01-26 DIAGNOSIS — N1832 Chronic kidney disease, stage 3b: Secondary | ICD-10-CM | POA: Diagnosis not present

## 2022-02-01 DIAGNOSIS — M6281 Muscle weakness (generalized): Secondary | ICD-10-CM | POA: Diagnosis not present

## 2022-02-01 DIAGNOSIS — R2689 Other abnormalities of gait and mobility: Secondary | ICD-10-CM | POA: Diagnosis not present

## 2022-02-08 DIAGNOSIS — R2689 Other abnormalities of gait and mobility: Secondary | ICD-10-CM | POA: Diagnosis not present

## 2022-02-08 DIAGNOSIS — M6281 Muscle weakness (generalized): Secondary | ICD-10-CM | POA: Diagnosis not present

## 2022-02-15 DIAGNOSIS — M6281 Muscle weakness (generalized): Secondary | ICD-10-CM | POA: Diagnosis not present

## 2022-02-15 DIAGNOSIS — R2689 Other abnormalities of gait and mobility: Secondary | ICD-10-CM | POA: Diagnosis not present

## 2022-03-10 DIAGNOSIS — R2689 Other abnormalities of gait and mobility: Secondary | ICD-10-CM | POA: Diagnosis not present

## 2022-03-10 DIAGNOSIS — M6281 Muscle weakness (generalized): Secondary | ICD-10-CM | POA: Diagnosis not present

## 2022-03-16 DIAGNOSIS — M6281 Muscle weakness (generalized): Secondary | ICD-10-CM | POA: Diagnosis not present

## 2022-03-16 DIAGNOSIS — R2689 Other abnormalities of gait and mobility: Secondary | ICD-10-CM | POA: Diagnosis not present

## 2022-03-17 DIAGNOSIS — Z1231 Encounter for screening mammogram for malignant neoplasm of breast: Secondary | ICD-10-CM | POA: Diagnosis not present

## 2022-03-17 DIAGNOSIS — Z01419 Encounter for gynecological examination (general) (routine) without abnormal findings: Secondary | ICD-10-CM | POA: Diagnosis not present

## 2022-03-22 DIAGNOSIS — M6281 Muscle weakness (generalized): Secondary | ICD-10-CM | POA: Diagnosis not present

## 2022-03-22 DIAGNOSIS — R2689 Other abnormalities of gait and mobility: Secondary | ICD-10-CM | POA: Diagnosis not present

## 2022-03-26 ENCOUNTER — Other Ambulatory Visit: Payer: Self-pay | Admitting: Physician Assistant

## 2022-03-26 NOTE — Telephone Encounter (Signed)
Next Visit: 05/20/2022  Last Visit: 12/16/2021  Labs: 12/29/2021 Creatinine is elevated and stable.  Hemoglobin is low and stable.   Eye exam: 10/19/2021   Current Dose per office note on 12/16/2021: Plaquenil 200 mg p.o. daily   DX:PMR (polymyalgia rheumatica)   Last Fill: 12/28/2021  Okay to refill Plaquenil?

## 2022-03-29 DIAGNOSIS — R2689 Other abnormalities of gait and mobility: Secondary | ICD-10-CM | POA: Diagnosis not present

## 2022-03-29 DIAGNOSIS — M6281 Muscle weakness (generalized): Secondary | ICD-10-CM | POA: Diagnosis not present

## 2022-04-07 ENCOUNTER — Encounter: Payer: Self-pay | Admitting: Cardiology

## 2022-04-07 ENCOUNTER — Ambulatory Visit: Payer: Medicare HMO | Admitting: Cardiology

## 2022-04-07 VITALS — BP 132/60 | HR 76 | Resp 16 | Ht 64.0 in | Wt 116.6 lb

## 2022-04-07 DIAGNOSIS — I129 Hypertensive chronic kidney disease with stage 1 through stage 4 chronic kidney disease, or unspecified chronic kidney disease: Secondary | ICD-10-CM | POA: Diagnosis not present

## 2022-04-07 DIAGNOSIS — N1832 Chronic kidney disease, stage 3b: Secondary | ICD-10-CM

## 2022-04-07 DIAGNOSIS — I951 Orthostatic hypotension: Secondary | ICD-10-CM | POA: Diagnosis not present

## 2022-04-07 DIAGNOSIS — E7800 Pure hypercholesterolemia, unspecified: Secondary | ICD-10-CM

## 2022-04-07 DIAGNOSIS — I2510 Atherosclerotic heart disease of native coronary artery without angina pectoris: Secondary | ICD-10-CM | POA: Diagnosis not present

## 2022-04-07 DIAGNOSIS — I10 Essential (primary) hypertension: Secondary | ICD-10-CM

## 2022-04-07 MED ORDER — CARVEDILOL 3.125 MG PO TABS: 3.1250 mg | ORAL_TABLET | Freq: Two times a day (BID) | ORAL | 1 refills | Status: DC | PRN

## 2022-04-07 NOTE — Progress Notes (Signed)
Primary Physician/Referring:  [name], MD  Patient ID: Gloria Anderson, female    DOB: 08-27-1933, 89 y.o.   MRN: [id number]  Chief Complaint  Patient presents with   Supine Hypertension   Orthostatic hypotension   Follow-up    6 months   HPI:    Gloria Anderson  is a 89 y.o. Caucasian female patient with with supine hypertension and orthostatic hypotension, chronic stage 3 CKD, mild hyperlipidemia, moderate MR and TR, moderate pulmonary hypertension.  She is extremely sensitive to blood pressure variations.  She could not tolerate even minimal doses of antihypertensive medications due to marked dizziness and low blood pressure, systolic blood pressure dropping down to 70 mmHg even with carvedilol 3.125 mg twice daily.  She does sleep on a wedge at night due to orthostatic hypertension.  She has been wearing support stockings while she is up on her feet.  Denies chest pain or palpitations.  States that she is asymptomatic and feeling the best that she has in a while.  No dizziness or syncope.  Past Medical History:  Diagnosis Date   Arthritis    Complication of anesthesia    Constipation    Depression    GERD (gastroesophageal reflux disease)    Hypertension    Hypothyroidism    Osteoporosis    Pneumonia 2018   Polymyalgia ([place])    Polymyalgia rheumatica (HCC)    PONV (postoperative nausea and vomiting)     Social History   Tobacco Use   Smoking status: Never    Passive exposure: Never   Smokeless tobacco: Never  Substance Use Topics   Alcohol use: No    Alcohol/week: 0.0 standard drinks of alcohol   ROS  Review of Systems  Cardiovascular:  Negative for chest pain, dyspnea on exertion, leg swelling and palpitations.  Gastrointestinal:  Negative for melena.  Neurological:  Positive for dizziness (occassional).   Objective  Blood pressure 132/60, pulse 76, resp. rate 16, height '5\' 4"'$  (1.626 m), weight 116 lb 9.6 oz (52.9 kg), SpO2 97 %.     04/07/2022     1:50 PM 12/16/2021    2:36 PM 11/16/2021    2:39 PM  Vitals with BMI  Height '5\' 4"'$  '5\' 4"'$  '5\' 4"'$   Weight 116 lbs 10 oz 116 lbs 13 oz 117 lbs  BMI 20 18.84 16.60  Systolic 630 160 109  Diastolic 60 68 72  Pulse 76 83 84    Orthostatic VS for the past 72 hrs (Last 3 readings):  Orthostatic BP Patient Position BP Location Cuff Size Orthostatic Pulse  04/07/22 1402 132/60 Standing Left Arm Normal 87  04/07/22 1401 174/77 Sitting Left Arm Normal 80  04/07/22 1400 188/79 Supine Left Arm Normal 78    Physical Exam Vitals reviewed.  Constitutional:      Appearance: She is well-developed.  Neck:     Vascular: Carotid bruit (Bilateral) present. No JVD.  Cardiovascular:     Rate and Rhythm: Normal rate and regular rhythm.     Pulses: Normal pulses and intact distal pulses.     Heart sounds: Murmur heard.     Midsystolic murmur is present with a grade of 2/6 at the lower right sternal border and apex.  Pulmonary:     Effort: Pulmonary effort is normal. No accessory muscle usage or respiratory distress.     Breath sounds: Normal breath sounds.  Abdominal:     General: There is abdominal bruit.     Palpations: Abdomen is soft.  Laboratory examination:   Recent Labs    05/16/21 1817 12/29/21 1450  NA 138 137  K 4.3 4.2  CL 101 98  CO2 27 26  GLUCOSE 111* 91  BUN 34* 21  CREATININE 1.67* 1.36*  CALCIUM 10.5* 9.9  GFRNONAA 30*  --       Latest Ref Rng & Units 12/29/2021    2:50 PM 05/16/2021    6:17 PM 01/28/2021    3:04 PM  CMP  Glucose 70 - 99 mg/dL 91  111  87   BUN 8 - 27 mg/dL 21  34  30   Creatinine 0.57 - 1.00 mg/dL 1.36  1.67  1.32   Sodium 134 - 144 mmol/L 137  138  142   Potassium 3.5 - 5.2 mmol/L 4.2  4.3  4.6   Chloride 96 - 106 mmol/L 98  101  105   CO2 20 - 29 mmol/L '26  27  29   '$ Calcium 8.7 - 10.3 mg/dL 9.9  10.5  9.9   Total Protein 6.0 - 8.5 g/dL 6.9  6.8  6.9   Total Bilirubin 0.0 - 1.2 mg/dL 0.6  1.6  1.1   Alkaline Phos 44 - 121 IU/L 53  43    AST  0 - 40 IU/L 28  37  31   ALT 0 - 32 IU/L '19  24  25       '$ Latest Ref Rng & Units 12/29/2021    2:50 PM 05/16/2021    6:17 PM 01/28/2021    3:04 PM  CBC  WBC 3.4 - 10.8 x10E3/uL 5.6  7.2  4.9   Hemoglobin 11.1 - 15.9 g/dL 10.5  11.7  10.7   Hematocrit 34.0 - 46.6 % 31.0  32.5  31.5   Platelets 150 - 450 x10E3/uL 187  171  163    External labs:  Labs 06/25/2021:  Hb 11.5/HCT 31.4, platelets 158.  Serum glucose 100 mg, BUN 33, creatinine 1.43, EGFR 36 mL, potassium 4.5.  Magnesium 2.0. Labs 03/11/2021:  Hb 11.6/HCT 33.0, platelets 209.  Normal indicis.  Serum glucose 119, BUN 21, creatinine 1.2, EGFR 42 mL, potassium 4.6.  Magnesium 2.1.  Hb 11.5/HCT 31.4, platelets 158.  Normal indicis. Cholesterol, total 162.000 m 05/26/2020 HDL 79.000 mg 05/26/2020 LDL 74.000 mg 05/26/2020 Triglycerides 43.000 mg 05/26/2020  A1C 4.900 % 05/26/2020  Allergies  No Known Allergies   Final Medications at End of Visit     Current Outpatient Medications:    acetaminophen (TYLENOL) 325 MG tablet, Take 325 mg by mouth every 6 (six) hours as needed for moderate pain or headache., Disp: , Rfl:    Apoaequorin (PREVAGEN) 10 MG CAPS, Take 10 mg by mouth daily after breakfast., Disp: 30 capsule, Rfl: 0   atorvastatin (LIPITOR) 10 MG tablet, Take 10 mg by mouth every evening., Disp: , Rfl:    BIOTIN FORTE PO, Take 1 capsule by mouth daily., Disp: , Rfl:    Calcium Citrate (CITRACAL PO), Take 2 tablets by mouth in the morning and at bedtime., Disp: , Rfl:    clonazePAM (KLONOPIN) 0.5 MG tablet, Take 1 tablet (0.5 mg total) by mouth at bedtime., Disp: 30 tablet, Rfl: 5   denosumab (PROLIA) 60 MG/ML SOSY injection, Inject 60 mg into the skin every 6 (six) months., Disp: , Rfl:    hydroxychloroquine (PLAQUENIL) 200 MG tablet, TAKE 1 TABLET DAILY (Patient taking differently: Take 100 mg by mouth daily.), Disp: 90 tablet, Rfl: 0  levothyroxine (SYNTHROID, LEVOTHROID) 50 MCG tablet, Take 50 mcg by mouth daily  before breakfast. , Disp: , Rfl:    Lifitegrast (XIIDRA OP), Apply 1 drop to eye 2 (two) times daily., Disp: , Rfl:    midodrine (PROAMATINE) 2.5 MG tablet, Take 1 tablet (2.5 mg total) by mouth 2 (two) times daily as needed. Use it for dizziness.  Please avoid laying down for 4 hours after taking the medication., Disp: 180 tablet, Rfl: 1   Multiple Vitamin (MULTIVITAMIN PO), Take 1 tablet by mouth daily., Disp: , Rfl:    omeprazole (PRILOSEC) 20 MG capsule, Take 20 mg by mouth daily., Disp: , Rfl:    OVER THE COUNTER MEDICATION, Take 2 tablets by mouth in the morning and at bedtime. Calcium mini, Disp: , Rfl:    polyethylene glycol (MIRALAX / GLYCOLAX) packet, Take 17 g by mouth daily as needed for moderate constipation. , Disp: , Rfl:    Propylene Glycol (SYSTANE BALANCE OP), Apply 1 drop to eye as needed., Disp: , Rfl:    carvedilol (COREG) 3.125 MG tablet, Take 1 tablet (3.125 mg total) by mouth 2 (two) times daily as needed. For standing BP >150 mm Hg, Disp: 30 tablet, Rfl: 1   Radiology:   High-resolution CT scan of the chest 07/06/2021, comparison 04/05/2018: 1. Subtle findings in the lung bases  suggestive of interstitial lung disease, stable compared to the prior study, considered indeterminate for usual interstitial pneumonia (UIP) per current ATS guidelines. Repeat high-resolution chest CT is suggested in 12 months to assess for temporal changes in the appearance of the lung parenchyma. 2. Aortic atherosclerosis, in addition to left main and three-vessel coronary artery disease. Assessment for potential risk factor modification, dietary therapy or pharmacologic therapy may be warranted, if clinically indicated.  Cardiac Studies:   Exercise Treadmill Stress Test 10/07/2017:  Indication: chest pain The patient exercised on Bruce protocol for  06:19 min. Patient achieved  7.40 METS and reached HR  114 bpm, which is  82 % of maximum age-predicted HR.  Stress test terminated due to fatigue.     Exercise capacity was fair for age. HR Response to Exercise: Appropriate. BP Response to Exercise: Normal resting BP- appropriate response. Chest Pain: none. Arrhythmias: Occasional PVC s. Resting EKG demonstrates Normal sinus rhythm. ST Changes: With peak exercise there was no ST-T changes of ischemia.   Overall Impression:  Submaximal stress test with no ischemic changes. Continue primary/secondary prevention.   Echocardiogram 12/13/2018: Normal LV systolic function with EF 67%. Left ventricle  cavity is normal in size. Normal left ventricular wall  thickness. Normal global wall motion. Diastolic function  could not be assessed due to severity of mitral  regurgitation.  Moderate (Grade II) mitral regurgitation. Moderate tricuspid regurgitation. Moderate pulmonary hypertension. Estimated pulmonary artery systolic pressure is  50 mmHg.  Small circumferential pericardial effusion wth no hemodynamic  compromise.  IVC is dilated with a respiratory response of <50%. Estimated  RA pressure 10-15 mmHg. No significant change compared to previous study on  05/09/2018.  Carotid artery duplex  07/04/2019: No hemodynamically significant arterial disease in the internal carotid artery bilaterally. Antegrade right vertebral artery flow. Antegrade left vertebral artery flow.  Ambulatory cardiac telemetry 5 days (05/12/2021 - 05/17/2021): Predominant underlying rhythm was sinus.  Patient had episodes of supraventricular tachycardia as well as atrial tachycardia which were symptomatic.  Rare PACs and PVCs.  No evidence of atrial fibrillation, high degree AV block, pauses >3 seconds, or ventricular tachycardia.  Longest episode  of atrial tachycardia lasting 4 hours and 45 minutes.  And heart rate 56 bpm, maximum heart rate 2018 bpm.  EKG:    EKG 04/07/2021: Normal sinus rhythm with rate of 77 bpm, poor R progression, probably normal variant.  No evidence of ischemia, normal EKG.  Compared to 10/05/2021, no  significant change.  Assessment     ICD-10-CM   1. Orthostatic hypotension  I95.1 EKG 12-Lead    2. Supine hypertension  I10 carvedilol (COREG) 3.125 MG tablet    3. Coronary artery calcification seen on CAT scan  I25.10     4. Stage 3b chronic kidney disease (HCC)  N18.32     5. Pure hypercholesterolemia  E78.00       Meds ordered this encounter  Medications   carvedilol (COREG) 3.125 MG tablet    Sig: Take 1 tablet (3.125 mg total) by mouth 2 (two) times daily as needed. For standing BP >150 mm Hg    Dispense:  30 tablet    Refill:  1    Medications Discontinued During This Encounter  Medication Reason   fish oil-omega-3 fatty acids 1000 MG capsule    Probiotic Product (PROBIOTIC DAILY PO)    carvedilol (COREG) 3.125 MG tablet Reorder   Recommendations:   Gloria Anderson  is a 89 y.o. Caucasian female patient with with supine hypertension and orthostatic hypotension, chronic stage 3a-b CKD, mild hyperlipidemia, moderate MR and TR, moderate pulmonary hypertension.  She is extremely sensitive to blood pressure variations.  1. Orthostatic hypotension Patient is severely orthostatic hypotensive, dropping greater than 60-70 points.  Advised her that she should only let treatment changes be made if standing blood pressure is >140 to 150 mmHg.  With the present management including midodrine, her physical activity level has improved, she has not had any dizziness, she remains asymptomatic and feeling the best she has in a while.  2. Supine hypertension She is aware that she needs to sleep reclined to avoid the hypotensive effect.  I also advised her to take carvedilol that she is very responsive to on a as needed basis if standing blood pressure is >150 mmHg.  3. Coronary artery calcification seen on CAT scan I reviewed her recently performed coronary calcification noted on the CT scan, she is asymptomatic without chest pain or dyspnea and she is already on a statin with  well-controlled lipids.  Hence no changes were indicated.  She is presently 89 years of age and continues to remain active and exercises 4 days a week.  4. Stage 3b chronic kidney disease ([place]) Stage IIIa-B chronic kidney disease has remained stable, recent labs from external sources reviewed.  She has a complete physical examination set up sometime in April/May.  5. Pure hypercholesterolemia As dictated above lipids under excellent control.  I will see her back in 6 months as she wishes to continue to see me on a 6 monthly basis.   Adrian Prows, MD, [facility] 04/07/2022, 2:37 PM Office: [phone] Fax: [phone] Pager: [phone]

## 2022-04-13 DIAGNOSIS — I2584 Coronary atherosclerosis due to calcified coronary lesion: Secondary | ICD-10-CM | POA: Diagnosis not present

## 2022-04-13 DIAGNOSIS — R7302 Impaired glucose tolerance (oral): Secondary | ICD-10-CM | POA: Diagnosis not present

## 2022-04-13 DIAGNOSIS — N1832 Chronic kidney disease, stage 3b: Secondary | ICD-10-CM | POA: Diagnosis not present

## 2022-04-13 DIAGNOSIS — Z79899 Other long term (current) drug therapy: Secondary | ICD-10-CM | POA: Diagnosis not present

## 2022-04-13 DIAGNOSIS — I071 Rheumatic tricuspid insufficiency: Secondary | ICD-10-CM | POA: Diagnosis not present

## 2022-04-13 DIAGNOSIS — R413 Other amnesia: Secondary | ICD-10-CM | POA: Diagnosis not present

## 2022-04-13 DIAGNOSIS — I129 Hypertensive chronic kidney disease with stage 1 through stage 4 chronic kidney disease, or unspecified chronic kidney disease: Secondary | ICD-10-CM | POA: Diagnosis not present

## 2022-04-13 DIAGNOSIS — E039 Hypothyroidism, unspecified: Secondary | ICD-10-CM | POA: Diagnosis not present

## 2022-04-13 DIAGNOSIS — E785 Hyperlipidemia, unspecified: Secondary | ICD-10-CM | POA: Diagnosis not present

## 2022-04-13 DIAGNOSIS — M810 Age-related osteoporosis without current pathological fracture: Secondary | ICD-10-CM | POA: Diagnosis not present

## 2022-04-13 DIAGNOSIS — Z23 Encounter for immunization: Secondary | ICD-10-CM | POA: Diagnosis not present

## 2022-04-13 DIAGNOSIS — I700 Atherosclerosis of aorta: Secondary | ICD-10-CM | POA: Diagnosis not present

## 2022-04-13 DIAGNOSIS — M353 Polymyalgia rheumatica: Secondary | ICD-10-CM | POA: Diagnosis not present

## 2022-04-13 DIAGNOSIS — D649 Anemia, unspecified: Secondary | ICD-10-CM | POA: Diagnosis not present

## 2022-04-14 DIAGNOSIS — L0292 Furuncle, unspecified: Secondary | ICD-10-CM | POA: Diagnosis not present

## 2022-04-15 ENCOUNTER — Other Ambulatory Visit: Payer: Self-pay | Admitting: Cardiology

## 2022-04-15 DIAGNOSIS — I10 Essential (primary) hypertension: Secondary | ICD-10-CM

## 2022-04-21 NOTE — Progress Notes (Signed)
Office Visit Note  Patient: Gloria Anderson             Date of Birth: 14-Mar-1933           MRN: [id number]             PCP: [name], MD Referring: [name], MD Visit Date: 05/05/2022 Occupation: '@GUAROCC'$ @  Subjective:  Medication management  History of Present Illness: Lindell Semmel is a 89 y.o. female with history of polymyalgia rheumatica, osteoarthritis and osteoporosis.  She denies any increased muscular pain or muscular weakness.  She has been taking hydroxychloroquine 200 mg p.o. daily.  She states she has been getting Prolia injections with Dr. Baldwin Crown office.  She denies any joint discomfort today.  She has been living at the independent living center.  She is concerned that she is losing her memory.  She has been followed by Dr. Brett Fairy.  She denies any joint pain today.    Activities of Daily Living:  Patient reports morning stiffness for 0 minutes.   Patient Denies nocturnal pain.  Difficulty dressing/grooming: Denies Difficulty climbing stairs: Denies Difficulty getting out of chair: Denies Difficulty using hands for taps, buttons, cutlery, and/or writing: Denies  Review of Systems  Constitutional:  Positive for fatigue.  HENT: Negative.  Negative for mouth sores and mouth dryness.   Eyes:  Positive for dryness.  Respiratory: Negative.  Negative for shortness of breath.   Cardiovascular: Negative.  Negative for chest pain and palpitations.  Gastrointestinal:  Positive for constipation. Negative for blood in stool and diarrhea.  Endocrine: Negative.  Negative for increased urination.  Genitourinary: Negative.  Negative for involuntary urination.  Musculoskeletal:  Negative for joint pain, gait problem, joint pain, joint swelling, myalgias, muscle weakness, morning stiffness, muscle tenderness and myalgias.  Skin: Negative.  Negative for color change, rash, hair loss and sensitivity to sunlight.  Allergic/Immunologic: Negative.  Negative for  susceptible to infections.  Neurological:  Positive for dizziness and memory loss.  Hematological: Negative.  Negative for swollen glands.  Psychiatric/Behavioral:  Positive for depressed mood. Negative for sleep disturbance. The patient is nervous/anxious.     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[place]) 05/23/2019   Nocturnal seizures ([place]) 05/23/2019   Adult night terrors 01/09/2019   Degenerative lumbar spinal stenosis 08/15/2018   Memory difficulty 07/26/2017   Other parasomnia 07/26/2017   Anemia 06/02/2017   Osteopenia 06/02/2017   Spinal stenosis 06/02/2017   Iron deficiency anemia secondary to inadequate dietary iron intake 08/05/2016   Fatigue associated with anemia 08/05/2016   Primary osteoarthritis of both feet 02/04/2016   Essential hypertension 02/04/2016   PMR (polymyalgia rheumatica) ([place]) 02/03/2016   Osteoarthritis, hand 02/03/2016   Age-related osteoporosis without current pathological fracture 02/03/2016   DDD (degenerative disc disease), lumbar 02/03/2016   High risk medication use 02/03/2016   Parasomnia, organic 08/04/2015   Parasomnia due to medical condition 02/03/2015   Cerebral cavernous malformation type 1 12/04/2013   Night terrors, adult 09/26/2013   HEMORRHOIDS-EXTERNAL 09/19/2009   GERD 09/19/2009   CONSTIPATION 09/19/2009   DYSPHAGIA  09/19/2009   PERSONAL HISTORY OF COLONIC POLYPS 09/19/2009    Past Medical History:  Diagnosis Date   Arthritis    Complication of anesthesia     Constipation    Depression    GERD (gastroesophageal reflux disease)    Hypertension    Hypothyroidism    Osteoporosis    Pneumonia 2018   Polymyalgia ([place])    Polymyalgia rheumatica (HCC)    PONV (postoperative nausea and vomiting)     Family History  Problem Relation Age of Onset   Tuberculosis Mother 68   CVA Father    Stroke Father 19   Hypertension Sister 19   Lung cancer Brother 77   Hypertension Son    Past Surgical History:  Procedure Laterality Date   BASAL CELL CARCINOMA EXCISION  2022   on face   COLONOSCOPY W/ POLYPECTOMY     EYE SURGERY     both cataracts   LUMBAR LAMINECTOMY/DECOMPRESSION MICRODISCECTOMY N/A 08/15/2018   Procedure: Lumbar three to Lumbar five Decompressive lumbar laminectomy;  Surgeon: Erline Levine, MD;  Location: [place];  Service: Neurosurgery;  Laterality: N/A;   NASAL SINUS SURGERY     SHOULDER SURGERY Right 2007   repair   TONSILLECTOMY     TRIGGER FINGER RELEASE  03/19/2011   Procedure:  left middle finger RELEASE TRIGGER FINGER/A-1 PULLEY;  Surgeon: Cammie Sickle., MD;  Location: [place];  Service: Orthopedics;  Laterality: Right;  Procedure:  Release Right Long and Ring Trigger Fingers, Release Left Long Trigger Finger, Injection Left Long Proximal Phalangeal Joint   TRIGGER FINGER RELEASE Right    Ring finger, middle finger   UPPER GASTROINTESTINAL ENDOSCOPY     Social History   Social History Narrative   Patient is widowed.   Patient has two children.   Patient does not drink any caffeine.    Patient has a high school education.   Patient is right-handed.            Immunization History  Administered Date(s) Administered   Fluad Quad(high Dose 65+) 12/07/2021   Influenza Split 12/01/2010, 11/12/2011, 12/13/2011, 12/12/2012, 12/24/2013, 11/30/2019   Influenza, High Dose Seasonal PF 12/07/2015, 12/19/2016, 11/30/2017   Influenza, Quadrivalent, Recombinant, Inj, Pf 10/31/2018   Influenza,inj,Quad  PF,6+ Mos 12/24/2013, 11/07/2014   PFIZER Comirnaty(Gray Top)Covid-19 Tri-Sucrose Vaccine 08/23/2020   PFIZER(Purple Top)SARS-COV-2 Vaccination 03/24/2019, 04/13/2019, 11/29/2019   Pneumococcal Conjugate-13 07/21/2016   Pneumococcal Polysaccharide-23 01/01/2009, 01/12/2013   Td,absorbed, Preservative Free, Adult Use, Lf Unspecified 10/18/2011   Tdap 06/05/2013   Zoster Recombinat (Shingrix) 08/01/2017, 10/10/2017   Zoster, Live 07/22/2010, 08/31/2010, 10/10/2017     Objective: Vital Signs: BP (!) 164/78 (BP Location: Left Arm, Patient Position: Sitting, Cuff Size: Normal)   Pulse 87   Resp 14   Ht '5\' 4"'$  (1.626 m)   Wt 118 lb (53.5 kg)   BMI 20.25 kg/m    Physical Exam Vitals and nursing note reviewed.  Constitutional:      Appearance: She is well-developed.  HENT:     Head: Normocephalic and atraumatic.  Eyes:     Conjunctiva/sclera: Conjunctivae normal.  Cardiovascular:     Rate and Rhythm: Normal rate and regular rhythm.     Heart sounds: Normal heart sounds.  Pulmonary:     Effort: Pulmonary effort is normal.     Breath sounds: Normal breath sounds.  Abdominal:     General: Bowel sounds are normal.     Palpations: Abdomen is soft.  Musculoskeletal:  Cervical back: Normal range of motion.  Lymphadenopathy:     Cervical: No cervical adenopathy.  Skin:    General: Skin is warm and dry.     Capillary Refill: Capillary refill takes less than 2 seconds.  Neurological:     Mental Status: She is alert and oriented to person, place, and time.  Psychiatric:        Behavior: Behavior normal.      Musculoskeletal Exam: Cervical spine was in good range of motion.  She had no tenderness over thoracic or lumbar spine.  She had no muscular weakness or tenderness.  Shoulder joints, elbows, wrist, MCPs PIPs and DIPs with good range of motion.  PIP and DIP prominence was noted.  Hip joints and knee joints in good range of motion.  She had no tenderness over ankles or  MTPs.  CDAI Exam: CDAI Score: -- Patient Global: --; Provider Global: -- Swollen: --; Tender: -- Joint Exam 05/05/2022   No joint exam has been documented for this visit   There is currently no information documented on the homunculus. Go to the Rheumatology activity and complete the homunculus joint exam.  Investigation: No additional findings.  Imaging: No results found.  Recent Labs: Lab Results  Component Value Date   WBC 5.6 12/29/2021   HGB 10.5 (L) 12/29/2021   PLT 187 12/29/2021   NA 137 12/29/2021   K 4.2 12/29/2021   CL 98 12/29/2021   CO2 26 12/29/2021   GLUCOSE 91 12/29/2021   BUN 21 12/29/2021   CREATININE 1.36 (H) 12/29/2021   BILITOT 0.6 12/29/2021   ALKPHOS 53 12/29/2021   AST 28 12/29/2021   ALT 19 12/29/2021   PROT 6.9 12/29/2021   ALBUMIN 4.5 12/29/2021   CALCIUM 9.9 12/29/2021   GFRAA 48 (L) 08/20/2020    Speciality Comments: PLQ eye exam: 10/19/2021 normal. Dr. Gershon Crane. Follow up in 1 year.    Procedures:  No procedures performed Allergies: Patient has no known allergies.   Assessment / Plan:     Visit Diagnoses: PMR (polymyalgia rheumatica) (HCC)-she had no muscular weakness or tenderness.  She denies any difficulty getting up from the chair or decreasing her arms.  High risk medication use -she is on Plaquenil 200 mg p.o. every other day reduced at the last visit.  Eye exam October 19, 2021 by Dr. Gershon Crane.  Labs obtained on December 29, 2021 were stable with the mild anemia and elevated creatinine.  Will recheck labs today.  History of repair of right rotator cuff-she had good range of motion without discomfort.  Chronic left shoulder pain-she has intermittent pain.  She had good range of motion without discomfort today.  Primary osteoarthritis of both hands-bilateral PIP and DIP thickening was noted.  Joint protection was discussed.  Primary osteoarthritis of both feet-she continues to have some discomfort in her feet.  Proper fitting  shoes were advised.  DDD (degenerative disc disease), cervical-she had good range of motion without discomfort.  DDD (degenerative disc disease), lumbar-she has intermittent discomfort in her lower back.  She had good mobility today.  Age-related osteoporosis without current pathological fracture - her last bone density was on October 10, 2018.  Left femoral T score was -2.3, BMD 0.591.  She is on Prolia by Dr. Forde Dandy.  DEXA scan monitored by Dr. Forde Dandy.  Chronic kidney disease, unspecified CKD stage - Followed by Dr. Royce Macadamia.  History of hypertension - Followed by Dr. Einar Gip.  Blood pressure was elevated today at 164/78.  Repeat  blood pressure was also elevated.  She was advised to monitor blood pressure closely and follow-up with Dr. Einar Gip if her blood pressure stays elevated.  History of gastroesophageal reflux (GERD)  Night terrors, adult - Followed by Dr. Brett Fairy  Sleep walking  Gait instability  Anxiety and depression-she continues to be quite anxious.  Patient states that she is having problems with word finding and memory loss.  She is followed by Dr. Brett Fairy.  Other headache syndrome  Orders: Orders Placed This Encounter  Procedures   CBC with Differential/Platelet   COMPLETE METABOLIC PANEL WITH GFR   No orders of the defined types were placed in this encounter.    Follow-Up Instructions: Return in about 6 months (around 11/05/2022) for PMR, Osteoarthritis.   Bo Merino, MD  Note - This record has been created using Editor, commissioning.  Chart creation errors have been sought, but may not always  have been located. Such creation errors do not reflect on  the standard of medical care.

## 2022-05-05 ENCOUNTER — Encounter: Payer: Self-pay | Admitting: Rheumatology

## 2022-05-05 ENCOUNTER — Other Ambulatory Visit: Payer: Self-pay | Admitting: Neurology

## 2022-05-05 ENCOUNTER — Ambulatory Visit: Payer: Medicare HMO | Attending: Rheumatology | Admitting: Rheumatology

## 2022-05-05 VITALS — BP 164/78 | HR 87 | Resp 14 | Ht 64.0 in | Wt 118.0 lb

## 2022-05-05 DIAGNOSIS — F514 Sleep terrors [night terrors]: Secondary | ICD-10-CM

## 2022-05-05 DIAGNOSIS — M19041 Primary osteoarthritis, right hand: Secondary | ICD-10-CM | POA: Diagnosis not present

## 2022-05-05 DIAGNOSIS — M19071 Primary osteoarthritis, right ankle and foot: Secondary | ICD-10-CM | POA: Diagnosis not present

## 2022-05-05 DIAGNOSIS — M25512 Pain in left shoulder: Secondary | ICD-10-CM

## 2022-05-05 DIAGNOSIS — Z8719 Personal history of other diseases of the digestive system: Secondary | ICD-10-CM

## 2022-05-05 DIAGNOSIS — F419 Anxiety disorder, unspecified: Secondary | ICD-10-CM

## 2022-05-05 DIAGNOSIS — M51369 Other intervertebral disc degeneration, lumbar region without mention of lumbar back pain or lower extremity pain: Secondary | ICD-10-CM

## 2022-05-05 DIAGNOSIS — G4489 Other headache syndrome: Secondary | ICD-10-CM

## 2022-05-05 DIAGNOSIS — Z9889 Other specified postprocedural states: Secondary | ICD-10-CM

## 2022-05-05 DIAGNOSIS — N189 Chronic kidney disease, unspecified: Secondary | ICD-10-CM | POA: Diagnosis not present

## 2022-05-05 DIAGNOSIS — Z8679 Personal history of other diseases of the circulatory system: Secondary | ICD-10-CM | POA: Diagnosis not present

## 2022-05-05 DIAGNOSIS — Z79899 Other long term (current) drug therapy: Secondary | ICD-10-CM | POA: Diagnosis not present

## 2022-05-05 DIAGNOSIS — K5909 Other constipation: Secondary | ICD-10-CM

## 2022-05-05 DIAGNOSIS — M19042 Primary osteoarthritis, left hand: Secondary | ICD-10-CM

## 2022-05-05 DIAGNOSIS — M5136 Other intervertebral disc degeneration, lumbar region: Secondary | ICD-10-CM

## 2022-05-05 DIAGNOSIS — F513 Sleepwalking [somnambulism]: Secondary | ICD-10-CM

## 2022-05-05 DIAGNOSIS — M353 Polymyalgia rheumatica: Secondary | ICD-10-CM

## 2022-05-05 DIAGNOSIS — F32A Depression, unspecified: Secondary | ICD-10-CM

## 2022-05-05 DIAGNOSIS — M19072 Primary osteoarthritis, left ankle and foot: Secondary | ICD-10-CM

## 2022-05-05 DIAGNOSIS — M5030 Other cervical disc degeneration, unspecified cervical region: Secondary | ICD-10-CM | POA: Diagnosis not present

## 2022-05-05 DIAGNOSIS — R2681 Unsteadiness on feet: Secondary | ICD-10-CM

## 2022-05-05 DIAGNOSIS — M810 Age-related osteoporosis without current pathological fracture: Secondary | ICD-10-CM | POA: Diagnosis not present

## 2022-05-05 DIAGNOSIS — G8929 Other chronic pain: Secondary | ICD-10-CM

## 2022-05-05 LAB — COMPLETE METABOLIC PANEL WITH GFR
AG Ratio: 1.6 (calc) (ref 1.0–2.5)
ALT: 18 U/L (ref 6–29)
AST: 26 U/L (ref 10–35)
Albumin: 4.5 g/dL (ref 3.6–5.1)
Alkaline phosphatase (APISO): 39 U/L (ref 37–153)
BUN/Creatinine Ratio: 21 (calc) (ref 6–22)
BUN: 31 mg/dL — ABNORMAL HIGH (ref 7–25)
CO2: 29 mmol/L (ref 20–32)
Calcium: 10.2 mg/dL (ref 8.6–10.4)
Chloride: 104 mmol/L (ref 98–110)
Creat: 1.48 mg/dL — ABNORMAL HIGH (ref 0.60–0.95)
Globulin: 2.8 g/dL (calc) (ref 1.9–3.7)
Glucose, Bld: 99 mg/dL (ref 65–99)
Potassium: 4.4 mmol/L (ref 3.5–5.3)
Sodium: 141 mmol/L (ref 135–146)
Total Bilirubin: 1 mg/dL (ref 0.2–1.2)
Total Protein: 7.3 g/dL (ref 6.1–8.1)
eGFR: 34 mL/min/{1.73_m2} — ABNORMAL LOW (ref >=60)

## 2022-05-05 LAB — CBC WITH DIFFERENTIAL/PLATELET
Absolute Monocytes: 434 cells/uL (ref 200–950)
Basophils Absolute: 20 cells/uL (ref 0–200)
Basophils Relative: 0.4 %
Eosinophils Absolute: 51 cells/uL (ref 15–500)
Eosinophils Relative: 1 %
HCT: 31.9 % — ABNORMAL LOW (ref 35.0–45.0)
Hemoglobin: 10.7 g/dL — ABNORMAL LOW (ref 11.7–15.5)
Lymphs Abs: 1076 cells/uL (ref 850–3900)
MCH: 32.9 pg (ref 27.0–33.0)
MCHC: 33.5 g/dL (ref 32.0–36.0)
MCV: 98.2 fL (ref 80.0–100.0)
MPV: 10.8 fL (ref 7.5–12.5)
Monocytes Relative: 8.5 %
Neutro Abs: 3519 cells/uL (ref 1500–7800)
Neutrophils Relative %: 69 %
Platelets: 174 10*3/uL (ref 140–400)
RBC: 3.25 10*6/uL — ABNORMAL LOW (ref 3.80–5.10)
RDW: 12.5 % (ref 11.0–15.0)
Total Lymphocyte: 21.1 %
WBC: 5.1 10*3/uL (ref 3.8–10.8)

## 2022-05-05 NOTE — Patient Instructions (Addendum)
Standing Labs We placed an order today for your standing lab work.   Please have your standing labs drawn in December  Please have your labs drawn 2 weeks prior to your appointment so that the provider can discuss your lab results at your appointment, if possible.  Please note that you may see your imaging and lab results in [facility] before we have reviewed them. We will contact you once all results are reviewed. Please allow our office up to 72 hours to thoroughly review all of the results before contacting the office for clarification of your results.  WALK-IN LAB HOURS  Monday through Thursday from 8:00 am -12:30 pm and 1:00 pm-5:00 pm and Friday from 8:00 am-12:00 pm.  Patients with office visits requiring labs will be seen before walk-in labs.  You may encounter longer than normal wait times. Please allow additional time. Wait times may be shorter on  Monday and Thursday afternoons.  We do not book appointments for walk-in labs. We appreciate your patience and understanding with our staff.   Labs are drawn by [facility]. Please bring your co-pay at the time of your lab draw.  You may receive a bill from [facility] for your lab work.  Please note if you are on Hydroxychloroquine and and an order has been placed for a Hydroxychloroquine level,  you will need to have it drawn 4 hours or more after your last dose.  If you wish to have your labs drawn at another location, please call the office 24 hours in advance so we can fax the orders.  The office is located at [address] [place], [place], [place] [address]   If you have any questions regarding directions or hours of operation,  please call [phone].   As a reminder, please drink plenty of water prior to coming for your lab work. Thanks!   Vaccines You are taking a medication(s) that can suppress your immune system.  The following immunizations are recommended: Flu annually Covid-19  Td/Tdap (tetanus, diphtheria, pertussis)  every 10 years Pneumonia (Prevnar 15 then Pneumovax 23 at least 1 year apart.  Alternatively, can take Prevnar 20 without needing additional dose) Shingrix: 2 doses from 4 weeks to 6 months apart  Please check with your PCP to make sure you are up to date.

## 2022-05-06 NOTE — Progress Notes (Signed)
GFR is low and is stable.  Hemoglobin is low and stable.  Rest of the labs are normal.  Please forward results to her PCP.

## 2022-05-10 ENCOUNTER — Encounter: Payer: Self-pay | Admitting: Neurology

## 2022-05-10 ENCOUNTER — Ambulatory Visit: Payer: Medicare HMO | Admitting: Neurology

## 2022-05-10 VITALS — BP 133/69 | HR 81 | Ht 63.0 in | Wt 119.0 lb

## 2022-05-10 DIAGNOSIS — R569 Unspecified convulsions: Secondary | ICD-10-CM

## 2022-05-10 DIAGNOSIS — I951 Orthostatic hypotension: Secondary | ICD-10-CM

## 2022-05-10 DIAGNOSIS — R208 Other disturbances of skin sensation: Secondary | ICD-10-CM | POA: Diagnosis not present

## 2022-05-10 DIAGNOSIS — Q283 Other malformations of cerebral vessels: Secondary | ICD-10-CM

## 2022-05-10 DIAGNOSIS — R419 Unspecified symptoms and signs involving cognitive functions and awareness: Secondary | ICD-10-CM

## 2022-05-10 NOTE — Progress Notes (Signed)
Provider:  Larey Seat, MD  Primary Care Physician:  [name], MD [place] [address]     Referring Provider: [name], [place],  [place] [address]          Chief Complaint according to patient   Patient presents with:     New Patient (Initial Visit)           HISTORY OF PRESENT ILLNESS:  Gloria Anderson is a 89 y.o. female patient who is here for revisit 05/10/2022 for  sleep disturbances. .  Chief concern according to patient :  I am waking up and just can' go back to sleep"    05-10-2022:  We have originally followed Gloria Anderson for her parasomnia disorder which sometimes had quite vivid expression.  She felt the presence of movement in the bedroom, she had dysesthesias of the scalp complained about a year ago and she was concerned about cognitive decline.  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  Her sleep diagnosis is adult night terrors.  She was asked today to perform again a cognitive test and a Mini-Mental status examination was chosen.  She scored 28 out of 30 points. She has had lightheadedness spells. No falls since 10-2021. Dr Einar Gip sees her as well.  The patient is using Klonopin at bedtime to suppress parasomnia, she has to continue taking Plaquenil but the dose was reduced from 200 to 100 mg. This treats her polymyalgia rheumatica.  She is continue to use Synthroid.  She uses Prilosec Lipitor and Coreg.   Blood pressure here today was also taken orthostatically in a seated position her blood pressure was 133/69 with a heart rate regular at 81 bpm. In a standing position her blood pressure was 103/56 with a heart rate of 88 regular. In supine resting position her blood pressure was 157/71 with a heart rate of 83.  What this tells me is that her heart rate does not adjust to the blood pressure.  And that her standing blood pressure is severely  orthostatic.  12-16-21 ; dr Estanislado Pandy :Visit Diagnoses: PMR (polymyalgia rheumatica) (HCC)-she had no muscular tenderness or weakness on the examination.  She had no difficulty getting up from the chair.  She has been doing physical therapy and exercises at the independent living center.  I advised her to reduce hydroxychloroquine dose to 200 mg p.o. every other day as her disease has been stable and her GFR is low.    . Falling asleep well.   11-16-2021: This patient of Dr. 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 She tolerates Klonopin and is happy with the REM sleep control. She has developed higher blood pressures spurs of higher blood pressures that Dr. Forde Dandy and Dr. Einar Gip have followed, and initiated treatment- she developed orthostatic BP!Marland Kitchen She was found to be anemic, sleepy and fatigued. Has normal B 12 and started oral iron. She has a history of PMR. Polymyalgia rheumatica. She had a normal colonoscopy in 2016. Had lost weight , but recovered . Gloria Anderson brought her last laboratory results with her, white blood cell count was 3.74K, red blood cell count was 2.9, hemoglobin 9.8 g, hematocrit 28.4%, corpuscular volume 97.1 MCH 33.5. She was over the phone prescribed Nu-iron to take as a supplement. Needs TIBC, Total iron deficiency,  08-05-2020 Gloria Anderson is a patient of Dr. Forde Dandy who  came in originally for an evaluation of possible night terrors; within the work up intracerebral AVMs were discovered and the possibility of nocturnal seizures addressed. She tolerates Klonopin and is happy with the REM sleep control. She has developed higher blood pressures spurs of higher blood pressures that Dr. Forde Dandy and Dr. Einar Gip have followed, and initiated treatment- she developed orthostatic BP!Marland Kitchen She was found to be anemic, sleepy and fatigued. Has normal B 12  and started oral iron. She has a history of PMR. Polymyalgia rheumatica. She had a normal colonoscopy in 2016. Had lost weight , but recovered . Gloria Anderson brought her last laboratory results with her, white blood cell count was 3.74K, red blood cell count was 2.9, hemoglobin 9.8 g, hematocrit 28.4%, corpuscular volume 97.1 MCH 33.5. She was over the phone prescribed Nu-iron to take as a supplement. Needs TIBC, Total iron deficiency,    09-26-13- The patient's sleep study from 08-05-13 revealed no apnea, no oxygen desaturation, no irregular heart beats, and only took clusters of limb movements that seem to be related to discomfort with sleeping on the left side. She had no periodic limb movements during REM sleep. There was a prolonged period of slow-wave sleep at around 2 AM during which nor night terrors sleep walking or sleep talking was ordered. The patient reported  that she had another spell- a feeling as if something brushes up on her scalp, or behind her head.  Since the patient has a long-standing bruit that she can hear at night and that bothers her( tinnitus) I have also suggested to do an MRA of the brain , as I am concerned that they're tortuous vessels , which  may be affecting the left frontal lobe perfusion. This could be a trigger for a nocturnal event as described by the patient clinically. Her MRi documented to my surprise exactly that ! An intracerebral  Venous- cavernous malformation. We are now meeting on 09-26-13 to discuss the results and documented the findings. She had more Night terrors the last weeks. The gabapentin gave her diplopia. I suggest now Topiramate, but she reminded me of involuntary weight loss and loss of apetite. I will try a very low dose of KLONOPIN.  The patient should watch her BP and avoid straining , pressure inducing activities.     Review of Systems: Out of a complete 14 system review, the patient complains of only the following symptoms, and all other  reviewed systems are negative.:   Lightheadedness. Orthostatic.  Muscle weakness, in PT. Followed in rheumatology.  Subjective cognitive complaints.     How likely are you to doze in the following situations: 0 = not likely, 1 = slight chance, 2 = moderate chance, 3 = high chance   Sitting and Reading? Watching Television? Sitting inactive in a public place (theater or meeting)? As a passenger in a car for an hour without a break? Lying down in the afternoon when circumstances permit? Sitting and talking to someone? Sitting quietly after lunch without alcohol? In a car, while stopped for a few minutes in traffic?   Total = XXX/ 24 points   FSS endorsed at XXX/ 63 points.   Social History   Socioeconomic History   Marital status: Widowed    Spouse name: Not on file   Number of children: 2   Years of education: HS   Highest education level: Not on file  Occupational History   Occupation: RETIRED  Employer: [employer]  Tobacco Use   Smoking status: Never    Passive exposure: Never   Smokeless tobacco: Never  Vaping Use   Vaping Use: Never used  Substance and Sexual Activity   Alcohol use: No    Alcohol/week: 0.0 standard drinks of alcohol   Drug use: Never   Sexual activity: Not on file  Other Topics Concern   Not on file  Social History Narrative   Patient is widowed.   Patient has two children.   Patient does not drink any caffeine.    Patient has a high school education.   Patient is right-handed.            Social Determinants of Health   Financial Resource Strain: Not on file  Food Insecurity: Not on file  Transportation Needs: Not on file  Physical Activity: Not on file  Stress: Not on file  Social Connections: Not on file    Family History  Problem Relation Age of Onset   Tuberculosis Mother 75   CVA Father    Stroke Father 42   Hypertension Sister 25   Lung cancer Brother 9   Hypertension Son     Past Medical History:  Diagnosis  Date   Arthritis    Complication of anesthesia    Constipation    Depression    GERD (gastroesophageal reflux disease)    Hypertension    Hypothyroidism    Osteoporosis    Pneumonia 2018   Polymyalgia ([facility])    Polymyalgia rheumatica (HCC)    PONV (postoperative nausea and vomiting)     Past Surgical History:  Procedure Laterality Date   BASAL CELL CARCINOMA EXCISION  2022   on face   COLONOSCOPY W/ POLYPECTOMY     EYE SURGERY     both cataracts   LUMBAR LAMINECTOMY/DECOMPRESSION MICRODISCECTOMY N/A 08/15/2018   Procedure: Lumbar three to Lumbar five Decompressive lumbar laminectomy;  Surgeon: Erline Levine, MD;  Location: [facility];  Service: Neurosurgery;  Laterality: N/A;   NASAL SINUS SURGERY     SHOULDER SURGERY Right 2007   repair   TONSILLECTOMY     TRIGGER FINGER RELEASE  03/19/2011   Procedure:  left middle finger RELEASE TRIGGER FINGER/A-1 PULLEY;  Surgeon: Cammie Sickle., MD;  Location: [facility];  Service: Orthopedics;  Laterality: Right;  Procedure:  Release Right Long and Ring Trigger Fingers, Release Left Long Trigger Finger, Injection Left Long Proximal Phalangeal Joint   TRIGGER FINGER RELEASE Right    Ring finger, middle finger   UPPER GASTROINTESTINAL ENDOSCOPY        DIAGNOSTIC DATA (LABS, IMAGING, TESTING) - I reviewed patient records, labs, notes, testing and imaging myself where available.  Lab Results  Component Value Date   WBC 5.1 05/05/2022   HGB 10.7 (L) 05/05/2022   HCT 31.9 (L) 05/05/2022   MCV 98.2 05/05/2022   PLT 174 05/05/2022      Component Value Date/Time   NA 141 05/05/2022 1201   NA 137 12/29/2021 1450   K 4.4 05/05/2022 1201   CL 104 05/05/2022 1201   CO2 29 05/05/2022 1201   GLUCOSE 99 05/05/2022 1201   BUN 31 (H) 05/05/2022 1201   BUN 21 12/29/2021 1450   CREATININE 1.48 (H) 05/05/2022 1201   CALCIUM 10.2 05/05/2022 1201   PROT 7.3 05/05/2022 1201   PROT 6.9 12/29/2021 1450   ALBUMIN 4.5 12/29/2021  1450   AST 26 05/05/2022 1201   ALT 18 05/05/2022  1201   ALKPHOS 53 12/29/2021 1450   BILITOT 1.0 05/05/2022 1201   BILITOT 0.6 12/29/2021 1450   GFRNONAA 30 (L) 05/16/2021 1817   GFRNONAA 41 (L) 08/20/2020 1544   GFRAA 48 (L) 08/20/2020 1544   No results found for: "CHOL", "HDL", "LDLCALC", "LDLDIRECT", "TRIG", "CHOLHDL" No results found for: "HGBA1C" Lab Results  Component Value Date   VITAMINB12 1,421 (H) 05/17/2021   Lab Results  Component Value Date   TSH 1.243 05/17/2021              Component Ref Range & Units 5 d ago (05/05/22) 4 mo ago (12/29/21) 11 mo ago (05/16/21) 1 yr ago (01/28/21) 1 yr ago (08/20/20) 2 yr ago (03/21/20) 2 yr ago (11/28/19)  Glucose, Bld 65 - 99 mg/dL 99 91 R 111 High  R, CM 87 CM 128 High  CM 94 CM 86 CM  Comment: .            Fasting reference interval .  BUN 7 - 25 mg/dL 31 High  21 R 34 High  R 30 High  22 27 High  21  Creat 0.60 - 0.95 mg/dL 1.48 High  1.36 High  R 1.67 High  R 1.32 High  1.21 High  R, CM 1.09 High  R, CM 1.24 High  R, CM  eGFR > OR = 60 mL/min/1.68m 34 Low  38 Low  R  40 Low  CM     BUN/Creatinine Ratio 6 - 22 (calc) 21 15 R  23 High  18 25 High  17  Sodium 135 - 146 mmol/L 141 137 R 138 R 142 138 139 139  Potassium 3.5 - 5.3 mmol/L 4.4 4.2 R 4.3 R 4.6 4.7 4.5 4.3  Chloride 98 - 110 mmol/L 104 98 R 101 R 105 100 103 102  CO2 20 - 32 mmol/L 29 26 R 27 R '29 28 29 29  '$ Calcium 8.6 - 10.4 mg/dL 10.2 9.9 R 10.5 High  R 9.9 9.6 9.7 10.0  Total Protein 6.1 - 8.1 g/dL 7.3 6.9 R 6.8 R 6.9 6.8 6.8 6.9  Albumin 3.6 - 5.1 g/dL 4.5   4.3 4.3 4.5 4.5  Globulin 1.9 - 3.7 g/dL (calc) 2.8   2.6 2.5 2.3 2.4  AG Ratio 1.0 - 2.5 (calc) 1.6   1.7 1.7 2.0 1.9  Total Bilirubin 0.2 - 1.2 mg/dL 1.0 0.6 R 1.6 High  R 1.1 1.0 1.2 1.4 High   Alkaline phosphatase (APISO) 37 - 153 U/L 39   48 43 40 41  AST 10 - 35 U/L 26 28 R 37 R '31 29 25 24  '$ ALT 6 - 29 U/L 18 19 R 24 R 25         PHYSICAL EXAM:  Today's Vitals   05/10/22 1444  BP: 133/69   Pulse: 81  Weight: 119 lb (54 kg)  Height: '5\' 3"'$  (1.6 m)   Body mass index is 21.08 kg/m.   Wt Readings from Last 3 Encounters:  05/10/22 119 lb (54 kg)  05/05/22 118 lb (53.5 kg)  04/07/22 116 lb 9.6 oz (52.9 kg)     Ht Readings from Last 3 Encounters:  05/10/22 '5\' 3"'$  (1.6 m)  05/05/22 '5\' 4"'$  (1.626 m)  04/07/22 '5\' 4"'$  (1.626 m)      he patient is awake and alert, oriented to place and time.   Memory see above; we did repeat MOCA.     06/17/2021    3:11  PM 08/05/2020    1:06 PM 07/09/2019    3:14 PM 01/09/2019    2:29 PM 01/30/2018    2:49 PM  Montreal Cognitive Assessment   Visuospatial/ Executive (0/5) '2 4 3 2 3  '$ Naming (0/3) '2 3 2 3 3  '$ Attention: Read list of digits (0/2) '1 2 2 1 2  '$ Attention: Read list of letters (0/1) '1 1 1 1 1  '$ Attention: Serial 7 subtraction starting at 100 (0/3) 0 '3 3 3 3  '$ Language: Repeat phrase (0/2) 2 0 '2 2 2  '$ Language : Fluency (0/1) 0 1 0 1 0  Abstraction (0/2) '2 2 2 2 2  '$ Delayed Recall (0/5) '5 5 5 5 5  '$ Orientation (0/6) '5 6 6 6 6  '$ Total '20 27 26 26 27  '$ Adjusted Score (based on education)  27       mini-mental status exam    05/10/2022    2:53 PM 11/16/2021    2:44 PM  MMSE - Mini Mental State Exam  Orientation to time 5 5  Orientation to Place 5 5  Registration 3 3  Attention/ Calculation 4 5  Recall 3 2  Language- name 2 objects 2 2  Language- repeat 1 1  Language- follow 3 step command 3 3  Language- read & follow direction 1 1  Write a sentence 1 1  Copy design 0 0  Total score 28 28      She reports some delay in word-finding. Auditory processing speed- she felt subjectively delayed last visit - today not at all- she appears to be happier, more confident. What ever Prevagen does or doesn't do , it's not harmful to her-There is a normal attention span & concentration ability.  Speech is fluent with mild hoarseness - and no aphasia .Mood and affect are appropriate. Cranial nerves: Pupils are equal and briskly reactive to  light.  Hearing to finger rub intact. Facial sensation intact to fine touch. Facial motor strength is symmetric and tongue and uvula move again in midline.no tremor, no fasciculating.    Motor tone: normal, there is very little muscle mass, no cog wheeling, grip strength and hip flexion, adduction and abduction. He has fallen because she stumbled, she stated.  The hands hurt due to rheumatic condition.This more than anything restricts ROM and fluidity of movements.   Gloria Anderson gait is still very measured but not displaying difficulties with balance, she can turn with 3.5 steps 180 degrees, she does not have a drift ,no propulsive tendency was noted. She can walk tandem (!) . Her right foot points a bit more outwards. There is normal arm-swing.    She could rise from a seated position in a chair without bracing herself, there is no tremor noted normal arm swing. There is also no focal weakness, focal sensory loss and good coordination. Handwriting and drawing do not reveal any changes Cognitive testing as below was impaired, 20/ 30 on MOCA, 25/ 30 on MMSE . She has Parasomnia with REM BD.  She has had back pain, She underwent L3-4 and L 4-5 severe stenosis surgery-   ASSESSMENT AND PLAN 89 y.o. year old female of [place] ancestry presents here for scheduled q 6 month here with:    1) chronic dehydration. Drink 2 glasses with every meal. This will help Orthostatis and will help to reduce your creatinine. Exercise . Move !! Drink water.   2) MMSE was great - 28/ 30 points. This time a MMSE , last  visit we had switched from [facility] to MMSE - after a sudden drop in points.  3) Klonopin was reduced but this may have caused the patient s early wakefulness and inability to return to sleep. I like to not bring the dose up again.  She takes Melatonin.    I plan to follow up either personally or through our NP within 6-8 months. MOCA .   I would like to thank Dr Einar Gip, Dr Estanislado Pandy  and [name], [address] [place],  [place] [address] for allowing me to meet with and to take care of this pleasant patient.    After spending a total time of  36  minutes face to face and additional time for physical and neurologic examination, review of laboratory studies,  personal review of imaging studies, reports and results of other testing and review of referral information / records as far as provided in visit,   Electronically signed by: Larey Seat, MD 05/10/2022 3:20 PM  [facility] and [facility] Board certified by The [employer] of Sleep Medicine and Diplomate of the [employer] of Sleep Medicine. Board certified In Neurology through the [employer], Fellow of the [employer] of Neurology. Medical Director of [facility].

## 2022-05-10 NOTE — Patient Instructions (Signed)
Dehydration, Adult Dehydration is a condition in which there is not enough water or other fluids in the body. This happens when a person loses more fluids than they take in. Important organs cannot work right without the right amount of fluids. Any loss of fluids from the body can cause dehydration. Dehydration can be mild, worse, or very bad. It should be treated right away to keep it from getting very bad. What are the causes? Conditions that cause loss of water in the body. They include: Watery poop (diarrhea). Vomiting. Sweating a lot. Fever. Infection. Peeing (urinating) a lot. Not drinking enough fluids. Certain medicines, such as medicines that take extra fluid out of the body (diuretics). Lack of safe drinking water. Not being able to get enough water and food. What increases the risk? Having a long-term (chronic) illness that has not been treated the right way, such as: Diabetes. Heart disease. Kidney disease. Being 65 years of age or older. Having a disability. Living in a place that is high above the ground or sea (high in altitude). The thinner, drier air causes more fluid loss. Doing exercises that put stress on your body for a long time. Being active when in hot places. What are the signs or symptoms? Symptoms of dehydration depend on how bad it is. Mild or worse dehydration Thirst. Dry lips or dry mouth. Feeling dizzy or light-headed. Muscle cramps. Passing little pee or dark pee. Pee may be the color of tea. Headache. Very bad dehydration Changes in skin. Skin may: Be cold to the touch (clammy). Be blotchy or pale. Not go back to normal right after you pinch it and let it go. Little or no tears, pee, or sweat. Fast breathing. Low blood pressure. Weak pulse. Pulse that is more than 100 beats a minute when you are sitting still. Other changes, such as: Feeling very thirsty. Eyes that look hollow (sunken). Cold hands and feet. Being confused. Being very  tired (lethargic) or having trouble waking from sleep. Losing weight. Loss of consciousness. How is this treated? Treatment for this condition depends on how bad your dehydration is. Treatment should start right away. Do not wait until your condition gets very bad. Very bad dehydration is an emergency. You will need to go to a hospital. Mild or worse dehydration can be treated at home. You may be asked to: Drink more fluids. Drink an oral rehydration solution (ORS). This drink gives you the right amount of fluids, salts, and minerals (electrolytes). Very bad dehydration can be treated: With fluids through an IV tube. By correcting low levels of electrolytes in the body. By treating the problem that caused your dehydration. Follow these instructions at home: Oral rehydration solution If told by your doctor, drink an ORS: Make an ORS. Use instructions on the package. Start by drinking small amounts, about  cup (120 mL) every 5-10 minutes. Slowly drink more until you have had the amount that your doctor said to have.  Eating and drinking  Drink enough clear fluid to keep your pee pale yellow. If you were told to drink an ORS, finish the ORS first. Then, start slowly drinking other clear fluids. Drink fluids such as: Water. Do not drink only water. Doing that can make the salt (sodium) level in your body get too low. Water from ice chips you suck on. Fruit juice that you have added water to (diluted). Low-calorie sports drinks. Eat foods that have the right amounts of salts and minerals, such as bananas, oranges, potatoes,   tomatoes, or spinach. Do not drink alcohol. Avoid drinks that have caffeine or sugar. These include:: High-calorie sports drinks. Fruit juice that you did not add water to. Soda. Coffee or energy drinks. Avoid foods that are greasy or have a lot of fat or sugar. General instructions Take over-the-counter and prescription medicines only as told by your doctor. Do  not take sodium tablets. Doing that can make the salt level in your body get too high. Return to your normal activities as told by your doctor. Ask your doctor what activities are safe for you. Keep all follow-up visits. Your doctor may check and change your treatment. Contact a doctor if: You have pain in your belly (abdomen) and the pain: Gets worse. Stays in one place. You have a rash. You have a stiff neck. You get angry or annoyed more easily than normal. You are more tired or have a harder time waking than normal. You feel weak or dizzy. You feel very thirsty. Get help right away if: You have any symptoms of very bad dehydration. You vomit every time you eat or drink. Your vomiting gets worse, does not go away, or you vomit blood or green stuff. You are getting treatment, but symptoms are getting worse. You have a fever. You have a very bad headache. You have: Diarrhea that gets worse or does not go away. Blood in your poop (stool). This may cause poop to look black and tarry. No pee in 6-8 hours. Only a small amount of pee in 6-8 hours, and the pee is very dark. You have trouble breathing. These symptoms may be an emergency. Get help right away. Call 911. Do not wait to see if the symptoms will go away. Do not drive yourself to the hospital. This information is not intended to replace advice given to you by your health care provider. Make sure you discuss any questions you have with your health care provider. Document Revised: 09/14/2021 Document Reviewed: 09/14/2021 Elsevier Patient Education  2023 [employer]  

## 2022-05-18 ENCOUNTER — Telehealth: Payer: Self-pay

## 2022-05-18 NOTE — Telephone Encounter (Signed)
Patient called and stated she just was not feeling right so she checked her blood pressure - 160/83, HR 89. Confirmed BP reading was taken while patient was standing. Denies headache, vision changes or dizziness.   Advised patient to take one carvedilol 3.125mg  tablet as prescribed now.  Patient will recheck BP at 6-7pm and take another 3.125mg  carvedilol if SBP remains elevated above 150. Patient advised she does not need to take anything if SBP <150.

## 2022-05-18 NOTE — Telephone Encounter (Signed)
Patient repeat BP reading remains elevated at 165/93, HR 80. Patient taking second dose of carvedilol. She is re-checking BP later on this evening.  Patient has had the past 2-3 days of elevated standing BP.

## 2022-05-19 NOTE — Telephone Encounter (Signed)
Patient repeat BP yesterday evening around 7pm was 127/73.  Patient vitals this morning were 154/84, HR 84. Advised patient to take one carvedilol tablet following PRN instructions for SBP >150. Patient is to check BP again around bedtime and take second carvedilol if SBP >150.

## 2022-05-20 ENCOUNTER — Ambulatory Visit: Payer: Medicare HMO | Admitting: Rheumatology

## 2022-05-20 NOTE — Telephone Encounter (Signed)
Spoke with patient again. BP this morning was 160/90. Advised patient to take a dose of carvedilol then recheck around dinner time. Reiterated she takes her BP twice a day and if the top number is > 150 to take the carvedilol.

## 2022-05-21 ENCOUNTER — Telehealth: Payer: Self-pay | Admitting: Cardiology

## 2022-05-21 ENCOUNTER — Telehealth: Payer: Self-pay

## 2022-05-21 DIAGNOSIS — I10 Essential (primary) hypertension: Secondary | ICD-10-CM | POA: Diagnosis not present

## 2022-05-21 NOTE — Telephone Encounter (Signed)
Patient called back with her bp now at 10/760 hr 83 She ants to know how to proceed with her meds?

## 2022-05-21 NOTE — Telephone Encounter (Signed)
Call received on 3/21. SBP 180, HR 77 at around 6 PM. Currently only taking coreg 3.125 mg bid. She is also on midodrine due to some prior low BP readings. Could consider stopping this in future. For now, I asked her to take additional 1-2 tabs of Coreg 3.125 mg.   Time spent: 10 min   Nigel Mormon, MD Pager: [phone] Office: [phone]

## 2022-05-21 NOTE — Telephone Encounter (Signed)
Hold coreg if systolic BP 0000000 mmHg.  JG/AS, Patient has called after hours twice in last couple days. Please see my tel encounter. Please touch base with her and close the loop.   Thanks MJP

## 2022-05-21 NOTE — Telephone Encounter (Signed)
Patient aware.

## 2022-05-24 DIAGNOSIS — M549 Dorsalgia, unspecified: Secondary | ICD-10-CM | POA: Diagnosis not present

## 2022-05-24 DIAGNOSIS — S32020A Wedge compression fracture of second lumbar vertebra, initial encounter for closed fracture: Secondary | ICD-10-CM | POA: Diagnosis not present

## 2022-05-27 DIAGNOSIS — M5136 Other intervertebral disc degeneration, lumbar region: Secondary | ICD-10-CM | POA: Diagnosis not present

## 2022-05-27 DIAGNOSIS — M5451 Vertebrogenic low back pain: Secondary | ICD-10-CM | POA: Diagnosis not present

## 2022-06-03 DIAGNOSIS — M4856XD Collapsed vertebra, not elsewhere classified, lumbar region, subsequent encounter for fracture with routine healing: Secondary | ICD-10-CM | POA: Diagnosis not present

## 2022-06-03 DIAGNOSIS — M5451 Vertebrogenic low back pain: Secondary | ICD-10-CM | POA: Diagnosis not present

## 2022-06-03 DIAGNOSIS — M5459 Other low back pain: Secondary | ICD-10-CM | POA: Diagnosis not present

## 2022-06-04 DIAGNOSIS — S32020A Wedge compression fracture of second lumbar vertebra, initial encounter for closed fracture: Secondary | ICD-10-CM | POA: Diagnosis not present

## 2022-06-18 DIAGNOSIS — M5451 Vertebrogenic low back pain: Secondary | ICD-10-CM | POA: Diagnosis not present

## 2022-06-21 ENCOUNTER — Other Ambulatory Visit: Payer: Self-pay | Admitting: Physician Assistant

## 2022-06-21 NOTE — Telephone Encounter (Signed)
Last Fill: 03/26/2022  Eye exam: 10/19/2021 normal.    Labs: 05/05/2022 GFR is low and is stable.  Hemoglobin is low and stable.  Rest of the labs are normal.     Next Visit: 11/09/2022  Last Visit: 05/05/2022  DX:PMR (polymyalgia rheumatica)   Current Dose per office note 05/05/2022: Plaquenil 200 mg p.o. every other day   Okay to refill Plaquenil?

## 2022-06-23 DIAGNOSIS — M5451 Vertebrogenic low back pain: Secondary | ICD-10-CM | POA: Diagnosis not present

## 2022-06-25 DIAGNOSIS — Z961 Presence of intraocular lens: Secondary | ICD-10-CM | POA: Diagnosis not present

## 2022-06-25 DIAGNOSIS — H04123 Dry eye syndrome of bilateral lacrimal glands: Secondary | ICD-10-CM | POA: Diagnosis not present

## 2022-06-25 DIAGNOSIS — N183 Chronic kidney disease, stage 3 unspecified: Secondary | ICD-10-CM | POA: Diagnosis not present

## 2022-06-25 DIAGNOSIS — H5051 Esophoria: Secondary | ICD-10-CM | POA: Diagnosis not present

## 2022-07-07 DIAGNOSIS — N1832 Chronic kidney disease, stage 3b: Secondary | ICD-10-CM | POA: Diagnosis not present

## 2022-07-07 DIAGNOSIS — S32020A Wedge compression fracture of second lumbar vertebra, initial encounter for closed fracture: Secondary | ICD-10-CM | POA: Diagnosis not present

## 2022-07-07 DIAGNOSIS — D509 Iron deficiency anemia, unspecified: Secondary | ICD-10-CM | POA: Diagnosis not present

## 2022-07-07 DIAGNOSIS — M353 Polymyalgia rheumatica: Secondary | ICD-10-CM | POA: Diagnosis not present

## 2022-07-07 DIAGNOSIS — I129 Hypertensive chronic kidney disease with stage 1 through stage 4 chronic kidney disease, or unspecified chronic kidney disease: Secondary | ICD-10-CM | POA: Diagnosis not present

## 2022-07-07 DIAGNOSIS — E039 Hypothyroidism, unspecified: Secondary | ICD-10-CM | POA: Diagnosis not present

## 2022-07-08 DIAGNOSIS — M5451 Vertebrogenic low back pain: Secondary | ICD-10-CM | POA: Diagnosis not present

## 2022-07-08 DIAGNOSIS — M5136 Other intervertebral disc degeneration, lumbar region: Secondary | ICD-10-CM | POA: Diagnosis not present

## 2022-07-12 ENCOUNTER — Telehealth: Payer: Self-pay | Admitting: Neurology
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
## 2022-07-13 NOTE — Telephone Encounter (Signed)
Called the patient's son back. He states about 6 wks ago she had a fall and has a compression fx in spine. She has been wearing a back brace. He is concerned that since her fall there has been some worsening with her memory.  Without her knowing that he is calling in on this, he is wanting to have her evaluated sooner and assess movement and cognitive safety and discuss with her importance of no longer driving, if Dr Vickey Huger agrees based off assessment.  The family states they are just worried with her cognitive decline and just in the things they have noticed since her fall. They are really concerned about her driving.

## 2022-08-05 DIAGNOSIS — M5451 Vertebrogenic low back pain: Secondary | ICD-10-CM | POA: Diagnosis not present

## 2022-08-23 DIAGNOSIS — I679 Cerebrovascular disease, unspecified: Secondary | ICD-10-CM | POA: Diagnosis not present

## 2022-08-23 DIAGNOSIS — I129 Hypertensive chronic kidney disease with stage 1 through stage 4 chronic kidney disease, or unspecified chronic kidney disease: Secondary | ICD-10-CM | POA: Diagnosis not present

## 2022-08-23 DIAGNOSIS — N1832 Chronic kidney disease, stage 3b: Secondary | ICD-10-CM | POA: Diagnosis not present

## 2022-08-23 DIAGNOSIS — I2584 Coronary atherosclerosis due to calcified coronary lesion: Secondary | ICD-10-CM | POA: Diagnosis not present

## 2022-08-23 DIAGNOSIS — I700 Atherosclerosis of aorta: Secondary | ICD-10-CM | POA: Diagnosis not present

## 2022-08-23 DIAGNOSIS — I951 Orthostatic hypotension: Secondary | ICD-10-CM | POA: Diagnosis not present

## 2022-08-23 DIAGNOSIS — E785 Hyperlipidemia, unspecified: Secondary | ICD-10-CM | POA: Diagnosis not present

## 2022-08-23 DIAGNOSIS — M353 Polymyalgia rheumatica: Secondary | ICD-10-CM | POA: Diagnosis not present

## 2022-08-26 DIAGNOSIS — M5451 Vertebrogenic low back pain: Secondary | ICD-10-CM | POA: Diagnosis not present

## 2022-08-30 ENCOUNTER — Other Ambulatory Visit: Payer: Self-pay | Admitting: *Deleted

## 2022-08-30 ENCOUNTER — Telehealth: Payer: Self-pay | Admitting: Neurology

## 2022-08-30 MED ORDER — HYDROXYCHLOROQUINE SULFATE 200 MG PO TABS: 200.0000 mg | ORAL_TABLET | ORAL | 0 refills | Status: DC
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
## 2022-08-30 NOTE — Telephone Encounter (Signed)
Refill request received via fax from [facility] for PLQ   Last Fill: 06/21/2022  Eye exam: 10/19/2021 normal.   Labs: 05/05/2022 GFR is low and is stable.  Hemoglobin is low and stable.  Rest of the labs are normal.   Next Visit: 11/09/2022  Last Visit: 05/05/2022  DX: PMR (polymyalgia rheumatica)   Current Dose per office note 05/05/2022: Plaquenil 200 mg p.o. every other day   Okay to refill Plaquenil?

## 2022-08-31 ENCOUNTER — Other Ambulatory Visit: Payer: Self-pay | Admitting: Anesthesiology

## 2022-08-31 DIAGNOSIS — M5451 Vertebrogenic low back pain: Secondary | ICD-10-CM | POA: Diagnosis not present

## 2022-08-31 DIAGNOSIS — F514 Sleep terrors [night terrors]: Secondary | ICD-10-CM

## 2022-08-31 MED ORDER — CLONAZEPAM 0.5 MG PO TABS: ORAL_TABLET | ORAL | 1 refills | Status: DC

## 2022-09-01 ENCOUNTER — Other Ambulatory Visit: Payer: Self-pay

## 2022-09-01 ENCOUNTER — Encounter: Payer: Self-pay | Admitting: Neurology

## 2022-09-01 ENCOUNTER — Ambulatory Visit: Payer: Medicare HMO | Admitting: Neurology

## 2022-09-01 VITALS — BP 166/83 | HR 86 | Ht 64.0 in | Wt 115.0 lb

## 2022-09-01 DIAGNOSIS — R208 Other disturbances of skin sensation: Secondary | ICD-10-CM | POA: Diagnosis not present

## 2022-09-01 DIAGNOSIS — Q283 Other malformations of cerebral vessels: Secondary | ICD-10-CM | POA: Diagnosis not present

## 2022-09-01 DIAGNOSIS — F514 Sleep terrors [night terrors]: Secondary | ICD-10-CM | POA: Diagnosis not present

## 2022-09-01 DIAGNOSIS — R569 Unspecified convulsions: Secondary | ICD-10-CM | POA: Diagnosis not present

## 2022-09-01 DIAGNOSIS — I10 Essential (primary) hypertension: Secondary | ICD-10-CM

## 2022-09-01 DIAGNOSIS — G3184 Mild cognitive impairment, so stated: Secondary | ICD-10-CM

## 2022-09-01 MED ORDER — CARVEDILOL 3.125 MG PO TABS: 3.1250 mg | ORAL_TABLET | Freq: Two times a day (BID) | ORAL | 1 refills | Status: DC | PRN

## 2022-09-01 MED ORDER — CLONAZEPAM 0.125 MG PO TBDP: 0.1250 mg | ORAL_TABLET | Freq: Every day | ORAL | 1 refills | Status: DC
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
## 2022-09-01 NOTE — Patient Instructions (Signed)
Follow up with [facility] TEST ! In 4-8 weeks.

## 2022-09-06 ENCOUNTER — Telehealth: Payer: Self-pay | Admitting: Neurology

## 2022-09-06 NOTE — Telephone Encounter (Signed)
Pt states that [facility] called her and told her that they are not clear on the directions as to how she is to take her clonazepam .  Pt is asking RN to call [facility]

## 2022-09-06 NOTE — Telephone Encounter (Signed)
Appears that initially the order was refilled for what pt had been on. This was dc'd at the recent ov on 7/3. Pt should be on the clonazepam 0.125 mg disintegrating tablet. I have received the clarification order sheet and made the corrections. Waiting on MD to sign and then I will send to the pharmacy.

## 2022-09-07 DIAGNOSIS — M5451 Vertebrogenic low back pain: Secondary | ICD-10-CM | POA: Diagnosis not present

## 2022-09-13 NOTE — Telephone Encounter (Signed)
Pt stated she needs to talk to nurse about clonazepam. Stated she needs to know how to take medication stated she haven't took in a very long time.

## 2022-09-13 NOTE — Telephone Encounter (Signed)
Called patient to inform and explain how she will be taking her new dose for her Rx medication. Patient understood she will be taking one tablet 0.125mg  at bedtime. Pt verbalized understanding.Pt had no questions at this time but was encouraged to call back if questions arise.

## 2022-09-14 ENCOUNTER — Telehealth: Payer: Self-pay

## 2022-09-14 DIAGNOSIS — I10 Essential (primary) hypertension: Secondary | ICD-10-CM

## 2022-09-14 MED ORDER — HYDRALAZINE HCL 25 MG PO TABS: 25.0000 mg | ORAL_TABLET | Freq: Three times a day (TID) | ORAL | 2 refills | Status: DC | PRN

## 2022-09-14 NOTE — Telephone Encounter (Signed)
I will send for hydralazine 25 mg tablets to be taken TID prn for SPB >140 mm Hg standing

## 2022-09-14 NOTE — Telephone Encounter (Signed)
Physical therapy took her blood pressure sitting bp was 187/97. She had taked it earlier standing and it was 153/ 82 , she took 2  coregs but it hasn't help

## 2022-09-20 ENCOUNTER — Telehealth: Payer: Self-pay | Admitting: Neurology

## 2022-09-20 NOTE — Telephone Encounter (Signed)
Returned pt call and informed her that I called [facility], pt pharmacy. [facility] states her insurance will only let the pt received 30 tabs for 30 days. I mention to the patient that she needs to call her insurance and ask what has changed from her last Refill. Pt verbalized understanding. Pt had no questions at this time but was encouraged to call back if questions arise.

## 2022-09-20 NOTE — Telephone Encounter (Signed)
Pt said, pharmacy sent 30 tablets for clonazepam (KLONOPIN) 0.125 MG disintegrating tablet was suppose to be 90 tablet. About to run out of medication. Requesting prescription for the 60 tablets. Do not want to pay the higher price. Would like a call from the nurse.

## 2022-09-21 DIAGNOSIS — M5451 Vertebrogenic low back pain: Secondary | ICD-10-CM | POA: Diagnosis not present

## 2022-09-29 DIAGNOSIS — M5451 Vertebrogenic low back pain: Secondary | ICD-10-CM | POA: Diagnosis not present

## 2022-10-04 DIAGNOSIS — M5451 Vertebrogenic low back pain: Secondary | ICD-10-CM | POA: Diagnosis not present

## 2022-10-06 ENCOUNTER — Ambulatory Visit: Payer: Medicare HMO | Admitting: Cardiology

## 2022-10-07 DIAGNOSIS — M5136 Other intervertebral disc degeneration, lumbar region: Secondary | ICD-10-CM | POA: Diagnosis not present

## 2022-10-07 DIAGNOSIS — M5451 Vertebrogenic low back pain: Secondary | ICD-10-CM | POA: Diagnosis not present

## 2022-10-13 ENCOUNTER — Ambulatory Visit: Payer: Medicare HMO | Admitting: Cardiology

## 2022-10-13 NOTE — Telephone Encounter (Signed)
Called the patient back. There was no answer. Left a detailed message for the patient to call back.   Pt was on clonazepam 0.5 mg taking .25 mg at bedtime  She was decreased to 0.125 mg which is lower dose. 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

## 2022-10-13 NOTE — Telephone Encounter (Signed)
Patient said since changing the dosage have had falls. Would like a call from the nurse to discuss if need to go back to the original dosage.

## 2022-10-14 NOTE — Telephone Encounter (Signed)
Pt has called back to relay that she will continue on small dose until RN speaks with Dr Vickey Huger, this is FYI no call back requested

## 2022-10-14 NOTE — Telephone Encounter (Signed)
The phone staff did confirm that since decreasing the dose on the alprazolam she has had a increase in her falls related to REM BD. She would like to go back to the previous dose she was on.

## 2022-10-26 ENCOUNTER — Other Ambulatory Visit: Payer: Self-pay | Admitting: Physician Assistant

## 2022-10-26 NOTE — Progress Notes (Unsigned)
Patient: Gloria Anderson Date of Birth: [birth year]  Reason for Visit: Follow up History from: Patient, son Thayer Ohm  Primary Neurologist: Dohmeier   ASSESSMENT AND PLAN 89 y.o. year old female with parasomnia behavior, night terrors, mild cognitive impairment.  MoCA 21/30 today.  -Will go back to Klonopin 0.25 mg at bedtime for parasomnia behavior.  She does not need a new prescription.  She has several bottles on hand. Monitor for gait changes/falls. -We discussed driving, recommend she refrain from driving until OT evaluation for drivers rehab.  Her son is also concerned about driving, as needed discussed with her.  Dr. Vickey Huger had also restricted her driving. -She will follow-up in 6 months or sooner if needed with Dr. Vickey Huger  HISTORY OF PRESENT ILLNESS: Today 10/27/22 Saw Dr. Vickey Huger 09/01/2022 try to reduce Klonopin to less than 0.25 mg nightly. Switched to Klonopin 0.125 mg, has had more night terrors at least a dozen. Few nights ago, was looking for her baby. Increased episodes with lower dosing, would like to go back to 0.25 mg. No changes to gait during the day, no falls of recent. Lives alone, in independent living. Has aide who comes, few days a week. MOCA 21/30. Feels more trouble with memory, can't remember names. Has not been driving since March 2024 fall. Dr. Vickey Huger has asked her not to drive.   HISTORY  09/01/22 Dr. Vickey Huger: Gloria Anderson is a 89 y.o. female patient who is here with her son today-  for revisit 09/01/2022 for  follow up on a fall. She had fallen out of bed unsure why, unsure how , this was 10 Pm and has had been asleep before, she called 911, EMS was evaluating her the site,  was told she  was clearly speaking and  able to walk, therefore and she was not seen at the hospital - she ended up driving herself to urgent care the next morning ! She was seen at [facility] by a NP and ordered an X ray which identified a compression fracture of the thoracic  spine.    She was not referred for kyphoplasty after a discussion with local MD.  Dr Evlyn Kanner saw her last week, and she was not referred for special treatment, she has PT. She is clearly speaking here, well articulated and walked quickly.    Sheis not driving now. She uses a cane.  Her very first time , she had accidentally doubled up on Klonopin, her dose is 0.25 mg, she tool 0.5 mg.   We are discussing the dose reduction today, 0.25 mg . I like to further reduce this dose.   REVIEW OF SYSTEMS: Out of a complete 14 system review of symptoms, the patient complains only of the following symptoms, and all other reviewed systems are negative.  See HPI  ALLERGIES: No Known Allergies  HOME MEDICATIONS: Outpatient Medications Prior to Visit  Medication Sig Dispense Refill   acetaminophen (TYLENOL) 325 MG tablet Take 325 mg by mouth every 6 (six) hours as needed for moderate pain or headache.     atorvastatin (LIPITOR) 10 MG tablet Take 10 mg by mouth every evening.     BIOTIN FORTE PO Take 1 capsule by mouth daily.     Calcium Citrate (CITRACAL PO) Take 2 tablets by mouth in the morning and at bedtime.     carvedilol (COREG) 3.125 MG tablet Take 1 tablet (3.125 mg total) by mouth 2 (two) times daily as needed. For standing BP >150 mm Hg 180 tablet  1   clonazepam (KLONOPIN) 0.125 MG disintegrating tablet Take 1 tablet (0.125 mg total) by mouth at bedtime. 90 tablet 1   denosumab (PROLIA) 60 MG/ML SOSY injection Inject 60 mg into the skin every 6 (six) months.     hydrALAZINE (APRESOLINE) 25 MG tablet Take 1 tablet (25 mg total) by mouth 3 (three) times daily as needed (Standing BP >140 mm Hg). 90 tablet 2   hydroxychloroquine (PLAQUENIL) 200 MG tablet Take 1 tablet (200 mg total) by mouth every other day. 45 tablet 0   levothyroxine (SYNTHROID, LEVOTHROID) 50 MCG tablet Take 50 mcg by mouth daily before breakfast.      Multiple Vitamin (MULTIVITAMIN PO) Take 1 tablet by mouth daily.      omeprazole (PRILOSEC) 20 MG capsule Take 20 mg by mouth daily.     OVER THE COUNTER MEDICATION Take 2 tablets by mouth in the morning and at bedtime. Calcium mini     polyethylene glycol (MIRALAX / GLYCOLAX) packet Take 17 g by mouth daily as needed for moderate constipation.      Propylene Glycol (SYSTANE BALANCE OP) Apply 1 drop to eye as needed.     Apoaequorin (PREVAGEN) 10 MG CAPS Take 10 mg by mouth daily after breakfast. (Patient not taking: Reported on 05/10/2022) 30 capsule 0   Lifitegrast (XIIDRA OP) Apply 1 drop to eye 2 (two) times daily.     midodrine (PROAMATINE) 2.5 MG tablet Take 1 tablet (2.5 mg total) by mouth 2 (two) times daily as needed. Use it for dizziness.  Please avoid laying down for 4 hours after taking the medication. (Patient not taking: Reported on 05/05/2022) 180 tablet 1   No facility-administered medications prior to visit.    PAST MEDICAL HISTORY: Past Medical History:  Diagnosis Date   Arthritis    Complication of anesthesia    Constipation    Depression    GERD (gastroesophageal reflux disease)    Hypertension    Hypothyroidism    Osteoporosis    Pneumonia 2018   Polymyalgia (HCC)    Polymyalgia rheumatica (HCC)    PONV (postoperative nausea and vomiting)     PAST SURGICAL HISTORY: Past Surgical History:  Procedure Laterality Date   BASAL CELL CARCINOMA EXCISION  2022   on face   COLONOSCOPY W/ POLYPECTOMY     EYE SURGERY     both cataracts   LUMBAR LAMINECTOMY/DECOMPRESSION MICRODISCECTOMY N/A 08/15/2018   Procedure: Lumbar three to Lumbar five Decompressive lumbar laminectomy;  Surgeon: Maeola Harman, MD;  Location: [facility] OR;  Service: Neurosurgery;  Laterality: N/A;   NASAL SINUS SURGERY     SHOULDER SURGERY Right 2007   repair   TONSILLECTOMY     TRIGGER FINGER RELEASE  03/19/2011   Procedure:  left middle finger RELEASE TRIGGER FINGER/A-1 PULLEY;  Surgeon: Wyn Forster., MD;  Location: [facility];  Service:  Orthopedics;  Laterality: Right;  Procedure:  Release Right Long and Ring Trigger Fingers, Release Left Long Trigger Finger, Injection Left Long Proximal Phalangeal Joint   TRIGGER FINGER RELEASE Right    Ring finger, middle finger   UPPER GASTROINTESTINAL ENDOSCOPY      FAMILY HISTORY: Family History  Problem Relation Age of Onset   Tuberculosis Mother 56   CVA Father    Stroke Father 25   Hypertension Sister 101   Lung cancer Brother 75   Hypertension Son     SOCIAL HISTORY: Social History   Socioeconomic History   Marital status:  Widowed    Spouse name: Not on file   Number of children: 2   Years of education: HS   Highest education level: Not on file  Occupational History   Occupation: RETIRED    Employer: [employer]  Tobacco Use   Smoking status: Never    Passive exposure: Never   Smokeless tobacco: Never  Vaping Use   Vaping status: Never Used  Substance and Sexual Activity   Alcohol use: No    Alcohol/week: 0.0 standard drinks of alcohol   Drug use: Never   Sexual activity: Not on file  Other Topics Concern   Not on file  Social History Narrative   Patient is widowed.   Patient has two children.   Patient does not drink any caffeine.    Patient has a high school education.   Patient is right-handed.            Social Determinants of Health   Financial Resource Strain: Not on file  Food Insecurity: Not on file  Transportation Needs: Not on file  Physical Activity: Not on file  Stress: Not on file  Social Connections: Not on file  Intimate Partner Violence: Not on file   PHYSICAL EXAM  Vitals:   10/27/22 1532 10/27/22 1548  BP: (!) 148/66 (!) 152/73  Pulse: 96   Weight: 116 lb 8 oz (52.8 kg)   Height: 5\' 4"  (1.626 m)    Body mass index is 20 kg/m.    10/27/2022    3:54 PM 06/17/2021    3:11 PM 08/05/2020    1:06 PM 07/09/2019    3:14 PM 01/09/2019    2:29 PM  Montreal Cognitive Assessment   Visuospatial/ Executive (0/5) 3 2 4 3 2    Naming (0/3) 2 2 3 2 3   Attention: Read list of digits (0/2) 0 1 2 2 1   Attention: Read list of letters (0/1) 1 1 1 1 1   Attention: Serial 7 subtraction starting at 100 (0/3) 3 0 3 3 3   Language: Repeat phrase (0/2) 0 2 0 2 2  Language : Fluency (0/1) 0 0 1 0 1  Abstraction (0/2) 2 2 2 2 2   Delayed Recall (0/5) 4 5 5 5 5   Orientation (0/6) 6 5 6 6 6   Total 21 20 27 26 26   Adjusted Score (based on education)   27     Generalized: Well developed, in no acute distress, somewhat flat Neurological examination  Mentation: Alert oriented to time, place, history taking. Follows all commands speech and language fluent Cranial nerve II-XII: Pupils were equal round reactive to light. Extraocular movements were full, visual field were full on confrontational test. Facial sensation and strength were normal. Head turning and shoulder shrug  were normal and symmetric. Motor: The motor testing reveals 5 over 5 strength of all 4 extremities. Good symmetric motor tone is noted throughout.  Sensory: Sensory testing is intact to soft touch on all 4 extremities. No evidence of extinction is noted.  Coordination: Cerebellar testing reveals good finger-nose-finger and heel-to-shin bilaterally.  Gait and station: Gait is normal, independent, has a cane she carries just in case in the hallway Reflexes: Deep tendon reflexes are symmetric and normal bilaterally.   DIAGNOSTIC DATA (LABS, IMAGING, TESTING) - I reviewed patient records, labs, notes, testing and imaging myself where available.  Lab Results  Component Value Date   WBC 5.1 05/05/2022   HGB 10.7 (L) 05/05/2022   HCT 31.9 (L) 05/05/2022   MCV 98.2  05/05/2022   PLT 174 05/05/2022      Component Value Date/Time   NA 141 05/05/2022 1201   NA 137 12/29/2021 1450   K 4.4 05/05/2022 1201   CL 104 05/05/2022 1201   CO2 29 05/05/2022 1201   GLUCOSE 99 05/05/2022 1201   BUN 31 (H) 05/05/2022 1201   BUN 21 12/29/2021 1450   CREATININE 1.48 (H)  05/05/2022 1201   CALCIUM 10.2 05/05/2022 1201   PROT 7.3 05/05/2022 1201   PROT 6.9 12/29/2021 1450   ALBUMIN 4.5 12/29/2021 1450   AST 26 05/05/2022 1201   ALT 18 05/05/2022 1201   ALKPHOS 53 12/29/2021 1450   BILITOT 1.0 05/05/2022 1201   BILITOT 0.6 12/29/2021 1450   GFRNONAA 30 (L) 05/16/2021 1817   GFRNONAA 41 (L) 08/20/2020 1544   GFRAA 48 (L) 08/20/2020 1544   No results found for: "CHOL", "HDL", "LDLCALC", "LDLDIRECT", "TRIG", "CHOLHDL" No results found for: "HGBA1C" Lab Results  Component Value Date   VITAMINB12 1,421 (H) 05/17/2021   Lab Results  Component Value Date   TSH 1.243 05/17/2021    Margie Ege, AGNP-C, DNP 10/27/2022, 3:54 PM [facility] [address] [place], [place] [address] [phone]

## 2022-10-27 ENCOUNTER — Ambulatory Visit: Payer: Medicare HMO | Admitting: Neurology

## 2022-10-27 ENCOUNTER — Encounter: Payer: Self-pay | Admitting: Neurology

## 2022-10-27 VITALS — BP 152/73 | HR 96 | Ht 64.0 in | Wt 116.5 lb

## 2022-10-27 DIAGNOSIS — F514 Sleep terrors [night terrors]: Secondary | ICD-10-CM | POA: Diagnosis not present

## 2022-10-27 DIAGNOSIS — G3184 Mild cognitive impairment, so stated: Secondary | ICD-10-CM | POA: Diagnosis not present

## 2022-10-27 DIAGNOSIS — G4750 Parasomnia, unspecified: Secondary | ICD-10-CM | POA: Diagnosis not present

## 2022-10-27 MED ORDER — CLONAZEPAM 0.5 MG PO TABS: 0.2500 mg | ORAL_TABLET | Freq: Every day | ORAL | Status: DC

## 2022-10-27 NOTE — Patient Instructions (Signed)
Please call to schedule driving evaluation with occupational therapist.  We will go back to higher dose Klonopin 0.25 mg at bedtime.  Please let me know if you need new prescription.  Will see you back in 6 months Dr. Vickey Huger.  Thanks!!

## 2022-10-27 NOTE — Progress Notes (Signed)
Office Visit Note  Patient: Gloria Anderson             Date of Birth: October 17, 1933           MRN: [id number]             PCP: Adrian Prince, MD Referring: Adrian Prince, MD Visit Date: 11/09/2022 Occupation: @GUAROCC @  Subjective:  Medication management   History of Present Illness: Gloria Anderson is a 89 y.o. female with polymyalgia rheumatica, osteoarthritis and osteoporosis.  She states about 4 months ago she fell off her bed and heard a pop.  She was in a back brace for about 9 weeks.  She was evaluated by Dr. Jillyn Hidden and was diagnosed vertebral fracture.  Patient states she has been going to physical therapy.  She has intermittent discomfort.  None of the other joints are painful or swollen.  She denies any muscular weakness or tenderness.  She ambulates with the help of a cane and uses a walker at home.  She states her balance is not very good.    Activities of Daily Living:  Patient reports morning stiffness for 0 minute.   Patient Denies nocturnal pain.  Difficulty dressing/grooming: Denies Difficulty climbing stairs: Denies Difficulty getting out of chair: Denies Difficulty using hands for taps, buttons, cutlery, and/or writing: Reports  Review of Systems  Constitutional:  Positive for fatigue.  HENT:  Positive for mouth dryness. Negative for mouth sores.   Eyes:  Positive for dryness.  Respiratory:  Negative for shortness of breath.   Cardiovascular:  Negative for chest pain and palpitations.  Gastrointestinal:  Positive for constipation. Negative for blood in stool and diarrhea.  Endocrine: Positive for increased urination.  Genitourinary:  Negative for involuntary urination.  Musculoskeletal:  Positive for gait problem. Negative for joint pain, joint pain, joint swelling, myalgias, muscle weakness, morning stiffness, muscle tenderness and myalgias.  Skin:  Positive for hair loss. Negative for color change, rash and sensitivity to sunlight.   Allergic/Immunologic: Negative for susceptible to infections.  Neurological:  Negative for dizziness and headaches.  Hematological:  Negative for swollen glands.  Psychiatric/Behavioral:  Positive for depressed mood and sleep disturbance. The patient is nervous/anxious.     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HCC) 05/23/2019   Nocturnal seizures (HCC) 05/23/2019   Adult night terrors 01/09/2019   Degenerative lumbar spinal stenosis 08/15/2018   Memory difficulty 07/26/2017   Other parasomnia 07/26/2017   Anemia 06/02/2017   Osteopenia 06/02/2017   Spinal stenosis 06/02/2017   Iron deficiency anemia secondary to inadequate dietary iron intake 08/05/2016   Fatigue associated with anemia 08/05/2016   Primary osteoarthritis of both feet 02/04/2016   Supine hypertension 02/04/2016   PMR (polymyalgia rheumatica) (HCC) 02/03/2016   Osteoarthritis, hand 02/03/2016   Age-related osteoporosis without current pathological fracture 02/03/2016   DDD (degenerative disc disease), lumbar 02/03/2016   High risk medication use 02/03/2016   Parasomnia, organic 08/04/2015   Parasomnia due to medical condition 02/03/2015   Cerebral cavernous malformation type 1 12/04/2013   Night terrors, adult 09/26/2013  HEMORRHOIDS-EXTERNAL 09/19/2009   GERD 09/19/2009   CONSTIPATION 09/19/2009   DYSPHAGIA 09/19/2009   PERSONAL HISTORY OF COLONIC POLYPS 09/19/2009    Past Medical History:  Diagnosis Date   Arthritis    Complication  of anesthesia    Constipation    Depression    GERD (gastroesophageal reflux disease)    Hypertension    Hypothyroidism    Osteoporosis    Pneumonia 2018   Polymyalgia (HCC)    Polymyalgia rheumatica (HCC)    PONV (postoperative nausea and vomiting)     Family History  Problem Relation Age of Onset   Tuberculosis Mother 7   CVA Father    Stroke Father 27   Hypertension Sister 7   Lung cancer Brother 47   Hypertension Son    Past Surgical History:  Procedure Laterality Date   BASAL CELL CARCINOMA EXCISION  2022   on face   COLONOSCOPY W/ POLYPECTOMY     EYE SURGERY     both cataracts   LUMBAR LAMINECTOMY/DECOMPRESSION MICRODISCECTOMY N/A 08/15/2018   Procedure: Lumbar three to Lumbar five Decompressive lumbar laminectomy;  Surgeon: Maeola Harman, MD;  Location[facility] OR;  Service: Neurosurgery;  Laterality: N/A;   NASAL SINUS SURGERY     SHOULDER SURGERY Right 2007   repair   TONSILLECTOMY     TRIGGER FINGER RELEASE  03/19/2011   Procedure:  left middle finger RELEASE TRIGGER FINGER/A-1 PULLEY;  Surgeon: Wyn Forster., MD;  Location: [facility];  Service: Orthopedics;  Laterality: Right;  Procedure:  Release Right Long and Ring Trigger Fingers, Release Left Long Trigger Finger, Injection Left Long Proximal Phalangeal Joint   TRIGGER FINGER RELEASE Right    Ring finger, middle finger   UPPER GASTROINTESTINAL ENDOSCOPY     Social History   Social History Narrative   Patient is widowed.   Patient has two children.   Patient does not drink any caffeine.    Patient has a high school education.   Patient is right-handed.            Immunization History  Administered Date(s) Administered   Fluad Quad(high Dose 65+) 12/07/2021   Influenza Split 12/01/2010, 11/12/2011, 12/13/2011, 12/12/2012, 12/24/2013, 11/30/2019   Influenza, High Dose Seasonal PF 12/07/2015, 12/19/2016, 11/30/2017   Influenza, Quadrivalent, Recombinant, Inj, Pf 10/31/2018    Influenza,inj,Quad PF,6+ Mos 12/24/2013, 11/07/2014   PFIZER Comirnaty(Gray Top)Covid-19 Tri-Sucrose Vaccine 08/23/2020   PFIZER(Purple Top)SARS-COV-2 Vaccination 03/24/2019, 04/13/2019, 11/29/2019   Pneumococcal Conjugate-13 07/21/2016   Pneumococcal Polysaccharide-23 01/01/2009, 01/12/2013   Td,absorbed, Preservative Free, Adult Use, Lf Unspecified 10/18/2011   Tdap 06/05/2013   Zoster Recombinant(Shingrix) 08/01/2017, 10/10/2017   Zoster, Live 07/22/2010, 08/31/2010, 10/10/2017     Objective: Vital Signs: BP (!) 183/77 (BP Location: Right Arm, Patient Position: Sitting, Cuff Size: Small)   Pulse 81   Resp 14   Ht 5\' 4"  (1.626 m)   Wt 120 lb (54.4 kg)   BMI 20.60 kg/m    Physical Exam Vitals and nursing note reviewed.  Constitutional:      Appearance: She is well-developed.  HENT:     Head: Normocephalic and atraumatic.  Eyes:     Conjunctiva/sclera: Conjunctivae normal.  Cardiovascular:     Rate and Rhythm: Normal rate and regular rhythm.     Heart sounds: Normal heart sounds.  Pulmonary:     Effort: Pulmonary effort is normal.     Breath sounds: Normal breath sounds.  Abdominal:     General: Bowel sounds are  normal.     Palpations: Abdomen is soft.  Musculoskeletal:     Cervical back: Normal range of motion.  Lymphadenopathy:     Cervical: No cervical adenopathy.  Skin:    General: Skin is warm and dry.     Capillary Refill: Capillary refill takes less than 2 seconds.  Neurological:     Mental Status: She is alert and oriented to person, place, and time.  Psychiatric:        Behavior: Behavior normal.      Musculoskeletal Exam: Cervical spine was in good range of motion.  She had some discomfort range of motion of the lumbar spine.  There was no point tenderness.  Shoulder joints, elbow joints, wrist joints, MCPs PIPs and DIPs were in good range of motion.  Hip joints and knee joints in good range of motion.  There was no tenderness over ankles or MTPs.  CDAI  Exam: CDAI Score: -- Patient Global: --; Provider Global: -- Swollen: --; Tender: -- Joint Exam 11/09/2022   No joint exam has been documented for this visit   There is currently no information documented on the homunculus. Go to the Rheumatology activity and complete the homunculus joint exam.  Investigation: No additional findings.  Imaging: No results found.  Recent Labs: Lab Results  Component Value Date   WBC 5.1 05/05/2022   HGB 10.7 (L) 05/05/2022   PLT 174 05/05/2022   NA 141 05/05/2022   K 4.4 05/05/2022   CL 104 05/05/2022   CO2 29 05/05/2022   GLUCOSE 99 05/05/2022   BUN 31 (H) 05/05/2022   CREATININE 1.48 (H) 05/05/2022   BILITOT 1.0 05/05/2022   ALKPHOS 53 12/29/2021   AST 26 05/05/2022   ALT 18 05/05/2022   PROT 7.3 05/05/2022   ALBUMIN 4.5 12/29/2021   CALCIUM 10.2 05/05/2022   GFRAA 48 (L) 08/20/2020    Speciality Comments: PLQ eye exam: 10/19/2021 normal. Dr. Nile Riggs. Follow up in 1 year.  Patient is awaiting a phone call from new eye doctor to schedule PLQ eye exam   Procedures:  No procedures performed Allergies: Patient has no known allergies.   Assessment / Plan:     Visit Diagnoses: PMR (polymyalgia rheumatica) (HCC)-patient denies increased muscle weakness or tenderness.  She had good muscle strength in upper and  lower extremities.  High risk medication use - Plaquenil 200 mg p.o. every other day. PLQ eye exam: 10/19/2021 -repeat eye examination is due.  Her last labs were in March.  Will obtain labs today.  Plan: CBC with Differential/Platelet, COMPLETE METABOLIC PANEL WITH GFR.  Information regarding in addition was placed in the AVS.  History of repair of right rotator cuff-she had good range of motion without discomfort.  Chronic left shoulder pain-she denied any discomfort today.  Primary osteoarthritis of both hands-she had bilateral PIP and DIP thickening with no synovitis.  Joint protection was discussed.  Primary osteoarthritis  of both feet-discomfort reported today.  DDD (degenerative disc disease), cervical-she had good range of motion.  DDD (degenerative disc disease), lumbar-she has been spearing seeing increased lower back pain since her fall about 4 months ago.  She was evaluated by Dr. Jillyn Hidden.  Patient states she fell off the bed in her sleep.  According to the patient she was diagnosed with a lumbar vertebral fracture.  She was in a back brace for about 9 weeks and did physical therapy.  She states the pain is mild and intermittent now.  Age-related osteoporosis without current  pathological fracture - bone density was on October 10, 2018.  Left femoral T score was -2.3, BMD 0.591.  She was on Prolia by Dr. Evlyn Kanner in the past.  She has been getting Prolia in our office now.  We will schedule Prolia after the lab results are available.  DEXA scan monitored by Dr. Evlyn Kanner.  She has not had DEXA scan in the last few years.  I will schedule DEXA scan.  Chronic kidney disease, unspecified CKD stage - Followed by Dr. Malen Gauze.  History of hypertension -blood pressure was elevated today at 181/80.  Repeat blood pressure was also elevated.  She was advised to follow-up with Dr. Jacinto Halim.  Other medical problems are listed as follows:  History of gastroesophageal reflux (GERD)  Night terrors, adult - Followed by Dr. Vickey Huger  Sleep walking  Gait instability-she ambulates with the help of a cane and a walker.  Anxiety and depression  Other headache syndrome  Vitamin D deficiency -she has been taking vitamin D supplement.  Will check vitamin D level today.  Plan: VITAMIN D 25 Hydroxy (Vit-D Deficiency, Fractures)  Orders: Orders Placed This Encounter  Procedures   DG BONE DENSITY (DXA)   CBC with Differential/Platelet   COMPLETE METABOLIC PANEL WITH GFR   VITAMIN D 25 Hydroxy (Vit-D Deficiency, Fractures)   No orders of the defined types were placed in this encounter.   Follow-Up Instructions: Return in about 5  months (around 04/11/2023) for Polymyalgia rheumatica, Osteoarthritis.   Pollyann Savoy, MD  Note - This record has been created using Animal nutritionist.  Chart creation errors have been sought, but may not always  have been located. Such creation errors do not reflect on  the standard of medical care.

## 2022-10-28 ENCOUNTER — Ambulatory Visit: Payer: Medicare HMO | Admitting: Cardiology

## 2022-10-28 ENCOUNTER — Telehealth: Payer: Self-pay | Admitting: Cardiology

## 2022-10-28 ENCOUNTER — Encounter: Payer: Self-pay | Admitting: Cardiology

## 2022-10-28 VITALS — BP 135/61 | HR 87 | Resp 16 | Ht 64.0 in | Wt 115.8 lb

## 2022-10-28 DIAGNOSIS — I951 Orthostatic hypotension: Secondary | ICD-10-CM | POA: Diagnosis not present

## 2022-10-28 DIAGNOSIS — I10 Essential (primary) hypertension: Secondary | ICD-10-CM | POA: Diagnosis not present

## 2022-10-28 NOTE — Progress Notes (Signed)
Primary Physician/Referring:  Adrian Prince, MD  Patient ID: Gloria Anderson, female    DOB: 09-08-1933, 89 y.o.   MRN: [id number]  Chief Complaint  Patient presents with   Orthostatic hypotension   HPI:    Gloria Anderson  is a 89 y.o. Caucasian female patient with with supine hypertension and orthostatic hypotension, chronic stage 3 CKD, mild hyperlipidemia, moderate MR and TR, moderate pulmonary hypertension.  She is extremely sensitive to blood pressure variations.  She could not tolerate even minimal doses of antihypertensive medications due to marked dizziness and low blood pressure, systolic blood pressure dropping down to 70 mmHg even with carvedilol 3.125 mg twice daily.  She does sleep on a wedge at night due to orthostatic hypertension.  Larey Seat off of her bed while asleep, has hurt her back.  Since then she has not been wearing support stockings due to back pain.  Fortunately she has not had any further dizziness, near-syncope or syncope.  She continues to remain active.   Denies chest pain or palpitations.  States that she is asymptomatic and feeling the best that she has in a while.  No dizziness or syncope.  Past Medical History:  Diagnosis Date   Arthritis    Complication of anesthesia    Constipation    Depression    GERD (gastroesophageal reflux disease)    Hypertension    Hypothyroidism    Osteoporosis    Pneumonia 2018   Polymyalgia (HCC)    Polymyalgia rheumatica (HCC)    PONV (postoperative nausea and vomiting)     Social History   Tobacco Use   Smoking status: Never    Passive exposure: Never   Smokeless tobacco: Never  Substance Use Topics   Alcohol use: No    Alcohol/week: 0.0 standard drinks of alcohol   ROS  Review of Systems  Cardiovascular:  Negative for chest pain, dyspnea on exertion, leg swelling and palpitations.  Musculoskeletal:  Positive for back pain.  Gastrointestinal:  Negative for melena.  Neurological:  Positive for dizziness  (occassional).   Objective  Blood pressure 135/61, pulse 87, resp. rate 16, height 5\' 4"  (1.626 m), weight 115 lb 12.8 oz (52.5 kg), SpO2 98%.     10/28/2022    9:54 AM 10/28/2022    9:42 AM 10/27/2022    3:48 PM  Vitals with BMI  Height  5\' 4"    Weight  115 lbs 13 oz   BMI  19.87   Systolic 135 156 829  Diastolic 61 72 73  Pulse 87 90     Orthostatic VS for the past 72 hrs (Last 3 readings):  Patient Position BP Location Cuff Size  10/28/22 0954 Sitting Left Arm Small  10/28/22 0942 Sitting Left Arm Small    Physical Exam Vitals reviewed.  Constitutional:      Appearance: She is well-developed.  Neck:     Vascular: Carotid bruit (Bilateral) present. No JVD.  Cardiovascular:     Rate and Rhythm: Normal rate and regular rhythm.     Pulses: Normal pulses and intact distal pulses.     Heart sounds: Murmur heard.     Midsystolic murmur is present with a grade of 2/6 at the lower right sternal border and apex.  Pulmonary:     Effort: Pulmonary effort is normal. No accessory muscle usage or respiratory distress.     Breath sounds: Normal breath sounds.  Abdominal:     General: Bowel sounds are normal.     Palpations: Abdomen is  soft.    Laboratory examination:   Recent Labs    12/29/21 1450 05/05/22 1201  NA 137 141  K 4.2 4.4  CL 98 104  CO2 26 29  GLUCOSE 91 99  BUN 21 31*  CREATININE 1.36* 1.48*  CALCIUM 9.9 10.2      Latest Ref Rng & Units 05/05/2022   12:01 PM 12/29/2021    2:50 PM 05/16/2021    6:17 PM  CMP  Glucose 65 - 99 mg/dL 99  91  962   BUN 7 - 25 mg/dL 31  21  34   Creatinine 0.60 - 0.95 mg/dL 9.52  8.41  3.24   Sodium 135 - 146 mmol/L 141  137  138   Potassium 3.5 - 5.3 mmol/L 4.4  4.2  4.3   Chloride 98 - 110 mmol/L 104  98  101   CO2 20 - 32 mmol/L 29  26  27    Calcium 8.6 - 10.4 mg/dL 40.1  9.9  02.7   Total Protein 6.1 - 8.1 g/dL 7.3  6.9  6.8   Total Bilirubin 0.2 - 1.2 mg/dL 1.0  0.6  1.6   Alkaline Phos 44 - 121 IU/L  53  43   AST 10  - 35 U/L 26  28  37   ALT 6 - 29 U/L 18  19  24        Latest Ref Rng & Units 05/05/2022   12:01 PM 12/29/2021    2:50 PM 05/16/2021    6:17 PM  CBC  WBC 3.8 - 10.8 Thousand/uL 5.1  5.6  7.2   Hemoglobin 11.7 - 15.5 g/dL 25.3  66.4  40.3   Hematocrit 35.0 - 45.0 % 31.9  31.0  32.5   Platelets 140 - 400 Thousand/uL 174  187  171    External labs:  Labs 06/25/2021:  Hb 11.5/HCT 31.4, platelets 158.  Serum glucose 100 mg, BUN 33, creatinine 1.43, EGFR 36 mL, potassium 4.5.  Magnesium 2.0. Labs 03/11/2021:  Hb 11.6/HCT 33.0, platelets 209.  Normal indicis.  Serum glucose 119, BUN 21, creatinine 1.2, EGFR 42 mL, potassium 4.6.  Magnesium 2.1.  Hb 11.5/HCT 31.4, platelets 158.  Normal indicis. Cholesterol, total 162.000 m 05/26/2020 HDL 79.000 mg 05/26/2020 LDL 74.000 mg 05/26/2020 Triglycerides 43.000 mg 05/26/2020  A1C 4.900 % 05/26/2020  Allergies  No Known Allergies   Final Medications at End of Visit     Current Outpatient Medications:    acetaminophen (TYLENOL) 325 MG tablet, Take 325 mg by mouth every 6 (six) hours as needed for moderate pain or headache., Disp: , Rfl:    atorvastatin (LIPITOR) 10 MG tablet, Take 10 mg by mouth every evening., Disp: , Rfl:    BIOTIN FORTE PO, Take 1 capsule by mouth daily., Disp: , Rfl:    Calcium Citrate (CITRACAL PO), Take 2 tablets by mouth in the morning and at bedtime., Disp: , Rfl:    carvedilol (COREG) 3.125 MG tablet, Take 1 tablet (3.125 mg total) by mouth 2 (two) times daily as needed. For standing BP >150 mm Hg, Disp: 180 tablet, Rfl: 1   clonazePAM (KLONOPIN) 0.5 MG tablet, Take 0.5 tablets (0.25 mg total) by mouth at bedtime., Disp: , Rfl:    denosumab (PROLIA) 60 MG/ML SOSY injection, Inject 60 mg into the skin every 6 (six) months., Disp: , Rfl:    hydrALAZINE (APRESOLINE) 25 MG tablet, Take 1 tablet (25 mg total) by mouth 3 (three) times daily as  needed (Standing BP >140 mm Hg)., Disp: 90 tablet, Rfl: 2   hydroxychloroquine  (PLAQUENIL) 200 MG tablet, Take 1 tablet (200 mg total) by mouth every other day., Disp: 45 tablet, Rfl: 0   levothyroxine (SYNTHROID, LEVOTHROID) 50 MCG tablet, Take 50 mcg by mouth daily before breakfast. , Disp: , Rfl:    Multiple Vitamin (MULTIVITAMIN PO), Take 1 tablet by mouth daily., Disp: , Rfl:    omeprazole (PRILOSEC) 20 MG capsule, Take 20 mg by mouth daily., Disp: , Rfl:    OVER THE COUNTER MEDICATION, Take 2 tablets by mouth in the morning and at bedtime. Calcium mini, Disp: , Rfl:    polyethylene glycol (MIRALAX / GLYCOLAX) packet, Take 17 g by mouth daily as needed for moderate constipation. , Disp: , Rfl:    Propylene Glycol (SYSTANE BALANCE OP), Apply 1 drop to eye as needed., Disp: , Rfl:    Radiology:   High-resolution CT scan of the chest 07/06/2021, comparison 04/05/2018: 1. Subtle findings in the lung bases  suggestive of interstitial lung disease, stable compared to the prior study, considered indeterminate for usual interstitial pneumonia (UIP) per current ATS guidelines. Repeat high-resolution chest CT is suggested in 12 months to assess for temporal changes in the appearance of the lung parenchyma. 2. Aortic atherosclerosis, in addition to left main and three-vessel coronary artery disease. Assessment for potential risk factor modification, dietary therapy or pharmacologic therapy may be warranted, if clinically indicated.  Cardiac Studies:   Exercise Treadmill Stress Test 10/07/2017:  Indication: chest pain The patient exercised on Bruce protocol for  06:19 min. Patient achieved  7.40 METS and reached HR  114 bpm, which is  82 % of maximum age-predicted HR.  Stress test terminated due to fatigue.    Exercise capacity was fair for age. HR Response to Exercise: Appropriate. BP Response to Exercise: Normal resting BP- appropriate response. Chest Pain: none. Arrhythmias: Occasional PVC s. Resting EKG demonstrates Normal sinus rhythm. ST Changes: With peak exercise there  was no ST-T changes of ischemia.   Overall Impression:  Submaximal stress test with no ischemic changes. Continue primary/secondary prevention.   Echocardiogram 12/13/2018: Normal LV systolic function with EF 67%. Left ventricle  cavity is normal in size. Normal left ventricular wall  thickness. Normal global wall motion. Diastolic function  could not be assessed due to severity of mitral  regurgitation.  Moderate (Grade II) mitral regurgitation. Moderate tricuspid regurgitation. Moderate pulmonary hypertension. Estimated pulmonary artery systolic pressure is  50 mmHg.  Small circumferential pericardial effusion wth no hemodynamic  compromise.  IVC is dilated with a respiratory response of <50%. Estimated  RA pressure 10-15 mmHg. No significant change compared to previous study on  05/09/2018.  Carotid artery duplex  07/04/2019: No hemodynamically significant arterial disease in the internal carotid artery bilaterally. Antegrade right vertebral artery flow. Antegrade left vertebral artery flow.  Ambulatory cardiac telemetry 5 days (05/12/2021 - 05/17/2021): Predominant underlying rhythm was sinus.  Patient had episodes of supraventricular tachycardia as well as atrial tachycardia which were symptomatic.  Rare PACs and PVCs.  No evidence of atrial fibrillation, high degree AV block, pauses >3 seconds, or ventricular tachycardia.  Longest episode of atrial tachycardia lasting 4 hours and 45 minutes.  And heart rate 56 bpm, maximum heart rate 2018 bpm.  EKG:    EKG 10/28/2022: Normal sinus rhythm with rate of 83 bpm, left atrial enlargement, otherwise normal EKG.  Compared to 04/07/2022, no significant change.  Assessment     ICD-10-CM  1. Orthostatic hypotension  I95.1 EKG 12-Lead    2. Supine hypertension  I10       No orders of the defined types were placed in this encounter.   There are no discontinued medications.  Recommendations:   Kaylee Prevatt  is a 89 y.o. Caucasian  female patient with with supine hypertension and orthostatic hypotension, chronic stage 3a-b CKD, mild hyperlipidemia, moderate MR and TR, moderate pulmonary hypertension.  She is extremely sensitive to blood pressure variations.  1. Orthostatic hypotension Response to doing well and has not had any significant dizziness, syncope or near syncope.  She continues to remain active.  Recently she fell off of her bed and has hurt her back.  Otherwise no new findings, no new complaints.  She is tolerating all her medications well.  Recently due to pain, she had elevated blood pressure, had prescribed hydralazine to be used on a as needed basis, she will also continue to use carvedilol on a as needed basis as well extra dose.  - EKG 12-Lead  2. Supine hypertension Blood pressure is well-controlled with above therapy, she also has midodrine to be used on a as needed basis while she is on her feet.  Overall stable from cardiac standpoint, I will see her back on a as needed basis.     Yates Decamp, MD, [facility] 10/28/2022, 11:04 AM Office: [phone] Fax: [phone] Pager: [phone]

## 2022-10-28 NOTE — Telephone Encounter (Signed)
I have known the patient for many years, she is very active and independent.  I hear that she has had driving restrictions due to recent decreased cognitive function, however, functionally I feel that she is fit for driving.  But I will leave it to your expertise.  I advised the patient that I would certainly discuss this with you.  She is very compliant.

## 2022-10-29 ENCOUNTER — Encounter: Payer: Self-pay | Admitting: Neurology

## 2022-10-29 NOTE — Telephone Encounter (Signed)
Awesome, I will let her know. Thank you

## 2022-11-02 ENCOUNTER — Telehealth: Payer: Self-pay | Admitting: Rheumatology

## 2022-11-02 DIAGNOSIS — M810 Age-related osteoporosis without current pathological fracture: Secondary | ICD-10-CM

## 2022-11-02 DIAGNOSIS — Z79899 Other long term (current) drug therapy: Secondary | ICD-10-CM

## 2022-11-02 NOTE — Telephone Encounter (Signed)
Pt called asking if she could get her Prolia shot in the office rather than going to the hospital . Pt would like to know before her appt on 11/09/22

## 2022-11-03 ENCOUNTER — Other Ambulatory Visit: Payer: Self-pay

## 2022-11-03 ENCOUNTER — Other Ambulatory Visit (HOSPITAL_COMMUNITY): Payer: Self-pay

## 2022-11-03 MED ORDER — DENOSUMAB 60 MG/ML ~~LOC~~ SOSY
60.0000 mg | PREFILLED_SYRINGE | SUBCUTANEOUS | 0 refills | Status: AC
Start: 2022-11-03 — End: ?
  Filled 2022-11-03 (×2): qty 1, 180d supply, fill #0

## 2022-11-03 NOTE — Telephone Encounter (Signed)
Patient's Prolia is managed by PCP, Dr. Evlyn Kanner. She states that she is interested in receiving with our clinic. Per test claim, copay through pharmacy benefit is $95. Returned call to patient. She'd like to receive at [facility] on 11/09/2022 if possible. Rx sent to [facility] to be couriered to clinic. Provided pt with [facility] phone number and advised to pick up call as we need rx shipped ASAP once payment information is collected  It appears her last Prolia was on 11/06/2021.  She will need updated CBC, CMP, Vitamin D. She will stop by tomorrow for bloodwork  Chesley Mires, PharmD, MPH, BCPS, CPP Clinical Pharmacist (Rheumatology and Pulmonology)

## 2022-11-03 NOTE — Telephone Encounter (Signed)
Delivery instructions have been updated in [facility], medication will be couriered to [facility] on 11/08/22.  Rx has been processed in [facility] and there is a copay of $95.00. Payment information has been collected and forwarded to the pharmacy.

## 2022-11-03 NOTE — Telephone Encounter (Signed)
 Attempted to contact pt to discuss, left VoiceMail requesting a return call. Direct office number provided.

## 2022-11-05 DIAGNOSIS — M5451 Vertebrogenic low back pain: Secondary | ICD-10-CM | POA: Diagnosis not present

## 2022-11-05 NOTE — Telephone Encounter (Signed)
Prolia received in the office and placed in the fridge.  °

## 2022-11-09 ENCOUNTER — Telehealth: Payer: Self-pay

## 2022-11-09 ENCOUNTER — Encounter: Payer: Self-pay | Admitting: Rheumatology

## 2022-11-09 ENCOUNTER — Ambulatory Visit: Payer: Medicare HMO | Attending: Rheumatology | Admitting: Rheumatology

## 2022-11-09 ENCOUNTER — Encounter: Payer: Self-pay | Admitting: Cardiology

## 2022-11-09 VITALS — BP 183/77 | HR 81 | Resp 14 | Ht 64.0 in | Wt 120.0 lb

## 2022-11-09 DIAGNOSIS — R2681 Unsteadiness on feet: Secondary | ICD-10-CM

## 2022-11-09 DIAGNOSIS — E559 Vitamin D deficiency, unspecified: Secondary | ICD-10-CM

## 2022-11-09 DIAGNOSIS — F514 Sleep terrors [night terrors]: Secondary | ICD-10-CM

## 2022-11-09 DIAGNOSIS — Z79899 Other long term (current) drug therapy: Secondary | ICD-10-CM

## 2022-11-09 DIAGNOSIS — M51369 Other intervertebral disc degeneration, lumbar region without mention of lumbar back pain or lower extremity pain: Secondary | ICD-10-CM

## 2022-11-09 DIAGNOSIS — M19071 Primary osteoarthritis, right ankle and foot: Secondary | ICD-10-CM

## 2022-11-09 DIAGNOSIS — M25512 Pain in left shoulder: Secondary | ICD-10-CM

## 2022-11-09 DIAGNOSIS — M5136 Other intervertebral disc degeneration, lumbar region: Secondary | ICD-10-CM

## 2022-11-09 DIAGNOSIS — M353 Polymyalgia rheumatica: Secondary | ICD-10-CM | POA: Diagnosis not present

## 2022-11-09 DIAGNOSIS — M810 Age-related osteoporosis without current pathological fracture: Secondary | ICD-10-CM | POA: Diagnosis not present

## 2022-11-09 DIAGNOSIS — Z9889 Other specified postprocedural states: Secondary | ICD-10-CM | POA: Diagnosis not present

## 2022-11-09 DIAGNOSIS — Z8719 Personal history of other diseases of the digestive system: Secondary | ICD-10-CM

## 2022-11-09 DIAGNOSIS — M5030 Other cervical disc degeneration, unspecified cervical region: Secondary | ICD-10-CM | POA: Diagnosis not present

## 2022-11-09 DIAGNOSIS — N189 Chronic kidney disease, unspecified: Secondary | ICD-10-CM

## 2022-11-09 DIAGNOSIS — M19072 Primary osteoarthritis, left ankle and foot: Secondary | ICD-10-CM

## 2022-11-09 DIAGNOSIS — M19041 Primary osteoarthritis, right hand: Secondary | ICD-10-CM

## 2022-11-09 DIAGNOSIS — F419 Anxiety disorder, unspecified: Secondary | ICD-10-CM

## 2022-11-09 DIAGNOSIS — F513 Sleepwalking [somnambulism]: Secondary | ICD-10-CM

## 2022-11-09 DIAGNOSIS — Z8679 Personal history of other diseases of the circulatory system: Secondary | ICD-10-CM

## 2022-11-09 DIAGNOSIS — F32A Depression, unspecified: Secondary | ICD-10-CM

## 2022-11-09 DIAGNOSIS — G4489 Other headache syndrome: Secondary | ICD-10-CM

## 2022-11-09 DIAGNOSIS — M19042 Primary osteoarthritis, left hand: Secondary | ICD-10-CM

## 2022-11-09 DIAGNOSIS — G8929 Other chronic pain: Secondary | ICD-10-CM

## 2022-11-09 NOTE — Telephone Encounter (Signed)
Patient B/P was 180/80 and 183/77. Patient wants to know what she should do. Patient states she just took a hydralazine 25 mg at 4:20 should she do anything else.

## 2022-11-09 NOTE — Patient Instructions (Addendum)
Standing Labs We placed an order today for your standing lab work.   Please have your standing labs drawn in February  Please have your labs drawn 2 weeks prior to your appointment so that the provider can discuss your lab results at your appointment, if possible.  Please note that you may see your imaging and lab results in MyChart before we have reviewed them. We will contact you once all results are reviewed. Please allow our office up to 72 hours to thoroughly review all of the results before contacting the office for clarification of your results.  WALK-IN LAB HOURS  Monday through Thursday from 8:00 am -12:30 pm and 1:00 pm-5:00 pm and Friday from 8:00 am-12:00 pm.  Patients with office visits requiring labs will be seen before walk-in labs.  You may encounter longer than normal wait times. Please allow additional time. Wait times may be shorter on  Monday and Thursday afternoons.  We do not book appointments for walk-in labs. We appreciate your patience and understanding with our staff.   Labs are drawn by [facility]. Please bring your co-pay at the time of your lab draw.  You may receive a bill from [facility] for your lab work.  Please note if you are on Hydroxychloroquine and and an order has been placed for a Hydroxychloroquine level,  you will need to have it drawn 4 hours or more after your last dose.  If you wish to have your labs drawn at another location, please call the office 24 hours in advance so we can fax the orders.  The office is located at [address], [address], [place], [place] [address]   If you have any questions regarding directions or hours of operation,  please call [phone].   As a reminder, please drink plenty of water prior to coming for your lab work. Thanks!   Vaccines You are taking a medication(s) that can suppress your immune system.  The following immunizations are recommended: Flu annually Covid-19  RSV Td/Tdap (tetanus, diphtheria,  pertussis) every 10 years Pneumonia (Prevnar 15 then Pneumovax 23 at least 1 year apart.  Alternatively, can take Prevnar 20 without needing additional dose) Shingrix: 2 doses from 4 weeks to 6 months apart  Please check with your PCP to make sure you are up to date.

## 2022-11-09 NOTE — Telephone Encounter (Signed)
Hydralazine 50 mg q 4 hours. Also can use Carvedilol two tablets of 3.125 mg as needed.

## 2022-11-10 ENCOUNTER — Telehealth: Payer: Self-pay | Admitting: Rheumatology

## 2022-11-10 LAB — COMPLETE METABOLIC PANEL WITH GFR
AG Ratio: 1.8 (calc) (ref 1.0–2.5)
ALT: 14 U/L (ref 6–29)
AST: 22 U/L (ref 10–35)
Albumin: 4.6 g/dL (ref 3.6–5.1)
Alkaline phosphatase (APISO): 52 U/L (ref 37–153)
BUN/Creatinine Ratio: 22 (calc) (ref 6–22)
BUN: 25 mg/dL (ref 7–25)
CO2: 28 mmol/L (ref 20–32)
Calcium: 9.9 mg/dL (ref 8.6–10.4)
Chloride: 103 mmol/L (ref 98–110)
Creat: 1.15 mg/dL — ABNORMAL HIGH (ref 0.60–0.95)
Globulin: 2.6 g/dL (ref 1.9–3.7)
Glucose, Bld: 92 mg/dL (ref 65–99)
Potassium: 4.5 mmol/L (ref 3.5–5.3)
Sodium: 141 mmol/L (ref 135–146)
Total Bilirubin: 1 mg/dL (ref 0.2–1.2)
Total Protein: 7.2 g/dL (ref 6.1–8.1)
eGFR: 46 mL/min/{1.73_m2} — ABNORMAL LOW (ref >=60)

## 2022-11-10 LAB — CBC WITH DIFFERENTIAL/PLATELET
Absolute Monocytes: 447 {cells}/uL (ref 200–950)
Basophils Absolute: 21 {cells}/uL (ref 0–200)
Basophils Relative: 0.4 %
Eosinophils Absolute: 187 {cells}/uL (ref 15–500)
Eosinophils Relative: 3.6 %
HCT: 30.4 % — ABNORMAL LOW (ref 35.0–45.0)
Hemoglobin: 10.3 g/dL — ABNORMAL LOW (ref 11.7–15.5)
Lymphs Abs: 1134 {cells}/uL (ref 850–3900)
MCH: 32.9 pg (ref 27.0–33.0)
MCHC: 33.9 g/dL (ref 32.0–36.0)
MCV: 97.1 fL (ref 80.0–100.0)
MPV: 10.4 fL (ref 7.5–12.5)
Monocytes Relative: 8.6 %
Neutro Abs: 3411 {cells}/uL (ref 1500–7800)
Neutrophils Relative %: 65.6 %
Platelets: 205 10*3/uL (ref 140–400)
RBC: 3.13 10*6/uL — ABNORMAL LOW (ref 3.80–5.10)
RDW: 12.2 % (ref 11.0–15.0)
Total Lymphocyte: 21.8 %
WBC: 5.2 10*3/uL (ref 3.8–10.8)

## 2022-11-10 LAB — VITAMIN D 25 HYDROXY (VIT D DEFICIENCY, FRACTURES): Vit D, 25-Hydroxy: 81 ng/mL (ref 30–100)

## 2022-11-10 NOTE — Telephone Encounter (Signed)
Patient called requesting a return call regarding her appointment with [facility] tomorrow 11/11/22 for her Prolia.  Patient states she is confused why it is scheduled for tomorrow because she was told that she needed to take her calcium at least 1 week before she can get her injection.

## 2022-11-10 NOTE — Telephone Encounter (Signed)
Called no voicemail

## 2022-11-10 NOTE — Telephone Encounter (Signed)
Returned call to patient. Her calcium was wnl to proceed with Prolia. Patient misinterpreted Dr. Fatima Sanger explanation of lab timing with Prolia administration   Chesley Mires, PharmD, MPH, BCPS, CPP Clinical Pharmacist (Rheumatology and Pulmonology)

## 2022-11-10 NOTE — Telephone Encounter (Signed)
Pt called stating she went to schedule her bone density scan. The machine is down at their office and she will have to wait a couple of weeks to schedule an appt with them. Pt wanted to let the office know of this.

## 2022-11-10 NOTE — Progress Notes (Signed)
Hemoglobin is low and stable.  Creatinine is high and stable.  Please forward results to her PCP.  Patient should take multivitamin with iron.

## 2022-11-11 ENCOUNTER — Ambulatory Visit: Payer: Medicare HMO | Attending: Rheumatology | Admitting: Pharmacist

## 2022-11-11 VITALS — BP 162/73 | HR 81

## 2022-11-11 DIAGNOSIS — M810 Age-related osteoporosis without current pathological fracture: Secondary | ICD-10-CM | POA: Diagnosis not present

## 2022-11-11 DIAGNOSIS — Z7689 Persons encountering health services in other specified circumstances: Secondary | ICD-10-CM

## 2022-11-11 MED ORDER — DENOSUMAB 60 MG/ML ~~LOC~~ SOSY
60.0000 mg | PREFILLED_SYRINGE | Freq: Once | SUBCUTANEOUS | Status: AC
  Administered 2022-11-11: 60 mg via SUBCUTANEOUS

## 2022-11-11 NOTE — Patient Instructions (Signed)
Iron-Rich Diet  Iron is a mineral that helps your body produce hemoglobin. Hemoglobin is a protein in red blood cells that carries oxygen to your body's tissues. Eating too little iron may cause you to feel weak and tired, and it can increase your risk of infection. Iron is naturally found in many foods, and many foods have iron added to them (are iron-fortified). You may need to follow an iron-rich diet if you do not have enough iron in your body due to certain medical conditions. The amount of iron that you need each day depends on your age, your sex, and any medical conditions you have. Follow instructions from your health care provider or a dietitian about how much iron you should eat each day. What are tips for following this plan? Reading food labels Check food labels to see how many milligrams (mg) of iron are in each serving. Cooking Cook foods in pots and pans that are made from iron. Take these steps to make it easier for your body to absorb iron from certain foods: Soak beans overnight before cooking. Soak whole grains overnight and drain them before using. Ferment flours before baking, such as by using yeast in bread dough. Meal planning When you eat foods that contain iron, you should eat them with foods that are high in vitamin C. These include oranges, peppers, tomatoes, potatoes, and mangoes. Vitamin C helps your body absorb iron. Certain foods and drinks prevent your body from absorbing iron properly. Avoid eating these foods in the same meal as iron-rich foods or with iron supplements. These foods include: Coffee, black tea, and red wine. Milk, dairy products, and foods that are high in calcium. Beans and soybeans. Whole grains. General information Take iron supplements only as told by your health care provider. An overdose of iron can be life-threatening. If you were prescribed iron supplements, take them with orange juice or a vitamin C supplement. When you eat  iron-fortified foods or take an iron supplement, you should also eat foods that naturally contain iron, such as meat, poultry, and fish. Eating naturally iron-rich foods helps your body absorb the iron that is added to other foods or contained in a supplement. Iron from animal sources is better absorbed than iron from plant sources. What foods should I eat? Fruits Prunes. Raisins. Eat fruits high in vitamin C, such as oranges, grapefruits, and strawberries, with iron-rich foods. Vegetables Spinach (cooked). Green peas. Broccoli. Fermented vegetables. Eat vegetables high in vitamin C, such as leafy greens, potatoes, bell peppers, and tomatoes, with iron-rich foods. Grains Iron-fortified breakfast cereal. Iron-fortified whole-wheat bread. Enriched rice. Sprouted grains. Meats and other proteins Beef liver. Beef. [place]. Chicken. Oysters. Shrimp. Tuna. Sardines. Chickpeas. Nuts. Tofu. Pumpkin seeds. Beverages Tomato juice. Fresh orange juice. Prune juice. Hibiscus tea. Iron-fortified instant breakfast shakes. Sweets and desserts Blackstrap molasses. Seasonings and condiments Tahini. Fermented soy sauce. Other foods Wheat germ. The items listed above may not be a complete list of recommended foods and beverages. Contact a dietitian for more information. What foods should I limit? These are foods that should be limited while eating iron-rich foods as they can reduce the absorption of iron in your body. Grains Whole grains. Bran cereal. Bran flour. Meats and other proteins Soybeans. Products made from soy protein. Black beans. Lentils. Mung beans. Split peas. Dairy Milk. Cream. Cheese. Yogurt. Cottage cheese. Beverages Coffee. Black tea. Red wine. Sweets and desserts Cocoa. Chocolate. Ice cream. Seasonings and condiments Basil. Oregano. Large amounts of parsley. The items listed  above may not be a complete list of foods and beverages you should limit. Contact a dietitian for more  information. Summary Iron is a mineral that helps your body produce hemoglobin. Hemoglobin is a protein in red blood cells that carries oxygen to your body's tissues. Iron is naturally found in many foods, and many foods have iron added to them (are iron-fortified). When you eat foods that contain iron, you should eat them with foods that are high in vitamin C. Vitamin C helps your body absorb iron. Certain foods and drinks prevent your body from absorbing iron properly, such as whole grains and dairy products. You should avoid eating these foods in the same meal as iron-rich foods or with iron supplements. This information is not intended to replace advice given to you by your health care provider. Make sure you discuss any questions you have with your health care provider. Document Revised: 01/28/2020 Document Reviewed: 01/28/2020 Elsevier Patient Education  2024 [employer].

## 2022-11-11 NOTE — Progress Notes (Signed)
Pharmacy Note  Subjective:   Patient presents to clinic today to receive bi-annual dose of Prolia. Patient's last dose of Prolia was on 11/06/2021 (from Epic records)  Patient running a fever or have signs/symptoms of infection? No  Patient currently on antibiotics for the treatment of infection? No  Patient had fall in the last 6 months?  Yes  If yes, did it require medical attention? Yes (EMS was called to home after patient fell from bed). Patient does have history of falls at home   Patient taking calcium 1200 mg daily through diet or supplement and at least 800 units vitamin D? Yes  Objective: CMP     Component Value Date/Time   NA 141 11/09/2022 1531   NA 137 12/29/2021 1450   K 4.5 11/09/2022 1531   CL 103 11/09/2022 1531   CO2 28 11/09/2022 1531   GLUCOSE 92 11/09/2022 1531   BUN 25 11/09/2022 1531   BUN 21 12/29/2021 1450   CREATININE 1.15 (H) 11/09/2022 1531   CALCIUM 9.9 11/09/2022 1531   PROT 7.2 11/09/2022 1531   PROT 6.9 12/29/2021 1450   ALBUMIN 4.5 12/29/2021 1450   AST 22 11/09/2022 1531   ALT 14 11/09/2022 1531   ALKPHOS 53 12/29/2021 1450   BILITOT 1.0 11/09/2022 1531   BILITOT 0.6 12/29/2021 1450   GFRNONAA 30 (L) 05/16/2021 1817   GFRNONAA 41 (L) 08/20/2020 1544   GFRAA 48 (L) 08/20/2020 1544    CBC    Component Value Date/Time   WBC 5.2 11/09/2022 1531   RBC 3.13 (L) 11/09/2022 1531   HGB 10.3 (L) 11/09/2022 1531   HGB 10.5 (L) 12/29/2021 1450   HCT 30.4 (L) 11/09/2022 1531   HCT 31.0 (L) 12/29/2021 1450   PLT 205 11/09/2022 1531   PLT 187 12/29/2021 1450   MCV 97.1 11/09/2022 1531   MCV 95 12/29/2021 1450   MCH 32.9 11/09/2022 1531   MCHC 33.9 11/09/2022 1531   RDW 12.2 11/09/2022 1531   RDW 11.9 12/29/2021 1450   LYMPHSABS 1,134 11/09/2022 1531   LYMPHSABS 1.0 12/29/2021 1450   MONOABS 0.4 05/16/2021 1817   EOSABS 187 11/09/2022 1531   EOSABS 0.2 12/29/2021 1450   BASOSABS 21 11/09/2022 1531   BASOSABS 0.0 12/29/2021 1450    Lab  Results  Component Value Date   VD25OH 81 11/09/2022    T-score: 04/13/2018- Left femoral T score was -2.3, BMD 0.591   Assessment/Plan:   Reviewed importance of adequate dietary intake of calcium in addition to supplementation due to risk of hypocalcemia with Prolia.   Patient tolerated injection well.   Administrations This Visit     denosumab (PROLIA) injection 60 mg     Admin Date 11/11/2022 Action Given Dose 60 mg Route Subcutaneous Documented By Murrell Redden, RPH-CPP           Patient's next Prolia dose is due on 05/10/23.  Patient is due for updated DEXA and has been ordered at last OV   All questions encouraged and answered.  Instructed patient to call with any further questions or concerns.  Chesley Mires, PharmD, MPH, BCPS, CPP Clinical Pharmacist (Rheumatology and Pulmonology)

## 2022-11-15 ENCOUNTER — Ambulatory Visit: Payer: Medicare HMO | Admitting: Adult Health

## 2022-11-16 DIAGNOSIS — L821 Other seborrheic keratosis: Secondary | ICD-10-CM | POA: Diagnosis not present

## 2022-11-16 DIAGNOSIS — L814 Other melanin hyperpigmentation: Secondary | ICD-10-CM | POA: Diagnosis not present

## 2022-11-16 DIAGNOSIS — Z85828 Personal history of other malignant neoplasm of skin: Secondary | ICD-10-CM | POA: Diagnosis not present

## 2022-11-16 DIAGNOSIS — L503 Dermatographic urticaria: Secondary | ICD-10-CM | POA: Diagnosis not present

## 2022-11-16 DIAGNOSIS — D225 Melanocytic nevi of trunk: Secondary | ICD-10-CM | POA: Diagnosis not present

## 2022-11-16 DIAGNOSIS — Z08 Encounter for follow-up examination after completed treatment for malignant neoplasm: Secondary | ICD-10-CM | POA: Diagnosis not present

## 2022-11-16 DIAGNOSIS — L905 Scar conditions and fibrosis of skin: Secondary | ICD-10-CM | POA: Diagnosis not present

## 2022-11-16 NOTE — Telephone Encounter (Signed)
Called patient - no answer LVM

## 2022-11-17 ENCOUNTER — Other Ambulatory Visit: Payer: Self-pay | Admitting: Physician Assistant

## 2022-11-17 NOTE — Telephone Encounter (Signed)
Last Fill: 08/30/2022  Eye exam: 10/19/2021 normal. Patient is awaiting a phone call from new eye doctor to schedule PLQ eye exam.  Labs: 11/09/2022 Hemoglobin is low and stable.  Creatinine is high and stable.     Next Visit: 04/12/2022  Last Visit: 11/09/2022  DX:PMR (polymyalgia rheumatica)   Current Dose per office note 11/09/2022: Plaquenil 200 mg p.o. every other day   Okay to refill Plaquenil?

## 2022-12-01 DIAGNOSIS — M353 Polymyalgia rheumatica: Secondary | ICD-10-CM | POA: Diagnosis not present

## 2022-12-01 DIAGNOSIS — H04123 Dry eye syndrome of bilateral lacrimal glands: Secondary | ICD-10-CM | POA: Diagnosis not present

## 2022-12-01 DIAGNOSIS — H518 Other specified disorders of binocular movement: Secondary | ICD-10-CM | POA: Diagnosis not present

## 2022-12-01 DIAGNOSIS — Z961 Presence of intraocular lens: Secondary | ICD-10-CM | POA: Diagnosis not present

## 2022-12-01 DIAGNOSIS — Z79899 Other long term (current) drug therapy: Secondary | ICD-10-CM | POA: Diagnosis not present

## 2022-12-08 DIAGNOSIS — Z0100 Encounter for examination of eyes and vision without abnormal findings: Secondary | ICD-10-CM | POA: Diagnosis not present

## 2022-12-10 ENCOUNTER — Other Ambulatory Visit: Payer: Self-pay | Admitting: Cardiology

## 2022-12-10 DIAGNOSIS — I10 Essential (primary) hypertension: Secondary | ICD-10-CM

## 2022-12-17 DIAGNOSIS — M6281 Muscle weakness (generalized): Secondary | ICD-10-CM | POA: Diagnosis not present

## 2022-12-28 DIAGNOSIS — M6281 Muscle weakness (generalized): Secondary | ICD-10-CM | POA: Diagnosis not present

## 2022-12-28 DIAGNOSIS — M810 Age-related osteoporosis without current pathological fracture: Secondary | ICD-10-CM | POA: Diagnosis not present

## 2022-12-28 DIAGNOSIS — M353 Polymyalgia rheumatica: Secondary | ICD-10-CM | POA: Diagnosis not present

## 2022-12-28 DIAGNOSIS — R7302 Impaired glucose tolerance (oral): Secondary | ICD-10-CM | POA: Diagnosis not present

## 2022-12-28 DIAGNOSIS — E039 Hypothyroidism, unspecified: Secondary | ICD-10-CM | POA: Diagnosis not present

## 2022-12-28 DIAGNOSIS — I951 Orthostatic hypotension: Secondary | ICD-10-CM | POA: Diagnosis not present

## 2022-12-28 DIAGNOSIS — I129 Hypertensive chronic kidney disease with stage 1 through stage 4 chronic kidney disease, or unspecified chronic kidney disease: Secondary | ICD-10-CM | POA: Diagnosis not present

## 2022-12-28 DIAGNOSIS — R413 Other amnesia: Secondary | ICD-10-CM | POA: Diagnosis not present

## 2022-12-28 DIAGNOSIS — N1832 Chronic kidney disease, stage 3b: Secondary | ICD-10-CM | POA: Diagnosis not present

## 2023-01-03 DIAGNOSIS — M6281 Muscle weakness (generalized): Secondary | ICD-10-CM | POA: Diagnosis not present

## 2023-01-07 DIAGNOSIS — N183 Chronic kidney disease, stage 3 unspecified: Secondary | ICD-10-CM | POA: Diagnosis not present

## 2023-01-07 DIAGNOSIS — M6281 Muscle weakness (generalized): Secondary | ICD-10-CM | POA: Diagnosis not present

## 2023-01-11 ENCOUNTER — Other Ambulatory Visit: Payer: Self-pay | Admitting: Neurology

## 2023-01-11 DIAGNOSIS — N1832 Chronic kidney disease, stage 3b: Secondary | ICD-10-CM | POA: Diagnosis not present

## 2023-01-11 DIAGNOSIS — N281 Cyst of kidney, acquired: Secondary | ICD-10-CM | POA: Diagnosis not present

## 2023-01-11 DIAGNOSIS — I129 Hypertensive chronic kidney disease with stage 1 through stage 4 chronic kidney disease, or unspecified chronic kidney disease: Secondary | ICD-10-CM | POA: Diagnosis not present

## 2023-01-11 DIAGNOSIS — M353 Polymyalgia rheumatica: Secondary | ICD-10-CM | POA: Diagnosis not present

## 2023-01-11 DIAGNOSIS — K219 Gastro-esophageal reflux disease without esophagitis: Secondary | ICD-10-CM | POA: Diagnosis not present

## 2023-01-11 NOTE — Telephone Encounter (Signed)
She is only taking 0.25 mg Klonopin at bedtime for parasomnia behavior. If she needs to discontinue for another medication she can stop. Of note, she should have some of the 0.125 mg tablets from back in August, she could take for for 1 week then stop to be extra careful. But yes, if she needs to come off the Klonopin quickly she should be okay to stop current dosing.

## 2023-01-11 NOTE — Telephone Encounter (Signed)
Call to patient and she verbalized she will only take the klonopin ta bedtime and only take 1/2 tablet.

## 2023-01-11 NOTE — Telephone Encounter (Signed)
Call to patient and she states that dermatologist PA prescribed levocetirzine 5mg  at bedtime for itching scalp. The pharmacist told her they both will make sure drowsy and she wants to make sure she can take then both. Advised I will send back to sarah with updated information. Patient appreciative

## 2023-01-11 NOTE — Telephone Encounter (Signed)
Pt is needing to discuss her clonazePAM (KLONOPIN) 0.5 MG tablet with RN or MD She states that her dermatologist put her on a pill and advised her to stop the clonazePAM (KLONOPIN) 0.5 MG tablet for a while and she is wanting to know if that is ok to stop abruptly.

## 2023-01-11 NOTE — Telephone Encounter (Signed)
Both could be CNS depressants when taken together use caution.

## 2023-01-13 LAB — LAB REPORT - SCANNED: Creatinine, POC: 190.1 mg/dL

## 2023-01-17 DIAGNOSIS — M6281 Muscle weakness (generalized): Secondary | ICD-10-CM | POA: Diagnosis not present

## 2023-01-21 DIAGNOSIS — M6281 Muscle weakness (generalized): Secondary | ICD-10-CM | POA: Diagnosis not present

## 2023-01-24 ENCOUNTER — Other Ambulatory Visit: Payer: Self-pay | Admitting: Cardiology

## 2023-01-24 DIAGNOSIS — M6281 Muscle weakness (generalized): Secondary | ICD-10-CM | POA: Diagnosis not present

## 2023-01-24 DIAGNOSIS — I10 Essential (primary) hypertension: Secondary | ICD-10-CM

## 2023-01-31 ENCOUNTER — Telehealth: Payer: Self-pay | Admitting: Cardiology

## 2023-01-31 NOTE — Telephone Encounter (Signed)
Spoke with patient and she this morning when she took her BP it was 80/50 at 758. She was standing when she took her BP.  She was asymptomatic.   While on the phone she took her BP sitting it was  170/72 hr 93 @917  and standing it was 126/72 hr 98 @ 922. She is still asymptomatic.   Did advise to stay hydrated, continue to monitor BP. Will forward to provider. ED precautions discussed.

## 2023-01-31 NOTE — Telephone Encounter (Signed)
Pt c/o BP issue: STAT if pt c/o blurred vision, one-sided weakness or slurred speech  1. What are your last 5 BP readings? 80/50  2. Are you having any other symptoms (ex. Dizziness, headache, blurred vision, passed out)? No  3. What is your BP issue?  Patient states that she woke up this morning with a lower BP reading and would like a call back to discuss further.

## 2023-02-01 NOTE — Telephone Encounter (Signed)
Patient is calling to follow up

## 2023-02-01 NOTE — Telephone Encounter (Signed)
Agree with the assessment. She is very sensitive to BP medications and her BP is precarious due to orthostatic hypotension. She needs pain management

## 2023-02-01 NOTE — Telephone Encounter (Signed)
Called pt advised of MD response: Agree with the assessment. She is very sensitive to BP medications and her BP is precarious due to orthostatic hypotension. She needs pain management   Advised to call back if she has any further BP concerns. Pt expresses understanding.

## 2023-02-01 NOTE — Telephone Encounter (Signed)
Spoke with pt in regards to BP f/u.  Reports has not received a call back from yesterday's call.   Reports BP 170/72-93 on 01/31/23.  Today 117/60-102.  Denies dizziness, SOB, HA.  Reports increased pain d/t back.  Advised pt pain can cause an increase in BP.  Advised pt to reach out to PCP for pain management.  Reports took tylenol 325 mg PO last night and it seemed to help some.  Advised pt may need to take tylenol for a couple of nights to see if pain level and BP improve.  Pt is willing to try taking tylenol.  Advised pt we don't want to treat 1 elevated BP reading.  Will need to see that BP is consistently elevated before making changes. Pt expresses understanding but would like MD input.  Advised will send message to MD but continue to monitor BP and pain level.

## 2023-02-02 DIAGNOSIS — M6281 Muscle weakness (generalized): Secondary | ICD-10-CM | POA: Diagnosis not present

## 2023-02-04 DIAGNOSIS — M6281 Muscle weakness (generalized): Secondary | ICD-10-CM | POA: Diagnosis not present

## 2023-02-07 DIAGNOSIS — M6281 Muscle weakness (generalized): Secondary | ICD-10-CM | POA: Diagnosis not present

## 2023-02-11 DIAGNOSIS — M6281 Muscle weakness (generalized): Secondary | ICD-10-CM | POA: Diagnosis not present

## 2023-02-14 DIAGNOSIS — M6281 Muscle weakness (generalized): Secondary | ICD-10-CM | POA: Diagnosis not present

## 2023-02-16 DIAGNOSIS — H6123 Impacted cerumen, bilateral: Secondary | ICD-10-CM | POA: Diagnosis not present

## 2023-02-18 DIAGNOSIS — M5451 Vertebrogenic low back pain: Secondary | ICD-10-CM | POA: Diagnosis not present

## 2023-02-18 DIAGNOSIS — M6281 Muscle weakness (generalized): Secondary | ICD-10-CM | POA: Diagnosis not present

## 2023-02-21 DIAGNOSIS — M6281 Muscle weakness (generalized): Secondary | ICD-10-CM | POA: Diagnosis not present

## 2023-02-28 DIAGNOSIS — M6281 Muscle weakness (generalized): Secondary | ICD-10-CM | POA: Diagnosis not present

## 2023-03-04 DIAGNOSIS — M6281 Muscle weakness (generalized): Secondary | ICD-10-CM | POA: Diagnosis not present

## 2023-03-07 DIAGNOSIS — M6281 Muscle weakness (generalized): Secondary | ICD-10-CM | POA: Diagnosis not present

## 2023-03-09 ENCOUNTER — Other Ambulatory Visit: Payer: Self-pay

## 2023-03-10 MED ORDER — CLONAZEPAM 0.5 MG PO TABS: 0.2500 mg | ORAL_TABLET | Freq: Every day | ORAL | 5 refills | Status: DC

## 2023-03-10 NOTE — Addendum Note (Signed)
 Addended by: Judi Cong on: 03/10/2023 10:41 AM   Modules accepted: Orders

## 2023-03-10 NOTE — Addendum Note (Signed)
 Addended by: Melvyn Novas on: 03/10/2023 01:22 PM   Modules accepted: Orders

## 2023-03-11 DIAGNOSIS — M6281 Muscle weakness (generalized): Secondary | ICD-10-CM | POA: Diagnosis not present

## 2023-03-14 ENCOUNTER — Encounter: Payer: Self-pay | Admitting: Podiatry

## 2023-03-14 ENCOUNTER — Ambulatory Visit: Payer: Medicare HMO | Admitting: Podiatry

## 2023-03-14 ENCOUNTER — Other Ambulatory Visit: Payer: Self-pay | Admitting: *Deleted

## 2023-03-14 DIAGNOSIS — M2041 Other hammer toe(s) (acquired), right foot: Secondary | ICD-10-CM

## 2023-03-14 DIAGNOSIS — M6281 Muscle weakness (generalized): Secondary | ICD-10-CM | POA: Diagnosis not present

## 2023-03-14 NOTE — Progress Notes (Signed)
 HPI: 89 y.o. female presenting today for follow-up evaluation of symptomatic calluses overlying the fifth toe of the right foot specifically.  She says that today her left fifth toe is doing well but she is experiencing some pain and tenderness to the ball of the foot now.  Onset about 1-2 weeks ago.  She presents for follow-up treatment evaluation  Past Medical History:  Diagnosis Date   Arthritis    Complication of anesthesia    Constipation    Depression    GERD (gastroesophageal reflux disease)    Hypertension    Hypothyroidism    Osteoporosis    Pneumonia 2018   Polymyalgia (HCC)    Polymyalgia rheumatica (HCC)    PONV (postoperative nausea and vomiting)     Past Surgical History:  Procedure Laterality Date   BASAL CELL CARCINOMA EXCISION  2022   on face   COLONOSCOPY W/ POLYPECTOMY     EYE SURGERY     both cataracts   LUMBAR LAMINECTOMY/DECOMPRESSION MICRODISCECTOMY N/A 08/15/2018   Procedure: Lumbar three to Lumbar five Decompressive lumbar laminectomy;  Surgeon: Unice Pac, MD;  Location: [facility] OR;  Service: Neurosurgery;  Laterality: N/A;   NASAL SINUS SURGERY     SHOULDER SURGERY Right 2007   repair   TONSILLECTOMY     TRIGGER FINGER RELEASE  03/19/2011   Procedure:  left middle finger RELEASE TRIGGER FINGER/A-1 PULLEY;  Surgeon: Lamar LULLA Leonor Mickey., MD;  Location: [facility];  Service: Orthopedics;  Laterality: Right;  Procedure:  Release Right Long and Ring Trigger Fingers, Release Left Long Trigger Finger, Injection Left Long Proximal Phalangeal Joint   TRIGGER FINGER RELEASE Right    Ring finger, middle finger   UPPER GASTROINTESTINAL ENDOSCOPY      No Known Allergies   Physical Exam: General: The patient is alert and oriented x3 in no acute distress.  Dermatology: Symptomatic calluses noted to the medial aspect of the bilateral fifth digits adjacent to the fourth toe  Vascular: Palpable pedal pulses bilaterally. Capillary refill within  normal limits.  Negative for any significant edema or erythema  Neurological: Light touch and protective threshold grossly intact  Musculoskeletal Exam: There is some tenderness with palpation and range of motion of the second and third MTP of the left foot.  Adductovarus hammertoe deformity also noted with prominent head of the proximal phalanx noted to the fifth digit of the right foot   Assessment: 1.  Symptomatic calluses medial aspect of the fifth digit adjacent to the fourth toe bilateral 2.  Metatarsalgia left second and third MTP  Plan of Care:  -Patient evaluated.  -Excisional debridement of the symptomatic callus lesion to the medial aspect of the fifth digit bilateral was performed using a 312 blade. -Silicone toe sleeve was dispensed to wear over the fifth toe of the right foot.  -Stressed the importance of wearing wide fitting shoes that do not irritate the toes.  Advised patient against wearing any narrow close toed shoes or sandals that have straps across the toes. -Metatarsal pads were also applied to the insoles of the left shoe to offload pressure from the forefoot.  This should help alleviate some of her metatarsalgia she is experiencing to the foot -She declined cortisone injection to the left foot. -No NSAIDs secondary to HTN -RTC as needed     Thresa EMERSON Sar, DPM [facility]  Dr. Thresa EMERSON Sar, DPM    [address]  [place], [place] [address]                Office [phone]  Fax [phone]

## 2023-03-16 ENCOUNTER — Telehealth: Payer: Self-pay | Admitting: Neurology

## 2023-03-16 NOTE — Telephone Encounter (Signed)
 Pt called stating that she usually gets a 90 day supply of her clonazePAM  (KLONOPIN ) 0.5 MG tablet and this time around she received only 15 pills and it came with a copay when she usually does not have a copay.. Pt is needing this looked into. Please advise.

## 2023-03-16 NOTE — Telephone Encounter (Signed)
 Called and informed pt to [facility] as we sent 30 tabs w/5 refills.  Pt educated that 15 tablets is a one month supply and since controlled substance .   Sig - Route: Take 0.5 tablets (0.25 mg total) by mouth at bedtime. - Oral   Sent to pharmacy as: clonazePAM  (KLONOPIN ) 0.5 MG tablet   E-Prescribing Status: Receipt confirmed by pharmacy (03/10/2023  1:22 PM EST)     Dispenses    Dispensed Days Supply Quantity Provider Pharmacy  CLONAZEPAM  0.5 MG TABLET 03/10/2023 30  Dohmeier, Raoul Byes, MD [facility] Mail D...  clonazepam  0.5 mg tablet 03/10/2023 30 15 tablet Dohmeier, Raoul Byes, MD [facility] Ma...  clonazepam  0.125 mg disintegrating tablet 10/29/2022 30 30 each Dohmeier, Raoul Byes, MD [facility] Ma...  CLONAZEPAM  0.125 MG DIS TAB 10/05/2022 30  Dohmeier, Raoul Byes, MD [facility] Mail D...  clonazepam  0.125 mg disintegrating tablet 10/05/2022 30 30 each Dohmeier, Raoul Byes, MD [facility] Ma...  CLONAZEPAM  0.125 MG DIS TAB 09/03/2022 30  Dohmeier, Raoul Byes, MD [facility] Mail D...  clonazepam  0.125 mg disintegrating tablet 09/03/2022 30 30 each Dohmeier, Raoul Byes, MD [facility] Ma...  CLONAZEPAM  0.5 MG TABLET 09/01/2022 30  Dohmeier, Raoul Byes, MD [facility] Mail D...  clonazepam  0.5 mg tablet 09/01/2022 30 30 tablet Dohmeier, Raoul Byes, MD [facility] Ma...  clonazePAM  (KLONOPIN )  tablet 08/31/2022 30 30 Dohmeier, Raoul Byes, MD   CLONAZEPAM  0.5 MG TABLET 05/11/2022 90 90 each Dohmeier, Raoul Byes, MD [facility] [phone] - G.Aaron AasAaron Aas

## 2023-03-18 DIAGNOSIS — M6281 Muscle weakness (generalized): Secondary | ICD-10-CM | POA: Diagnosis not present

## 2023-03-21 DIAGNOSIS — Z1231 Encounter for screening mammogram for malignant neoplasm of breast: Secondary | ICD-10-CM | POA: Diagnosis not present

## 2023-03-21 DIAGNOSIS — M6281 Muscle weakness (generalized): Secondary | ICD-10-CM | POA: Diagnosis not present

## 2023-03-25 DIAGNOSIS — M6281 Muscle weakness (generalized): Secondary | ICD-10-CM | POA: Diagnosis not present

## 2023-03-31 NOTE — Progress Notes (Signed)
Office Visit Note  Patient: Gloria Anderson             Date of Birth: 06-15-1933           MRN: [id number]             PCP: Adrian Prince, MD Referring: Adrian Prince, MD Visit Date: 04/13/2023 Occupation: @GUAROCC @  Subjective:  Medication management  History of Present Illness: Lamaria Hildebrandt is a 89 y.o. female with polymyalgia rheumatica, osteoarthritis and osteoporosis.  She returns today after last visit in September 2024.  She states she is very disappointed as she is not allowed to drive anymore.  She states she has never had an accident or never been confused when she is driving.  She has lived in [place] for many years and has no difficulty finding her base.  She denies any flares of polymyalgia rheumatica.  She denies any increased muscular weakness or tenderness.  [facility]  She states her lower back pain has improved.    Activities of Daily Living:  Patient reports morning stiffness for 0 minute.   Patient Denies nocturnal pain.  Difficulty dressing/grooming: Denies Difficulty climbing stairs: Denies Difficulty getting out of chair: Denies Difficulty using hands for taps, buttons, cutlery, and/or writing: Reports  Review of Systems  Constitutional:  Negative for fatigue.  HENT:  Positive for mouth dryness. Negative for mouth sores.   Eyes:  Positive for dryness.  Respiratory:  Negative for shortness of breath.   Cardiovascular:  Negative for chest pain and palpitations.  Gastrointestinal:  Positive for blood in stool and constipation. Negative for diarrhea.  Endocrine: Positive for increased urination.  Genitourinary:  Negative for involuntary urination.  Musculoskeletal:  Positive for gait problem. Negative for joint pain, joint pain, joint swelling, myalgias, muscle weakness, morning stiffness, muscle tenderness and myalgias.  Skin:  Negative for color change, rash, hair loss and sensitivity to sunlight.   Allergic/Immunologic: Negative for susceptible to infections.  Neurological:  Negative for dizziness and headaches.  Hematological:  Negative for swollen glands.  Psychiatric/Behavioral:  Positive for depressed mood and sleep disturbance. The patient is not nervous/anxious.     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HCC) 05/23/2019   Nocturnal seizures (HCC) 05/23/2019   Adult night terrors 01/09/2019   Degenerative lumbar spinal stenosis 08/15/2018   Memory difficulty 07/26/2017   Other parasomnia 07/26/2017   Anemia 06/02/2017   Osteopenia 06/02/2017   Spinal stenosis 06/02/2017   Iron deficiency anemia secondary to inadequate dietary iron intake 08/05/2016   Fatigue associated with anemia 08/05/2016   Primary osteoarthritis of both feet 02/04/2016   Supine hypertension 02/04/2016   PMR (polymyalgia rheumatica) (HCC) 02/03/2016   Osteoarthritis, hand 02/03/2016   Age-related osteoporosis without current pathological fracture 02/03/2016   DDD (degenerative disc disease), lumbar 02/03/2016   High risk medication use 02/03/2016   Parasomnia, organic 08/04/2015   Parasomnia due to medical condition 02/03/2015   Cerebral cavernous malformation type 1 12/04/2013   Night terrors, adult 09/26/2013  HEMORRHOIDS-EXTERNAL 09/19/2009   GERD 09/19/2009   CONSTIPATION 09/19/2009   DYSPHAGIA 09/19/2009   History of colonic polyps 09/19/2009    Past Medical History:  Diagnosis Date   Arthritis    Complication of  anesthesia    Constipation    Depression    GERD (gastroesophageal reflux disease)    Hypertension    Hypothyroidism    Osteoporosis    Pneumonia 2018   Polymyalgia (HCC)    Polymyalgia rheumatica (HCC)    PONV (postoperative nausea and vomiting)     Family History  Problem Relation Age of Onset   Tuberculosis Mother 84   CVA Father    Stroke Father 67   Hypertension Sister 9   Lung cancer Brother 62   Hypertension Son    Past Surgical History:  Procedure Laterality Date   BASAL CELL CARCINOMA EXCISION  2022   on face   COLONOSCOPY W/ POLYPECTOMY     EYE SURGERY     both cataracts   LUMBAR LAMINECTOMY/DECOMPRESSION MICRODISCECTOMY N/A 08/15/2018   Procedure: Lumbar three to Lumbar five Decompressive lumbar laminectomy;  Surgeon: Maeola Harman, MD;  Location: [facility] OR;  Service: Neurosurgery;  Laterality: N/A;   NASAL SINUS SURGERY     SHOULDER SURGERY Right 2007   repair   TONSILLECTOMY     TRIGGER FINGER RELEASE  03/19/2011   Procedure:  left middle finger RELEASE TRIGGER FINGER/A-1 PULLEY;  Surgeon: Wyn Forster., MD;  Location: [facility];  Service: Orthopedics;  Laterality: Right;  Procedure:  Release Right Long and Ring Trigger Fingers, Release Left Long Trigger Finger, Injection Left Long Proximal Phalangeal Joint   TRIGGER FINGER RELEASE Right    Ring finger, middle finger   UPPER GASTROINTESTINAL ENDOSCOPY     Social History   Social History Narrative   Patient is widowed.   Patient has two children.   Patient does not drink any caffeine.    Patient has a high school education.   Patient is right-handed.            Immunization History  Administered Date(s) Administered   Fluad Quad(high Dose 65+) 12/07/2021   Influenza Split 12/01/2010, 11/12/2011, 12/13/2011, 12/12/2012, 12/24/2013, 11/30/2019   Influenza, High Dose Seasonal PF 12/07/2015, 12/19/2016, 11/30/2017   Influenza, Quadrivalent, Recombinant, Inj, Pf 10/31/2018    Influenza,inj,Quad PF,6+ Mos 12/24/2013, 11/07/2014   PFIZER Comirnaty(Gray Top)Covid-19 Tri-Sucrose Vaccine 08/23/2020   PFIZER(Purple Top)SARS-COV-2 Vaccination 03/24/2019, 04/13/2019, 11/29/2019   Pneumococcal Conjugate-13 07/21/2016   Pneumococcal Polysaccharide-23 01/01/2009, 01/12/2013   Td,absorbed, Preservative Free, Adult Use, Lf Unspecified 10/18/2011   Tdap 06/05/2013   Zoster Recombinant(Shingrix) 08/01/2017, 10/10/2017   Zoster, Live 07/22/2010, 08/31/2010, 10/10/2017     Objective: Vital Signs: BP 136/73 (BP Location: Left Arm, Patient Position: Standing, Cuff Size: Normal)   Pulse 83   Resp 14   Ht 5\' 4"  (1.626 m)   Wt 118 lb (53.5 kg)   BMI 20.25 kg/m    Physical Exam Vitals and nursing note reviewed.  Constitutional:      Appearance: She is well-developed.  HENT:     Head: Normocephalic and atraumatic.  Eyes:     Conjunctiva/sclera: Conjunctivae normal.  Cardiovascular:     Rate and Rhythm: Normal rate and regular rhythm.     Heart sounds: Normal heart sounds.  Pulmonary:     Effort: Pulmonary effort is normal.     Breath sounds: Normal breath sounds.  Abdominal:     General: Bowel sounds are normal.  Palpations: Abdomen is soft.  Musculoskeletal:     Cervical back: Normal range of motion.  Lymphadenopathy:     Cervical: No cervical adenopathy.  Skin:    General: Skin is warm and dry.     Capillary Refill: Capillary refill takes less than 2 seconds.  Neurological:     Mental Status: She is alert and oriented to person, place, and time.  Psychiatric:        Behavior: Behavior normal.      Musculoskeletal Exam: Cervical spine was in good range of motion.  She had no tenderness over thoracic or lumbar spine.  Shoulder joints, elbow joints, wrist joints were in good range of motion.  She had bilateral PIP and DIP thickening with no synovitis.  Hip joints and knee joints in good range of motion without any warmth swelling or effusion.  There was no  tenderness over ankles or MTPs.  CDAI Exam: CDAI Score: -- Patient Global: --; Provider Global: -- Swollen: --; Tender: -- Joint Exam 04/13/2023   No joint exam has been documented for this visit   There is currently no information documented on the homunculus. Go to the Rheumatology activity and complete the homunculus joint exam.  Investigation: No additional findings.  Imaging: No results found.  Recent Labs: Lab Results  Component Value Date   WBC 5.2 11/09/2022   HGB 10.3 (L) 11/09/2022   PLT 205 11/09/2022   NA 141 11/09/2022   K 4.5 11/09/2022   CL 103 11/09/2022   CO2 28 11/09/2022   GLUCOSE 92 11/09/2022   BUN 25 11/09/2022   CREATININE 1.15 (H) 11/09/2022   BILITOT 1.0 11/09/2022   ALKPHOS 53 12/29/2021   AST 22 11/09/2022   ALT 14 11/09/2022   PROT 7.2 11/09/2022   ALBUMIN 4.5 12/29/2021   CALCIUM 9.9 11/09/2022   GFRAA 48 (L) 08/20/2020    Speciality Comments: PLQ eye exam: 12/01/2022 normal. [facility] Follow up in 1 year. Prolia October 2020   Procedures:  No procedures performed Allergies: Patient has no known allergies.   Assessment / Plan:     Visit Diagnoses: Age-related osteoporosis without current pathological fracture - bone density was on October 10, 2018.  Left femoral T score was -2.3, BMD 0.591.  She has been on Prolia since October 2020.  Before that she was on Fosamax.  She was initially getting Prolia through Dr. Rinaldo Cloud office.  I will schedule repeat DEXA scan.  Need for taking calcium and vitamin D on a regular basis was emphasized.   Vitamin D deficiency - November 09, 2022 vitamin D 81.  Will check vitamin D level.  PMR (polymyalgia rheumatica) (HCC)-patient has no increased muscular weakness or tenderness on the examination.  She has no difficulty getting up from the chair.  High risk medication use - Plaquenil 200 mg p.o. every other day.  Plaquenil eye exam December 01, 2022.  November 09, 2022  hemoglobin 10.3, creatinine 1.15. - Plan: CBC with Differential/Platelet, COMPLETE METABOLIC PANEL WITH GFR today.  Will check labs every 5 months.  Annual eye examination was advised.  Information immunization was placed in the AVS.   History of repair of right rotator cuff-she had good range of motion today.  Chronic left shoulder pain-no discomfort on the examination today.  Primary osteoarthritis of both hands-she also PIP and DIP thickening with no synovitis.  Primary osteoarthritis of both feet-she denies any discomfort today.  DDD (degenerative disc disease), cervical-she has limitation with  lateral rotation without discomfort.  Degeneration of intervertebral disc of lumbar region without discogenic back pain or lower extremity pain-she intermittent lower back pain.  Chronic kidney disease, unspecified CKD stage - Followed by Dr. Malen Gauze.  Creatinine was 1.15 on November 09, 2022.  History of hypertension-blood pressure was 136/73 today.  History of gastroesophageal reflux (GERD)  Night terrors, adult-followed by Dr. Vickey Huger.  Sleep walking  Anxiety and depression-patient is upset about not being able to drive.  She will be discussing this further with her family members.  Gait instability  Other headache syndrome  Orders: Orders Placed This Encounter  Procedures   DG Bone Density   CBC with Differential/Platelet   COMPLETE METABOLIC PANEL WITH GFR   No orders of the defined types were placed in this encounter.    Follow-Up Instructions: Return in about 5 months (around 09/10/2023) for PMR, OA.   Pollyann Savoy, MD  Note - This record has been created using Animal nutritionist.  Chart creation errors have been sought, but may not always  have been located. Such creation errors do not reflect on  the standard of medical care.

## 2023-04-01 DIAGNOSIS — M6281 Muscle weakness (generalized): Secondary | ICD-10-CM | POA: Diagnosis not present

## 2023-04-11 ENCOUNTER — Telehealth: Payer: Self-pay | Admitting: Neurology

## 2023-04-11 NOTE — Telephone Encounter (Signed)
 Pt request refill for clonazePAM  (KLONOPIN ) 0.5 MG tablet send to [facility]. Pt said please make sure refill is for 30 tablets. Have a cost of $11 where normally there is no cost.

## 2023-04-13 ENCOUNTER — Encounter: Payer: Self-pay | Admitting: Rheumatology

## 2023-04-13 ENCOUNTER — Telehealth: Payer: Self-pay

## 2023-04-13 ENCOUNTER — Other Ambulatory Visit: Payer: Self-pay

## 2023-04-13 ENCOUNTER — Telehealth: Payer: Self-pay | Admitting: Rheumatology

## 2023-04-13 ENCOUNTER — Ambulatory Visit: Payer: Medicare HMO | Attending: Rheumatology | Admitting: Rheumatology

## 2023-04-13 VITALS — BP 136/73 | HR 83 | Resp 14 | Ht 64.0 in | Wt 118.0 lb

## 2023-04-13 DIAGNOSIS — M25512 Pain in left shoulder: Secondary | ICD-10-CM

## 2023-04-13 DIAGNOSIS — M19072 Primary osteoarthritis, left ankle and foot: Secondary | ICD-10-CM

## 2023-04-13 DIAGNOSIS — F419 Anxiety disorder, unspecified: Secondary | ICD-10-CM

## 2023-04-13 DIAGNOSIS — M19071 Primary osteoarthritis, right ankle and foot: Secondary | ICD-10-CM | POA: Diagnosis not present

## 2023-04-13 DIAGNOSIS — E559 Vitamin D deficiency, unspecified: Secondary | ICD-10-CM | POA: Diagnosis not present

## 2023-04-13 DIAGNOSIS — M5030 Other cervical disc degeneration, unspecified cervical region: Secondary | ICD-10-CM

## 2023-04-13 DIAGNOSIS — G8929 Other chronic pain: Secondary | ICD-10-CM

## 2023-04-13 DIAGNOSIS — Z79899 Other long term (current) drug therapy: Secondary | ICD-10-CM

## 2023-04-13 DIAGNOSIS — M353 Polymyalgia rheumatica: Secondary | ICD-10-CM

## 2023-04-13 DIAGNOSIS — Z9889 Other specified postprocedural states: Secondary | ICD-10-CM

## 2023-04-13 DIAGNOSIS — M19041 Primary osteoarthritis, right hand: Secondary | ICD-10-CM

## 2023-04-13 DIAGNOSIS — R2681 Unsteadiness on feet: Secondary | ICD-10-CM

## 2023-04-13 DIAGNOSIS — F32A Depression, unspecified: Secondary | ICD-10-CM

## 2023-04-13 DIAGNOSIS — F513 Sleepwalking [somnambulism]: Secondary | ICD-10-CM

## 2023-04-13 DIAGNOSIS — M810 Age-related osteoporosis without current pathological fracture: Secondary | ICD-10-CM | POA: Diagnosis not present

## 2023-04-13 DIAGNOSIS — G4489 Other headache syndrome: Secondary | ICD-10-CM

## 2023-04-13 DIAGNOSIS — M51369 Other intervertebral disc degeneration, lumbar region without mention of lumbar back pain or lower extremity pain: Secondary | ICD-10-CM

## 2023-04-13 DIAGNOSIS — N189 Chronic kidney disease, unspecified: Secondary | ICD-10-CM

## 2023-04-13 DIAGNOSIS — F514 Sleep terrors [night terrors]: Secondary | ICD-10-CM

## 2023-04-13 DIAGNOSIS — Z8719 Personal history of other diseases of the digestive system: Secondary | ICD-10-CM

## 2023-04-13 DIAGNOSIS — M19042 Primary osteoarthritis, left hand: Secondary | ICD-10-CM

## 2023-04-13 DIAGNOSIS — Z8679 Personal history of other diseases of the circulatory system: Secondary | ICD-10-CM

## 2023-04-13 NOTE — Telephone Encounter (Signed)
Patient came in for her appointment today and requested a receipt for the $95 payment she gave to [facility] for her Prolia injection in September.  I found a note from 11/03/22 - Rx has been processed in [facility] and there is a copay of $95.00. Payment information has been collected and forwarded to the pharmacy.  Do you know how she can get a receipt for that payment?

## 2023-04-13 NOTE — Telephone Encounter (Signed)
Please add vitamin D to labs when they result tomorrow, per Dr. Corliss Skains. (She was advised this per Lupita Leash)

## 2023-04-13 NOTE — Patient Instructions (Signed)

## 2023-04-13 NOTE — Telephone Encounter (Signed)
I've asked Esperanza Richters to look into this. Will await response and let patient know once

## 2023-04-14 DIAGNOSIS — R9230 Dense breasts, unspecified: Secondary | ICD-10-CM | POA: Diagnosis not present

## 2023-04-14 DIAGNOSIS — I10 Essential (primary) hypertension: Secondary | ICD-10-CM | POA: Diagnosis not present

## 2023-04-14 DIAGNOSIS — R1900 Intra-abdominal and pelvic swelling, mass and lump, unspecified site: Secondary | ICD-10-CM | POA: Diagnosis not present

## 2023-04-14 DIAGNOSIS — R10813 Right lower quadrant abdominal tenderness: Secondary | ICD-10-CM | POA: Diagnosis not present

## 2023-04-14 LAB — CBC WITH DIFFERENTIAL/PLATELET
Absolute Lymphocytes: 1150 {cells}/uL (ref 850–3900)
Absolute Monocytes: 408 {cells}/uL (ref 200–950)
Basophils Absolute: 32 {cells}/uL (ref 0–200)
Basophils Relative: 0.6 %
Eosinophils Absolute: 90 {cells}/uL (ref 15–500)
Eosinophils Relative: 1.7 %
HCT: 31.6 % — ABNORMAL LOW (ref 35.0–45.0)
Hemoglobin: 10.9 g/dL — ABNORMAL LOW (ref 11.7–15.5)
MCH: 34.4 pg — ABNORMAL HIGH (ref 27.0–33.0)
MCHC: 34.5 g/dL (ref 32.0–36.0)
MCV: 99.7 fL (ref 80.0–100.0)
MPV: 10.4 fL (ref 7.5–12.5)
Monocytes Relative: 7.7 %
Neutro Abs: 3620 {cells}/uL (ref 1500–7800)
Neutrophils Relative %: 68.3 %
Platelets: 181 10*3/uL (ref 140–400)
RBC: 3.17 10*6/uL — ABNORMAL LOW (ref 3.80–5.10)
RDW: 12 % (ref 11.0–15.0)
Total Lymphocyte: 21.7 %
WBC: 5.3 10*3/uL (ref 3.8–10.8)

## 2023-04-14 LAB — COMPLETE METABOLIC PANEL WITH GFR
AG Ratio: 1.9 (calc) (ref 1.0–2.5)
ALT: 16 U/L (ref 6–29)
AST: 26 U/L (ref 10–35)
Albumin: 4.5 g/dL (ref 3.6–5.1)
Alkaline phosphatase (APISO): 40 U/L (ref 37–153)
BUN/Creatinine Ratio: 20 (calc) (ref 6–22)
BUN: 25 mg/dL (ref 7–25)
CO2: 31 mmol/L (ref 20–32)
Calcium: 10 mg/dL (ref 8.6–10.4)
Chloride: 104 mmol/L (ref 98–110)
Creat: 1.22 mg/dL — ABNORMAL HIGH (ref 0.60–0.95)
Globulin: 2.4 g/dL (ref 1.9–3.7)
Glucose, Bld: 157 mg/dL — ABNORMAL HIGH (ref 65–99)
Potassium: 4.3 mmol/L (ref 3.5–5.3)
Sodium: 143 mmol/L (ref 135–146)
Total Bilirubin: 0.7 mg/dL (ref 0.2–1.2)
Total Protein: 6.9 g/dL (ref 6.1–8.1)
eGFR: 43 mL/min/{1.73_m2} — ABNORMAL LOW (ref >=60)

## 2023-04-14 LAB — VITAMIN D 25 HYDROXY (VIT D DEFICIENCY, FRACTURES): Vit D, 25-Hydroxy: 58 ng/mL (ref 30–100)

## 2023-04-14 LAB — TEST AUTHORIZATION

## 2023-04-14 NOTE — Progress Notes (Signed)
Vitamin D is normal

## 2023-04-14 NOTE — Telephone Encounter (Signed)
Lab added per Cindee Lame.

## 2023-04-14 NOTE — Progress Notes (Signed)
Hemoglobin is low and stable.  Glucose is elevated at 157.  Creatinine remains elevated and stable.  Please forward results to her PCP.

## 2023-04-14 NOTE — Telephone Encounter (Signed)
Received message from [facility] via Teams. Prolia receipt sent to patient home today. NFN  Chesley Mires, PharmD, MPH, BCPS, CPP Clinical Pharmacist (Rheumatology and Pulmonology)

## 2023-04-15 ENCOUNTER — Other Ambulatory Visit (HOSPITAL_COMMUNITY): Payer: Self-pay | Admitting: Adult Health

## 2023-04-15 DIAGNOSIS — R1900 Intra-abdominal and pelvic swelling, mass and lump, unspecified site: Secondary | ICD-10-CM

## 2023-04-15 DIAGNOSIS — R10813 Right lower quadrant abdominal tenderness: Secondary | ICD-10-CM

## 2023-04-20 ENCOUNTER — Other Ambulatory Visit (HOSPITAL_COMMUNITY): Payer: Self-pay | Admitting: Adult Health

## 2023-04-20 ENCOUNTER — Ambulatory Visit (HOSPITAL_COMMUNITY)
Admission: RE | Admit: 2023-04-20 | Discharge: 2023-04-20 | Disposition: A | Discharge status: Home / Self Care | Payer: Medicare HMO | Source: Ambulatory Visit | Attending: Adult Health | Admitting: Adult Health

## 2023-04-20 DIAGNOSIS — R10813 Right lower quadrant abdominal tenderness: Secondary | ICD-10-CM

## 2023-04-20 DIAGNOSIS — R1900 Intra-abdominal and pelvic swelling, mass and lump, unspecified site: Secondary | ICD-10-CM

## 2023-04-22 DIAGNOSIS — M6281 Muscle weakness (generalized): Secondary | ICD-10-CM | POA: Diagnosis not present

## 2023-04-27 ENCOUNTER — Telehealth: Payer: Self-pay | Admitting: *Deleted

## 2023-04-27 NOTE — Telephone Encounter (Signed)
 Yes, it is okay for her to do DEXA scan somewhere else unless she wants to wait until May and have it done at Dr. Rinaldo Cloud office

## 2023-04-27 NOTE — Telephone Encounter (Signed)
 I called patient, patient advised to call [facility] or have Dr. Evlyn Kanner order DEXA at [facility], patient will advise where she wants to go.

## 2023-04-27 NOTE — Telephone Encounter (Signed)
 Patient contacted the office stating she advised Dr. Evlyn Kanner that you were requesting she have an update bone density scan. Patient states that he advised her that his machine is broken. Patient states Dr. Evlyn Kanner hopes to have it replaced by May 2025. Patient states she would like to know if it is okay that she wait until then or should she have it done somewhere else?

## 2023-04-29 DIAGNOSIS — M6281 Muscle weakness (generalized): Secondary | ICD-10-CM | POA: Diagnosis not present

## 2023-05-02 ENCOUNTER — Ambulatory Visit: Payer: Medicare HMO | Admitting: Neurology

## 2023-05-06 DIAGNOSIS — M6281 Muscle weakness (generalized): Secondary | ICD-10-CM | POA: Diagnosis not present

## 2023-05-10 ENCOUNTER — Ambulatory Visit: Payer: Medicare HMO | Admitting: Neurology

## 2023-05-13 DIAGNOSIS — M6281 Muscle weakness (generalized): Secondary | ICD-10-CM | POA: Diagnosis not present

## 2023-05-17 ENCOUNTER — Other Ambulatory Visit: Payer: Self-pay | Admitting: Rheumatology

## 2023-05-17 NOTE — Telephone Encounter (Signed)
 Last Fill: 11/17/2022  Eye exam: 12/01/2022   Labs: 04/13/2023 Hemoglobin is low and stable.  Glucose is elevated at 157.  Creatinine remains elevated and stable.  Please forward results to her PCP. Vitamin D is normal.   Next Visit: 09/29/2023  Last Visit: 04/13/2023  DX:PMR (polymyalgia rheumatica)   Current Dose per office note 04/13/2023: Plaquenil 200 mg p.o. every other day   Okay to refill Plaquenil?

## 2023-05-20 DIAGNOSIS — M6281 Muscle weakness (generalized): Secondary | ICD-10-CM | POA: Diagnosis not present

## 2023-05-26 DIAGNOSIS — M6281 Muscle weakness (generalized): Secondary | ICD-10-CM | POA: Diagnosis not present

## 2023-06-08 ENCOUNTER — Other Ambulatory Visit (HOSPITAL_COMMUNITY): Payer: Self-pay

## 2023-06-08 ENCOUNTER — Telehealth: Payer: Self-pay | Admitting: Pharmacist

## 2023-06-08 ENCOUNTER — Other Ambulatory Visit: Payer: Self-pay | Admitting: Pharmacist

## 2023-06-08 ENCOUNTER — Telehealth: Payer: Self-pay | Admitting: Pharmacy Technician

## 2023-06-08 NOTE — Progress Notes (Signed)
 Next Prolia SQ due on 05/10/23. Diagnosis: age-related osteoporosis  Dose: 60 mg SQ every 6 months  Last Clinic Visit: 04/13/23 Next Clinic Visit: 09/29/23  Last Prolia dose: 11/11/22  Labs: 04/13/23  Orders placed for Prolia x 1 dose. No premedicatons required.   Order placed for [facility]  Chesley Mires, PharmD, MPH, BCPS, CPP Clinical Pharmacist (Rheumatology and Pulmonology)

## 2023-06-08 NOTE — Telephone Encounter (Signed)
 Auth Submission: APPROVED Site of care: Site of care: [facility] Payer: [employer] MEDICARE Medication & CPT/J Code(s) submitted: Prolia (Denosumab) [id number] Route of submission (phone, fax, portal): PORTAL Phone # Fax # Auth type: Buy/Bill PB Units/visits requested: 60MG  Q6 MONTHS 2 DOSES Reference number: [id number]  Approval from: 06/08/23 to 02/29/24

## 2023-06-08 NOTE — Telephone Encounter (Signed)
 Patient due for Prolia on 05/10/2023. Per test claim, copay is $918.08. Will be more cost effective for patient to go to [facility]  D/w patient and she is in agreement. Referral placed to [facility]  Chesley Mires, PharmD, MPH, BCPS, CPP Clinical Pharmacist (Rheumatology and Pulmonology)

## 2023-06-14 ENCOUNTER — Other Ambulatory Visit: Payer: Self-pay | Admitting: Pharmacist

## 2023-06-14 NOTE — Progress Notes (Signed)
 Disenrolling from CR. She will complete Prolia at infusion center due to cost  Geraldene Kleine, PharmD, MPH, BCPS, CPP Clinical Pharmacist (Rheumatology and Pulmonology)

## 2023-06-20 DIAGNOSIS — M6281 Muscle weakness (generalized): Secondary | ICD-10-CM | POA: Diagnosis not present

## 2023-06-22 ENCOUNTER — Telehealth: Payer: Self-pay | Admitting: Rheumatology

## 2023-06-22 NOTE — Telephone Encounter (Signed)
 Pt is requesting to speak with the pharmacist about her prolia  shot. Pt stated her insurance  called her stating it would $950 to get her medication. Pt is confused about this and would like to know what to do. Pts call back number is [phone]

## 2023-06-24 DIAGNOSIS — M6281 Muscle weakness (generalized): Secondary | ICD-10-CM | POA: Diagnosis not present

## 2023-06-27 NOTE — Telephone Encounter (Signed)
 Scheduled pt on 07/21/23 at 11:20am. Pt is also on the wait list.

## 2023-06-27 NOTE — Telephone Encounter (Signed)
 Received fax from [facility] stating tier lowering exception is denied for Prolia . Spoke with patient - she states she does not believe it is fair to receive Prolia  at infusion center without knowing cost. Understand concern - she states she'd like to possibly discuss other treatment options  I did emphasize risk for atypical fractures with delay in Prolia  treatment past 2-3 months  Will see if any openings on Dr. Jory Ng schedule  Gloria Anderson, PharmD, MPH, BCPS, CPP Clinical Pharmacist (Rheumatology and Pulmonology)

## 2023-06-28 DIAGNOSIS — M6281 Muscle weakness (generalized): Secondary | ICD-10-CM | POA: Diagnosis not present

## 2023-07-01 DIAGNOSIS — M6281 Muscle weakness (generalized): Secondary | ICD-10-CM | POA: Diagnosis not present

## 2023-07-05 DIAGNOSIS — M6281 Muscle weakness (generalized): Secondary | ICD-10-CM | POA: Diagnosis not present

## 2023-07-06 ENCOUNTER — Other Ambulatory Visit (HOSPITAL_COMMUNITY): Payer: Self-pay

## 2023-07-06 ENCOUNTER — Telehealth: Payer: Self-pay | Admitting: Neurology

## 2023-07-06 DIAGNOSIS — M353 Polymyalgia rheumatica: Secondary | ICD-10-CM | POA: Diagnosis not present

## 2023-07-06 DIAGNOSIS — R7302 Impaired glucose tolerance (oral): Secondary | ICD-10-CM | POA: Diagnosis not present

## 2023-07-06 DIAGNOSIS — E039 Hypothyroidism, unspecified: Secondary | ICD-10-CM | POA: Diagnosis not present

## 2023-07-06 DIAGNOSIS — I951 Orthostatic hypotension: Secondary | ICD-10-CM | POA: Diagnosis not present

## 2023-07-06 DIAGNOSIS — I129 Hypertensive chronic kidney disease with stage 1 through stage 4 chronic kidney disease, or unspecified chronic kidney disease: Secondary | ICD-10-CM | POA: Diagnosis not present

## 2023-07-06 DIAGNOSIS — E785 Hyperlipidemia, unspecified: Secondary | ICD-10-CM | POA: Diagnosis not present

## 2023-07-06 DIAGNOSIS — M810 Age-related osteoporosis without current pathological fracture: Secondary | ICD-10-CM | POA: Diagnosis not present

## 2023-07-06 DIAGNOSIS — N1832 Chronic kidney disease, stage 3b: Secondary | ICD-10-CM | POA: Diagnosis not present

## 2023-07-06 NOTE — Telephone Encounter (Signed)
 LVM and sent mychart msg informing pt of need to reschedule 07/14/23 appt - MD in meeting  If patient calls back to r/s you can offer an 11:30am slot on a Tuesday, Wednesday, or Thursday per [facility]

## 2023-07-07 NOTE — Progress Notes (Signed)
 Office Visit Note  Patient: Gloria Anderson             Date of Birth: 09/19/1933           MRN: [id number]             PCP: Rosslyn Coons, MD Referring: Rosslyn Coons, MD Visit Date: 07/21/2023 Occupation: @GUAROCC @  Subjective:  Medication management   History of Present Illness: Gloria Anderson is a 89 y.o. female with osteoporosis, osteoarthritis and polymyalgia rheumatica.  She is concerned about the cost of Prolia  as it has gone up.  She states it will not be affordable for her.  She has been taking calcium  and vitamin D  on a regular basis and trying to exercise.  She has not had any flares of polymyalgia rheumatica.  She continues to have some stiffness in her neck and back but is tolerable.  She denies any joint pain or joint swelling.  She is some stiffness in her hands due to underlying osteoarthritis.  She has been taking Plaquenil  200 mg, half tablet daily Monday to Friday.    Activities of Daily Living:  Patient reports morning stiffness for 0 minute.   Patient Denies nocturnal pain.  Difficulty dressing/grooming: Denies Difficulty climbing stairs: Denies Difficulty getting out of chair: Denies Difficulty using hands for taps, buttons, cutlery, and/or writing: Reports  Review of Systems  Constitutional:  Positive for fatigue.  HENT:  Positive for mouth dryness. Negative for mouth sores.   Eyes:  Positive for dryness.  Respiratory:  Negative for shortness of breath.   Cardiovascular:  Negative for chest pain and palpitations.  Gastrointestinal:  Positive for constipation. Negative for blood in stool and diarrhea.  Endocrine: Negative for increased urination.  Genitourinary:  Positive for involuntary urination.  Musculoskeletal:  Positive for gait problem. Negative for joint pain, joint pain, joint swelling, myalgias, muscle weakness, morning stiffness, muscle tenderness and myalgias.  Skin:  Positive for hair loss and sensitivity to sunlight. Negative for  color change and rash.  Allergic/Immunologic: Negative for susceptible to infections.  Neurological:  Negative for dizziness and headaches.  Hematological:  Negative for swollen glands.  Psychiatric/Behavioral:  Positive for depressed mood. Negative for sleep disturbance. The patient is not nervous/anxious.     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HCC) 05/23/2019   Nocturnal seizures (HCC) 05/23/2019   Adult night terrors 01/09/2019   Degenerative lumbar spinal stenosis 08/15/2018   Memory difficulty 07/26/2017   Other parasomnia 07/26/2017   Anemia 06/02/2017   Osteopenia 06/02/2017   Spinal stenosis 06/02/2017   Iron deficiency anemia secondary to inadequate dietary iron intake 08/05/2016   Fatigue associated with anemia 08/05/2016   Primary osteoarthritis of both feet 02/04/2016   Supine hypertension 02/04/2016   PMR (polymyalgia rheumatica) (HCC) 02/03/2016   Osteoarthritis, hand 02/03/2016   Age-related osteoporosis without current pathological fracture 02/03/2016   DDD (degenerative disc disease), lumbar 02/03/2016   High risk medication use 02/03/2016   Parasomnia, organic 08/04/2015   Parasomnia due to medical condition 02/03/2015   Cerebral cavernous malformation type 1 12/04/2013  Night terrors, adult 09/26/2013   HEMORRHOIDS-EXTERNAL 09/19/2009   GERD 09/19/2009   CONSTIPATION 09/19/2009   DYSPHAGIA 09/19/2009   History of colonic polyps 09/19/2009    Past Medical History:  Diagnosis Date    Arthritis    Complication of anesthesia    Constipation    Depression    GERD (gastroesophageal reflux disease)    Hypertension    Hypothyroidism    Osteoporosis    Pneumonia 2018   Polymyalgia (HCC)    Polymyalgia rheumatica (HCC)    PONV (postoperative nausea and vomiting)     Family History  Problem Relation Age of Onset   Tuberculosis Mother 11   CVA Father    Stroke Father 71   Hypertension Sister 106   Lung cancer Brother 60   Hypertension Son    Past Surgical History:  Procedure Laterality Date   BASAL CELL CARCINOMA EXCISION  2022   on face   COLONOSCOPY W/ POLYPECTOMY     EYE SURGERY     both cataracts   LUMBAR LAMINECTOMY/DECOMPRESSION MICRODISCECTOMY N/A 08/15/2018   Procedure: Lumbar three to Lumbar five Decompressive lumbar laminectomy;  Surgeon: Manya Sells, MD;  Location: [facility] OR;  Service: Neurosurgery;  Laterality: N/A;   NASAL SINUS SURGERY     SHOULDER SURGERY Right 2007   repair   TEAR DUCT PROBING Bilateral    TONSILLECTOMY     TRIGGER FINGER RELEASE  03/19/2011   Procedure:  left middle finger RELEASE TRIGGER FINGER/A-1 PULLEY;  Surgeon: Amelie Baize., MD;  Location: [facility];  Service: Orthopedics;  Laterality: Right;  Procedure:  Release Right Long and Ring Trigger Fingers, Release Left Long Trigger Finger, Injection Left Long Proximal Phalangeal Joint   TRIGGER FINGER RELEASE Right    Ring finger, middle finger   UPPER GASTROINTESTINAL ENDOSCOPY     Social History   Social History Narrative   Patient is widowed.   Patient has two children.   Patient does not drink any caffeine.    Patient has a high school education.   Patient is right-handed.            Immunization History  Administered Date(s) Administered   Fluad Quad(high Dose 65+) 12/07/2021   Influenza Split 12/01/2010, 11/12/2011, 12/13/2011, 12/12/2012, 12/24/2013, 11/30/2019   Influenza, High Dose Seasonal PF 12/07/2015, 12/19/2016, 11/30/2017    Influenza, Quadrivalent, Recombinant, Inj, Pf 10/31/2018   Influenza,inj,Quad PF,6+ Mos 12/24/2013, 11/07/2014   PFIZER Comirnaty(Gray Top)Covid-19 Tri-Sucrose Vaccine 08/23/2020   PFIZER(Purple Top)SARS-COV-2 Vaccination 03/24/2019, 04/13/2019, 11/29/2019   Pneumococcal Conjugate-13 07/21/2016   Pneumococcal Polysaccharide-23 01/01/2009, 01/12/2013   Td,absorbed, Preservative Free, Adult Use, Lf Unspecified 10/18/2011   Tdap 06/05/2013   Zoster Recombinant(Shingrix) 08/01/2017, 10/10/2017   Zoster, Live 07/22/2010, 08/31/2010, 10/10/2017     Objective: Vital Signs: BP (!) 153/79 (BP Location: Left Arm, Patient Position: Standing, Cuff Size: Normal)   Pulse 88   Resp 14   Ht 5\' 4"  (1.626 m)   Wt 120 lb (54.4 kg)   BMI 20.60 kg/m    Physical Exam Vitals and nursing note reviewed.  Constitutional:      Appearance: She is well-developed.  HENT:     Head: Normocephalic and atraumatic.  Eyes:     Conjunctiva/sclera: Conjunctivae normal.  Cardiovascular:     Rate and Rhythm: Normal rate and regular rhythm.     Heart sounds: Normal heart sounds.  Pulmonary:     Effort: Pulmonary effort is normal.     Breath sounds: Normal  breath sounds.  Abdominal:     General: Bowel sounds are normal.     Palpations: Abdomen is soft.  Musculoskeletal:     Cervical back: Normal range of motion.  Lymphadenopathy:     Cervical: No cervical adenopathy.  Skin:    General: Skin is warm and dry.     Capillary Refill: Capillary refill takes less than 2 seconds.  Neurological:     Mental Status: She is alert and oriented to person, place, and time.  Psychiatric:        Behavior: Behavior normal.      Musculoskeletal Exam: Cervical spine was limited range of motion without discomfort.  There was no tenderness over thoracic or lumbar spine.  Shoulders, elbows, wrist joints, MCPs were in good range of motion.  She had limited extension of PIP and DIP joints and thickening of PIP and DIP joints due  to osteoarthritis.  Hip joints and knee joints in good range of motion.  She had bilateral bunions.  There was no tenderness over ankles or MTPs.  CDAI Exam: CDAI Score: -- Patient Global: --; Provider Global: -- Swollen: --; Tender: -- Joint Exam 07/21/2023   No joint exam has been documented for this visit   There is currently no information documented on the homunculus. Go to the Rheumatology activity and complete the homunculus joint exam.  Investigation: No additional findings.  Imaging: No results found.  Recent Labs: Lab Results  Component Value Date   WBC 5.3 04/13/2023   HGB 10.9 (L) 04/13/2023   PLT 181 04/13/2023   NA 143 04/13/2023   K 4.3 04/13/2023   CL 104 04/13/2023   CO2 31 04/13/2023   GLUCOSE 157 (H) 04/13/2023   BUN 25 04/13/2023   CREATININE 1.22 (H) 04/13/2023   BILITOT 0.7 04/13/2023   ALKPHOS 53 12/29/2021   AST 26 04/13/2023   ALT 16 04/13/2023   PROT 6.9 04/13/2023   ALBUMIN 4.5 12/29/2021   CALCIUM  10.0 04/13/2023   GFRAA 48 (L) 08/20/2020    Speciality Comments: PLQ eye exam: 12/01/2022 normal. [facility] Follow up in 1 year. Prolia  October 2020  Patient states she has her eye checked for the Plaquenil  on 07/20/2023 and we should be receiving a fax soon.  Procedures:  No procedures performed Allergies: Patient has no known allergies.   Assessment / Plan:     Visit Diagnoses: Age-related osteoporosis without current pathological fracture - bone density was on October 10, 2018.  Left femoral T score was -2.3, BMD 0.591.  She has been on Prolia  since October 2020.Before that she was on Fosamax .  Patient states she will not be able to afford Prolia  due to the increased cost.  I discussed the option of starting her on IV Reclast annually for next couple of years.  She will have to start Fosamax  in 6 months after the first Reclast dose and continue on Fosamax  until her next Reclast dose.  Future Reclast infusions  can be planned based on her DEXA scan.  Side effects of IV Reclast including increased risk of atypical femur fracture and osteonecrosis of the jaw were discussed.  Patient's GFR is 48.  We will continue to monitor labs as she gets Reclast.  Vitamin D  deficiency-Vitamin D  was normal at 58 on April 13, 2023.  PMR (polymyalgia rheumatica) (HCC)-she has no muscular weakness or tenderness.  High risk medication use - Plaquenil  100 mg p.o. daily Monday to Friday.Aaron Aas  PLQ eye exam: 12/01/2022.  Patient states she had eye examination on Jul 20, 2023.  Labs have been monitored every 6 months.  History of repair of right rotator cuff-she had good range of motion without discomfort.  Chronic left shoulder pain-she is doing better with less pain.  Primary osteoarthritis of both hands-she has severe PIP and DIP thickening with limited extension of PIP and DIP joints.  She denies any discomfort.  Primary osteoarthritis of both feet-she had bilateral pulm's without discomfort.  DDD (degenerative disc disease), cervical-she had limited cervical range of motion without discomfort.  Degeneration of intervertebral disc of lumbar region without discogenic back pain or lower extremity pain-she gives history of intermittent lower back pain.  Core strength exercises were discussed.  Chronic kidney disease, unspecified CKD stage - Followed by Dr. Yvonnie Heritage.  History of hypertension-blood pressure was elevated at 168/79.  Repeat blood pressure was 153/79.  She was advised to monitor blood pressure and close with follow-up with her PCP.  Other medical problems are listed as follows:  Night terrors, adult - followed by Dr. Albertina Hugger.  Sleep walking  History of gastroesophageal reflux (GERD)  Gait instability  Anxiety and depression  Other headache syndrome  Orders: Orders Placed This Encounter  Procedures   Comprehensive metabolic panel with GFR   CBC with Differential/Platelet   No orders of the  defined types were placed in this encounter.    Follow-Up Instructions: Return in about 6 months (around 01/21/2024) for Osteoporosis, Polymyalgia rheumatica, Osteoarthritis.   Nicholas Bari, MD  Note - This record has been created using Animal nutritionist.  Chart creation errors have been sought, but may not always  have been located. Such creation errors do not reflect on  the standard of medical care.

## 2023-07-08 ENCOUNTER — Telehealth: Payer: Self-pay | Admitting: Neurology

## 2023-07-08 NOTE — Telephone Encounter (Signed)
 Returned call to pt and stated that we are only able to do 30 days at a time and she voiced gratitude and understanding

## 2023-07-08 NOTE — Telephone Encounter (Signed)
 Pt has r/s her appointment, on wait list, she is asking if more than the current amount of her clonazePAM  (KLONOPIN ) 0.5 MG tablet can be called in to [id number], she'd like to go back to getting 90day, please call.

## 2023-07-12 DIAGNOSIS — M6281 Muscle weakness (generalized): Secondary | ICD-10-CM | POA: Diagnosis not present

## 2023-07-14 ENCOUNTER — Ambulatory Visit: Admitting: Neurology

## 2023-07-14 DIAGNOSIS — M6281 Muscle weakness (generalized): Secondary | ICD-10-CM | POA: Diagnosis not present

## 2023-07-14 DIAGNOSIS — M810 Age-related osteoporosis without current pathological fracture: Secondary | ICD-10-CM | POA: Diagnosis not present

## 2023-07-14 LAB — HM DEXA SCAN

## 2023-07-19 DIAGNOSIS — M6281 Muscle weakness (generalized): Secondary | ICD-10-CM | POA: Diagnosis not present

## 2023-07-20 DIAGNOSIS — H04123 Dry eye syndrome of bilateral lacrimal glands: Secondary | ICD-10-CM | POA: Diagnosis not present

## 2023-07-21 ENCOUNTER — Other Ambulatory Visit: Payer: Self-pay | Admitting: Pharmacist

## 2023-07-21 ENCOUNTER — Ambulatory Visit: Attending: Rheumatology | Admitting: Rheumatology

## 2023-07-21 ENCOUNTER — Encounter: Payer: Self-pay | Admitting: Rheumatology

## 2023-07-21 VITALS — BP 153/79 | HR 88 | Resp 14 | Ht 64.0 in | Wt 120.0 lb

## 2023-07-21 DIAGNOSIS — M19071 Primary osteoarthritis, right ankle and foot: Secondary | ICD-10-CM | POA: Diagnosis not present

## 2023-07-21 DIAGNOSIS — Z9889 Other specified postprocedural states: Secondary | ICD-10-CM

## 2023-07-21 DIAGNOSIS — F32A Depression, unspecified: Secondary | ICD-10-CM

## 2023-07-21 DIAGNOSIS — M19041 Primary osteoarthritis, right hand: Secondary | ICD-10-CM

## 2023-07-21 DIAGNOSIS — R2681 Unsteadiness on feet: Secondary | ICD-10-CM

## 2023-07-21 DIAGNOSIS — M353 Polymyalgia rheumatica: Secondary | ICD-10-CM | POA: Diagnosis not present

## 2023-07-21 DIAGNOSIS — N189 Chronic kidney disease, unspecified: Secondary | ICD-10-CM

## 2023-07-21 DIAGNOSIS — M5030 Other cervical disc degeneration, unspecified cervical region: Secondary | ICD-10-CM | POA: Diagnosis not present

## 2023-07-21 DIAGNOSIS — M25512 Pain in left shoulder: Secondary | ICD-10-CM

## 2023-07-21 DIAGNOSIS — M19072 Primary osteoarthritis, left ankle and foot: Secondary | ICD-10-CM

## 2023-07-21 DIAGNOSIS — E559 Vitamin D deficiency, unspecified: Secondary | ICD-10-CM | POA: Diagnosis not present

## 2023-07-21 DIAGNOSIS — M810 Age-related osteoporosis without current pathological fracture: Secondary | ICD-10-CM

## 2023-07-21 DIAGNOSIS — F514 Sleep terrors [night terrors]: Secondary | ICD-10-CM

## 2023-07-21 DIAGNOSIS — Z79899 Other long term (current) drug therapy: Secondary | ICD-10-CM | POA: Diagnosis not present

## 2023-07-21 DIAGNOSIS — Z8719 Personal history of other diseases of the digestive system: Secondary | ICD-10-CM

## 2023-07-21 DIAGNOSIS — M51369 Other intervertebral disc degeneration, lumbar region without mention of lumbar back pain or lower extremity pain: Secondary | ICD-10-CM

## 2023-07-21 DIAGNOSIS — F419 Anxiety disorder, unspecified: Secondary | ICD-10-CM

## 2023-07-21 DIAGNOSIS — F513 Sleepwalking [somnambulism]: Secondary | ICD-10-CM

## 2023-07-21 DIAGNOSIS — G8929 Other chronic pain: Secondary | ICD-10-CM

## 2023-07-21 DIAGNOSIS — M6281 Muscle weakness (generalized): Secondary | ICD-10-CM | POA: Diagnosis not present

## 2023-07-21 DIAGNOSIS — M19042 Primary osteoarthritis, left hand: Secondary | ICD-10-CM

## 2023-07-21 DIAGNOSIS — G4489 Other headache syndrome: Secondary | ICD-10-CM

## 2023-07-21 DIAGNOSIS — Z8679 Personal history of other diseases of the circulatory system: Secondary | ICD-10-CM

## 2023-07-21 NOTE — Patient Instructions (Addendum)
 We will place order for Reclast infusion at [facility]. Then you will start alendronate  weekly for 6 months. Then another Reclast in one year   Zoledronic Acid Injection (Bone Disorders) What is this medication? ZOLEDRONIC ACID (ZOE le dron ik AS id) prevents and treats osteoporosis. It may also be used to treat Paget's disease of the bone. It works by Interior and spatial designer stronger and less likely to break (fracture). It belongs to a group of medications called bisphosphonates. This medicine may be used for other purposes; ask your health care provider or pharmacist if you have questions. COMMON BRAND NAME(S): Reclast What should I tell my care team before I take this medication? They need to know if you have any of these conditions: Bleeding disorder Cancer Dental disease Kidney disease Low levels of calcium  in the blood Low red blood cell counts Lung or breathing disease, such as asthma Receiving steroids, such as dexamethasone  or prednisone An unusual or allergic reaction to zoledronic acid, other medications, foods, dyes, or preservatives Pregnant or trying to get pregnant Breast-feeding How should I use this medication? This medication is injected into a vein. It is given by your care team in a hospital or clinic setting. A special MedGuide will be given to you before each treatment. Be sure to read this information carefully each time. Talk to your care team about the use of this medication in children. Special care may be needed. Overdosage: If you think you have taken too much of this medicine contact a poison control center or emergency room at once. NOTE: This medicine is only for you. Do not share this medicine with others. What if I miss a dose? Keep appointments for follow-up doses. It is important not to miss your dose. Call your care team if you are unable to keep an appointment. What may interact with this medication? Certain antibiotics given by  injection Medications for pain and inflammation, such as ibuprofen, naproxen, NSAIDs Some diuretics, such as bumetanide, furosemide Teriparatide This list may not describe all possible interactions. Give your health care provider a list of all the medicines, herbs, non-prescription drugs, or dietary supplements you use. Also tell them if you smoke, drink alcohol , or use illegal drugs. Some items may interact with your medicine. What should I watch for while using this medication? Visit your care team for regular checks on your progress. It may be some time before you see the benefit from this medication. Some people who take this medication have severe bone, joint, or muscle pain. This medication may also increase your risk for jaw problems or a broken thigh bone. Tell your care team right away if you have severe pain in your jaw, bones, joints, or muscles. Tell your care team if you have any pain that does not go away or that gets worse. You should make sure you get enough calcium  and vitamin D  while you are taking this medication. Discuss the foods you eat and the vitamins you take with your care team. You may need bloodwork while taking this medication. Tell your dentist and dental surgeon that you are taking this medication. You should not have major dental surgery while on this medication. See your dentist to have a dental exam and fix any dental problems before starting this medication. Take good care of your teeth while on this medication. Make sure you see your dentist for regular follow-up appointments. What side effects may I notice from receiving this medication? Side effects that you  should report to your care team as soon as possible: Allergic reactions--skin rash, itching, hives, swelling of the face, lips, tongue, or throat Kidney injury--decrease in the amount of urine, swelling of the ankles, hands, or feet Low calcium  level--muscle pain or cramps, confusion, tingling, or numbness in  the hands or feet Osteonecrosis of the jaw--pain, swelling, or redness in the mouth, numbness of the jaw, poor healing after dental work, unusual discharge from the mouth, visible bones in the mouth Severe bone, joint, or muscle pain Side effects that usually do not require medical attention (report to your care team if they continue or are bothersome): Diarrhea Dizziness Headache Nausea Stomach pain Vomiting This list may not describe all possible side effects. Call your doctor for medical advice about side effects. You may report side effects to FDA at 1-800-FDA-1088. Where should I keep my medication? This medication is given in a hospital or clinic. It will not be stored at home. NOTE: This sheet is a summary. It may not cover all possible information. If you have questions about this medicine, talk to your doctor, pharmacist, or health care provider.  2024 Elsevier/Gold Standard (2021-04-03 00:00:00)

## 2023-07-21 NOTE — Progress Notes (Signed)
 Therapy plan placed for Reclast IV [phone]) for [facility] to start benefits investigation  Diagnosis: osteoporosis  Provider: Dr. Alvira Josephs  Dose: 5mg  IV every 12 months; plan to start alendronate  in 6 months for 6 months of treatment then Reclast in one year agian  Last Clinic Visit: 07/21/23 Next Clinic Visit: 6 months  Pertinent Labs: drawn today  Geraldene Kleine, PharmD, MPH, BCPS, CPP Clinical Pharmacist (Rheumatology and Pulmonology)

## 2023-07-21 NOTE — Progress Notes (Signed)
 Pharmacy Note  Subjective: Patient presents today to [facility] for follow up office visit.   Patient seen by pharmacist for counseling on Reclast therapy for osteoporosis. Prior osteoporosis treatment includes: Prolia .   Objective: T-score in 04/2018 (femoral left hip): -2.3   Lab Results  Component Value Date   VD25OH 58 04/13/2023   CMP     Component Value Date/Time   NA 143 04/13/2023 1316   NA 137 12/29/2021 1450   K 4.3 04/13/2023 1316   CL 104 04/13/2023 1316   CO2 31 04/13/2023 1316   GLUCOSE 157 (H) 04/13/2023 1316   BUN 25 04/13/2023 1316   BUN 21 12/29/2021 1450   CREATININE 1.22 (H) 04/13/2023 1316   CALCIUM  10.0 04/13/2023 1316   PROT 6.9 04/13/2023 1316   PROT 6.9 12/29/2021 1450   ALBUMIN 4.5 12/29/2021 1450   AST 26 04/13/2023 1316   ALT 16 04/13/2023 1316   ALKPHOS 53 12/29/2021 1450   BILITOT 0.7 04/13/2023 1316   BILITOT 0.6 12/29/2021 1450   GFRNONAA 30 (L) 05/16/2021 1817   GFRNONAA 41 (L) 08/20/2020 1544   GFRAA 48 (L) 08/20/2020 1544    Osteoporosis risk factors include:  -Age -Sex    Assessment and Plan:  Counseled patient that Reclast is an IV bisphosphonate that reduces bone turnover by inhibiting osteoclasts that chew up bone.  Counseled patient on purpose, proper use, and adverse effects of Reclast.  Reviewed adverse events of Reclast including risk of nausea & diarrhea, headache, and muscle & bone pain.  Reviewed rare adverse effect of osteonecrosis of the jaw and advised patient to alert her dentist that she is on Reclast prior to any major dental work.  Patient confirms she does not have any major dental work scheduled at this time.    Reviewed importance of taking calcium  and vitamin D  with bisphosphonate therapy. Recommended daily amount of calcium  is 1200mg  and vitamin D  [id number] units.  Advised calcium  is better obtained through diet vs supplement.  Counseled about risk of excess calcium  supplementation such a kidney  stones and increased risk of heart disease.  Assessed dietary intake of calcium .   Provided patient with medication education material and answered all questions.   Patient agrees to trial of Reclast IV infusion 5 mg at this time. Due to patient switching from Prolia  we will repeat bisphosphonate therapy in 6 months following initial Reclast infusion. Plan to initiate alendronate  therapy for 6 months then resume yearly Reclast IV infusions.  Reclast is covered 80% by Medicare Part B and the remaining 20% can be covered by a supplemental plan.  Unable to provide exact co-pay amount as it is billed through medical not pharmacy benefit.  Patient verbalized understanding.   Tolu Terease Marcotte, PharmD [facility] PGY-1

## 2023-07-22 ENCOUNTER — Ambulatory Visit: Payer: Self-pay | Admitting: Rheumatology

## 2023-07-22 LAB — COMPREHENSIVE METABOLIC PANEL WITH GFR
AG Ratio: 1.6 (calc) (ref 1.0–2.5)
ALT: 12 U/L (ref 6–29)
AST: 24 U/L (ref 10–35)
Albumin: 4.4 g/dL (ref 3.6–5.1)
Alkaline phosphatase (APISO): 38 U/L (ref 37–153)
BUN/Creatinine Ratio: 19 (calc) (ref 6–22)
BUN: 23 mg/dL (ref 7–25)
CO2: 30 mmol/L (ref 20–32)
Calcium: 10 mg/dL (ref 8.6–10.4)
Chloride: 105 mmol/L (ref 98–110)
Creat: 1.24 mg/dL — ABNORMAL HIGH (ref 0.60–0.95)
Globulin: 2.7 g/dL (ref 1.9–3.7)
Glucose, Bld: 92 mg/dL (ref 65–99)
Potassium: 4.8 mmol/L (ref 3.5–5.3)
Sodium: 142 mmol/L (ref 135–146)
Total Bilirubin: 0.9 mg/dL (ref 0.2–1.2)
Total Protein: 7.1 g/dL (ref 6.1–8.1)
eGFR: 42 mL/min/{1.73_m2} — ABNORMAL LOW (ref >=60)

## 2023-07-22 LAB — CBC WITH DIFFERENTIAL/PLATELET
Absolute Lymphocytes: 1045 {cells}/uL (ref 850–3900)
Absolute Monocytes: 340 {cells}/uL (ref 200–950)
Basophils Absolute: 20 {cells}/uL (ref 0–200)
Basophils Relative: 0.4 %
Eosinophils Absolute: 70 {cells}/uL (ref 15–500)
Eosinophils Relative: 1.4 %
HCT: 32.8 % — ABNORMAL LOW (ref 35.0–45.0)
Hemoglobin: 10.9 g/dL — ABNORMAL LOW (ref 11.7–15.5)
MCH: 33.3 pg — ABNORMAL HIGH (ref 27.0–33.0)
MCHC: 33.2 g/dL (ref 32.0–36.0)
MCV: 100.3 fL — ABNORMAL HIGH (ref 80.0–100.0)
MPV: 10.5 fL (ref 7.5–12.5)
Monocytes Relative: 6.8 %
Neutro Abs: 3525 {cells}/uL (ref 1500–7800)
Neutrophils Relative %: 70.5 %
Platelets: 165 10*3/uL (ref 140–400)
RBC: 3.27 10*6/uL — ABNORMAL LOW (ref 3.80–5.10)
RDW: 11.9 % (ref 11.0–15.0)
Total Lymphocyte: 20.9 %
WBC: 5 10*3/uL (ref 3.8–10.8)

## 2023-07-22 NOTE — Progress Notes (Signed)
 Hemoglobin is low and stable.  Patient should not take multivitamin with iron.  Creatinine is elevated and stable.  Please forward results to her PCP.

## 2023-07-22 NOTE — Progress Notes (Signed)
 Patient should take multivitamin with iron.

## 2023-07-26 DIAGNOSIS — M6281 Muscle weakness (generalized): Secondary | ICD-10-CM | POA: Diagnosis not present

## 2023-07-27 ENCOUNTER — Telehealth: Payer: Self-pay | Admitting: Rheumatology

## 2023-07-27 NOTE — Telephone Encounter (Signed)
 Contacted pt and discussed, she had apparently spoken with her insurance and was informed that they "don't even touch Reclast". I informed her that a referral had been placed with the team over at the infusion center to investigate coverage and that we would let her know when we had an update. Advised that there was nothing that she needed to do at this time, however she was provided with my name and direct office number if she needed to reach back out.

## 2023-07-27 NOTE — Telephone Encounter (Signed)
 Pt came in the office wanting to speak with someone about her prolia . Pt stated Dr. Alvira Josephs said one thing and the pharmacist is saying another and she's confused. Pt wanted to see if someone could help explain what is going on. Pt would like a call back [phone]

## 2023-07-28 DIAGNOSIS — M6281 Muscle weakness (generalized): Secondary | ICD-10-CM | POA: Diagnosis not present

## 2023-08-02 DIAGNOSIS — M6281 Muscle weakness (generalized): Secondary | ICD-10-CM | POA: Diagnosis not present

## 2023-08-04 DIAGNOSIS — M6281 Muscle weakness (generalized): Secondary | ICD-10-CM | POA: Diagnosis not present

## 2023-08-09 DIAGNOSIS — M6281 Muscle weakness (generalized): Secondary | ICD-10-CM | POA: Diagnosis not present

## 2023-08-10 ENCOUNTER — Other Ambulatory Visit: Payer: Self-pay | Admitting: Physician Assistant

## 2023-08-10 NOTE — Telephone Encounter (Signed)
 Last Fill: 05/17/2023  Eye exam: 12/01/2022 normal.   Labs: 07/21/2023 Hemoglobin is low and stable.  Patient should not take multivitamin with iron.  Creatinine is elevated and stable.   Next Visit: 01/31/2024  Last Visit: 5/22/23025  DX:Age-related osteoporosis without current pathological fracture   Current Dose per office note 07/21/2023: Plaquenil  100 mg p.o. daily Monday to Friday   Okay to refill Plaquenil ?

## 2023-08-11 DIAGNOSIS — M6281 Muscle weakness (generalized): Secondary | ICD-10-CM | POA: Diagnosis not present

## 2023-08-16 DIAGNOSIS — M6281 Muscle weakness (generalized): Secondary | ICD-10-CM | POA: Diagnosis not present

## 2023-08-17 DIAGNOSIS — N1832 Chronic kidney disease, stage 3b: Secondary | ICD-10-CM | POA: Diagnosis not present

## 2023-08-17 DIAGNOSIS — K219 Gastro-esophageal reflux disease without esophagitis: Secondary | ICD-10-CM | POA: Diagnosis not present

## 2023-08-17 DIAGNOSIS — I129 Hypertensive chronic kidney disease with stage 1 through stage 4 chronic kidney disease, or unspecified chronic kidney disease: Secondary | ICD-10-CM | POA: Diagnosis not present

## 2023-08-17 DIAGNOSIS — N281 Cyst of kidney, acquired: Secondary | ICD-10-CM | POA: Diagnosis not present

## 2023-08-17 DIAGNOSIS — M353 Polymyalgia rheumatica: Secondary | ICD-10-CM | POA: Diagnosis not present

## 2023-08-18 DIAGNOSIS — M6281 Muscle weakness (generalized): Secondary | ICD-10-CM | POA: Diagnosis not present

## 2023-08-18 LAB — LAB REPORT - SCANNED: EGFR: 35

## 2023-08-19 NOTE — Telephone Encounter (Signed)
 Patient spoke with infusion center and said she was waiting to hear back form Demaris Fillers about what financial assistance options he was able to find for Reclast. Advised that Reclast is generic and unfortunately there are no additional cost assistance options at this time. Reviewed again that we will never be able to provide quote for an infusion as it depends on many moving parts: has she met her deductible, has she met her OOP max for the year?  She was curious if Reclast is as strong as Prolia . Advised that Prolia  is more aggressive treatment but if unable to stay on treatment due to cost, we would ideally transition to Reclast to maintain bone growth. Reviewed again the risk for atypical fractures with interruption in Prolia  of > 3 months. She verbalized understanding  She verbalized understanding. She would like to discuss further with her sons. She wil notify office if she decides not to move forward with Reclast  Geraldene Kleine, PharmD, MPH, BCPS, CPP Clinical Pharmacist (Rheumatology and Pulmonology)

## 2023-08-30 ENCOUNTER — Other Ambulatory Visit: Payer: Self-pay | Admitting: Neurology

## 2023-08-31 DIAGNOSIS — H9203 Otalgia, bilateral: Secondary | ICD-10-CM | POA: Diagnosis not present

## 2023-08-31 DIAGNOSIS — H6123 Impacted cerumen, bilateral: Secondary | ICD-10-CM | POA: Diagnosis not present

## 2023-08-31 DIAGNOSIS — J309 Allergic rhinitis, unspecified: Secondary | ICD-10-CM | POA: Diagnosis not present

## 2023-08-31 DIAGNOSIS — J3489 Other specified disorders of nose and nasal sinuses: Secondary | ICD-10-CM | POA: Diagnosis not present

## 2023-08-31 DIAGNOSIS — H6993 Unspecified Eustachian tube disorder, bilateral: Secondary | ICD-10-CM | POA: Diagnosis not present

## 2023-08-31 DIAGNOSIS — H938X3 Other specified disorders of ear, bilateral: Secondary | ICD-10-CM | POA: Diagnosis not present

## 2023-09-05 NOTE — Telephone Encounter (Signed)
 Last seen 10/27/22 and next f/u 09/20/23. Last refilled 08/11/23 #15.

## 2023-09-12 DIAGNOSIS — M545 Low back pain, unspecified: Secondary | ICD-10-CM | POA: Diagnosis not present

## 2023-09-14 DIAGNOSIS — M5116 Intervertebral disc disorders with radiculopathy, lumbar region: Secondary | ICD-10-CM | POA: Diagnosis not present

## 2023-09-14 DIAGNOSIS — M19072 Primary osteoarthritis, left ankle and foot: Secondary | ICD-10-CM | POA: Diagnosis not present

## 2023-09-14 DIAGNOSIS — M19042 Primary osteoarthritis, left hand: Secondary | ICD-10-CM | POA: Diagnosis not present

## 2023-09-14 DIAGNOSIS — M19071 Primary osteoarthritis, right ankle and foot: Secondary | ICD-10-CM | POA: Diagnosis not present

## 2023-09-14 DIAGNOSIS — M5030 Other cervical disc degeneration, unspecified cervical region: Secondary | ICD-10-CM | POA: Diagnosis not present

## 2023-09-14 DIAGNOSIS — M419 Scoliosis, unspecified: Secondary | ICD-10-CM | POA: Diagnosis not present

## 2023-09-14 DIAGNOSIS — M940 Chondrocostal junction syndrome [Tietze]: Secondary | ICD-10-CM | POA: Diagnosis not present

## 2023-09-14 DIAGNOSIS — M8008XS Age-related osteoporosis with current pathological fracture, vertebra(e), sequela: Secondary | ICD-10-CM | POA: Diagnosis not present

## 2023-09-14 DIAGNOSIS — M19041 Primary osteoarthritis, right hand: Secondary | ICD-10-CM | POA: Diagnosis not present

## 2023-09-20 ENCOUNTER — Ambulatory Visit: Admitting: Neurology

## 2023-09-20 ENCOUNTER — Encounter: Payer: Self-pay | Admitting: Neurology

## 2023-09-20 VITALS — BP 140/88 | HR 84 | Ht 64.0 in | Wt 117.4 lb

## 2023-09-20 DIAGNOSIS — F514 Sleep terrors [night terrors]: Secondary | ICD-10-CM

## 2023-09-20 DIAGNOSIS — Q282 Arteriovenous malformation of cerebral vessels: Secondary | ICD-10-CM | POA: Diagnosis not present

## 2023-09-20 DIAGNOSIS — G3184 Mild cognitive impairment, so stated: Secondary | ICD-10-CM

## 2023-09-20 MED ORDER — CLONAZEPAM 0.5 MG PO TABS: 0.2500 mg | ORAL_TABLET | Freq: Every day | ORAL | 1 refills | Status: DC

## 2023-09-20 NOTE — Patient Instructions (Signed)
  89 y.o. year old female  here with:     1) MCI with a more visio-spatial, impairment - and some cogwheeling.  Lewy body ?  Her mental status seems OK- she is oriented.   She has given up driving, and this is a good decision.    2)  dx of AVM related night terrors versus vivid REM BD-\I will refill her klonopin  , no vivid dreams at night.    Pt is asking about additional clonazepam  because her mail order pharmacy is delayed in sending refill.      I would like to thank  Nichole Senior, Md [address] [place],  [place] [address] for allowing me to meet with this pleasant patient.      The patient's condition requires frequent monitoring and adjustments in the treatment plan, reflecting the ongoing complexity of care.  This provider is the continuing focal point for all needed services for this condition.

## 2023-09-20 NOTE — Progress Notes (Signed)
 Provider:  Dedra Gores, MD  Primary Care Physician:  Nichole Senior, MD [address] [place] [address]     Referring Provider: Nichole Senior, Md [address] [place],  [place] [address]          Chief Complaint according to patient   Patient presents with:                HISTORY OF PRESENT ILLNESS:  Gloria Anderson is a 89 y.o. female patient who is here for revisit 09/20/2023 for  AV Malformation with night terrors , also MCI .  Here for [facility] and refills of medication  Chief concern according to patient :  I have noticed that I remember more words in [place], while struggling to find them in [place]. She also lost her last remaining sibling in [place] last months.  She had been very sick and this was not unexpected .   Gloria Anderson is a 89 y.o. female patient who is here with her son today-  for revisit 09/01/2022 for  follow up on a fall. She had fallen out of bed unsure why, unsure how , this was 10 Pm and has had been asleep before, she called 911, EMS was evaluating her the site,  was told she  was clearly speaking and  able to walk, therefore and she was not seen at the hospital - she ended up driving herself to urgent care the next morning ! She was seen at [facility] by a NP and ordered an X ray which identified a compression fracture of the thoracic spine.    She was not referred for kyphoplasty after a discussion with local MD.  Dr Nichole saw her last week, and she was not referred for special treatment, she has PT. She is clearly speaking here, well articulated and walked quickly.    Sheis not driving now. She uses a cane.  Her very first time , she had accidentally doubled up on Klonopin , her dose is 0.25 mg, she tool 0.5 mg.   We are discussing the dose reduction today, 0.25 mg . I like to further reduce this dose.      05-10-2022:  We have originally followed [name] for her parasomnia disorder which sometimes had quite  vivid expression.  She felt the presence of movement in the bedroom, she had dysesthesias of the scalp complained about a year ago and she was concerned about cognitive decline.  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  Her sleep diagnosis is adult night terrors.  She was asked today to perform again a cognitive test and a Mini-Mental status examination was chosen.  She scored 28 out of 30 points. She has had lightheadedness spells. No falls since 10-2021. Dr Ladona sees her as well.   The patient is using Klonopin  at bedtime to suppress parasomnia, she has to continue taking Plaquenil  but the dose was reduced from 200 to 100 mg. This treats her polymyalgia rheumatica.  She is continue to use Synthroid .  She uses Prilosec Lipitor and Coreg .    Blood pressure here today was also taken orthostatically in a seated position her blood pressure was 133/69 with a heart rate regular at 81 bpm. In a standing position her blood pressure was 103/56 with a heart rate of 88 regular. In supine resting position her blood pressure was 157/71 with a heart rate of 83.  What this tells me is  that her heart rate does not adjust to the blood pressure.  And that her standing blood pressure is severely orthostatic.  Review of Systems: Out of a complete 14 system review, the patient complains of only the following symptoms, and all other reviewed systems are negative.:   MCI     09/20/2023    4:17 PM 10/27/2022    3:54 PM 06/17/2021    3:11 PM 08/05/2020    1:06 PM 07/09/2019    3:14 PM  Montreal Cognitive Assessment Blind  Attention: Read list of digits (0/2) 2 0 1 2 2   Attention: Read list of letters (0/1) 0 1 1 1 1   Attention: Serial 7 subtraction starting at 100 (0/3) 3 3 0 3 3  Language: Repeat phrase (0/2) 0 0 2 0 2  Language : Fluency (0/1) 0 0 0 1 0  Abstraction (0/2) 2 2 2 2 2   Delayed Recall (0/5) 4 4 5 5 5   Orientation (0/6) 6 6 5 6 6   Total 17 16 16 20 21   Adjusted  Score (based on education)    20          Social History   Socioeconomic History   Marital status: Widowed    Spouse name: Not on file   Number of children: 2   Years of education: HS   Highest education level: Not on file  Occupational History   Occupation: RETIRED    Employer: [employer]  Tobacco Use   Smoking status: Never    Passive exposure: Never   Smokeless tobacco: Never  Vaping Use   Vaping status: Never Used  Substance and Sexual Activity   Alcohol  use: No    Alcohol /week: 0.0 standard drinks of alcohol    Drug use: Never   Sexual activity: Not on file  Other Topics Concern   Not on file  Social History Narrative   Patient is widowed.   Patient has two children.   Patient does not drink any caffeine.    Patient has a high school education.   Patient is right-handed.            Social Drivers of Corporate investment banker Strain: Not on file  Food Insecurity: Not on file  Transportation Needs: Not on file  Physical Activity: Not on file  Stress: Not on file  Social Connections: Not on file    Family History  Problem Relation Age of Onset   Tuberculosis Mother 61   CVA Father    Stroke Father 54   Hypertension Sister 43   Lung cancer Brother 34   Hypertension Son     Past Medical History:  Diagnosis Date   Arthritis    Complication of anesthesia    Constipation    Depression    GERD (gastroesophageal reflux disease)    Hypertension    Hypothyroidism    Osteoporosis    Pneumonia 2018   Polymyalgia (HCC)    Polymyalgia rheumatica (HCC)    PONV (postoperative nausea and vomiting)     Past Surgical History:  Procedure Laterality Date   BASAL CELL CARCINOMA EXCISION  2022   on face   COLONOSCOPY W/ POLYPECTOMY     EYE SURGERY     both cataracts   LUMBAR LAMINECTOMY/DECOMPRESSION MICRODISCECTOMY N/A 08/15/2018   Procedure: Lumbar three to Lumbar five Decompressive lumbar laminectomy;  Surgeon: Unice Pac, MD;  Location: [facility]  OR;  Service: Neurosurgery;  Laterality: N/A;   NASAL SINUS SURGERY  SHOULDER SURGERY Right 2007   repair   TEAR DUCT PROBING Bilateral    TONSILLECTOMY     TRIGGER FINGER RELEASE  03/19/2011   Procedure:  left middle finger RELEASE TRIGGER FINGER/A-1 PULLEY;  Surgeon: Lamar LULLA Leonor Mickey., MD;  Location: [facility];  Service: Orthopedics;  Laterality: Right;  Procedure:  Release Right Long and Ring Trigger Fingers, Release Left Long Trigger Finger, Injection Left Long Proximal Phalangeal Joint   TRIGGER FINGER RELEASE Right    Ring finger, middle finger   UPPER GASTROINTESTINAL ENDOSCOPY       Current Outpatient Medications on File Prior to Visit  Medication Sig Dispense Refill   acetaminophen  (TYLENOL ) 325 MG tablet Take 325 mg by mouth every 6 (six) hours as needed for moderate pain or headache.     atorvastatin  (LIPITOR) 10 MG tablet Take 10 mg by mouth every evening.     BIOTIN  FORTE PO Take 1 capsule by mouth daily.     Calcium  Citrate (CITRACAL PO) Take 2 tablets by mouth in the morning and at bedtime.     carvedilol  (COREG ) 3.125 MG tablet TAKE 1 TABLET TWICE DAILY AS NEEDED FOR STANDING BLOOD PRESSURE GREATER THAN 150 MMHG 180 tablet 3   clonazePAM  (KLONOPIN ) 0.5 MG tablet TAKE 1/2 TABLET AT BEDTIME 15 tablet 1   denosumab  (PROLIA ) 60 MG/ML SOSY injection Inject 60 mg into the skin every 6 (six) months. Courier to rheum: [address], [place] [address]. Appt on 11/09/2022 1 mL 0   hydrALAZINE  (APRESOLINE ) 25 MG tablet TAKE 1 TABLET (25 MG TOTAL) BY MOUTH 3 (THREE) TIMES DAILY AS NEEDED (STANDING BP >140 MM HG). 270 tablet 2   hydroxychloroquine  (PLAQUENIL ) 200 MG tablet TAKE 1 TABLET EVERY OTHER DAY 45 tablet 0   levothyroxine  (SYNTHROID , LEVOTHROID) 50 MCG tablet Take 50 mcg by mouth daily before breakfast.      Multiple Vitamin (MULTIVITAMIN PO) Take 1 tablet by mouth daily.     omeprazole (PRILOSEC) 20 MG capsule Take 20 mg by mouth daily.      OVER THE COUNTER MEDICATION Take 2 tablets by mouth in the morning and at bedtime. Calcium  mini     polyethylene glycol (MIRALAX  / GLYCOLAX ) packet Take 17 g by mouth daily as needed for moderate constipation.      Propylene Glycol (SYSTANE BALANCE OP) Apply 1 drop to eye as needed.     No current facility-administered medications on file prior to visit.    No Known Allergies   DIAGNOSTIC DATA (LABS, IMAGING, TESTING) - I reviewed patient records, labs, notes, testing and imaging myself where available.  Lab Results  Component Value Date   WBC 5.0 07/21/2023   HGB 10.9 (L) 07/21/2023   HCT 32.8 (L) 07/21/2023   MCV 100.3 (H) 07/21/2023   PLT 165 07/21/2023      Component Value Date/Time   NA 142 07/21/2023 1144   NA 137 12/29/2021 1450   K 4.8 07/21/2023 1144   CL 105 07/21/2023 1144   CO2 30 07/21/2023 1144   GLUCOSE 92 07/21/2023 1144   BUN 23 07/21/2023 1144   BUN 21 12/29/2021 1450   CREATININE 1.24 (H) 07/21/2023 1144   CALCIUM  10.0 07/21/2023 1144   PROT 7.1 07/21/2023 1144   PROT 6.9 12/29/2021 1450   ALBUMIN 4.5 12/29/2021 1450   AST 24 07/21/2023 1144   ALT 12 07/21/2023 1144   ALKPHOS 53 12/29/2021 1450   BILITOT 0.9 07/21/2023 1144  BILITOT 0.6 12/29/2021 1450   GFRNONAA 30 (L) 05/16/2021 1817   GFRNONAA 41 (L) 08/20/2020 1544   GFRAA 48 (L) 08/20/2020 1544   No results found for: CHOL, HDL, LDLCALC, LDLDIRECT, TRIG, CHOLHDL No results found for: YHAJ8R Lab Results  Component Value Date   VITAMINB12 1,421 (H) 05/17/2021   Lab Results  Component Value Date   TSH 1.243 05/17/2021    PHYSICAL EXAM:  Vitals:   09/20/23 1614  BP: (!) 140/88  Pulse: 84   No data found. Body mass index is 20.15 kg/m.   Wt Readings from Last 3 Encounters:  09/20/23 117 lb 6.4 oz (53.3 kg)  07/21/23 120 lb (54.4 kg)  04/13/23 118 lb (53.5 kg)     Ht Readings from Last 3 Encounters:  09/20/23 5' 4 (1.626 m)  07/21/23 5' 4 (1.626 m)  04/13/23  5' 4 (1.626 m)      General: The patient is awake, alert and appears not in acute distress and groomed. Head: Normocephalic, atraumatic.  Neck is supple. Cardiovascular:  Regular rate and cardiac rhythm by pulse, without distended neck veins. Respiratory: no shortness of breath  Skin:  Without evidence of ankle edema, or rash.     NEUROLOGIC EXAM: The patient is awake and alert, oriented to place and time.   Memory subjective described as intact.  Attention span & concentration ability appears normal.     09/20/2023    4:17 PM 10/27/2022    3:54 PM 06/17/2021    3:11 PM 08/05/2020    1:06 PM 07/09/2019    3:14 PM  Montreal Cognitive Assessment   Visuospatial/ Executive (0/5) 1 3 2 4 3   Naming (0/3) 3 2 2 3 2   Attention: Read list of digits (0/2) 2 0 1 2 2   Attention: Read list of letters (0/1) 0 1 1 1 1   Attention: Serial 7 subtraction starting at 100 (0/3) 3 3 0 3 3  Language: Repeat phrase (0/2) 0 0 2 0 2  Language : Fluency (0/1) 0 0 0 1 0  Abstraction (0/2) 2 2 2 2 2   Delayed Recall (0/5) 4 4 5 5 5   Orientation (0/6) 6 6 5 6 6   Total 21 21 20 27 26   Adjusted Score (based on education)    27        09/01/2022    1:13 PM 05/10/2022    2:53 PM 11/16/2021    2:44 PM  MMSE - Mini Mental State Exam  Orientation to time 4 5 5   Orientation to Place 5 5 5   Registration 3 3 3   Attention/ Calculation 3 4 5   Recall 2 3 2   Language- name 2 objects 2 2 2   Language- repeat 1 1 1   Language- follow 3 step command 3 3 3   Language- read & follow direction 1 1 1   Write a sentence 1 1 1   Copy design 1 0 0  Total score 26 28 28       Speech is fluent,  with dysphonia.  Mood and affect are appropriate.   Cranial nerves: no loss of smell or taste reported  Pupils are equal and briskly reactive to light. Funduscopic exam .  Extraocular movements in vertical and horizontal planes were intact and without nystagmus. No Diplopia. Visual fields by finger perimetry are intact. Hearing was  intact to soft voice and finger rubbing.    Facial sensation intact to fine touch.  Facial motor strength is symmetric and tongue and uvula move  midline.  Neck ROM : rotation, tilt and flexion extension were normal for age and shoulder shrug was symmetrical.    Motor exam:  Symmetric bulk, tone and ROM.   Tone with cog-wheeling, but symmetric grip strength . No tremor.    Sensory:  Fine touch and vibration  normal.  Proprioception tested in the upper extremities was normal.   Coordination:  The Finger-to-nose maneuver was intact without evidence of ataxia, dysmetria or tremor.   Gait and station: Patient could rise unassisted from a seated position, walked without assistive device.  She walks slow but steady.  Stance is of normal width/ base and the patient turned with 4 steps.  Toe and heel walk were deferred.  Deep tendon reflexes: in the  upper and lower extremities are symmetric and intact.  Babinski response was deferred .   ASSESSMENT AND PLAN :   89 y.o. year old female  here with:    1) MCI with a more visio-spatial, impairment - and some cogwheeling.  Lewy body ?  Her mental status seems OK- she is oriented.   She has given up driving, and this is a good decision.   2)  dx of AVM related night terrors versus vivid REM BD-\I will refill her klonopin  , no vivid dreams at night.    Pt is asking about additional clonazepam  because her mail order pharmacy is delayed in sending refill.    I would like to thank  Nichole Senior, Md [address] [place],  [place] [address] for allowing me to meet with this pleasant patient.    The patient's condition requires frequent monitoring and adjustments in the treatment plan, reflecting the ongoing complexity of care.  This provider is the continuing focal point for all needed services for this condition.  After spending a total time of  30  minutes face to face and time for  history taking, physical and neurologic examination, review  of laboratory studies,  personal review of imaging studies, reports and results of other testing and review of referral information / records as far as provided in visit,   Electronically signed by: Dedra Gores, MD 09/20/2023 4:32 PM  [facility] Sleep Board certified by The [employer] of Sleep Medicine and Diplomate of the [employer] of Sleep Medicine. Board certified In Neurology through the ABPN, Fellow of the [employer] of Neurology.

## 2023-09-22 DIAGNOSIS — M19042 Primary osteoarthritis, left hand: Secondary | ICD-10-CM | POA: Diagnosis not present

## 2023-09-22 DIAGNOSIS — M8008XS Age-related osteoporosis with current pathological fracture, vertebra(e), sequela: Secondary | ICD-10-CM | POA: Diagnosis not present

## 2023-09-22 DIAGNOSIS — M19071 Primary osteoarthritis, right ankle and foot: Secondary | ICD-10-CM | POA: Diagnosis not present

## 2023-09-22 DIAGNOSIS — M940 Chondrocostal junction syndrome [Tietze]: Secondary | ICD-10-CM | POA: Diagnosis not present

## 2023-09-22 DIAGNOSIS — M5030 Other cervical disc degeneration, unspecified cervical region: Secondary | ICD-10-CM | POA: Diagnosis not present

## 2023-09-22 DIAGNOSIS — M5116 Intervertebral disc disorders with radiculopathy, lumbar region: Secondary | ICD-10-CM | POA: Diagnosis not present

## 2023-09-22 DIAGNOSIS — M19072 Primary osteoarthritis, left ankle and foot: Secondary | ICD-10-CM | POA: Diagnosis not present

## 2023-09-22 DIAGNOSIS — M419 Scoliosis, unspecified: Secondary | ICD-10-CM | POA: Diagnosis not present

## 2023-09-22 DIAGNOSIS — M19041 Primary osteoarthritis, right hand: Secondary | ICD-10-CM | POA: Diagnosis not present

## 2023-09-24 DIAGNOSIS — M419 Scoliosis, unspecified: Secondary | ICD-10-CM | POA: Diagnosis not present

## 2023-09-24 DIAGNOSIS — M5030 Other cervical disc degeneration, unspecified cervical region: Secondary | ICD-10-CM | POA: Diagnosis not present

## 2023-09-24 DIAGNOSIS — M940 Chondrocostal junction syndrome [Tietze]: Secondary | ICD-10-CM | POA: Diagnosis not present

## 2023-09-24 DIAGNOSIS — M19042 Primary osteoarthritis, left hand: Secondary | ICD-10-CM | POA: Diagnosis not present

## 2023-09-24 DIAGNOSIS — M19071 Primary osteoarthritis, right ankle and foot: Secondary | ICD-10-CM | POA: Diagnosis not present

## 2023-09-24 DIAGNOSIS — M19041 Primary osteoarthritis, right hand: Secondary | ICD-10-CM | POA: Diagnosis not present

## 2023-09-24 DIAGNOSIS — M5116 Intervertebral disc disorders with radiculopathy, lumbar region: Secondary | ICD-10-CM | POA: Diagnosis not present

## 2023-09-24 DIAGNOSIS — M8008XS Age-related osteoporosis with current pathological fracture, vertebra(e), sequela: Secondary | ICD-10-CM | POA: Diagnosis not present

## 2023-09-24 DIAGNOSIS — M19072 Primary osteoarthritis, left ankle and foot: Secondary | ICD-10-CM | POA: Diagnosis not present

## 2023-09-27 DIAGNOSIS — M5030 Other cervical disc degeneration, unspecified cervical region: Secondary | ICD-10-CM | POA: Diagnosis not present

## 2023-09-27 DIAGNOSIS — M19072 Primary osteoarthritis, left ankle and foot: Secondary | ICD-10-CM | POA: Diagnosis not present

## 2023-09-27 DIAGNOSIS — M419 Scoliosis, unspecified: Secondary | ICD-10-CM | POA: Diagnosis not present

## 2023-09-27 DIAGNOSIS — M8008XS Age-related osteoporosis with current pathological fracture, vertebra(e), sequela: Secondary | ICD-10-CM | POA: Diagnosis not present

## 2023-09-27 DIAGNOSIS — M5116 Intervertebral disc disorders with radiculopathy, lumbar region: Secondary | ICD-10-CM | POA: Diagnosis not present

## 2023-09-27 DIAGNOSIS — M19042 Primary osteoarthritis, left hand: Secondary | ICD-10-CM | POA: Diagnosis not present

## 2023-09-27 DIAGNOSIS — M19041 Primary osteoarthritis, right hand: Secondary | ICD-10-CM | POA: Diagnosis not present

## 2023-09-27 DIAGNOSIS — M940 Chondrocostal junction syndrome [Tietze]: Secondary | ICD-10-CM | POA: Diagnosis not present

## 2023-09-27 DIAGNOSIS — M19071 Primary osteoarthritis, right ankle and foot: Secondary | ICD-10-CM | POA: Diagnosis not present

## 2023-09-28 DIAGNOSIS — H04123 Dry eye syndrome of bilateral lacrimal glands: Secondary | ICD-10-CM | POA: Diagnosis not present

## 2023-09-28 DIAGNOSIS — Z961 Presence of intraocular lens: Secondary | ICD-10-CM | POA: Diagnosis not present

## 2023-09-28 DIAGNOSIS — H5051 Esophoria: Secondary | ICD-10-CM | POA: Diagnosis not present

## 2023-09-29 ENCOUNTER — Ambulatory Visit: Payer: Medicare HMO | Admitting: Rheumatology

## 2023-09-29 DIAGNOSIS — H9203 Otalgia, bilateral: Secondary | ICD-10-CM | POA: Diagnosis not present

## 2023-09-29 DIAGNOSIS — H903 Sensorineural hearing loss, bilateral: Secondary | ICD-10-CM | POA: Diagnosis not present

## 2023-09-29 DIAGNOSIS — H938X3 Other specified disorders of ear, bilateral: Secondary | ICD-10-CM | POA: Diagnosis not present

## 2023-09-30 ENCOUNTER — Ambulatory Visit: Attending: Cardiology | Admitting: Cardiology

## 2023-09-30 ENCOUNTER — Encounter: Payer: Self-pay | Admitting: Cardiology

## 2023-09-30 VITALS — BP 145/69 | HR 77 | Resp 18 | Ht 64.0 in | Wt 118.6 lb

## 2023-09-30 DIAGNOSIS — M19071 Primary osteoarthritis, right ankle and foot: Secondary | ICD-10-CM | POA: Diagnosis not present

## 2023-09-30 DIAGNOSIS — I10 Essential (primary) hypertension: Secondary | ICD-10-CM

## 2023-09-30 DIAGNOSIS — M5116 Intervertebral disc disorders with radiculopathy, lumbar region: Secondary | ICD-10-CM | POA: Diagnosis not present

## 2023-09-30 DIAGNOSIS — I951 Orthostatic hypotension: Secondary | ICD-10-CM

## 2023-09-30 DIAGNOSIS — R0989 Other specified symptoms and signs involving the circulatory and respiratory systems: Secondary | ICD-10-CM

## 2023-09-30 DIAGNOSIS — M940 Chondrocostal junction syndrome [Tietze]: Secondary | ICD-10-CM | POA: Diagnosis not present

## 2023-09-30 DIAGNOSIS — M5030 Other cervical disc degeneration, unspecified cervical region: Secondary | ICD-10-CM | POA: Diagnosis not present

## 2023-09-30 DIAGNOSIS — M19042 Primary osteoarthritis, left hand: Secondary | ICD-10-CM | POA: Diagnosis not present

## 2023-09-30 DIAGNOSIS — N1832 Chronic kidney disease, stage 3b: Secondary | ICD-10-CM | POA: Diagnosis not present

## 2023-09-30 DIAGNOSIS — M19041 Primary osteoarthritis, right hand: Secondary | ICD-10-CM | POA: Diagnosis not present

## 2023-09-30 DIAGNOSIS — M8008XS Age-related osteoporosis with current pathological fracture, vertebra(e), sequela: Secondary | ICD-10-CM | POA: Diagnosis not present

## 2023-09-30 DIAGNOSIS — M19072 Primary osteoarthritis, left ankle and foot: Secondary | ICD-10-CM | POA: Diagnosis not present

## 2023-09-30 DIAGNOSIS — M419 Scoliosis, unspecified: Secondary | ICD-10-CM | POA: Diagnosis not present

## 2023-09-30 NOTE — Patient Instructions (Signed)

## 2023-09-30 NOTE — Progress Notes (Signed)
 Cardiology Office Note:  .   Date:  09/30/2023  ID:  Gloria Anderson, DOB [birth year], MRN [id number] PCP: Nichole Senior, MD  [facility] Providers Cardiologist:  Gordy Bergamo, MD   History of Present Illness: .   Gloria Anderson is a 89 y.o. Caucasian female patient with with supine hypertension and orthostatic hypotension, chronic stage 3b CKD, mild hyperlipidemia, moderate MR and TR, moderate pulmonary hypertension and CT scan of the chest revealing evidence of interstitial lung disease stable from 2020 and 2023 and aortic atherosclerosis and three-vessel coronary calcification.  She is extremely sensitive to blood pressure variations.  She could not tolerate even minimal doses of antihypertensive medications due to marked dizziness and low blood pressure, systolic blood pressure dropping down to 70 mmHg even with carvedilol  3.125 mg twice daily. She does sleep on a wedge at night due to orthostatic hypertension.  Echocardiogram 12/13/2018: Normal LVEF at 67% ejection fraction, moderate MR, moderate tricuspid regurgitation and moderate pulm hypertension, PASP 50 mmHg with small pericardial circumferential effusion.  Carotid duplex 07/04/2019 minimal disease  Cardiac telemetry 05/12/2021 for 5 days: Brief atrial tachycardia episodes and rare PACs and PVCs and no A-fib.  Symptomatic atrial tachycardia, longest 4 hours and 45 minutes Discussed the use of AI scribe software for clinical note transcription with the patient, who gave verbal consent to proceed.  History of Present Illness Gloria Anderson is an 89 year old female with severe hypertension who presents for blood pressure management.  She experiences occasional dizziness and regularly monitors her blood pressure. Her current management includes carvedilol  as needed, especially during stressful situations, and amlodipine 3.125 mg as needed when her blood pressure exceeds 150 mmHg. She takes atorvastatin  10 mg. A carotid  ultrasound in 2021 showed no blockages. Her kidney function has remained stable over the years.   Labs   Lab Results  Component Value Date   NA 142 07/21/2023   K 4.8 07/21/2023   CO2 30 07/21/2023   GLUCOSE 92 07/21/2023   BUN 23 07/21/2023   CREATININE 1.24 (H) 07/21/2023   CALCIUM  10.0 07/21/2023   EGFR 35.0 08/18/2023   GFRNONAA 30 (L) 05/16/2021      Latest Ref Rng & Units 07/21/2023   11:44 AM 04/13/2023    1:16 PM 11/09/2022    3:31 PM  BMP  Glucose 65 - 99 mg/dL 92  842  92   BUN 7 - 25 mg/dL 23  25  25    Creatinine 0.60 - 0.95 mg/dL 8.75  8.77  8.84   BUN/Creat Ratio 6 - 22 (calc) 19  20  22    Sodium 135 - 146 mmol/L 142  143  141   Potassium 3.5 - 5.3 mmol/L 4.8  4.3  4.5   Chloride 98 - 110 mmol/L 105  104  103   CO2 20 - 32 mmol/L 30  31  28    Calcium  8.6 - 10.4 mg/dL 89.9  89.9  9.9       Latest Ref Rng & Units 07/21/2023   11:44 AM 04/13/2023    1:16 PM 11/09/2022    3:31 PM  CBC  WBC 3.8 - 10.8 Thousand/uL 5.0  5.3  5.2   Hemoglobin 11.7 - 15.5 g/dL 89.0  89.0  89.6   Hematocrit 35.0 - 45.0 % 32.8  31.6  30.4   Platelets 140 - 400 Thousand/uL 165  181  205      External Labs:  Care Everywhere PCP labs 07/15/2023:  A1c 4.8%.  TSH normal at 2.590.  Vitamin D  72.7.  Labs 01/10/2023:  Serum glucose 114 mg, BUN 25, creatinine 1.34, EGFR 39 mL, potassium 4.8.  ROS  Review of Systems  Cardiovascular:  Negative for chest pain, dyspnea on exertion and leg swelling.  Neurological:  Positive for dizziness (rare).   Physical Exam:   VS:  BP (!) 145/69 (BP Location: Left Arm, Patient Position: Sitting, Cuff Size: Normal)   Pulse 77   Resp 18   Ht 5' 4 (1.626 m)   Wt 118 lb 9.6 oz (53.8 kg)   SpO2 98%   BMI 20.36 kg/m    Wt Readings from Last 3 Encounters:  09/30/23 118 lb 9.6 oz (53.8 kg)  09/20/23 117 lb 6.4 oz (53.3 kg)  07/21/23 120 lb (54.4 kg)    Physical Exam Neck:     Vascular: No JVD.  Cardiovascular:     Rate and Rhythm: Normal rate  and regular rhythm.     Pulses: Intact distal pulses.          Carotid pulses are  on the right side with bruit and  on the left side with bruit.    Heart sounds: Normal heart sounds. No murmur heard.    No gallop.  Pulmonary:     Effort: Pulmonary effort is normal.     Breath sounds: Normal breath sounds.  Abdominal:     General: Bowel sounds are normal.     Palpations: Abdomen is soft.  Musculoskeletal:     Right lower leg: No edema.     Left lower leg: No edema.    Studies Reviewed: SABRA     EKG:    EKG Interpretation Date/Time:  Friday September 30 2023 09:38:30 EDT Ventricular Rate:  77 PR Interval:  142 QRS Duration:  88 QT Interval:  394 QTC Calculation: 445 R Axis:   43  Text Interpretation: Normal sinus rhythm When compared with ECG of 16-May-2021 13:44, No significant change was found Confirmed by Ladona Milan [phone]) on 09/30/2023 10:05:31 AM  EKG 10/28/2022: Normal sinus rhythm with rate of 83 bpm, left atrial enlargement, otherwise normal EKG.   Medications ordered    No orders of the defined types were placed in this encounter.    ASSESSMENT AND PLAN: .      ICD-10-CM   1. Supine hypertension  I10 EKG 12-Lead    2. Orthostatic hypotension  I95.1     3. Bilateral carotid bruits  R09.89     4. Stage 3b chronic kidney disease (HCC)  N18.32      Assessment & Plan Hypertensive heart disease with supine hypertension and orthostatic hypotension Severe hypertension with orthostatic hypotension and supine hypertension. The etiology is unclear, though age may be a contributing factor. Occasional dizziness is reported. Blood pressure management is effective with carvedilol  as needed. Standing blood pressure is well-controlled at 130/67 mmHg, preventing dizziness and fatigue. - Continue carvedilol  as needed for elevated blood pressure. - Monitor blood pressure while standing for three minutes before taking medication. - Educate on the importance of treating standing  blood pressure rather than sitting or supine readings. - Although I heard carotid bruit, she has had carotid artery duplex in the past revealing nearly normal carotid artery send suspect tortuosity.  Chronic kidney disease stage 3A Stage 3A chronic kidney disease with stable kidney function. eGFR is 39 mL/min, close to stage 3A. No progression of the disease and no immediate concerns. - Continue current management and monitoring of kidney function. -  Reassure about stable kidney function.  Hyperlipidemia On atorvastatin  10 mg to prevent plaque buildup in the heart and carotid arteries. Previous imaging in 2021 showed no carotid artery blockage. Cholesterol management is effective. - Continue atorvastatin  10 mg daily.  I will see her back on a as needed basis.   Signed,  Gordy Bergamo, MD, [facility] 09/30/2023, 10:09 AM [facility] [address] [place], [place] [address] Phone: [phone]. Fax:  [phone]

## 2023-10-03 ENCOUNTER — Telehealth: Payer: Self-pay

## 2023-10-03 NOTE — Telephone Encounter (Signed)
 Patient called the office stating she keeps getting a call from somebody at the office saying she has an upcoming appointment. Confirmed that she does not have an appointment until 01/31/2024 an that noc  calls have been placed from our office. Recommended she ask for a name the next time they call. Verbalized understanding

## 2023-10-04 DIAGNOSIS — M19041 Primary osteoarthritis, right hand: Secondary | ICD-10-CM | POA: Diagnosis not present

## 2023-10-04 DIAGNOSIS — M19071 Primary osteoarthritis, right ankle and foot: Secondary | ICD-10-CM | POA: Diagnosis not present

## 2023-10-04 DIAGNOSIS — M19072 Primary osteoarthritis, left ankle and foot: Secondary | ICD-10-CM | POA: Diagnosis not present

## 2023-10-04 DIAGNOSIS — M5030 Other cervical disc degeneration, unspecified cervical region: Secondary | ICD-10-CM | POA: Diagnosis not present

## 2023-10-04 DIAGNOSIS — M940 Chondrocostal junction syndrome [Tietze]: Secondary | ICD-10-CM | POA: Diagnosis not present

## 2023-10-04 DIAGNOSIS — M8008XS Age-related osteoporosis with current pathological fracture, vertebra(e), sequela: Secondary | ICD-10-CM | POA: Diagnosis not present

## 2023-10-04 DIAGNOSIS — M19042 Primary osteoarthritis, left hand: Secondary | ICD-10-CM | POA: Diagnosis not present

## 2023-10-04 DIAGNOSIS — M419 Scoliosis, unspecified: Secondary | ICD-10-CM | POA: Diagnosis not present

## 2023-10-04 DIAGNOSIS — M5116 Intervertebral disc disorders with radiculopathy, lumbar region: Secondary | ICD-10-CM | POA: Diagnosis not present

## 2023-10-05 ENCOUNTER — Other Ambulatory Visit: Payer: Self-pay | Admitting: Rheumatology

## 2023-10-05 ENCOUNTER — Other Ambulatory Visit (HOSPITAL_COMMUNITY): Payer: Self-pay

## 2023-10-06 ENCOUNTER — Telehealth: Payer: Self-pay

## 2023-10-06 ENCOUNTER — Other Ambulatory Visit (HOSPITAL_COMMUNITY): Payer: Self-pay

## 2023-10-06 NOTE — Telephone Encounter (Signed)
 Pt cld because [facility] informed Pt since she takes a half tablet of clonazepam  0.5mg  that they will only be able to fill the medication 15 tabs each time which is a 30 day supply. Pt is upset and concerned she will run out of medication and won't be able to get it refilled before she runs out. Informed Pt if she is only taking a half tablet she will have a full 30 days of medication and to inquire with [facility] the earliest possible refill date and if they will consider making the medication auto refill so she will not have to call in each time. Pt stated she will inquire with [facility] and voiced thanks for the information.

## 2023-10-06 NOTE — Telephone Encounter (Signed)
   The PA TEAM did not submit a PA and I could not record of recent PA documented in chart-however received this denial via fax. Denial letter has been placed in chart. Denial letter is only stating pt can only get a 30DS at a time.

## 2023-10-10 MED ORDER — CLONAZEPAM 0.5 MG PO TABS: 0.2500 mg | ORAL_TABLET | Freq: Every day | ORAL | 2 refills | Status: DC

## 2023-10-10 NOTE — Addendum Note (Signed)
 Addended by: Bria Sparr K on: 10/10/2023 10:31 AM   Modules accepted: Orders

## 2023-10-10 NOTE — Telephone Encounter (Signed)
 Spoke w/Pt to explain latest denial regarding Medicare Part D/[employer] no longer covering 45 day supply (actually 90 days since Pt takes 0.5 tab) will only do 30 day supply (actually 60 days). Pt voiced understanding and Pt and caregiver Mar) asked for any prescriptions to be sent to [facility] as this is the preferred [facility] pharmacy. Caregiver stated [employer] cld Pt Friday to make her aware they are sending 15 tabs (30 day supply) which will arrive today and after that they will require a new script. Informed Pt and caregiver a new script for 60 day supply (30 tabs) will be sent to [facility]. Pt and caregiver voiced understanding and thanks for the call back.

## 2023-10-10 NOTE — Telephone Encounter (Addendum)
 Patient would like a call back to discuss prescription for clonazePAM  (KLONOPIN ) 0.5 MG tablet.

## 2023-10-11 DIAGNOSIS — M5116 Intervertebral disc disorders with radiculopathy, lumbar region: Secondary | ICD-10-CM | POA: Diagnosis not present

## 2023-10-11 DIAGNOSIS — M19042 Primary osteoarthritis, left hand: Secondary | ICD-10-CM | POA: Diagnosis not present

## 2023-10-11 DIAGNOSIS — M5030 Other cervical disc degeneration, unspecified cervical region: Secondary | ICD-10-CM | POA: Diagnosis not present

## 2023-10-11 DIAGNOSIS — M940 Chondrocostal junction syndrome [Tietze]: Secondary | ICD-10-CM | POA: Diagnosis not present

## 2023-10-11 DIAGNOSIS — M419 Scoliosis, unspecified: Secondary | ICD-10-CM | POA: Diagnosis not present

## 2023-10-11 DIAGNOSIS — M19041 Primary osteoarthritis, right hand: Secondary | ICD-10-CM | POA: Diagnosis not present

## 2023-10-11 DIAGNOSIS — M19072 Primary osteoarthritis, left ankle and foot: Secondary | ICD-10-CM | POA: Diagnosis not present

## 2023-10-11 DIAGNOSIS — M19071 Primary osteoarthritis, right ankle and foot: Secondary | ICD-10-CM | POA: Diagnosis not present

## 2023-10-11 DIAGNOSIS — M8008XS Age-related osteoporosis with current pathological fracture, vertebra(e), sequela: Secondary | ICD-10-CM | POA: Diagnosis not present

## 2023-10-14 NOTE — Telephone Encounter (Signed)
 Pt cld to report she only received 15 tabs of clonazepam  from [facility]. Explained to Pt that is the last refill for the previous prescription of clonazepam  as the pharmacy was only filling 15 days each time. Informed Pt a new prescription for clonazepam  30 tabs with 2 refills was sent to [facility] on 10/10/23 so Pt should receive 30 tabs at next delivery. Pt asked when the delivery will be sent. Informed Pt she will need to check with [facility] for a delivery date. Pt voiced understanding and thanks for the explanation.

## 2023-10-17 DIAGNOSIS — M19071 Primary osteoarthritis, right ankle and foot: Secondary | ICD-10-CM | POA: Diagnosis not present

## 2023-10-17 DIAGNOSIS — M5030 Other cervical disc degeneration, unspecified cervical region: Secondary | ICD-10-CM | POA: Diagnosis not present

## 2023-10-17 DIAGNOSIS — M19072 Primary osteoarthritis, left ankle and foot: Secondary | ICD-10-CM | POA: Diagnosis not present

## 2023-10-17 DIAGNOSIS — M940 Chondrocostal junction syndrome [Tietze]: Secondary | ICD-10-CM | POA: Diagnosis not present

## 2023-10-17 DIAGNOSIS — M419 Scoliosis, unspecified: Secondary | ICD-10-CM | POA: Diagnosis not present

## 2023-10-17 DIAGNOSIS — M5116 Intervertebral disc disorders with radiculopathy, lumbar region: Secondary | ICD-10-CM | POA: Diagnosis not present

## 2023-10-17 DIAGNOSIS — M8008XS Age-related osteoporosis with current pathological fracture, vertebra(e), sequela: Secondary | ICD-10-CM | POA: Diagnosis not present

## 2023-10-17 DIAGNOSIS — M19041 Primary osteoarthritis, right hand: Secondary | ICD-10-CM | POA: Diagnosis not present

## 2023-10-17 DIAGNOSIS — M19042 Primary osteoarthritis, left hand: Secondary | ICD-10-CM | POA: Diagnosis not present

## 2023-10-18 ENCOUNTER — Other Ambulatory Visit: Payer: Self-pay | Admitting: Pharmacist

## 2023-10-18 ENCOUNTER — Ambulatory Visit: Attending: Rheumatology | Admitting: Pharmacist

## 2023-10-18 DIAGNOSIS — M810 Age-related osteoporosis without current pathological fracture: Secondary | ICD-10-CM | POA: Diagnosis not present

## 2023-10-18 DIAGNOSIS — E559 Vitamin D deficiency, unspecified: Secondary | ICD-10-CM | POA: Diagnosis not present

## 2023-10-18 DIAGNOSIS — Z7189 Other specified counseling: Secondary | ICD-10-CM

## 2023-10-18 LAB — COMPREHENSIVE METABOLIC PANEL WITH GFR
AG Ratio: 1.7 (calc) (ref 1.0–2.5)
ALT: 16 U/L (ref 6–29)
AST: 23 U/L (ref 10–35)
Albumin: 4.3 g/dL (ref 3.6–5.1)
Alkaline phosphatase (APISO): 54 U/L (ref 37–153)
BUN/Creatinine Ratio: 19 (calc) (ref 6–22)
BUN: 26 mg/dL — ABNORMAL HIGH (ref 7–25)
CO2: 29 mmol/L (ref 20–32)
Calcium: 10.1 mg/dL (ref 8.6–10.4)
Chloride: 105 mmol/L (ref 98–110)
Creat: 1.35 mg/dL — ABNORMAL HIGH (ref 0.60–0.95)
Globulin: 2.6 g/dL (ref 1.9–3.7)
Glucose, Bld: 99 mg/dL (ref 65–99)
Potassium: 4.9 mmol/L (ref 3.5–5.3)
Sodium: 142 mmol/L (ref 135–146)
Total Bilirubin: 0.8 mg/dL (ref 0.2–1.2)
Total Protein: 6.9 g/dL (ref 6.1–8.1)
eGFR: 38 mL/min/1.73m2 — ABNORMAL LOW (ref >=60)

## 2023-10-18 LAB — VITAMIN D 25 HYDROXY (VIT D DEFICIENCY, FRACTURES): Vit D, 25-Hydroxy: 81 ng/mL (ref 30–100)

## 2023-10-18 NOTE — Progress Notes (Signed)
 Next Prolia  SQ overdue (was due 05/10/2023). Referral placed to [facility] Diagnosis: age-related osteoporosis  Dose: 60 mg SQ every 6 months  Last Clinic Visit: 07/21/2023 Next Clinic Visit: 01/31/2024  Last Prolia  dose: 11/11/2023  Labs: updated 10/18/2023  Orders placed for Prolia  x 1 dose. No premedicatons required.   Will follow-up to ensured scheduled and completed  Froylan Hobby, PharmD, MPH, BCPS, CPP Clinical Pharmacist (Rheumatology and Pulmonology)

## 2023-10-18 NOTE — Progress Notes (Unsigned)
 Pharmacy Note  Subjective:  Patient presents today to [facility] requesting to speak about Prolia . Her last Prolia  was administered on 11/11/2022  She was last seen by Dr. Dolphus on 07/21/2023 and had settled on starting Reclast. However shortly after appointment, she stated she did not want an infusion. After many calls with patient, she had ultimately decided to reach back out to office once she made a decision  Objective: CMP     Component Value Date/Time   NA 142 07/21/2023 1144   NA 137 12/29/2021 1450   K 4.8 07/21/2023 1144   CL 105 07/21/2023 1144   CO2 30 07/21/2023 1144   GLUCOSE 92 07/21/2023 1144   BUN 23 07/21/2023 1144   BUN 21 12/29/2021 1450   CREATININE 1.24 (H) 07/21/2023 1144   CALCIUM  10.0 07/21/2023 1144   PROT 7.1 07/21/2023 1144   PROT 6.9 12/29/2021 1450   ALBUMIN 4.5 12/29/2021 1450   AST 24 07/21/2023 1144   ALT 12 07/21/2023 1144   ALKPHOS 53 12/29/2021 1450   BILITOT 0.9 07/21/2023 1144   BILITOT 0.6 12/29/2021 1450   GFRNONAA 30 (L) 05/16/2021 1817   GFRNONAA 41 (L) 08/20/2020 1544   GFRAA 48 (L) 08/20/2020 1544   Vitamin D  Lab Results  Component Value Date   VD25OH 58 04/13/2023   T-score in 04/2018 (femoral left hip): -2.3  Assessment/Plan:  Patient has been on Prolia  but delayed now for 5 months due to cost. Patient has not felt comfortable paying >$900 forProlia  for in-office injection. However she is concerned about risk for fractures with delay in treatment and if it is too late to get benefit back from Prolia . Unfortunately we cannot extrapolate risk based on specific length in treatment delay. Purpose of treatment is to minimize risk of fractures and strengthen bones.  She had assumed that receiving Prolia  at the infusion center meant that it would be an infusion formulation. We discussed that Prolia  whether administered in office or at hte infusion center would be a subcutaneous injection. The only different is site of  care and billing through insurance  At last visit we had discussed Reclast however patient does not want to receive any infusions. Discussed today that infusion is relatively well-tolerated by most of our patients. We plan to pre-medicate with infusions but she does not feel comfortable moving forward  She ultimately has decided to move forward with Prolia  at the infusion center. Updating CMP and Vitamin D  today. Referral to [facility] placed for Prolia . Patient aware that the infusion center will reach out to schedule appointment once insurance benefits are completed  Mardel Grudzien, PharmD, MPH, BCPS, CPP Clinical Pharmacist (Rheumatology and Pulmonology)

## 2023-10-19 ENCOUNTER — Ambulatory Visit: Payer: Self-pay | Admitting: Rheumatology

## 2023-10-19 NOTE — Progress Notes (Signed)
 Vitamin D  is high normal .  Creatinine is elevated.  Please forward results to her PCP.

## 2023-10-21 NOTE — Progress Notes (Signed)
 Prolia  scheduled for 10/25/2023

## 2023-10-25 ENCOUNTER — Ambulatory Visit (INDEPENDENT_AMBULATORY_CARE_PROVIDER_SITE_OTHER)

## 2023-10-25 ENCOUNTER — Telehealth: Payer: Self-pay

## 2023-10-25 VITALS — BP 175/74 | HR 88 | Resp 14 | Ht 64.0 in | Wt 117.6 lb

## 2023-10-25 DIAGNOSIS — M810 Age-related osteoporosis without current pathological fracture: Secondary | ICD-10-CM

## 2023-10-25 MED ORDER — DENOSUMAB 60 MG/ML ~~LOC~~ SOSY
60.0000 mg | PREFILLED_SYRINGE | Freq: Once | SUBCUTANEOUS | Status: AC
  Administered 2023-10-25: 60 mg via SUBCUTANEOUS
  Filled 2023-10-25: qty 1

## 2023-10-25 NOTE — Progress Notes (Signed)
 Diagnosis: Osteoporosis  Provider:  Chilton Greathouse MD  Procedure: Injection  Prolia (Denosumab), Dose: 60 mg, Site: subcutaneous, Number of injections: 1  Injection Site(s): Left arm  Post Care: Patient declined observation  Discharge: Condition: Good, Destination: Home . AVS Provided  Performed by:  Loney Hering, LPN

## 2023-10-25 NOTE — Telephone Encounter (Signed)
 Patient did not want to schedule next prolia  injection at this time until she see bill from today's injection.

## 2023-10-27 DIAGNOSIS — M19042 Primary osteoarthritis, left hand: Secondary | ICD-10-CM | POA: Diagnosis not present

## 2023-10-27 DIAGNOSIS — M8008XS Age-related osteoporosis with current pathological fracture, vertebra(e), sequela: Secondary | ICD-10-CM | POA: Diagnosis not present

## 2023-10-27 DIAGNOSIS — M940 Chondrocostal junction syndrome [Tietze]: Secondary | ICD-10-CM | POA: Diagnosis not present

## 2023-10-27 DIAGNOSIS — M5030 Other cervical disc degeneration, unspecified cervical region: Secondary | ICD-10-CM | POA: Diagnosis not present

## 2023-10-27 DIAGNOSIS — M419 Scoliosis, unspecified: Secondary | ICD-10-CM | POA: Diagnosis not present

## 2023-10-27 DIAGNOSIS — M5116 Intervertebral disc disorders with radiculopathy, lumbar region: Secondary | ICD-10-CM | POA: Diagnosis not present

## 2023-10-27 DIAGNOSIS — M19041 Primary osteoarthritis, right hand: Secondary | ICD-10-CM | POA: Diagnosis not present

## 2023-10-27 DIAGNOSIS — M19072 Primary osteoarthritis, left ankle and foot: Secondary | ICD-10-CM | POA: Diagnosis not present

## 2023-10-27 DIAGNOSIS — M19071 Primary osteoarthritis, right ankle and foot: Secondary | ICD-10-CM | POA: Diagnosis not present

## 2023-11-01 DIAGNOSIS — M8008XS Age-related osteoporosis with current pathological fracture, vertebra(e), sequela: Secondary | ICD-10-CM | POA: Diagnosis not present

## 2023-11-01 DIAGNOSIS — M19042 Primary osteoarthritis, left hand: Secondary | ICD-10-CM | POA: Diagnosis not present

## 2023-11-01 DIAGNOSIS — M940 Chondrocostal junction syndrome [Tietze]: Secondary | ICD-10-CM | POA: Diagnosis not present

## 2023-11-01 DIAGNOSIS — M419 Scoliosis, unspecified: Secondary | ICD-10-CM | POA: Diagnosis not present

## 2023-11-01 DIAGNOSIS — M19041 Primary osteoarthritis, right hand: Secondary | ICD-10-CM | POA: Diagnosis not present

## 2023-11-01 DIAGNOSIS — M19071 Primary osteoarthritis, right ankle and foot: Secondary | ICD-10-CM | POA: Diagnosis not present

## 2023-11-01 DIAGNOSIS — M19072 Primary osteoarthritis, left ankle and foot: Secondary | ICD-10-CM | POA: Diagnosis not present

## 2023-11-01 DIAGNOSIS — M5030 Other cervical disc degeneration, unspecified cervical region: Secondary | ICD-10-CM | POA: Diagnosis not present

## 2023-11-01 DIAGNOSIS — M5116 Intervertebral disc disorders with radiculopathy, lumbar region: Secondary | ICD-10-CM | POA: Diagnosis not present

## 2023-11-07 ENCOUNTER — Other Ambulatory Visit: Payer: Self-pay | Admitting: Rheumatology

## 2023-11-07 NOTE — Telephone Encounter (Signed)
 Please clarify plaquenil  dose

## 2023-11-07 NOTE — Telephone Encounter (Signed)
 Patient is taking 1/2 a tablet 7 days a week.

## 2023-11-07 NOTE — Telephone Encounter (Signed)
 Last Fill: 08/10/2023  Eye exam: 12/01/2022 normal.   Labs: 08/17/2023 BUN 28 Creatine 1.44 eGFR 35   Next Visit: 01/31/2024  Last Visit: 07/21/2023  DX: Age-related osteoporosis without current pathological fracture   Current Dose per office note 07/21/2023:  Plaquenil  100 mg p.o. daily Monday to Friday   Okay to refill Plaquenil ?

## 2023-11-10 DIAGNOSIS — M419 Scoliosis, unspecified: Secondary | ICD-10-CM | POA: Diagnosis not present

## 2023-11-10 DIAGNOSIS — M19042 Primary osteoarthritis, left hand: Secondary | ICD-10-CM | POA: Diagnosis not present

## 2023-11-10 DIAGNOSIS — M5030 Other cervical disc degeneration, unspecified cervical region: Secondary | ICD-10-CM | POA: Diagnosis not present

## 2023-11-10 DIAGNOSIS — M8008XS Age-related osteoporosis with current pathological fracture, vertebra(e), sequela: Secondary | ICD-10-CM | POA: Diagnosis not present

## 2023-11-10 DIAGNOSIS — M19041 Primary osteoarthritis, right hand: Secondary | ICD-10-CM | POA: Diagnosis not present

## 2023-11-10 DIAGNOSIS — M19072 Primary osteoarthritis, left ankle and foot: Secondary | ICD-10-CM | POA: Diagnosis not present

## 2023-11-10 DIAGNOSIS — M19071 Primary osteoarthritis, right ankle and foot: Secondary | ICD-10-CM | POA: Diagnosis not present

## 2023-11-10 DIAGNOSIS — M5116 Intervertebral disc disorders with radiculopathy, lumbar region: Secondary | ICD-10-CM | POA: Diagnosis not present

## 2023-11-10 DIAGNOSIS — M940 Chondrocostal junction syndrome [Tietze]: Secondary | ICD-10-CM | POA: Diagnosis not present

## 2023-11-17 DIAGNOSIS — Z85828 Personal history of other malignant neoplasm of skin: Secondary | ICD-10-CM | POA: Diagnosis not present

## 2023-11-17 DIAGNOSIS — L2989 Other pruritus: Secondary | ICD-10-CM | POA: Diagnosis not present

## 2023-11-17 DIAGNOSIS — L814 Other melanin hyperpigmentation: Secondary | ICD-10-CM | POA: Diagnosis not present

## 2023-11-17 DIAGNOSIS — L821 Other seborrheic keratosis: Secondary | ICD-10-CM | POA: Diagnosis not present

## 2023-11-30 DIAGNOSIS — Z23 Encounter for immunization: Secondary | ICD-10-CM | POA: Diagnosis not present

## 2023-12-13 DIAGNOSIS — E039 Hypothyroidism, unspecified: Secondary | ICD-10-CM | POA: Diagnosis not present

## 2023-12-13 DIAGNOSIS — R7302 Impaired glucose tolerance (oral): Secondary | ICD-10-CM | POA: Diagnosis not present

## 2023-12-13 DIAGNOSIS — D649 Anemia, unspecified: Secondary | ICD-10-CM | POA: Diagnosis not present

## 2023-12-13 DIAGNOSIS — N1832 Chronic kidney disease, stage 3b: Secondary | ICD-10-CM | POA: Diagnosis not present

## 2023-12-13 DIAGNOSIS — M810 Age-related osteoporosis without current pathological fracture: Secondary | ICD-10-CM | POA: Diagnosis not present

## 2023-12-13 DIAGNOSIS — E785 Hyperlipidemia, unspecified: Secondary | ICD-10-CM | POA: Diagnosis not present

## 2023-12-13 DIAGNOSIS — I129 Hypertensive chronic kidney disease with stage 1 through stage 4 chronic kidney disease, or unspecified chronic kidney disease: Secondary | ICD-10-CM | POA: Diagnosis not present

## 2023-12-20 DIAGNOSIS — M353 Polymyalgia rheumatica: Secondary | ICD-10-CM | POA: Diagnosis not present

## 2023-12-20 DIAGNOSIS — Z1339 Encounter for screening examination for other mental health and behavioral disorders: Secondary | ICD-10-CM | POA: Diagnosis not present

## 2023-12-20 DIAGNOSIS — Z79899 Other long term (current) drug therapy: Secondary | ICD-10-CM | POA: Diagnosis not present

## 2023-12-20 DIAGNOSIS — I129 Hypertensive chronic kidney disease with stage 1 through stage 4 chronic kidney disease, or unspecified chronic kidney disease: Secondary | ICD-10-CM | POA: Diagnosis not present

## 2023-12-20 DIAGNOSIS — I700 Atherosclerosis of aorta: Secondary | ICD-10-CM | POA: Diagnosis not present

## 2023-12-20 DIAGNOSIS — I2584 Coronary atherosclerosis due to calcified coronary lesion: Secondary | ICD-10-CM | POA: Diagnosis not present

## 2023-12-20 DIAGNOSIS — Z23 Encounter for immunization: Secondary | ICD-10-CM | POA: Diagnosis not present

## 2023-12-20 DIAGNOSIS — R82998 Other abnormal findings in urine: Secondary | ICD-10-CM | POA: Diagnosis not present

## 2023-12-20 DIAGNOSIS — E039 Hypothyroidism, unspecified: Secondary | ICD-10-CM | POA: Diagnosis not present

## 2023-12-20 DIAGNOSIS — E785 Hyperlipidemia, unspecified: Secondary | ICD-10-CM | POA: Diagnosis not present

## 2023-12-20 DIAGNOSIS — M810 Age-related osteoporosis without current pathological fracture: Secondary | ICD-10-CM | POA: Diagnosis not present

## 2023-12-20 DIAGNOSIS — Z1331 Encounter for screening for depression: Secondary | ICD-10-CM | POA: Diagnosis not present

## 2023-12-20 DIAGNOSIS — N1832 Chronic kidney disease, stage 3b: Secondary | ICD-10-CM | POA: Diagnosis not present

## 2024-01-02 DIAGNOSIS — Z79899 Other long term (current) drug therapy: Secondary | ICD-10-CM | POA: Diagnosis not present

## 2024-01-02 DIAGNOSIS — H04123 Dry eye syndrome of bilateral lacrimal glands: Secondary | ICD-10-CM | POA: Diagnosis not present

## 2024-01-02 DIAGNOSIS — Z961 Presence of intraocular lens: Secondary | ICD-10-CM | POA: Diagnosis not present

## 2024-01-02 DIAGNOSIS — H518 Other specified disorders of binocular movement: Secondary | ICD-10-CM | POA: Diagnosis not present

## 2024-01-02 DIAGNOSIS — M353 Polymyalgia rheumatica: Secondary | ICD-10-CM | POA: Diagnosis not present

## 2024-01-05 ENCOUNTER — Other Ambulatory Visit (HOSPITAL_COMMUNITY): Payer: Self-pay | Admitting: Rheumatology

## 2024-01-18 NOTE — Progress Notes (Signed)
 Office Visit Note  Patient: Gloria Anderson             Date of Birth: Jul 24, 1933           MRN: [id number]             PCP: Nichole Senior, MD Referring: Nichole Senior, MD Visit Date: 01/31/2024 Occupation: Data Unavailable  Subjective:  Patient management  History of Present Illness: Gloria Anderson is a 89 y.o. female with osteoporosis, osteoarthritis and polymyalgia rheumatica.  She denies any increased muscular weakness or tenderness.  She denies any history of joint pain or joint swelling.  She has been taking Prolia  60 mg subcu every 6 months.  Her last Prolia  injection was on October 25, 2023.  She is on hydroxychloroquine  100 mg every other day without any interruption.  She continues to have some discomfort in her shoulders.    Activities of Daily Living:  Patient reports morning stiffness for 0 minutes.   Patient Denies nocturnal pain.  Difficulty dressing/grooming: Denies Difficulty climbing stairs: Denies Difficulty getting out of chair: Denies Difficulty using hands for taps, buttons, cutlery, and/or writing: Reports  Review of Systems  Constitutional:  Negative for fatigue.  HENT:  Positive for mouth dryness. Negative for mouth sores.   Eyes:  Positive for dryness.  Respiratory:  Negative for shortness of breath.   Cardiovascular:  Negative for chest pain and palpitations.  Gastrointestinal:  Positive for constipation. Negative for blood in stool and diarrhea.  Endocrine: Positive for increased urination.  Genitourinary:  Negative for involuntary urination.  Musculoskeletal:  Positive for muscle weakness. Negative for joint pain, gait problem, joint pain, joint swelling, myalgias, morning stiffness, muscle tenderness and myalgias.  Skin:  Positive for hair loss. Negative for color change, rash and sensitivity to sunlight.  Allergic/Immunologic: Negative for susceptible to infections.  Neurological:  Positive for dizziness. Negative for headaches.   Hematological:  Negative for swollen glands.  Psychiatric/Behavioral:  Positive for depressed mood. Negative for sleep disturbance. The patient is not nervous/anxious.     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HCC) 05/23/2019   Nocturnal seizures (HCC) 05/23/2019   Adult night terrors 01/09/2019   Degenerative lumbar spinal stenosis 08/15/2018   Memory difficulty 07/26/2017   Other parasomnia 07/26/2017   Anemia 06/02/2017   Osteopenia 06/02/2017   Spinal stenosis 06/02/2017   Iron deficiency anemia secondary to inadequate dietary iron intake 08/05/2016   Fatigue associated with anemia 08/05/2016   Primary osteoarthritis of both feet 02/04/2016   Supine hypertension 02/04/2016   PMR (polymyalgia rheumatica) 02/03/2016   Osteoarthritis, hand 02/03/2016   Age-related osteoporosis without current pathological fracture 02/03/2016   DDD (degenerative disc disease), lumbar 02/03/2016   High risk medication use 02/03/2016   Parasomnia, organic 08/04/2015   Parasomnia due to medical condition 02/03/2015   Cerebral cavernous malformation type 1 12/04/2013   Night terrors, adult 09/26/2013   HEMORRHOIDS-EXTERNAL 09/19/2009   GERD 09/19/2009   CONSTIPATION 09/19/2009   DYSPHAGIA 09/19/2009   History of colonic polyps 09/19/2009    Past Medical History:  Diagnosis  Date   Arthritis    Complication of anesthesia    Constipation    Depression    GERD (gastroesophageal reflux disease)    Hypertension     Hypothyroidism    Osteoporosis    Pneumonia 2018   Polymyalgia    Polymyalgia rheumatica    PONV (postoperative nausea and vomiting)     Family History  Problem Relation Age of Onset   Tuberculosis Mother 38   CVA Father    Stroke Father 53   Hypertension Sister 29   Lung cancer Brother 4   Hypertension Son    Past Surgical History:  Procedure Laterality Date   BASAL CELL CARCINOMA EXCISION  2022   on face   COLONOSCOPY W/ POLYPECTOMY     EYE SURGERY     both cataracts   LUMBAR LAMINECTOMY/DECOMPRESSION MICRODISCECTOMY N/A 08/15/2018   Procedure: Lumbar three to Lumbar five Decompressive lumbar laminectomy;  Surgeon: Unice Pac, MD;  Location: [facility] OR;  Service: Neurosurgery;  Laterality: N/A;   NASAL SINUS SURGERY     SHOULDER SURGERY Right 2007   repair   TEAR DUCT PROBING Bilateral    TONSILLECTOMY     TRIGGER FINGER RELEASE  03/19/2011   Procedure:  left middle finger RELEASE TRIGGER FINGER/A-1 PULLEY;  Surgeon: Lamar LULLA Leonor Mickey., MD;  Location: [facility];  Service: Orthopedics;  Laterality: Right;  Procedure:  Release Right Long and Ring Trigger Fingers, Release Left Long Trigger Finger, Injection Left Long Proximal Phalangeal Joint   TRIGGER FINGER RELEASE Right    Ring finger, middle finger   UPPER GASTROINTESTINAL ENDOSCOPY     Social History   Tobacco Use   Smoking status: Never    Passive exposure: Never   Smokeless tobacco: Never  Vaping Use   Vaping status: Never Used  Substance Use Topics   Alcohol  use: No    Alcohol /week: 0.0 standard drinks of alcohol    Drug use: Never   Social History   Social History Narrative   Patient is widowed.   Patient has two children.   Patient does not drink any caffeine.    Patient has a high school education.   Patient is right-handed.              Immunization History  Administered Date(s) Administered   Fluad Quad(high Dose 65+) 12/07/2021   INFLUENZA, HIGH DOSE SEASONAL PF 12/07/2015,  12/19/2016, 11/30/2017   Influenza Split 12/01/2010, 11/12/2011, 12/13/2011, 12/12/2012, 12/24/2013, 11/30/2019   Influenza, Quadrivalent, Recombinant, Inj, Pf 10/31/2018   Influenza,inj,Quad PF,6+ Mos 12/24/2013, 11/07/2014   PFIZER Comirnaty(Gray Top)Covid-19 Tri-Sucrose Vaccine 08/23/2020   PFIZER(Purple Top)SARS-COV-2 Vaccination 03/24/2019, 04/13/2019, 11/29/2019   Pneumococcal Conjugate-13 07/21/2016   Pneumococcal Polysaccharide-23 01/01/2009, 01/12/2013   Td,absorbed, Preservative Free, Adult Use, Lf Unspecified 10/18/2011   Tdap 06/05/2013   Zoster Recombinant(Shingrix) 08/01/2017, 10/10/2017   Zoster, Live 07/22/2010, 08/31/2010, 10/10/2017     Objective: Vital Signs: BP (!) 190/75   Pulse 85   Temp 98 F (36.7 C)   Resp 13   Ht 5' 4 (1.626 m)   Wt 121 lb 3.2 oz (55 kg)   BMI 20.80 kg/m    Physical Exam Vitals and nursing note reviewed.  Constitutional:      Appearance: She is well-developed.  HENT:     Head: Normocephalic and atraumatic.  Eyes:     Conjunctiva/sclera: Conjunctivae normal.  Cardiovascular:     Rate and Rhythm: Normal rate and regular rhythm.     Heart sounds: Normal  heart sounds.  Pulmonary:     Effort: Pulmonary effort is normal.     Breath sounds: Normal breath sounds.  Abdominal:     General: Bowel sounds are normal.     Palpations: Abdomen is soft.  Musculoskeletal:     Cervical back: Normal range of motion.  Lymphadenopathy:     Cervical: No cervical adenopathy.  Skin:    General: Skin is warm and dry.     Capillary Refill: Capillary refill takes less than 2 seconds.  Neurological:     Mental Status: She is alert and oriented to person, place, and time.  Psychiatric:        Behavior: Behavior normal.      Musculoskeletal Exam: She had limited lateral rotation of the cervical spine without discomfort.  There was no tenderness over thoracic or lumbar spine.  Shoulders, elbows, wrist joints with good range of motion.  She had  limited extension of PIP and DIP joints with thickening of PIP and DIP joints.  No synovitis was noted.  Hip joints and knee joints in good range of motion.  There was no tenderness over ankles or MTPs.  Bilateral bunions were noted.  CDAI Exam: CDAI Score: -- Patient Global: --; Provider Global: -- Swollen: --; Tender: -- Joint Exam 01/31/2024   No joint exam has been documented for this visit   There is currently no information documented on the homunculus. Go to the Rheumatology activity and complete the homunculus joint exam.  Investigation: No additional findings.  Imaging: No results found.  Recent Labs: Lab Results  Component Value Date   WBC 5.0 07/21/2023   HGB 10.9 (L) 07/21/2023   PLT 165 07/21/2023   NA 142 10/18/2023   K 4.9 10/18/2023   CL 105 10/18/2023   CO2 29 10/18/2023   GLUCOSE 99 10/18/2023   BUN 26 (H) 10/18/2023   CREATININE 1.35 (H) 10/18/2023   BILITOT 0.8 10/18/2023   ALKPHOS 53 12/29/2021   AST 23 10/18/2023   ALT 16 10/18/2023   PROT 6.9 10/18/2023   ALBUMIN 4.5 12/29/2021   CALCIUM  10.1 10/18/2023   GFRAA 48 (L) 08/20/2020    Speciality Comments: PLQ eye exam: 01/02/2024 normal. [facility] (in [place]). Follow up in 1 year. Prolia  October 2020  Procedures:  No procedures performed Allergies: Patient has no known allergies.   Assessment / Plan:     Visit Diagnoses: Age-related osteoporosis without current pathological fracture - bone density was on October 10, 2018.  Left femoral T score was -2.3, BMD 0.591.  She has been on Prolia  since October 2020.  Her last Prolia  injection was in August 2025.  Patient states she has been getting DEXA scan through Dr. Hazen office.  Very will request a copy of the last DEXA scan.  Next Prolia  will be scheduled in February.  Advised her to get labs prior to her next Prolia  injection.  Vitamin D  deficiency-vitamin D  has been normal.  Vitamin D  was 81 on October 18, 2023.  PMR (polymyalgia rheumatica)-she had polymyalgia rheumatica in the past.  Flares of PMR were noted.  She had good muscle strength.  High risk medication use - Plaquenil  100 mg p.o. daily Monday to Friday.  PLQ eye exam: January 02, 2024.  CMP on October 18, 2023 showed elevated creatinine at 1.85.  Labs are followed by Dr. Nichole.  History of repair of right rotator cuff-doing well.  Chronic left shoulder pain-doing better currently.  Primary osteoarthritis  of both hands-she has severe osteoarthritis in her hands with PIP and DIP thickening and limited extension.  Primary osteoarthritis of both feet-prep for fitting shoes were advised.  DDD (degenerative disc disease), cervical-she has limited range of motion of the cervical spine without discomfort.  Degeneration of intervertebral disc of lumbar region without discogenic back pain or lower extremity pain-she denies discomfort in the lower lumbar region.  Chronic kidney disease, unspecified CKD stage - Followed by Dr. Jerrye.  History of hypertension-blood pressure was 187/80.  Repeat blood pressure was 190/75.  Patient states she has a blood pressure machine at home she will monitor blood pressure at home.  She states her blood pressure is usually high in the physician office.  Night terrors, adult - followed by Dr. Chalice.  Sleep walking  Gait instability  History of gastroesophageal reflux (GERD)  Anxiety and depression  Other headache syndrome  Orders: Orders Placed This Encounter  Procedures   Comprehensive metabolic panel with GFR   CBC with Differential/Platelet   No orders of the defined types were placed in this encounter.    Follow-Up Instructions: Return in about 6 months (around 07/31/2024) for Osteoporosis, Osteoarthritis, Polymyalgia rheumatica.   Maya Nash, MD  Note - This record has been created using Animal nutritionist.  Chart creation errors have been sought, but may not always  have been  located. Such creation errors do not reflect on  the standard of medical care.

## 2024-01-23 ENCOUNTER — Other Ambulatory Visit: Payer: Self-pay | Admitting: Physician Assistant

## 2024-01-24 NOTE — Telephone Encounter (Signed)
 Last Fill: 11/07/2023  Eye exam: 12/01/2022 normal.    Labs: 10/18/2023 Vitamin D  is high normal . Creatinine is elevated.   Next Visit: 01/31/2024  Last Visit: 07/21/2023  DX:PMR   Current Dose per office note 07/21/2023: Plaquenil  100 mg p.o. daily Monday to Friday.  Reached out to patient to advise she is due to update her PLQ eye exam. Patient states she has updated it and will call the eye doctor and have them send results.   Okay to refill Plaquenil ?

## 2024-01-31 ENCOUNTER — Encounter: Payer: Self-pay | Admitting: Rheumatology

## 2024-01-31 ENCOUNTER — Ambulatory Visit: Attending: Rheumatology | Admitting: Rheumatology

## 2024-01-31 VITALS — BP 190/75 | HR 85 | Temp 98.0°F | Resp 13 | Ht 64.0 in | Wt 121.2 lb

## 2024-01-31 DIAGNOSIS — N189 Chronic kidney disease, unspecified: Secondary | ICD-10-CM

## 2024-01-31 DIAGNOSIS — E559 Vitamin D deficiency, unspecified: Secondary | ICD-10-CM

## 2024-01-31 DIAGNOSIS — M19071 Primary osteoarthritis, right ankle and foot: Secondary | ICD-10-CM

## 2024-01-31 DIAGNOSIS — F514 Sleep terrors [night terrors]: Secondary | ICD-10-CM

## 2024-01-31 DIAGNOSIS — M25512 Pain in left shoulder: Secondary | ICD-10-CM

## 2024-01-31 DIAGNOSIS — M51369 Other intervertebral disc degeneration, lumbar region without mention of lumbar back pain or lower extremity pain: Secondary | ICD-10-CM

## 2024-01-31 DIAGNOSIS — G4489 Other headache syndrome: Secondary | ICD-10-CM

## 2024-01-31 DIAGNOSIS — R2681 Unsteadiness on feet: Secondary | ICD-10-CM

## 2024-01-31 DIAGNOSIS — Z79899 Other long term (current) drug therapy: Secondary | ICD-10-CM | POA: Diagnosis not present

## 2024-01-31 DIAGNOSIS — G8929 Other chronic pain: Secondary | ICD-10-CM

## 2024-01-31 DIAGNOSIS — M19072 Primary osteoarthritis, left ankle and foot: Secondary | ICD-10-CM

## 2024-01-31 DIAGNOSIS — Z9889 Other specified postprocedural states: Secondary | ICD-10-CM | POA: Diagnosis not present

## 2024-01-31 DIAGNOSIS — M810 Age-related osteoporosis without current pathological fracture: Secondary | ICD-10-CM

## 2024-01-31 DIAGNOSIS — Z8679 Personal history of other diseases of the circulatory system: Secondary | ICD-10-CM

## 2024-01-31 DIAGNOSIS — M19041 Primary osteoarthritis, right hand: Secondary | ICD-10-CM

## 2024-01-31 DIAGNOSIS — M353 Polymyalgia rheumatica: Secondary | ICD-10-CM | POA: Diagnosis not present

## 2024-01-31 DIAGNOSIS — M5030 Other cervical disc degeneration, unspecified cervical region: Secondary | ICD-10-CM | POA: Diagnosis not present

## 2024-01-31 DIAGNOSIS — F32A Depression, unspecified: Secondary | ICD-10-CM

## 2024-01-31 DIAGNOSIS — M19042 Primary osteoarthritis, left hand: Secondary | ICD-10-CM

## 2024-01-31 DIAGNOSIS — F513 Sleepwalking [somnambulism]: Secondary | ICD-10-CM

## 2024-01-31 DIAGNOSIS — Z8719 Personal history of other diseases of the digestive system: Secondary | ICD-10-CM

## 2024-01-31 DIAGNOSIS — F419 Anxiety disorder, unspecified: Secondary | ICD-10-CM

## 2024-01-31 NOTE — Patient Instructions (Addendum)
 Return in February for your labs prior to the Prolia  injection  Vaccines You are taking a medication(s) that can suppress your immune system.  The following immunizations are recommended: Flu annually RSV Covid-19  Td/Tdap (tetanus, diphtheria, pertussis) every 10 years Pneumonia (Prevnar 15 then Pneumovax 23 at least 1 year apart.  Alternatively, can take Prevnar 20 without needing additional dose) Shingrix: 2 doses from 4 weeks to 6 months apart  Please check with your PCP to make sure you are up to date.

## 2024-02-01 ENCOUNTER — Encounter (HOSPITAL_COMMUNITY): Payer: Self-pay | Admitting: Pharmacist

## 2024-02-01 ENCOUNTER — Telehealth: Payer: Self-pay | Admitting: Pharmacist

## 2024-02-01 NOTE — Telephone Encounter (Signed)
 error

## 2024-02-01 NOTE — Telephone Encounter (Addendum)
 Please notify pharmacy pool once labs are updated. If she is delayed in updating labs, may need to have provider place infusion orders (if I am no longer covering)  Sherry Pennant, PharmD, MPH, BCPS, CPP Clinical Pharmacist [facility])  ----- Message from [facility] Marissa G sent at 01/31/2024  3:05 PM EST ----- Patient is due for prolia  in February 2026, per Dr. Dolphus. I have also called to obtain a copy of the most recent DEXA scan. Thanks!

## 2024-02-02 ENCOUNTER — Telehealth: Payer: Self-pay

## 2024-02-02 NOTE — Telephone Encounter (Signed)
 Received DEXA results from [facility].  Date of Scan: 07/14/2023  Lowest T-score: -2.3  BMD: 0.596  Lowest site measured: left femoral neck   DX: osteopenia  Current Regimen: Prolia , last injection 10/25/2023, calcium    Recommendation: continue current treatment   Reviewed by: Dr. Dolphus  Next Appointment:  08/01/2024  Called patient and advised of recommendations. Sent copy of DEXA to the scan center.

## 2024-02-27 ENCOUNTER — Telehealth: Payer: Self-pay | Admitting: Neurology

## 2024-02-27 NOTE — Telephone Encounter (Signed)
 I called the patient to get more information on the previous note. Patient state she has not been blacking out. She feels like she is losing time she wakes up in the morning sometimes around 3am or 6am and after falling back asleep wakes up around 6:30 or 9:00 she is not sure if it is morning or night time.   - she lives in assisted living and is unable to drive or go to the hospital until Wednesday if she is needed to go.   Patient has had BP issues in the past but her current BP 90-102 for the top number and 53-73 for the bottom. He pulse has been 88-89 consistently. She takes her BP standing per her cardiologist.   Please advise if patient needs to be seen before February or should go to the hospital for losing time concerns. She states dinner time is at 5pm and to leave a detailed message if we call around that time.

## 2024-02-27 NOTE — Telephone Encounter (Signed)
 Pt is asking for a call to discuss something that started a few weeks ago and according to her is something becoming more frequent.  Pt states She has been waking up very early in the morning and before she knows it hours have gone by.  Pt states she has not been blacking out or anything, she is unable to reach PCP which is on vacation, but she would like to hear from Dr Chalice on what she thinks.

## 2024-02-29 NOTE — Telephone Encounter (Signed)
 Patient already takes half of the klonopin  did you want her to take 1/4 of the medication? Patient plans to go to urgent care today to get her BP checked out and makes sure she is not dehydrated. Her nurse comes in at 9am and will be able to take her.

## 2024-02-29 NOTE — Telephone Encounter (Signed)
 I called the patient and went over the note by Dr. Chalice. Patient will continue her current dose. I moved her appointment to 03/28/24 at 8:30am.  Patient went to urgent care and sh is not dehydrated which is good. Her BP sitting was 203/99 and Standing 152/72. She states she has medication that is based off her BP and she did not take anything this morning.  Per her cardiologist she is only supposed to go by her standing BP. And urgent care did not find either readings concerning.

## 2024-03-03 ENCOUNTER — Other Ambulatory Visit: Payer: Self-pay | Admitting: Cardiology

## 2024-03-03 DIAGNOSIS — I10 Essential (primary) hypertension: Secondary | ICD-10-CM

## 2024-03-16 ENCOUNTER — Telehealth (HOSPITAL_COMMUNITY): Payer: Self-pay | Admitting: Pharmacy Technician

## 2024-03-16 NOTE — Telephone Encounter (Signed)
 Auth Submission: APPROVED Site of care: [facility] Payer: [employer] MEDICARE Medication & CPT/J Code(s) submitted: Prolia  (Denosumab ) [id number] Diagnosis Code: M81.0 Route of submission (phone, fax, portal): CMM Key: [id number] Phone # Fax # Auth type: Buy/Bill HB Units/visits requested: 60mg  x 2 doses, q 6 months Reference number: [id number] Approval from: 06/08/23 to 02/28/25    Dagoberto Armour, CPhT [facility] Phone: [phone] 03/16/2024

## 2024-03-28 ENCOUNTER — Encounter: Payer: Self-pay | Admitting: Neurology

## 2024-03-28 ENCOUNTER — Ambulatory Visit (INDEPENDENT_AMBULATORY_CARE_PROVIDER_SITE_OTHER): Admitting: Neurology

## 2024-03-28 ENCOUNTER — Ambulatory Visit: Admitting: Neurology

## 2024-03-28 VITALS — BP 148/82 | HR 93 | Ht 64.0 in | Wt 120.8 lb

## 2024-03-28 DIAGNOSIS — F514 Sleep terrors [night terrors]: Secondary | ICD-10-CM | POA: Diagnosis not present

## 2024-03-28 MED ORDER — CLONAZEPAM 0.5 MG PO TABS: 0.2500 mg | ORAL_TABLET | Freq: Every day | ORAL | 5 refills | Status: DC

## 2024-03-28 MED ORDER — CLONAZEPAM 0.5 MG PO TABS: 0.2500 mg | ORAL_TABLET | Freq: Every day | ORAL | 3 refills | Status: AC

## 2024-03-28 NOTE — Patient Instructions (Signed)
 Rv in 6 months with MOCA or MMSE, following ADL.

## 2024-03-28 NOTE — Progress Notes (Signed)
 "        Provider:  Dedra Gores, MD  Primary Care Physician:  Nichole Senior, MD [address] [place] [address]     Referring Provider: Nichole Senior, Md [address] [place],  [place] [address]          Chief Complaint according to patient   Patient presents with:                HISTORY OF PRESENT ILLNESS:  Gloria Anderson is a 89 y.o. female patient who is here for revisit 03/28/2024 for memory, and for her long standing night terrors. Cognitive function has been mildly impaired.  She is not tested today, (MMSE was ordered ) as she got here so very late ( weather ).   She has lost her sister in 2025, her only living relative in [place], which makes her sad. She has been dreaming more vivdly again. Hearing screams, animal or human voices,  but she reports its not scary . She  spent the last week over the snow storm at her sons house and met the news est family member , just 14 days old, her second great-grandchild.   Interval medical history: on Prolia  for osteoporosis.    Chief concern according to patient :  She denies any new problems. Her notes speak of Hypotension spells when standing, high Blood pressure when sitting.  Dr Ladona, MD, has treated this condition and she reports he  feels she is best controlled.     Fam Hx : see previous note Social HX; see previous note     Review of Systems: Out of a complete 14 system review, the patient complains of only the following symptoms, and all other reviewed systems are negative.:      09/01/2022    1:13 PM 05/10/2022    2:53 PM 11/16/2021    2:44 PM  MMSE - Mini Mental State Exam  Orientation to time 4 5 5   Orientation to Place 5 5 5   Registration 3 3 3   Attention/ Calculation 3 4 5   Recall 2 3 2   Language- name 2 objects 2 2 2   Language- repeat 1 1 1   Language- follow 3 step command 3 3 3   Language- read & follow direction 1 1 1   Write a sentence 1 1 1   Copy design 1 0 0  Total score 26 28 28      .MOCA      Social History   Socioeconomic History   Marital status: Widowed    Spouse name: Not on file   Number of children: 2   Years of education: HS   Highest education level: Not on file  Occupational History   Occupation: RETIRED    Employer: [employer]  Tobacco Use   Smoking status: Never    Passive exposure: Never   Smokeless tobacco: Never  Vaping Use   Vaping status: Never Used  Substance and Sexual Activity   Alcohol  use: No    Alcohol /week: 0.0 standard drinks of alcohol    Drug use: Never   Sexual activity: Not on file  Other Topics Concern   Not on file  Social History Narrative   Patient is widowed.   Patient has two children.   Patient does not drink any caffeine.    Patient has a high school education.   Patient is right-handed.            Social Drivers of Health   Tobacco Use: Low Risk (03/28/2024)  Patient History    Smoking Tobacco Use: Never    Smokeless Tobacco Use: Never    Passive Exposure: Never  Financial Resource Strain: Not on file  Food Insecurity: Not on file  Transportation Needs: Not on file  Physical Activity: Not on file  Stress: Not on file  Social Connections: Not on file  Depression (PHQ2-9): Not on file  Alcohol  Screen: Not on file  Housing: Not on file  Utilities: Not on file  Health Literacy: Not on file    Family History  Problem Relation Age of Onset   Tuberculosis Mother 6   CVA Father    Stroke Father 61   Hypertension Sister 57   Lung cancer Brother 33   Hypertension Son     Past Medical History:  Diagnosis Date   Arthritis    Complication of anesthesia    Constipation    Depression    GERD (gastroesophageal reflux disease)    Hypertension    Hypothyroidism    Osteoporosis    Pneumonia 2018   Polymyalgia    Polymyalgia rheumatica    PONV (postoperative nausea and vomiting)     Past Surgical History:  Procedure Laterality Date   BASAL CELL CARCINOMA EXCISION  2022   on face    COLONOSCOPY W/ POLYPECTOMY     EYE SURGERY     both cataracts   LUMBAR LAMINECTOMY/DECOMPRESSION MICRODISCECTOMY N/A 08/15/2018   Procedure: Lumbar three to Lumbar five Decompressive lumbar laminectomy;  Surgeon: Unice Pac, MD;  Location: [facility] OR;  Service: Neurosurgery;  Laterality: N/A;   NASAL SINUS SURGERY     SHOULDER SURGERY Right 2007   repair   TEAR DUCT PROBING Bilateral    TONSILLECTOMY     TRIGGER FINGER RELEASE  03/19/2011   Procedure:  left middle finger RELEASE TRIGGER FINGER/A-1 PULLEY;  Surgeon: Lamar LULLA Leonor Mickey., MD;  Location: [facility];  Service: Orthopedics;  Laterality: Right;  Procedure:  Release Right Long and Ring Trigger Fingers, Release Left Long Trigger Finger, Injection Left Long Proximal Phalangeal Joint   TRIGGER FINGER RELEASE Right    Ring finger, middle finger   UPPER GASTROINTESTINAL ENDOSCOPY       Medications Ordered Prior to Encounter[1]  Allergies[2]   DIAGNOSTIC DATA (LABS, IMAGING, TESTING) - I reviewed patient records, labs, notes, testing and imaging myself where available.  Lab Results  Component Value Date   WBC 5.0 07/21/2023   HGB 10.9 (L) 07/21/2023   HCT 32.8 (L) 07/21/2023   MCV 100.3 (H) 07/21/2023   PLT 165 07/21/2023      Component Value Date/Time   NA 142 10/18/2023 0000   NA 137 12/29/2021 1450   K 4.9 10/18/2023 0000   CL 105 10/18/2023 0000   CO2 29 10/18/2023 0000   GLUCOSE 99 10/18/2023 0000   BUN 26 (H) 10/18/2023 0000   BUN 21 12/29/2021 1450   CREATININE 1.35 (H) 10/18/2023 0000   CALCIUM  10.1 10/18/2023 0000   PROT 6.9 10/18/2023 0000   PROT 6.9 12/29/2021 1450   ALBUMIN 4.5 12/29/2021 1450   AST 23 10/18/2023 0000   ALT 16 10/18/2023 0000   ALKPHOS 53 12/29/2021 1450   BILITOT 0.8 10/18/2023 0000   BILITOT 0.6 12/29/2021 1450   GFRNONAA 30 (L) 05/16/2021 1817   GFRNONAA 41 (L) 08/20/2020 1544   GFRAA 48 (L) 08/20/2020 1544   No results found for: CHOL, HDL, LDLCALC,  LDLDIRECT, TRIG, CHOLHDL No results found for: YHAJ8R Lab Results  Component Value Date   VITAMINB12 1,421 (H) 05/17/2021   Lab Results  Component Value Date   TSH 1.243 05/17/2021    PHYSICAL EXAM:  Vitals:   03/28/24 0856 03/28/24 0857  BP: (!) 189/99 (!) 148/82  Pulse: 99 93   No data found. Body mass index is 20.74 kg/m.   Wt Readings from Last 3 Encounters:  03/28/24 120 lb 12.8 oz (54.8 kg)  01/31/24 121 lb 3.2 oz (55 kg)  10/25/23 117 lb 9.6 oz (53.3 kg)     Ht Readings from Last 3 Encounters:  03/28/24 5' 4 (1.626 m)  01/31/24 5' 4 (1.626 m)  10/25/23 5' 4 (1.626 m)     BP high !!   General: The patient is awake, alert and appears not in acute distress and groomed. Head: Normocephalic, atraumatic.  Neck is supple.  Dental status:  Cardiovascular:  Regular rate and cardiac rhythm by pulse, without distended neck veins. Respiratory: no shortness of breath  Skin:  Without evidence of ankle edema, or rash. Trunk: BMI is 20.7    NEUROLOGIC EXAM: The patient is awake and alert, oriented to place and time.   Memory subjective described as intact.  Attention span & concentration ability appears normal.   Speech is fluent,  without  dysarthria, but known dysphonia and aphasia.  Mood and affect are appropriate.   Neurological Examination: Mental Status: Intact. Language: she hesitates, has  some fluency impairment, not a semantic disturbancy,  Mild  cognitive deficits.  Cranial Nerves II-XII: Intact. PERL. EOMI. VFF. No nystagmus.  No facial droop.  No ptosis.  Hearing is grossly intact bilaterally.  The tongue is normal and midline. Motor: Strengths are 5/5 throughout. Muscle bulk and tone are normal. No tremors.  Coordination: No ataxia or dysmetria.  Sensory: Grossly intact throughout to all modalities. Reflexes: Brisk and symmetric throughout.  No ankle clonus. Babinski's sign is absent bilaterally.  Gait and Station: slower than a year  ago.   ASSESSMENT AND PLAN :   89 y.o. year old female  here with:    1) Night terrors have been a bit  a little more frequent but she is not terrified by these.  Continue Klonopin .   2)  MMSE  consistent with MCI.   3) Orthostatic hypotension. Dr ladona.   4) Plaquenil  for PMD, Dr Dolphus.    Rv in 6 months with MOCA or MMSE, following ADL.      I would like to thank Nichole Senior, MD and Dr Ladona for allowing me to meet with this pleasant patient.   Sleep Clinic Patients are generally offered input on sleep hygiene, life style changes and how to improve compliance with medical treatment where applicable. Review and reiteration of good sleep hygiene measures is offered to any sleep clinic patient, be it in the first consultation or with any follow up visits.    Any patient with sleepiness should be cautioned not to drive, work at heights, or operate dangerous or heavy equipment when feeling tired or sleepy.      The patient will be seen in follow-up in the sleep clinic at [facility] for discussion of test results, sleep related symptoms and treatment compliance review, further management strategies, etc.   The referring provider will be notified of the test results.   The patient's condition requires frequent monitoring and adjustments in the treatment plan, reflecting the ongoing complexity of care.  This provider is the continuing focal point for all needed services for this  condition.  After spending a total time of  40  minutes face to face and time for  history taking, physical and neurologic examination, review of laboratory studies,  personal review of imaging studies, reports and results of other testing and review of referral information / records as far as provided in visit,   Electronically signed by: Dedra Gores, MD 03/28/2024 9:20 AM  [facility] Board certified by The [employer] of Sleep Medicine and Diplomate of the  [employer]. Board certified In Neurology through the ABPN, Fellow of the [employer] of [employer].      [1]  Current Outpatient Medications on File Prior to Visit  Medication Sig Dispense Refill   acetaminophen  (TYLENOL ) 325 MG tablet Take 325 mg by mouth every 6 (six) hours as needed for moderate pain or headache.     atorvastatin  (LIPITOR) 10 MG tablet Take 10 mg by mouth every evening.     BIOTIN  FORTE PO Take 1 capsule by mouth daily.     Calcium  Citrate (CITRACAL PO) Take 2 tablets by mouth in the morning and at bedtime.     carvedilol  (COREG ) 3.125 MG tablet TAKE 1 TABLET TWICE DAILY AS NEEDED FOR STANDING BLOOD PRESSURE GREATER THAN 150 MMHG 180 tablet 1   clonazePAM  (KLONOPIN ) 0.5 MG tablet Take 0.5 tablets (0.25 mg total) by mouth at bedtime. 30 tablet 2   denosumab  (PROLIA ) 60 MG/ML SOSY injection Inject 60 mg into the skin every 6 (six) months. Courier to rheum: [address], [place] [address]. Appt on 11/09/2022 1 mL 0   hydrALAZINE  (APRESOLINE ) 25 MG tablet TAKE 1 TABLET (25 MG TOTAL) BY MOUTH 3 (THREE) TIMES DAILY AS NEEDED (STANDING BP >140 MM HG). 270 tablet 2   hydroxychloroquine  (PLAQUENIL ) 200 MG tablet TAKE 1 TABLET EVERY OTHER DAY (Patient taking differently: Take 100 mg by mouth every other day.) 45 tablet 3   levothyroxine  (SYNTHROID , LEVOTHROID) 50 MCG tablet Take 50 mcg by mouth daily before breakfast.      Multiple Vitamin (MULTIVITAMIN PO) Take 1 tablet by mouth daily.     omeprazole (PRILOSEC) 20 MG capsule Take 20 mg by mouth daily.     OVER THE COUNTER MEDICATION Take 2 tablets by mouth in the morning and at bedtime. Calcium  mini     polyethylene glycol (MIRALAX  / GLYCOLAX ) packet Take 17 g by mouth daily as needed for moderate constipation.      Propylene Glycol (SYSTANE BALANCE OP) Apply 1 drop to eye as needed.     No current facility-administered medications on file prior to visit.  [2] No Known Allergies  "

## 2024-04-05 NOTE — Telephone Encounter (Signed)
 Contacted patient to update labs and provided lab hours. Patient says she will be in on 04/11/2024 between 1-2PM

## 2024-04-10 ENCOUNTER — Ambulatory Visit: Admitting: Neurology

## 2024-04-27 ENCOUNTER — Inpatient Hospital Stay (HOSPITAL_COMMUNITY): Admission: RE | Admit: 2024-04-27 | Source: Ambulatory Visit

## 2024-08-01 ENCOUNTER — Ambulatory Visit: Admitting: Rheumatology

## 2024-09-20 ENCOUNTER — Ambulatory Visit: Admitting: Neurology
